# Patient Record
Sex: Male | Born: 1940 | Race: White | Hispanic: No | Marital: Single | Smoking: Never smoker
Health system: Southern US, Community
[De-identification: ages and names within clinical notes are randomized; demographics above are authoritative.]

## PROBLEM LIST (undated history)

## (undated) DIAGNOSIS — C4491 Basal cell carcinoma of skin, unspecified: Secondary | ICD-10-CM

## (undated) DIAGNOSIS — E039 Hypothyroidism, unspecified: Secondary | ICD-10-CM

## (undated) DIAGNOSIS — Z8589 Personal history of malignant neoplasm of other organs and systems: Secondary | ICD-10-CM

## (undated) DIAGNOSIS — E349 Endocrine disorder, unspecified: Secondary | ICD-10-CM

## (undated) DIAGNOSIS — E785 Hyperlipidemia, unspecified: Secondary | ICD-10-CM

## (undated) DIAGNOSIS — M542 Cervicalgia: Secondary | ICD-10-CM

## (undated) DIAGNOSIS — I4891 Unspecified atrial fibrillation: Secondary | ICD-10-CM

## (undated) DIAGNOSIS — I341 Nonrheumatic mitral (valve) prolapse: Secondary | ICD-10-CM

## (undated) DIAGNOSIS — R55 Syncope and collapse: Secondary | ICD-10-CM

## (undated) DIAGNOSIS — Z8679 Personal history of other diseases of the circulatory system: Secondary | ICD-10-CM

## (undated) DIAGNOSIS — I714 Abdominal aortic aneurysm, without rupture: Secondary | ICD-10-CM

## (undated) DIAGNOSIS — K219 Gastro-esophageal reflux disease without esophagitis: Secondary | ICD-10-CM

## (undated) DIAGNOSIS — F411 Generalized anxiety disorder: Secondary | ICD-10-CM

## (undated) DIAGNOSIS — R140 Abdominal distension (gaseous): Secondary | ICD-10-CM

## (undated) DIAGNOSIS — S129XXA Fracture of neck, unspecified, initial encounter: Secondary | ICD-10-CM

## (undated) DIAGNOSIS — H8110 Benign paroxysmal vertigo, unspecified ear: Secondary | ICD-10-CM

## (undated) DIAGNOSIS — C44529 Squamous cell carcinoma of skin of other part of trunk: Secondary | ICD-10-CM

## (undated) DIAGNOSIS — N529 Male erectile dysfunction, unspecified: Secondary | ICD-10-CM

## (undated) DIAGNOSIS — K5790 Diverticulosis of intestine, part unspecified, without perforation or abscess without bleeding: Secondary | ICD-10-CM

## (undated) DIAGNOSIS — Z0000 Encounter for general adult medical examination without abnormal findings: Secondary | ICD-10-CM

## (undated) DIAGNOSIS — I2510 Atherosclerotic heart disease of native coronary artery without angina pectoris: Secondary | ICD-10-CM

## (undated) DIAGNOSIS — K449 Diaphragmatic hernia without obstruction or gangrene: Secondary | ICD-10-CM

## (undated) HISTORY — DX: Personal history of other diseases of the circulatory system: Z86.79

## (undated) HISTORY — DX: Abdominal aortic aneurysm, without rupture: I71.4

## (undated) HISTORY — DX: Endocrine disorder, unspecified: E34.9

## (undated) HISTORY — DX: Abdominal distension (gaseous): R14.0

## (undated) HISTORY — PX: KNEE ARTHROPLASTY: SHX992

## (undated) HISTORY — DX: Generalized anxiety disorder: F41.1

## (undated) HISTORY — DX: Male erectile dysfunction, unspecified: N52.9

## (undated) HISTORY — DX: Encounter for general adult medical examination without abnormal findings: Z00.00

## (undated) HISTORY — DX: Syncope and collapse: R55

## (undated) HISTORY — DX: Benign paroxysmal vertigo, unspecified ear: H81.10

## (undated) HISTORY — DX: Cervicalgia: M54.2

## (undated) HISTORY — DX: Basal cell carcinoma of skin, unspecified: C44.91

## (undated) HISTORY — DX: Personal history of malignant neoplasm of other organs and systems: Z85.89

---
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
## 1978-06-20 DIAGNOSIS — S129XXA Fracture of neck, unspecified, initial encounter: Secondary | ICD-10-CM

## 1978-06-20 HISTORY — DX: Fracture of neck, unspecified, initial encounter: S12.9XXA

## 2005-10-28 ENCOUNTER — Emergency Department (HOSPITAL_COMMUNITY): Admission: EM | Admit: 2005-10-28 | Discharge: 2005-10-28 | Payer: Self-pay | Admitting: Family Medicine

## 2005-11-23 ENCOUNTER — Emergency Department (HOSPITAL_COMMUNITY): Admission: EM | Admit: 2005-11-23 | Discharge: 2005-11-23 | Payer: Self-pay | Admitting: Family Medicine

## 2006-03-27 ENCOUNTER — Emergency Department (HOSPITAL_COMMUNITY): Admission: EM | Admit: 2006-03-27 | Discharge: 2006-03-27 | Payer: Self-pay | Admitting: Emergency Medicine

## 2007-01-03 ENCOUNTER — Encounter: Payer: Self-pay | Admitting: Cardiovascular Disease

## 2007-01-08 ENCOUNTER — Encounter: Payer: Self-pay | Admitting: Cardiovascular Disease

## 2009-08-11 ENCOUNTER — Encounter: Payer: Self-pay | Admitting: Cardiovascular Disease

## 2010-07-21 HISTORY — PX: INGUINAL HERNIA REPAIR: SUR1180

## 2010-09-19 DIAGNOSIS — C44529 Squamous cell carcinoma of skin of other part of trunk: Secondary | ICD-10-CM

## 2010-09-19 HISTORY — DX: Squamous cell carcinoma of skin of other part of trunk: C44.529

## 2010-10-05 DIAGNOSIS — Z8589 Personal history of malignant neoplasm of other organs and systems: Secondary | ICD-10-CM

## 2010-10-05 HISTORY — DX: Personal history of malignant neoplasm of other organs and systems: Z85.89

## 2011-05-21 HISTORY — PX: APPENDECTOMY: SHX54

## 2011-07-12 DIAGNOSIS — M1710 Unilateral primary osteoarthritis, unspecified knee: Secondary | ICD-10-CM | POA: Diagnosis not present

## 2011-07-12 DIAGNOSIS — IMO0002 Reserved for concepts with insufficient information to code with codable children: Secondary | ICD-10-CM | POA: Diagnosis not present

## 2011-07-12 DIAGNOSIS — M9420 Chondromalacia, unspecified site: Secondary | ICD-10-CM | POA: Diagnosis not present

## 2011-07-20 DIAGNOSIS — IMO0002 Reserved for concepts with insufficient information to code with codable children: Secondary | ICD-10-CM | POA: Diagnosis not present

## 2011-07-20 DIAGNOSIS — M25569 Pain in unspecified knee: Secondary | ICD-10-CM | POA: Diagnosis not present

## 2011-07-20 DIAGNOSIS — M1710 Unilateral primary osteoarthritis, unspecified knee: Secondary | ICD-10-CM | POA: Diagnosis not present

## 2011-08-10 DIAGNOSIS — IMO0002 Reserved for concepts with insufficient information to code with codable children: Secondary | ICD-10-CM | POA: Diagnosis not present

## 2011-08-10 DIAGNOSIS — M1710 Unilateral primary osteoarthritis, unspecified knee: Secondary | ICD-10-CM | POA: Diagnosis not present

## 2011-08-17 DIAGNOSIS — IMO0002 Reserved for concepts with insufficient information to code with codable children: Secondary | ICD-10-CM | POA: Diagnosis not present

## 2011-08-17 DIAGNOSIS — M1710 Unilateral primary osteoarthritis, unspecified knee: Secondary | ICD-10-CM | POA: Diagnosis not present

## 2011-10-05 DIAGNOSIS — E7800 Pure hypercholesterolemia, unspecified: Secondary | ICD-10-CM | POA: Diagnosis not present

## 2011-10-05 DIAGNOSIS — R5381 Other malaise: Secondary | ICD-10-CM | POA: Diagnosis not present

## 2011-10-05 DIAGNOSIS — E039 Hypothyroidism, unspecified: Secondary | ICD-10-CM | POA: Diagnosis not present

## 2011-10-05 DIAGNOSIS — Z125 Encounter for screening for malignant neoplasm of prostate: Secondary | ICD-10-CM | POA: Diagnosis not present

## 2011-10-05 DIAGNOSIS — R5383 Other fatigue: Secondary | ICD-10-CM | POA: Diagnosis not present

## 2011-10-05 DIAGNOSIS — Z79899 Other long term (current) drug therapy: Secondary | ICD-10-CM | POA: Diagnosis not present

## 2011-10-05 DIAGNOSIS — Z1211 Encounter for screening for malignant neoplasm of colon: Secondary | ICD-10-CM | POA: Diagnosis not present

## 2011-10-18 DIAGNOSIS — K921 Melena: Secondary | ICD-10-CM | POA: Diagnosis not present

## 2011-10-18 DIAGNOSIS — K219 Gastro-esophageal reflux disease without esophagitis: Secondary | ICD-10-CM | POA: Diagnosis not present

## 2011-11-04 DIAGNOSIS — R51 Headache: Secondary | ICD-10-CM | POA: Diagnosis not present

## 2011-11-04 DIAGNOSIS — H2510 Age-related nuclear cataract, unspecified eye: Secondary | ICD-10-CM | POA: Diagnosis not present

## 2011-11-04 DIAGNOSIS — H43819 Vitreous degeneration, unspecified eye: Secondary | ICD-10-CM | POA: Diagnosis not present

## 2011-11-04 DIAGNOSIS — M316 Other giant cell arteritis: Secondary | ICD-10-CM | POA: Diagnosis not present

## 2011-11-04 DIAGNOSIS — H5710 Ocular pain, unspecified eye: Secondary | ICD-10-CM | POA: Diagnosis not present

## 2011-11-16 DIAGNOSIS — Z800 Family history of malignant neoplasm of digestive organs: Secondary | ICD-10-CM | POA: Diagnosis not present

## 2011-11-16 DIAGNOSIS — K648 Other hemorrhoids: Secondary | ICD-10-CM | POA: Diagnosis not present

## 2011-11-16 DIAGNOSIS — K5730 Diverticulosis of large intestine without perforation or abscess without bleeding: Secondary | ICD-10-CM | POA: Diagnosis not present

## 2011-11-16 DIAGNOSIS — E039 Hypothyroidism, unspecified: Secondary | ICD-10-CM | POA: Diagnosis not present

## 2011-11-16 DIAGNOSIS — K219 Gastro-esophageal reflux disease without esophagitis: Secondary | ICD-10-CM | POA: Diagnosis not present

## 2011-11-16 DIAGNOSIS — Z86010 Personal history of colonic polyps: Secondary | ICD-10-CM | POA: Diagnosis not present

## 2011-11-16 DIAGNOSIS — K921 Melena: Secondary | ICD-10-CM | POA: Diagnosis not present

## 2011-11-16 DIAGNOSIS — R195 Other fecal abnormalities: Secondary | ICD-10-CM | POA: Diagnosis not present

## 2011-11-22 DIAGNOSIS — Z0000 Encounter for general adult medical examination without abnormal findings: Secondary | ICD-10-CM | POA: Diagnosis not present

## 2011-11-22 DIAGNOSIS — R5381 Other malaise: Secondary | ICD-10-CM | POA: Diagnosis not present

## 2011-11-22 DIAGNOSIS — K219 Gastro-esophageal reflux disease without esophagitis: Secondary | ICD-10-CM | POA: Diagnosis not present

## 2011-11-22 DIAGNOSIS — R51 Headache: Secondary | ICD-10-CM | POA: Diagnosis not present

## 2011-11-22 DIAGNOSIS — E7800 Pure hypercholesterolemia, unspecified: Secondary | ICD-10-CM | POA: Diagnosis not present

## 2011-11-29 ENCOUNTER — Encounter: Payer: Self-pay | Admitting: Cardiovascular Disease

## 2011-11-29 DIAGNOSIS — R5383 Other fatigue: Secondary | ICD-10-CM | POA: Diagnosis not present

## 2011-11-29 DIAGNOSIS — R079 Chest pain, unspecified: Secondary | ICD-10-CM | POA: Diagnosis not present

## 2011-11-29 DIAGNOSIS — I4891 Unspecified atrial fibrillation: Secondary | ICD-10-CM | POA: Diagnosis not present

## 2011-11-29 DIAGNOSIS — R9439 Abnormal result of other cardiovascular function study: Secondary | ICD-10-CM | POA: Diagnosis not present

## 2011-11-29 DIAGNOSIS — K5730 Diverticulosis of large intestine without perforation or abscess without bleeding: Secondary | ICD-10-CM | POA: Diagnosis not present

## 2011-11-29 DIAGNOSIS — R5381 Other malaise: Secondary | ICD-10-CM | POA: Diagnosis not present

## 2011-11-29 DIAGNOSIS — K219 Gastro-esophageal reflux disease without esophagitis: Secondary | ICD-10-CM | POA: Diagnosis not present

## 2011-11-29 DIAGNOSIS — I2510 Atherosclerotic heart disease of native coronary artery without angina pectoris: Secondary | ICD-10-CM | POA: Diagnosis not present

## 2011-11-29 DIAGNOSIS — D509 Iron deficiency anemia, unspecified: Secondary | ICD-10-CM | POA: Diagnosis not present

## 2011-11-30 ENCOUNTER — Encounter: Payer: Self-pay | Admitting: Cardiovascular Disease

## 2011-11-30 DIAGNOSIS — R9431 Abnormal electrocardiogram [ECG] [EKG]: Secondary | ICD-10-CM | POA: Diagnosis not present

## 2011-11-30 DIAGNOSIS — I4891 Unspecified atrial fibrillation: Secondary | ICD-10-CM | POA: Diagnosis not present

## 2011-12-02 ENCOUNTER — Encounter: Payer: Self-pay | Admitting: Cardiovascular Disease

## 2011-12-02 DIAGNOSIS — I2510 Atherosclerotic heart disease of native coronary artery without angina pectoris: Secondary | ICD-10-CM | POA: Diagnosis not present

## 2011-12-02 DIAGNOSIS — R9439 Abnormal result of other cardiovascular function study: Secondary | ICD-10-CM | POA: Diagnosis not present

## 2011-12-02 DIAGNOSIS — I4891 Unspecified atrial fibrillation: Secondary | ICD-10-CM | POA: Diagnosis not present

## 2011-12-02 DIAGNOSIS — R079 Chest pain, unspecified: Secondary | ICD-10-CM | POA: Diagnosis not present

## 2011-12-03 DIAGNOSIS — I4891 Unspecified atrial fibrillation: Secondary | ICD-10-CM | POA: Diagnosis not present

## 2011-12-07 ENCOUNTER — Encounter: Payer: Self-pay | Admitting: Cardiovascular Disease

## 2011-12-07 DIAGNOSIS — I4891 Unspecified atrial fibrillation: Secondary | ICD-10-CM | POA: Diagnosis not present

## 2011-12-07 DIAGNOSIS — I2510 Atherosclerotic heart disease of native coronary artery without angina pectoris: Secondary | ICD-10-CM | POA: Diagnosis not present

## 2011-12-09 ENCOUNTER — Encounter: Payer: Self-pay | Admitting: Cardiovascular Disease

## 2011-12-12 DIAGNOSIS — I4891 Unspecified atrial fibrillation: Secondary | ICD-10-CM | POA: Diagnosis not present

## 2011-12-30 DIAGNOSIS — R079 Chest pain, unspecified: Secondary | ICD-10-CM | POA: Diagnosis not present

## 2011-12-30 DIAGNOSIS — R002 Palpitations: Secondary | ICD-10-CM | POA: Diagnosis not present

## 2012-01-02 ENCOUNTER — Encounter (HOSPITAL_COMMUNITY): Payer: Self-pay | Admitting: Emergency Medicine

## 2012-01-02 ENCOUNTER — Observation Stay (HOSPITAL_COMMUNITY)
Admission: EM | Admit: 2012-01-02 | Discharge: 2012-01-03 | Disposition: A | Discharge status: Home / Self Care | Payer: Medicare Other | Attending: Cardiovascular Disease | Admitting: Cardiovascular Disease

## 2012-01-02 ENCOUNTER — Encounter: Payer: Self-pay | Admitting: Cardiovascular Disease

## 2012-01-02 ENCOUNTER — Emergency Department (HOSPITAL_COMMUNITY): Payer: Medicare Other

## 2012-01-02 ENCOUNTER — Other Ambulatory Visit: Payer: Self-pay

## 2012-01-02 DIAGNOSIS — E785 Hyperlipidemia, unspecified: Secondary | ICD-10-CM | POA: Diagnosis not present

## 2012-01-02 DIAGNOSIS — K219 Gastro-esophageal reflux disease without esophagitis: Secondary | ICD-10-CM

## 2012-01-02 DIAGNOSIS — R0602 Shortness of breath: Secondary | ICD-10-CM | POA: Diagnosis not present

## 2012-01-02 DIAGNOSIS — I4891 Unspecified atrial fibrillation: Secondary | ICD-10-CM | POA: Diagnosis not present

## 2012-01-02 DIAGNOSIS — I059 Rheumatic mitral valve disease, unspecified: Secondary | ICD-10-CM | POA: Diagnosis not present

## 2012-01-02 DIAGNOSIS — Z8679 Personal history of other diseases of the circulatory system: Secondary | ICD-10-CM | POA: Insufficient documentation

## 2012-01-02 DIAGNOSIS — E039 Hypothyroidism, unspecified: Secondary | ICD-10-CM | POA: Diagnosis not present

## 2012-01-02 DIAGNOSIS — R079 Chest pain, unspecified: Principal | ICD-10-CM

## 2012-01-02 DIAGNOSIS — K5792 Diverticulitis of intestine, part unspecified, without perforation or abscess without bleeding: Secondary | ICD-10-CM | POA: Insufficient documentation

## 2012-01-02 DIAGNOSIS — I341 Nonrheumatic mitral (valve) prolapse: Secondary | ICD-10-CM | POA: Insufficient documentation

## 2012-01-02 DIAGNOSIS — R0789 Other chest pain: Secondary | ICD-10-CM | POA: Diagnosis not present

## 2012-01-02 DIAGNOSIS — I2510 Atherosclerotic heart disease of native coronary artery without angina pectoris: Secondary | ICD-10-CM

## 2012-01-02 HISTORY — DX: Diaphragmatic hernia without obstruction or gangrene: K44.9

## 2012-01-02 HISTORY — DX: Nonrheumatic mitral (valve) prolapse: I34.1

## 2012-01-02 HISTORY — DX: Hyperlipidemia, unspecified: E78.5

## 2012-01-02 HISTORY — DX: Atherosclerotic heart disease of native coronary artery without angina pectoris: I25.10

## 2012-01-02 HISTORY — DX: Diverticulosis of intestine, part unspecified, without perforation or abscess without bleeding: K57.90

## 2012-01-02 HISTORY — DX: Unspecified atrial fibrillation: I48.91

## 2012-01-02 HISTORY — DX: Fracture of neck, unspecified, initial encounter: S12.9XXA

## 2012-01-02 HISTORY — DX: Squamous cell carcinoma of skin of other part of trunk: C44.529

## 2012-01-02 HISTORY — DX: Gastro-esophageal reflux disease without esophagitis: K21.9

## 2012-01-02 HISTORY — DX: Hypothyroidism, unspecified: E03.9

## 2012-01-02 LAB — CBC
HCT: 38.1 % — ABNORMAL LOW (ref 39.0–52.0)
Hemoglobin: 16.3 g/dL (ref 13.0–17.0)
MCH: 32.4 pg (ref 26.0–34.0)
MCHC: 36.8 g/dL — ABNORMAL HIGH (ref 30.0–36.0)
MCHC: 36.5 g/dL — ABNORMAL HIGH (ref 30.0–36.0)
MCV: 88.8 fL (ref 78.0–100.0)
Platelets: 206 10*3/uL (ref 150–400)
Platelets: 190 10*3/uL (ref 150–400)
RDW: 13.2 % (ref 11.5–15.5)
WBC: 6.5 10*3/uL (ref 4.0–10.5)

## 2012-01-02 LAB — POCT I-STAT TROPONIN I: Troponin i, poc: 0 ng/mL (ref 0.00–0.08)

## 2012-01-02 LAB — PROTIME-INR
INR: 1.07 (ref 0.00–1.49)
Prothrombin Time: 14.1 seconds (ref 11.6–15.2)

## 2012-01-02 LAB — CARDIAC PANEL(CRET KIN+CKTOT+MB+TROPI)
Relative Index: 1.8 (ref 0.0–2.5)
Total CK: 160 U/L (ref 7–232)

## 2012-01-02 LAB — BASIC METABOLIC PANEL
GFR calc Af Amer: >90 mL/min (ref >90)
GFR calc non Af Amer: 84 mL/min — ABNORMAL LOW (ref >90)
Glucose, Bld: 109 mg/dL — ABNORMAL HIGH (ref 70–99)
Potassium: 3.8 mEq/L (ref 3.5–5.1)
Sodium: 139 mEq/L (ref 135–145)

## 2012-01-02 MED ORDER — LEVOTHYROXINE SODIUM 100 MCG PO TABS
100.0000 ug | ORAL_TABLET | Freq: Every day | ORAL | Status: DC
  Administered 2012-01-03: 100 ug via ORAL
  Filled 2012-01-02 (×2): qty 1

## 2012-01-02 MED ORDER — IRON 325 (65 FE) MG PO TABS: 1.0000 | ORAL_TABLET | Freq: Every day | ORAL | Status: DC

## 2012-01-02 MED ORDER — DUTASTERIDE 0.5 MG PO CAPS
0.5000 mg | ORAL_CAPSULE | Freq: Every day | ORAL | Status: DC
  Administered 2012-01-02 – 2012-01-03 (×2): 0.5 mg via ORAL
  Filled 2012-01-02 (×2): qty 1

## 2012-01-02 MED ORDER — VITAMIN B COMPLEX-C PO CAPS
1.0000 | ORAL_CAPSULE | Freq: Every day | ORAL | Status: DC
  Administered 2012-01-02: 1 via ORAL
  Filled 2012-01-02 (×4): qty 1

## 2012-01-02 MED ORDER — THERA M PLUS PO TABS: 1.0000 | ORAL_TABLET | Freq: Every day | ORAL | Status: DC

## 2012-01-02 MED ORDER — ACETAMINOPHEN 325 MG PO TABS: 650.0000 mg | ORAL_TABLET | ORAL | Status: DC | PRN

## 2012-01-02 MED ORDER — SODIUM CHLORIDE 0.9 % IV SOLN: 250.0000 mL | INTRAVENOUS | Status: DC | PRN

## 2012-01-02 MED ORDER — DUTASTERIDE-TAMSULOSIN HCL 0.5-0.4 MG PO CAPS: 1.0000 | ORAL_CAPSULE | Freq: Every day | ORAL | Status: DC

## 2012-01-02 MED ORDER — SODIUM CHLORIDE 0.9 % IV SOLN
INTRAVENOUS | Status: DC
  Administered 2012-01-03: 04:00:00 via INTRAVENOUS

## 2012-01-02 MED ORDER — TAMSULOSIN HCL 0.4 MG PO CAPS
0.4000 mg | ORAL_CAPSULE | Freq: Every day | ORAL | Status: DC
  Administered 2012-01-02 – 2012-01-03 (×2): 0.4 mg via ORAL
  Filled 2012-01-02 (×2): qty 1

## 2012-01-02 MED ORDER — NITROGLYCERIN 0.4 MG SL SUBL
0.4000 mg | SUBLINGUAL_TABLET | SUBLINGUAL | Status: DC | PRN
  Administered 2012-01-02 (×3): 0.4 mg via SUBLINGUAL

## 2012-01-02 MED ORDER — MIRTAZAPINE 15 MG PO TABS
15.0000 mg | ORAL_TABLET | Freq: Every day | ORAL | Status: DC
  Administered 2012-01-02: 15 mg via ORAL
  Filled 2012-01-02 (×2): qty 1

## 2012-01-02 MED ORDER — ZOLPIDEM TARTRATE 5 MG PO TABS
10.0000 mg | ORAL_TABLET | Freq: Every evening | ORAL | Status: DC | PRN
  Administered 2012-01-02: 10 mg via ORAL
  Filled 2012-01-02 (×2): qty 1

## 2012-01-02 MED ORDER — ONDANSETRON HCL 4 MG/2ML IJ SOLN: 4.0000 mg | Freq: Four times a day (QID) | INTRAMUSCULAR | Status: DC | PRN

## 2012-01-02 MED ORDER — ASPIRIN 81 MG PO CHEW: 81.0000 mg | CHEWABLE_TABLET | Freq: Every day | ORAL | Status: DC

## 2012-01-02 MED ORDER — HEPARIN SODIUM (PORCINE) 5000 UNIT/ML IJ SOLN
5000.0000 [IU] | Freq: Three times a day (TID) | INTRAMUSCULAR | Status: DC
  Administered 2012-01-02 – 2012-01-03 (×2): 5000 [IU] via SUBCUTANEOUS
  Filled 2012-01-02 (×5): qty 1

## 2012-01-02 MED ORDER — SODIUM CHLORIDE 0.9 % IJ SOLN: 3.0000 mL | INTRAMUSCULAR | Status: DC | PRN

## 2012-01-02 MED ORDER — ASPIRIN 81 MG PO CHEW
324.0000 mg | CHEWABLE_TABLET | ORAL | Status: AC
  Administered 2012-01-03: 324 mg via ORAL
  Filled 2012-01-02: qty 4

## 2012-01-02 MED ORDER — DIAZEPAM 5 MG PO TABS
5.0000 mg | ORAL_TABLET | ORAL | Status: AC
  Administered 2012-01-03: 5 mg via ORAL
  Filled 2012-01-02: qty 1

## 2012-01-02 MED ORDER — FERROUS SULFATE 325 (65 FE) MG PO TABS
325.0000 mg | ORAL_TABLET | Freq: Every day | ORAL | Status: DC
  Administered 2012-01-03: 325 mg via ORAL
  Filled 2012-01-02: qty 1

## 2012-01-02 MED ORDER — ADULT MULTIVITAMIN W/MINERALS CH
1.0000 | ORAL_TABLET | Freq: Every day | ORAL | Status: DC
  Administered 2012-01-03: 1 via ORAL
  Filled 2012-01-02: qty 1

## 2012-01-02 MED ORDER — SIMVASTATIN 10 MG PO TABS
10.0000 mg | ORAL_TABLET | Freq: Every day | ORAL | Status: DC
  Administered 2012-01-02: 10 mg via ORAL
  Filled 2012-01-02 (×2): qty 1

## 2012-01-02 MED ORDER — SODIUM CHLORIDE 0.9 % IJ SOLN
3.0000 mL | Freq: Two times a day (BID) | INTRAMUSCULAR | Status: DC
  Administered 2012-01-02 – 2012-01-03 (×2): 3 mL via INTRAVENOUS

## 2012-01-02 MED ORDER — PANTOPRAZOLE SODIUM 40 MG PO TBEC
80.0000 mg | DELAYED_RELEASE_TABLET | Freq: Every day | ORAL | Status: DC
  Administered 2012-01-03: 80 mg via ORAL
  Filled 2012-01-02: qty 2

## 2012-01-02 NOTE — ED Notes (Signed)
Pt reports 0/10 CP.

## 2012-01-02 NOTE — H&P (Signed)
History and Physical  Patient ID: James [name] MRN: [id number], SOB: [birth year] 71 y.o. Date of Encounter: 01/02/2012, 3:08 PM  Primary Physician: Dr. Cristopher Estimable in [place], [place] Primary Cardiologist: Dr. Louanne Skye in [place], [place]  Chief Complaint: Chest pain  HPI: 71 y.o. male w/ PMHx significant for Nonobstructive CAD, ?Atrial Fibrillation, HLD, Hypothyroidism, and GERD who presented to [facility] on 01/02/2012 with complaints of chest pain.  Records obtained from patient and provider in [place]:  * Cardiac Cath 2008 revealed 40% prox LAD stenosis, FFR 0.95 suggesting none to minimal obstruction * Stress Myoview 08/05/10 revealed normal perfusion images, no evidence of ischemia or scar, EF 61%.  * Echo 12/02/11 revealed nl LV function, EF >55%, no WMAs.  * Cardiac CTA 12/02/11 revealed LAD dz (ostial narrowing 50-70%, prox LAD 50% narrowing, <25% narrowing at septal perforator, 50-75% stenosis of first diagonal ostially), LM with 10-20% narrowing distally, LCx & RCA free of dz, nl LV fxn EF 60%. * Stress Myoview 11/2011 - ? atrial fibrillation, no ischemia * Event monitor 11/2011 Sinus tach, PVCs  The patient is from [place], but is in the process of moving to [place]. He reports being an avid runner for many years including running long distance races. He has noticed a decline in running performance for several months due to sob and chest pain. Feels as though he is running through water. Over the last month he has had chest pain and sob every time he runs and over the last two weeks he has had chest pain daily with and without exertion. He describes the pain as dull, 1-5/10, nonradiating, intermittent. No associated diaphoresis, nausea, dizziness, palpitations, or syncope. Not pleuritic in nature. No correlation with eating. No orthopnea, PND, edema, abd pain, change in bladder/bowel habits, melena (dark stool, takes Fe, but no change in color), hematochezia, fever, chills,  recent illness. Recently (7/10 thru 7/13) drove 9 hrs to [place] and 9 hrs back to [facility] stopped every couple of hours to walk. No calf pain. He reports having an exercise myoview last month that did not show any ischemia, but someone noted ? Of atrial fibrillation on the rhythm strips. His cardiologist ordered an event monitor that only showed sinus tachycardia and PVCs. This morning his sister called "a friend of a friend" who contacted Dr. Graciela Husbands in order to request an appointment due to increased frequency of chest pain and he was told to come to the hospital for evaluation.   EKG revealed sinus bradycardia with no acute ST/T changes. CXR was without acute cardiopulmonary abnormalities. Labs are significant for normal poc troponin, unremarkable BMET/CBC.   Past Medical History  Diagnosis Date  . CAD (coronary artery disease)     nonobstructive by cath 2008; nonobstructive by Cardiac CT 11/2011  . Hypothyroidism   . Atrial fibrillation   . Diverticulosis   . Mitral valve prolapse   . HLD (hyperlipidemia)   . GERD (gastroesophageal reflux disease)   . Hiatal hernia   . Cervical spine fracture 1980    C6-7  . 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 Appendectomy 05/2011  . Inguinal hernia repair 07/2010    right  . Knee arthroplasty     bilat     Home Meds: Medication Sig  aspirin 81 MG tablet Take 81 mg by mouth daily.  Calcium Carb-Cholecalciferol (CALCIUM 1000 + D PO) Take 1 tablet by mouth  daily.  Dutasteride-Tamsulosin HCl (JALYN) 0.5-0.4 MG CAPS Take 1 capsule by mouth daily.  esomeprazole (NEXIUM) 40 MG capsule Take 40 mg by mouth daily before breakfast.  Ferrous Sulfate (IRON) 325 (65 FE) MG TABS Take 1 tablet by mouth daily.  levothyroxine (SYNTHROID, LEVOTHROID) 100 MCG tablet Take 100 mcg by mouth daily.  mirtazapine (REMERON) 15 MG tablet Take 15 mg by mouth at bedtime.  Multiple Vitamins-Minerals  (MULTIVITAMINS THER. W/MINERALS) TABS Take 1 tablet by mouth daily.  pravastatin (PRAVACHOL) 20 MG tablet Take 20 mg by mouth daily.  PRESCRIPTION MEDICATION Apply 1 application topically every morning. axiron 60mg - deodorant  Vitamin B Complex-C CAPS Take 1 capsule by mouth daily.  zolpidem (AMBIEN) 10 MG tablet Take 10 mg by mouth at bedtime as needed. For sleep    Allergies:  Allergies  Allergen Reactions  . Sulfa Antibiotics Other (See Comments)    Childhood allergy    History   Social History  . Marital Status: Divorced    Spouse Name: N/A    Number of Children: N/A  . Years of Education: N/A   Occupational History  . Art gallery manager     Retired   Social History Main Topics  . Smoking status: Never Smoker   . Smokeless tobacco: Not on file  . Alcohol Use: Yes     2 glasses red wine/day  . Drug Use: No  . Sexually Active: Not on file   Other Topics Concern  . Not on file   Social History Narrative  . No narrative on file    Family History  Problem Relation Age of Onset  . Heart disease Mother     MI 68s  . Heart disease Maternal Grandfather     MI 70-80s  . Heart disease Maternal Grandmother     MI 70-80s    Review of Systems: General: negative for chills, fever, night sweats or weight changes.  Cardiovascular: As per HPI Dermatological: negative for rash Respiratory: negative for cough or wheezing Urologic: negative for hematuria Abdominal: negative for nausea, vomiting, diarrhea, bright red blood per rectum, melena, or hematemesis Neurologic: negative for visual changes, syncope, or dizziness All other systems reviewed and are otherwise negative except as noted above.  Labs:   Lab Results  Component Value Date   WBC 6.5 01/02/2012   HGB 16.3 01/02/2012   HCT 44.3 01/02/2012   MCV 89.1 01/02/2012   PLT 206 01/02/2012     01/02/2012 13:31  Prothrombin Time 14.1  INR 1.07    01/02/2012 13:47  Troponin i, poc 0.00    Lab 01/02/12 1331  NA 139  K 3.8   CL 100  CO2 28  BUN 13  CREATININE 0.87  CALCIUM 9.7  GLUCOSE 109*   Radiology/Studies:   01/02/2012 - Chest 2 View Findings: The lungs are clear.  Mediastinal contours appear normal. The heart is within normal limits in size.  No bony abnormality is seen.  IMPRESSION: No active lung disease.      EKG: 01/02/12 @ 1300 - sinus bradycardia 52bpm, no acute ST/T changes  Physical Exam: Blood pressure 132/83, pulse 53, temperature 97.9 F (36.6 C), temperature source Oral, resp. rate 14, SpO2 99.00%. General: Well developed, well nourished, in no acute distress. Head: Normocephalic, atraumatic, sclera non-icteric, nares are without discharge Neck: Supple. Negative for carotid bruits. JVD not elevated. Lungs: Clear bilaterally to auscultation without wheezes, rales, or rhonchi. Breathing is unlabored. Heart: RRR with S1 S2. No murmurs, rubs, or gallops appreciated. Abdomen:  Soft, non-tender, non-distended with normoactive bowel sounds. No rebound/guarding. No obvious abdominal masses. Msk:  Strength and tone appear normal for age. Extremities: No edema. No clubbing or cyanosis. Distal pedal pulses are 2+ and equal bilaterally. Neuro: Alert and oriented X 3. Moves all extremities spontaneously. Psych:  Responds to questions appropriately with a normal affect.    ASSESSMENT AND PLAN:  71 y.o. male w/ PMHx significant for Nonobstructive CAD, ?Atrial Fibrillation, HLD, Hypothyroidism, and GERD who presented to [facility] on 01/02/2012 with complaints of chest pain.  1. Chest pain: Patient has a history of nonobstructive CAD by cath in 2008 and by Cardiac CT last month. He has had increased frequency of exertional and now nonexertional chest pain and sob over the last 2-4wks that has limited his ability to run. Poc troponin normal, EKG nonacute, CXR unremarkable. Unsure if his chest pain and sob are due to flow limiting lesions. It is possible, but unlikely that he is having paroxysmal  atrial fibrillation. He denies palpitations, but has a sense of fluttering occasionally and had questionable a.fib during stress test, although only sinus tach and PVCs noted on event monitor.  Low suspicion for PE as he is not tachycardia or hypoxic, has no LE edema or calf pain, and chest pain is nonpleuritic. As he is having ongoing chest pain will admit, cycle enzymes, monitor on tele, and plan for cath tomorrow. Cont ASA and statin. No heparin unless he rules in. If cardiac cath unremarkable, would need outpatient pulmonary evaluation.  2. HLD: Check lipid panel and LFTs. Cont statin.  Signed, HOPE, JESSICA PA-C 01/02/2012, 3:08 PM  Attending Note:   The patient was seen and examined.  Agree with assessment and plan as noted above.  Very pleasant gentleman from [place], [place].  Admitted with progressive dyspnea and CP - both with exertion and now at rest.  Pt is an avid runner - 50 miles a week.  Recently has decreased it to 15 miles a week.  He has noticed dyspnea and chest pressure  - initially noticed with while running and now it has progressed such that he has a fullness at rest.    He has had an extensive noninvasive workup - Coronary calcium score 242.   CT angio showed moderate dz of the LM and LAD.  Normal stress myoview ( according to patient)  in June.  Cath in 2008 showed mild - moderate disease.  Exam - bradycardic , otherwise normal.  Imp:  I think he needs a cath .  He is clearly symptomatic and has known moderate disease.  i do not think a myoview will help [place].    If the cath shows no significant problems, he will need a pulmonary consult.   Check TSH. Check lipids - cont . Statin.   Vesta Mixer, Montez Hageman., MD, [facility] 01/02/2012, 4:18 PM

## 2012-01-02 NOTE — ED Notes (Signed)
In to assess pt.  Pt not in room at this time.

## 2012-01-02 NOTE — ED Provider Notes (Signed)
History     CSN: [id number]  Arrival date & time 01/02/12  1307   First MD Initiated Contact with Patient 01/02/12 1459      Chief Complaint  Patient presents with  . Chest Pain  . Shortness of Breath     HPI The patient presents with concerns of ongoing chest pain and dyspnea.  He notes that over the past 2 weeks episodes have become more frequent.  There is some unpredictability to the episodes, running and seems to trigger events.  Symptoms seem to ease spontaneously with time.  The chest pain is anterior, tightness, diffuse. He denies concurrent lightheadedness, nausea, vomiting, distal extremity dysesthesia. Notably, the patient was seen in [place] for his complaints one week ago, he had a cardiac monitor device placed and was instructed that he needs a catheterization.  The most recent verbal report by the cardiologist was in his cardiac monitor demonstrated arrhythmia, though no clear atrial fibrillation. Past Medical History  Diagnosis Date  . CAD (coronary artery disease)     nonobstructive by cath 2008; nonobstructive by Cardiac CT 11/2011  . Hypothyroidism   . Atrial fibrillation   . Diverticulosis   . Mitral valve prolapse   . HLD (hyperlipidemia)   . GERD (gastroesophageal reflux disease)   . Hiatal hernia   . Cervical spine fracture 1980    C6-7  . 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 Appendectomy 05/2011  . Inguinal hernia repair 07/2010    right  . Knee arthroplasty     bilat    Family History  Problem Relation Age of Onset  . Heart disease Mother     MI 79s  . Heart disease Maternal Grandfather     MI 70-80s  . Heart disease Maternal Grandmother     MI 70-80s    History  Substance Use Topics  . Smoking status: Never Smoker   . Smokeless tobacco: Not on file  . Alcohol Use: Yes     2 glasses red wine/day      Review of Systems  Constitutional:       Per HPI, otherwise  negative  HENT:       Per HPI, otherwise negative  Eyes: Negative.   Respiratory:       Per HPI, otherwise negative  Cardiovascular:       Per HPI, otherwise negative  Gastrointestinal: Negative for vomiting.  Genitourinary: Negative.   Musculoskeletal:       Per HPI, otherwise negative  Skin: Negative.   Neurological: Negative for syncope.    Allergies  Sulfa antibiotics  Home Medications   Current Outpatient Rx  Name Route Sig Dispense Refill  . ASPIRIN 81 MG PO TABS Oral Take 81 mg by mouth daily.    Marland Kitchen CALCIUM 1000 + D PO Oral Take 1 tablet by mouth daily.    . DUTASTERIDE-TAMSULOSIN HCL 0.5-0.4 MG PO CAPS Oral Take 1 capsule by mouth daily.    Marland Kitchen ESOMEPRAZOLE MAGNESIUM 40 MG PO CPDR Oral Take 40 mg by mouth daily before breakfast.    . IRON 325 (65 FE) MG PO TABS Oral Take 1 tablet by mouth daily.    Marland Kitchen LEVOTHYROXINE SODIUM 100 MCG PO TABS Oral Take 100 mcg by mouth daily.    Marland Kitchen MIRTAZAPINE 15 MG PO TABS Oral Take 15 mg by mouth at bedtime.    Carma Leaven M PLUS PO TABS Oral  Take 1 tablet by mouth daily.    Marland Kitchen PRAVASTATIN SODIUM 20 MG PO TABS Oral Take 20 mg by mouth daily.    Marland Kitchen PRESCRIPTION MEDICATION Topical Apply 1 application topically every morning. axiron 60mg - deodorant    . VITAMIN B COMPLEX-C PO CAPS Oral Take 1 capsule by mouth daily.    Marland Kitchen ZOLPIDEM TARTRATE 10 MG PO TABS Oral Take 10 mg by mouth at bedtime as needed. For sleep      BP 139/84  Pulse 51  Temp 97.9 F (36.6 C) (Oral)  Resp 13  SpO2 100%  Physical Exam  Nursing note and vitals reviewed. Constitutional: He is oriented to person, place, and time. He appears well-developed. No distress.  HENT:  Head: Normocephalic and atraumatic.  Eyes: Conjunctivae and EOM are normal.  Cardiovascular: Normal rate and regular rhythm.   Pulmonary/Chest: Effort normal. No stridor. No respiratory distress.  Abdominal: He exhibits no distension.  Musculoskeletal: He exhibits no edema.  Neurological: He is alert and  oriented to person, place, and time.  Skin: Skin is warm and dry.  Psychiatric: He has a normal mood and affect.    ED Course  Procedures (including critical care time)  Labs Reviewed  CBC - Abnormal; Notable for the following:    MCHC 36.8 (*)     All other components within normal limits  BASIC METABOLIC PANEL - Abnormal; Notable for the following:    Glucose, Bld 109 (*)     GFR calc non Af Amer 84 (*)     All other components within normal limits  PROTIME-INR  POCT I-STAT TROPONIN I   Dg Chest 2 View  01/02/2012  *RADIOLOGY REPORT*  Clinical Data: Intermittent chest pain for several weeks, history of atrial fibrillation.  CHEST - 2 VIEW  Comparison: None.  Findings: The lungs are clear.  Mediastinal contours appear normal. The heart is within normal limits in size.  No bony abnormality is seen.  IMPRESSION: No active lung disease.  Original Report Authenticated By: Juline Patch, M.D.     No diagnosis found.  Cardiac 50 sinus bradycardia abnormal Pulse ox 100% room air normal   Date: 01/02/2012  Rate: 52  Rhythm: sinus bradycardia  QRS Axis: normal  Intervals: normal  ST/T Wave abnormalities: nonspecific T wave changes  Conduction Disutrbances:none  Narrative Interpretation:   Old EKG Reviewed: none available BORDERLINE   MDM  This 71 year old male who is in generally well now presents with 2 weeks of increasingly frequent chest pain and dyspnea.  Notably, the patient's symptoms seem to occur most frequently of exertion.  On my exam the patient is in no distress.  The patient has no notable dysrhythmia on exam, but he is bradycardic.  Given the patient's history of being an avid runner, this seems appropriate.  Given the new exertional chest pain and dyspnea the patient will be admitted for further evaluation and management.      Gerhard Munch, MD 01/02/12 [phone]

## 2012-01-02 NOTE — ED Notes (Signed)
Pt c/o intermittent midsternal CP and SOB x 2 weeks; pt sts was seen in [place] by cards last week and told he needed a cath

## 2012-01-02 NOTE — ED Notes (Signed)
Patient transported to X-ray 

## 2012-01-03 ENCOUNTER — Encounter (HOSPITAL_COMMUNITY)
Admission: EM | Disposition: A | Discharge status: Home / Self Care | Payer: Self-pay | Source: Home / Self Care | Attending: Emergency Medicine

## 2012-01-03 ENCOUNTER — Ambulatory Visit (HOSPITAL_COMMUNITY): Admit: 2012-01-03 | Payer: Self-pay | Admitting: Cardiovascular Disease

## 2012-01-03 ENCOUNTER — Encounter (HOSPITAL_COMMUNITY): Payer: Self-pay | Admitting: Physician Assistant

## 2012-01-03 DIAGNOSIS — E039 Hypothyroidism, unspecified: Secondary | ICD-10-CM | POA: Diagnosis not present

## 2012-01-03 DIAGNOSIS — I2510 Atherosclerotic heart disease of native coronary artery without angina pectoris: Secondary | ICD-10-CM

## 2012-01-03 DIAGNOSIS — K219 Gastro-esophageal reflux disease without esophagitis: Secondary | ICD-10-CM

## 2012-01-03 DIAGNOSIS — R079 Chest pain, unspecified: Secondary | ICD-10-CM | POA: Diagnosis not present

## 2012-01-03 DIAGNOSIS — E785 Hyperlipidemia, unspecified: Secondary | ICD-10-CM

## 2012-01-03 DIAGNOSIS — R0602 Shortness of breath: Secondary | ICD-10-CM | POA: Diagnosis not present

## 2012-01-03 DIAGNOSIS — I059 Rheumatic mitral valve disease, unspecified: Secondary | ICD-10-CM | POA: Diagnosis not present

## 2012-01-03 HISTORY — PX: CARDIAC CATHETERIZATION: SHX172

## 2012-01-03 HISTORY — PX: LEFT HEART CATHETERIZATION WITH CORONARY ANGIOGRAM: SHX5451

## 2012-01-03 LAB — CBC
Hemoglobin: 13.7 g/dL (ref 13.0–17.0)
MCH: 31.5 pg (ref 26.0–34.0)
MCHC: 35.4 g/dL (ref 30.0–36.0)
MCV: 89 fL (ref 78.0–100.0)
RBC: 4.35 MIL/uL (ref 4.22–5.81)

## 2012-01-03 LAB — CARDIAC PANEL(CRET KIN+CKTOT+MB+TROPI)
Relative Index: 1.7 (ref 0.0–2.5)
Relative Index: 1.8 (ref 0.0–2.5)
Total CK: 127 U/L (ref 7–232)
Total CK: 152 U/L (ref 7–232)
Troponin I: <0.3 ng/mL (ref <0.30)

## 2012-01-03 LAB — COMPREHENSIVE METABOLIC PANEL
Albumin: 3.6 g/dL (ref 3.5–5.2)
BUN: 13 mg/dL (ref 6–23)
Creatinine, Ser: 0.86 mg/dL (ref 0.50–1.35)
Total Bilirubin: 0.5 mg/dL (ref 0.3–1.2)
Total Protein: 6.7 g/dL (ref 6.0–8.3)

## 2012-01-03 LAB — LIPID PANEL
Cholesterol: 148 mg/dL (ref 0–200)
Total CHOL/HDL Ratio: 2.4 RATIO
Triglycerides: 67 mg/dL (ref <150)
VLDL: 13 mg/dL (ref 0–40)

## 2012-01-03 SURGERY — LEFT HEART CATHETERIZATION WITH CORONARY ANGIOGRAM
Anesthesia: LOCAL

## 2012-01-03 MED ORDER — B COMPLEX-C PO TABS
1.0000 | ORAL_TABLET | Freq: Every day | ORAL | Status: DC
  Administered 2012-01-03: 1 via ORAL
  Filled 2012-01-03: qty 1

## 2012-01-03 MED ORDER — FENTANYL CITRATE 0.05 MG/ML IJ SOLN
INTRAMUSCULAR | Status: AC
  Filled 2012-01-03: qty 2

## 2012-01-03 MED ORDER — ONDANSETRON HCL 4 MG/2ML IJ SOLN: 4.0000 mg | Freq: Four times a day (QID) | INTRAMUSCULAR | Status: DC | PRN

## 2012-01-03 MED ORDER — NITROGLYCERIN 0.2 MG/ML ON CALL CATH LAB
INTRAVENOUS | Status: AC
  Filled 2012-01-03: qty 1

## 2012-01-03 MED ORDER — LIDOCAINE HCL (PF) 1 % IJ SOLN
INTRAMUSCULAR | Status: AC
  Filled 2012-01-03: qty 30

## 2012-01-03 MED ORDER — ACETAMINOPHEN 325 MG PO TABS: 650.0000 mg | ORAL_TABLET | ORAL | Status: DC | PRN

## 2012-01-03 MED ORDER — NITROGLYCERIN 0.4 MG SL SUBL: 0.4000 mg | SUBLINGUAL_TABLET | SUBLINGUAL | Status: DC | PRN

## 2012-01-03 MED ORDER — SODIUM CHLORIDE 0.9 % IV SOLN
INTRAVENOUS | Status: DC
  Administered 2012-01-03: 13:00:00 via INTRAVENOUS

## 2012-01-03 MED ORDER — HEPARIN (PORCINE) IN NACL 2-0.9 UNIT/ML-% IJ SOLN
INTRAMUSCULAR | Status: AC
  Filled 2012-01-03: qty 2000

## 2012-01-03 MED ORDER — MIDAZOLAM HCL 2 MG/2ML IJ SOLN
INTRAMUSCULAR | Status: AC
  Filled 2012-01-03: qty 2

## 2012-01-03 NOTE — Interval H&P Note (Signed)
History and Physical Interval Note:  01/03/2012 11:15 AM  James [name]  has presented today for surgery, with the diagnosis of r/o CAD  The various methods of treatment have been discussed with the patient and family. After consideration of risks, benefits and other options for treatment, the patient has consented to  Procedure(s) (LRB): LEFT HEART CATHETERIZATION WITH CORONARY ANGIOGRAM (N/A) as a surgical intervention .  The patient's history has been reviewed, patient examined, no change in status, stable for surgery.  I have reviewed the patients' chart and labs.  Questions were answered to the patient's satisfaction.     Elyn Aquas.

## 2012-01-03 NOTE — Progress Notes (Signed)
Utilization review complete 

## 2012-01-03 NOTE — CV Procedure (Signed)
    Cardiac Cath Note  James [name] [id number] [birth year]  Procedure: Left  Heart Cardiac Catheterization Note Indications:  Chest pain, dyspnea  Procedure Details Consent: Obtained Time Out: Verified patient identification, verified procedure, site/side was marked, verified correct patient position, special equipment/implants available, Radiology Safety Procedures followed,  medications/allergies/relevent history reviewed, required imaging and test results available.  Performed   Medications: Fentanyl: 50 mcg IV Versed: 2 mg IV  The right femoral artery was easily canulated using a modified Seldinger technique.  Hemodynamics:   LV pressure: 112/8 Aortic pressure: 110/55  Angiography   Left Main:  Smooth and normal  Left anterior Descending: mild calcification.  There is a 30% stenosis at the origen of the LAD.  The remaining LAD is smooth and normal.  The 1st diag. Has a 30% - 40% stenosis at the origin.  Left Circumflex: large.  Provides primarily a 1st OM distribution.  Smooth and normal.  Right Coronary Artery: large, dominant, smooth and normal  LV Gram: normal LV function. EF 60%  Complications: No apparent complications Patient did tolerate procedure well.  Conclusions:   1. Minor luminal irregularities.  No flow limiting stenosis. 2. Normal LV function.  He should be able to go home today.  I will see him in the office.   Vesta Mixer, Montez Hageman., MD, [facility] 01/03/2012, 11:48 AM Office - [phone] Pager [phone]

## 2012-01-03 NOTE — Progress Notes (Signed)
Reviewed discharge instructions with patient and sister, they stated their understanding.  Patient ambulated hallways x3 after bedrest completed.  Right groin level 0 after ambulation, dressing removed and band aid applied.  Patient discharged via wheelchair.  Colman Cater

## 2012-01-03 NOTE — Discharge Summary (Signed)
Discharge Summary   Patient ID: James [name],  MRN: [id number], DOB/AGE: May 14, 1941 71 y.o.  Admit date: 01-16-2012 Discharge date: 01/03/2012  Primary Physician: No primary provider on file. Primary Cardiologist: Dr. Louanne Skye in [place], [place]/Dr. Hallee Mckenny  Discharge Diagnoses Principal Problem:  *Chest pain Active Problems:  Hyperlipidemia  CAD (coronary artery disease)  Hypothyroidism  GERD (gastroesophageal reflux disease)   Allergies Allergies  Allergen Reactions  . Sulfa Antibiotics Other (See Comments)    Childhood allergy    Diagnostic Studies/Procedures  CHEST X-RAY- 16-Jan-2012  Comparison: None.  Findings: The lungs are clear. Mediastinal contours appear normal.  The heart is within normal limits in size. No bony abnormality is  seen.  IMPRESSION:  No active lung disease.  CARDIAC CATHETERIZATION- 01/03/12  Hemodynamics:  LV pressure: 112/8  Aortic pressure: 110/55  Angiography  Left Main: Smooth and normal  Left anterior Descending: mild calcification. There is a 30% stenosis at the origen of the LAD. The remaining LAD is smooth and normal. The 1st diag. Has a 30% - 40% stenosis at the origin.  Left Circumflex: large. Provides primarily a 1st OM distribution. Smooth and normal.  Right Coronary Artery: large, dominant, smooth and normal  LV Gram: normal LV function. EF 60%  Complications: No apparent complications  Patient did tolerate procedure well.  Conclusions:  1. Minor luminal irregularities. No flow limiting stenosis.  2. Normal LV function.  History of Present Illness  James [name] is a 71yo male with PMHx significant for the above problem list who was admitted to [facility] on 01/16/2012 with c/o chest pain. Of note, the patient has a question of documented atrial fibrillation during a stress Myoview in 11/2011 in [place].   Records were obtained and summarized from the patient and provider in [place] noted below:  * Cardiac Cath 2008  revealed 40% prox LAD stenosis, FFR 0.95 suggesting none to minimal obstruction  * Stress Myoview 08/05/10 revealed normal perfusion images, no evidence of ischemia or scar, EF 61%.  * Echo 12/02/11 revealed nl LV function, EF >55%, no WMAs.  * Cardiac CTA 12/02/11 revealed LAD dz (ostial narrowing 50-70%, prox LAD 50% narrowing, <25% narrowing at septal perforator, 50-75% stenosis of first diagonal ostially), LM with 10-20% narrowing distally, LCx & RCA free of dz, nl LV fxn EF 60%.  * Stress Myoview 11/2011 - ? atrial fibrillation, no ischemia  * Event monitor 11/2011 Sinus tach, PVCs  He is a long-distance runner, and has been for many years, but notes a decline in running performance for several months due to shortness of breath and chest pain. The frequency and intensity of these episodes increased and began to occur at rest. He had originally called the office (Dr. Graciela Husbands was suggested through a mutual friend) and advised to presentation to [facility] ED. EKG in the ED revealed no ischemia changes, CXR as above revealed no cardiopulmonary abnormalities. POC trop-I WNL. BMET/CBC unremarkable. Given his lack of LE edema, tachycardia (he was in fact bradycardic), hypoxia or tachypnea, there was a low-suspicion for PE. Given his cardiac history and HPI concerning for unstable angina, he was admitted to telemetry and plans were made for diagnostic cardiac catheterization.   Hospital Course   He was continued on outpatient medications. There were no events overnight. TSH returned WNL. Lipid panel revealed good lipid control. Cardiac biomarkers were cycled and found to be negative x 3 (trop-I x 4). The patient was informed, consented and prepped for cardiac catheterization which is detailed in  full above. This was notable for 30% ostial LAD stenosis, ostial 30-40% D1 stenosis, smooth and normal left main and RCA; LVEF 65%. The patient tolerated the procedure well without complications. Dr. Elease Hashimoto felt that he was stable  for discharge after completing four hours of bed rest, and to follow-up with him in the office.   The patient was transferred in good condition back to telemetry. He was noted to be in sinus bradycardia on telemetry (HR 40s). He was asymptomatic with and is likely his normal resting HR given that he is a long-distance runner. There were no acute events and he ambulated well with adequate hemostasis to the R groin site. No evidence of bruit, hematoma, pain or discharge (examined by this author). He felt well and was discharged. He will continue all outpatient medications. NTG SL PRN was added at discharge. He will follow-up with Norma Fredrickson in the office as noted above. Additionally, he will establish with [facility] in [facility].   Discharge Vitals:  Blood pressure 99/56, pulse 42, temperature 97.8 F (36.6 C), temperature source Oral, resp. rate 18, height 5\' 10"  (1.778 m), weight 64.774 kg (142 lb 12.8 oz), SpO2 100.00%.   Labs: Recent Labs  Basename 01/03/12 0506 01/02/12 1802   WBC 6.0 6.5   HGB 13.7 13.9   HCT 38.7* 38.1*   MCV 89.0 88.8   PLT 172 190    Lab 01/03/12 0506 01/02/12 1802 01/02/12 1331  NA 139 -- 139  K 3.6 -- 3.8  CL 102 -- 100  CO2 28 -- 28  BUN 13 -- 13  CREATININE 0.86 0.89 0.87  CALCIUM 9.3 -- 9.7  PROT 6.7 -- --  BILITOT 0.5 -- --  ALKPHOS 51 -- --  ALT 16 -- --  AST 22 -- --  AMYLASE -- -- --  LIPASE -- -- --  GLUCOSE 92 -- 109*   Recent Labs  Basename 01/03/12 0506 01/02/12 2342 01/02/12 1802   CKTOTAL 127 152 160   CKMB 2.3 2.6 2.9   CKMBINDEX -- -- --   TROPONINI <0.30 <0.30 <0.30   Recent Labs  Basename 01/03/12 0506   CHOL 148   HDL 61   LDLCALC 74   TRIG 67   CHOLHDL 2.4   LDLDIRECT --    Basename 01/02/12 1802  TSH 1.141  T4TOTAL --  T3FREE --  THYROIDAB --    Disposition:  Discharge Orders    Future Appointments: Provider: Department: Dept Phone: Center:   01/12/2012 2:00 PM Edwyna Perfect, MD [facility]  [phone] [facility]   01/24/2012 10:30 AM Rosalio Macadamia, NP [facility] [phone] None     Follow-up Information    Follow up with Norma Fredrickson, NP on 01/24/2012. (At 10:30 AM for follow-up with cardiology after discharge. )    Contact information:   [address] [place] [address] [phone]       Follow up with Letitia Libra, Ala Dach, MD on 01/12/2012. (At 2:15 PM to establish with a primary care doctor. )    Contact information:   [address] [place] [address] [phone]          Discharge Medications:  Medication List  As of 01/03/2012  4:00 PM   START taking these medications         nitroGLYCERIN 0.4 MG SL tablet   Commonly known as: NITROSTAT   Place 1 tablet (0.4 mg total) under the  tongue every 5 (five) minutes x 3 doses as needed for chest pain.         CONTINUE taking these medications         aspirin 81 MG tablet      CALCIUM 1000 + D PO      esomeprazole 40 MG capsule   Commonly known as: NEXIUM      Iron 325 (65 FE) MG Tabs      JALYN 0.5-0.4 MG Caps   Generic drug: Dutasteride-Tamsulosin HCl      levothyroxine 100 MCG tablet   Commonly known as: SYNTHROID, LEVOTHROID      mirtazapine 15 MG tablet   Commonly known as: REMERON      multivitamins ther. w/minerals Tabs      pravastatin 20 MG tablet   Commonly known as: PRAVACHOL      PRESCRIPTION MEDICATION      Vitamin B Complex-C Caps      zolpidem 10 MG tablet   Commonly known as: AMBIEN          Where to get your medications    These are the prescriptions that you need to pick up. We sent them to a specific pharmacy, so you will need to go there to get them.   [facility] - [place], [place] - [address]    [address] [place] [address]    Phone: [phone]        nitroGLYCERIN 0.4 MG SL tablet           Outstanding Labs/Studies: None  Duration of Discharge Encounter:  Greater than 30 minutes including physician time.  Signed, R. Hurman Horn, PA-C 01/03/2012, 4:00 PM  Attending Note:   The patient was seen and examined.  Agree with assessment and plan as noted above.  See my note from earlier the day of discharge.  Vesta Mixer, Montez Hageman., MD, [facility] 01/04/2012, 10:26 AM

## 2012-01-06 DIAGNOSIS — I4891 Unspecified atrial fibrillation: Secondary | ICD-10-CM | POA: Diagnosis not present

## 2012-01-11 ENCOUNTER — Ambulatory Visit: Payer: Federal, State, Local not specified - PPO | Admitting: Internal Medicine

## 2012-01-12 ENCOUNTER — Ambulatory Visit (INDEPENDENT_AMBULATORY_CARE_PROVIDER_SITE_OTHER): Payer: Medicare Other | Admitting: Internal Medicine

## 2012-01-12 ENCOUNTER — Encounter: Payer: Self-pay | Admitting: Internal Medicine

## 2012-01-12 VITALS — BP 100/72 | HR 56 | Temp 98.6°F | Resp 16 | Ht 69.5 in | Wt 154.0 lb

## 2012-01-12 DIAGNOSIS — I2510 Atherosclerotic heart disease of native coronary artery without angina pectoris: Secondary | ICD-10-CM

## 2012-01-12 DIAGNOSIS — R079 Chest pain, unspecified: Secondary | ICD-10-CM | POA: Diagnosis not present

## 2012-01-12 MED ORDER — DICLOFENAC SODIUM 75 MG PO TBEC: DELAYED_RELEASE_TABLET | ORAL | Status: DC

## 2012-01-19 NOTE — Assessment & Plan Note (Signed)
S/p cardiac catheterization with mild nonobstructive dz. Sx's partially responsive to nsaids. Stop otc aleve and begin empiric course of voltaren to be taken with food and no other nsaids. Schedule close f/u. If sx's persist consider further evaluation of possible GI versus pulmonary etiology.

## 2012-01-19 NOTE — Assessment & Plan Note (Signed)
Nonobstructive based on recent cath. Pursue risk factor modification.

## 2012-01-19 NOTE — Progress Notes (Signed)
  Subjective:    Patient ID: James [name], male    DOB: [birth year], 71 y.o.   MRN: [id number]  HPI Pt presents to clinic for evaluation of multiple medical problems. Reviewed recent hospitalization for CP with cardiac catheterization demonstrating mild nonobstructive CAD with preserved EF. Notes continued intermittent episodes of chest discomfort and dyspnea. States feels like has to take a deep breath. Has known h/o esophageal ?stricture requiring EGD with dilatation several years ago. Has rare dysphagia but not consistent. Does state taking aleve helps the chest discomfort. No chest wall tenderness or positional triggers. Denies palpitations and event monitor performed 6/13 demonstrated ST and PVC's.  Past Medical History  Diagnosis Date  . CAD (coronary artery disease)     nonobstructive by cath 2008; nonobstructive by Cardiac CT 11/2011  . Hypothyroidism   . Atrial fibrillation   . Diverticulosis   . Mitral valve prolapse   . HLD (hyperlipidemia)   . GERD (gastroesophageal reflux disease)   . Hiatal hernia   . Cervical spine fracture 1980    C6-7  . 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 History of squamous cell carcinoma 10-05-10    Dr Cristopher Estimable, [place]   Past Surgical History  Procedure Date  . Appendectomy 05/2011  . Inguinal hernia repair 07/2010    right  . Knee arthroplasty     bilat  . Cardiac catheterization 01/03/12    30% ostial LAD stenosis, ostial 30-40% D1 stenosis, smooth and normal left main and RCA; LVEF 65%  . Tonsillectomy 1947  . Tumor removal 1958    "fatty tumor"    reports that he has never smoked. He does not have any smokeless tobacco history on file. He reports that he drinks alcohol. He reports that he does not use illicit drugs. family history includes Heart disease in his maternal grandfather, maternal grandmother, and mother. Allergies  Allergen Reactions  . Sulfa Antibiotics Other (See Comments)    Childhood allergy      Review of Systems  Respiratory: Positive for shortness of breath. Negative for cough.   Cardiovascular: Positive for chest pain.  All other systems reviewed and are negative.       Objective:   Physical Exam  Nursing note and vitals reviewed. Constitutional: He appears well-developed and well-nourished. No distress.  HENT:  Head: Normocephalic.  Right Ear: External ear normal.  Left Ear: External ear normal.  Eyes: Conjunctivae are normal. No scleral icterus.  Neck: Neck supple. No JVD present. No thyromegaly present.  Cardiovascular: Normal rate, regular rhythm and normal heart sounds.  Exam reveals no gallop and no friction rub.   Pulmonary/Chest: Effort normal and breath sounds normal. No respiratory distress. He has no wheezes. He has no rales. He exhibits no tenderness.  Lymphadenopathy:    He has no cervical adenopathy.  Neurological: He is alert.  Skin: Skin is warm and dry. He is not diaphoretic.  Psychiatric: He has a normal mood and affect.          Assessment & Plan:

## 2012-01-24 ENCOUNTER — Encounter: Payer: Self-pay | Admitting: Nurse Practitioner

## 2012-01-24 ENCOUNTER — Ambulatory Visit (INDEPENDENT_AMBULATORY_CARE_PROVIDER_SITE_OTHER): Payer: Federal, State, Local not specified - PPO | Admitting: Nurse Practitioner

## 2012-01-24 VITALS — BP 126/80 | HR 46 | Ht 70.0 in | Wt 152.0 lb

## 2012-01-24 DIAGNOSIS — I2510 Atherosclerotic heart disease of native coronary artery without angina pectoris: Secondary | ICD-10-CM

## 2012-01-24 MED ORDER — PANTOPRAZOLE SODIUM 40 MG PO TBEC: 40.0000 mg | DELAYED_RELEASE_TABLET | Freq: Two times a day (BID) | ORAL | Status: DC

## 2012-01-24 NOTE — Patient Instructions (Addendum)
Really try to cut back your caffeine and alcohol  Stop the Nexium  Try Protonix 40 mg two times a day  Stay on your other medicines.  We will see you back in about 4 to 6 weeks.  Call the [facility] office at [phone] if you have any questions, problems or concerns.

## 2012-01-24 NOTE — Progress Notes (Signed)
[name] Date of Birth: [birth year] Medical Record #[id number]  History of Present Illness: Mr. Rundle is seen today for a post hospital visit. He is seen for Dr. Elease Hashimoto. He is a 71 year old male who is in transition with moving here from [place]. He has known CAD, hypothryoidism, HLD and GERD.   He comes in today. He is here with his sister. He presented to the ER after having an stress test in [place]. There was concern for atrial fib but no ischemia or infarct noted with normal EF. An echo showed normal LV function and no wall motion abnormalities. A cardiac CTA showed CAD with normal EF. He had an event monitor which just showed sinus tachycardia and PVC's. He presented with chest pain and underwent cardiac cath with Dr. Elease Hashimoto. He has mild but nonobstructive CAD. EF is normal. He will be managed medically. He has a resting bradycardia. He is a runner.    He reports today that he is feeling much better. He reports being at least 85% improved. He still notes some shortness of breath and chest pain, but it is significantly less. He had no relief with NTG. He does have reflux. He is back running at a moderate level. His symptoms occur with and without exertion. He is using caffeine and has daily alcohol use as well. He says his pulse ox is in the high 90's. He is wondering if this is more GI related. He has been on Nexium for many years. Remote EGD in 2008. Has GERD and hiatal hernia. No reports of asthma.    Current Outpatient Prescriptions on File Prior to Visit  Medication Sig Dispense Refill  . aspirin 81 MG tablet Take 81 mg by mouth daily.      . Calcium Carb-Cholecalciferol (CALCIUM 1000 + D PO) Take 1 tablet by mouth daily.      . Dutasteride-Tamsulosin HCl (JALYN) 0.5-0.4 MG CAPS Take 1 capsule by mouth daily.      . Ferrous Sulfate (IRON) 325 (65 FE) MG TABS Take 1 tablet by mouth daily.      Marland Kitchen levothyroxine (SYNTHROID, LEVOTHROID) 100 MCG tablet Take 100 mcg by mouth daily.      .  mirtazapine (REMERON) 15 MG tablet Take 15 mg by mouth at bedtime.      . Multiple Vitamins-Minerals (MULTIVITAMINS THER. W/MINERALS) TABS Take 1 tablet by mouth daily.      . nitroGLYCERIN (NITROSTAT) 0.4 MG SL tablet Place 1 tablet (0.4 mg total) under the tongue every 5 (five) minutes x 3 doses as needed for chest pain.  25 tablet  3  . pravastatin (PRAVACHOL) 20 MG tablet Take 20 mg by mouth daily.      . Testosterone (AXIRON) 30 MG/ACT SOLN Place 1 application onto the skin daily.      . Vitamin B Complex-C CAPS Take 1 capsule by mouth daily.      Marland Kitchen zolpidem (AMBIEN) 10 MG tablet Take 10 mg by mouth at bedtime as needed. For sleep      . pantoprazole (PROTONIX) 40 MG tablet Take 1 tablet (40 mg total) by mouth 2 (two) times daily.  60 tablet  11    Allergies  Allergen Reactions  . Sulfa Antibiotics Other (See Comments)    Childhood allergy    Past Medical History  Diagnosis Date  . CAD (coronary artery disease)     nonobstructive by cath 2008; nonobstructive by Cardiac CT 11/2011  . Hypothyroidism   . Atrial  fibrillation   . Diverticulosis   . Mitral valve prolapse   . HLD (hyperlipidemia)   . GERD (gastroesophageal reflux disease)   . Hiatal hernia   . Cervical spine fracture 1980    C6-7  . 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 History of squamous cell carcinoma 10-05-10    Dr Cristopher Estimable, [place]    Past Surgical History  Procedure Date  . Appendectomy 05/2011  . Inguinal hernia repair 07/2010    right  . Knee arthroplasty     bilat  . Cardiac catheterization 01/03/12    30% ostial LAD stenosis, ostial 30-40% D1 stenosis, smooth and normal left main and RCA; LVEF 65%  . Tonsillectomy 1947  . Tumor removal 1958    "fatty tumor"    History  Smoking status  . Never Smoker   Smokeless tobacco  . Not on file    History  Alcohol Use  . Yes    2 glasses red wine/day    Family History  Problem Relation Age of Onset  . Heart disease  Mother     MI 48s  . Heart disease Maternal Grandfather     MI 70-80s  . Heart disease Maternal Grandmother     MI 70-80s    Review of Systems: The review of systems is per the HPI.  All other systems were reviewed and are negative.  Physical Exam: BP 126/80  Pulse 46  Ht 5\' 10"  (1.778 m)  Wt 152 lb (68.947 kg)  BMI 21.81 kg/m2 Patient is very pleasant and in no acute distress. Skin is warm and dry. Color is normal.  HEENT is unremarkable. Normocephalic/atraumatic. PERRL. Sclera are nonicteric. Neck is supple. No masses. No JVD. Lungs are clear. Cardiac exam shows a regular rate and rhythm. Abdomen is soft. Extremities are without edema. Gait and ROM are intact. No gross neurologic deficits noted.   LABORATORY DATA: Lab Results  Component Value Date   WBC 6.0 01/03/2012   HGB 13.7 01/03/2012   HCT 38.7* 01/03/2012   PLT 172 01/03/2012   GLUCOSE 92 01/03/2012   CHOL 148 01/03/2012   TRIG 67 01/03/2012   HDL 61 01/03/2012   LDLCALC 74 01/03/2012   ALT 16 01/03/2012   AST 22 01/03/2012   NA 139 01/03/2012   K 3.6 01/03/2012   CL 102 01/03/2012   CREATININE 0.86 01/03/2012   BUN 13 01/03/2012   CO2 28 01/03/2012   TSH 1.141 01/02/2012   INR 1.07 01/02/2012   Coronary Angiography   Left Main: Smooth and normal  Left anterior Descending: mild calcification. There is a 30% stenosis at the origen of the LAD. The remaining LAD is smooth and normal. The 1st diag. Has a 30% - 40% stenosis at the origin.  Left Circumflex: large. Provides primarily a 1st OM distribution. Smooth and normal.  Right Coronary Artery: large, dominant, smooth and normal  LV Gram: normal LV function. EF 60%  Complications: No apparent complications  Patient did tolerate procedure well.   Conclusions:  1. Minor luminal irregularities. No flow limiting stenosis.  2. Normal LV function.  He should be able to go home today.  I will see him in the office.   Alvia Grove., MD, [facility]   Assessment / Plan:

## 2012-01-24 NOTE — Assessment & Plan Note (Addendum)
Patient presents post hospitalization for chest pain. He is improved considerably but not totally asymptomatic. I have changed his Nexium to Protonix. He has seen GI in the remote past and apparently has had prior esophageal dilatation. He is already back running. I have asked him to reduce his caffeine and alcohol intake. This may help with his PVC's. He may need referral to pulmonary if his shortness of breath persists as well. I do not see evidence of atrial fib today on exam. He would not be able to tolerate beta blocker due to resting bradycardia. Dr. Melburn Popper will see him back in about 6 weeks. Patient is agreeable to this plan and will call if any problems develop in the interim.

## 2012-01-25 ENCOUNTER — Ambulatory Visit (INDEPENDENT_AMBULATORY_CARE_PROVIDER_SITE_OTHER): Payer: Medicare Other | Admitting: Internal Medicine

## 2012-01-25 ENCOUNTER — Encounter: Payer: Self-pay | Admitting: Internal Medicine

## 2012-01-25 VITALS — BP 96/62 | HR 48 | Temp 97.6°F | Resp 14 | Ht 70.0 in | Wt 151.2 lb

## 2012-01-25 DIAGNOSIS — R079 Chest pain, unspecified: Secondary | ICD-10-CM | POA: Diagnosis not present

## 2012-01-25 NOTE — Progress Notes (Signed)
  Subjective:    Patient ID: James [name], male    DOB: 02/03/1941, 71 y.o.   MRN: [id number]  HPI Pt presents to clinic for follow up of multiple complaints. Notes that chest discomfort and dyspnea is spontaneously improving. Now ~85% better. S/p voltaren but stopped after one week due to abd distention and constipation. Saw cardiology yesterday who changed his PPI to protonix bid. Total time of visit ~18 minutes of which >50% of time spent on counseling.  Past Medical History  Diagnosis Date  . CAD (coronary artery disease)     nonobstructive by cath 2008; nonobstructive by Cardiac CT 11/2011  . Hypothyroidism   . Atrial fibrillation   . Diverticulosis   . Mitral valve prolapse   . HLD (hyperlipidemia)   . GERD (gastroesophageal reflux disease)   . Hiatal hernia   . Cervical spine fracture 1980    C6-7  . 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 History of squamous cell carcinoma 10-05-10    Dr Cristopher Estimable, [place]   Past Surgical History  Procedure Date  . Appendectomy 05/2011  . Inguinal hernia repair 07/2010    right  . Knee arthroplasty     bilat  . Cardiac catheterization 01/03/12    30% ostial LAD stenosis, ostial 30-40% D1 stenosis, smooth and normal left main and RCA; LVEF 65%  . Tonsillectomy 1947  . Tumor removal 1958    "fatty tumor"    reports that he has never smoked. He does not have any smokeless tobacco history on file. He reports that he drinks alcohol. He reports that he does not use illicit drugs. family history includes Heart disease in his maternal grandfather, maternal grandmother, and mother. Allergies  Allergen Reactions  . Sulfa Antibiotics Other (See Comments)    Childhood allergy     Review of Systems see hpi     Objective:   Physical Exam  Nursing note and vitals reviewed. Constitutional: He appears well-developed and well-nourished. No distress.  HENT:  Head: Normocephalic and atraumatic.  Eyes: Conjunctivae are normal.  No scleral icterus.  Neck: Neck supple.  Cardiovascular: Normal rate, regular rhythm and normal heart sounds.  Exam reveals no gallop and no friction rub.   No murmur heard. Pulmonary/Chest: Effort normal and breath sounds normal. No respiratory distress. He has no wheezes. He has no rales.  Skin: Skin is warm and dry. He is not diaphoretic.  Psychiatric: He has a normal mood and affect.          Assessment & Plan:

## 2012-01-28 NOTE — Assessment & Plan Note (Signed)
Sx's appear to be resolving. Monitor sx's after ppi change. Followup if no improvement or worsening. Discussed GI or pulmonary referral ultimately if sx's do not resolve.

## 2012-02-01 ENCOUNTER — Encounter: Payer: Self-pay | Admitting: Internal Medicine

## 2012-02-01 ENCOUNTER — Ambulatory Visit (INDEPENDENT_AMBULATORY_CARE_PROVIDER_SITE_OTHER): Payer: Medicare Other | Admitting: Internal Medicine

## 2012-02-01 VITALS — BP 106/66 | HR 46 | Temp 97.8°F | Resp 16 | Wt 150.2 lb

## 2012-02-01 DIAGNOSIS — J069 Acute upper respiratory infection, unspecified: Secondary | ICD-10-CM

## 2012-02-01 DIAGNOSIS — K5900 Constipation, unspecified: Secondary | ICD-10-CM

## 2012-02-01 MED ORDER — DOXYCYCLINE HYCLATE 100 MG PO TABS: 100.0000 mg | ORAL_TABLET | Freq: Two times a day (BID) | ORAL | Status: AC

## 2012-02-01 NOTE — Assessment & Plan Note (Signed)
Much improved after BM today. Exam nl. Continue metamucil. Notify clinic if recurs.

## 2012-02-01 NOTE — Progress Notes (Signed)
  Subjective:    Patient ID: James [name], male    DOB: 1941-02-14, 71 y.o.   MRN: [id number]  HPI Pt presents to clinic for evaluation of constipation and abdominal distention. Notes two week h/o abdominal distention and constipation after taking brief course of voltaren now stopped. Denies N/V or blood in stool. Had BM this morning after taking metamucil for the last 3 days. BM was adequate and had resolution of distention. Also notes ~1week h/o cough intermittently productive for green sputum as well as sinus/nasal congestion. Taking otc cold medication prn. No other alleviating or exacerbating factors. May be feeling improved over last 24 hours.  Past Medical History  Diagnosis Date  . CAD (coronary artery disease)     nonobstructive by cath 2008; nonobstructive by Cardiac CT 11/2011  . Hypothyroidism   . Atrial fibrillation   . Diverticulosis   . Mitral valve prolapse   . HLD (hyperlipidemia)   . GERD (gastroesophageal reflux disease)   . Hiatal hernia   . Cervical spine fracture 1980    C6-7  . 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 History of squamous cell carcinoma 10-05-10    Dr Cristopher Estimable, [place]   Past Surgical History  Procedure Date  . Appendectomy 05/2011  . Inguinal hernia repair 07/2010    right  . Knee arthroplasty     bilat  . Cardiac catheterization 01/03/12    30% ostial LAD stenosis, ostial 30-40% D1 stenosis, smooth and normal left main and RCA; LVEF 65%  . Tonsillectomy 1947  . Tumor removal 1958    "fatty tumor"    reports that he has never smoked. He does not have any smokeless tobacco history on file. He reports that he drinks alcohol. He reports that he does not use illicit drugs. family history includes Heart disease in his maternal grandfather, maternal grandmother, and mother. Allergies  Allergen Reactions  . Sulfa Antibiotics Other (See Comments)    Childhood allergy     Review of Systems see hpi     Objective:   Physical Exam  Nursing note and vitals reviewed. Constitutional: He appears well-developed and well-nourished. No distress.  HENT:  Head: Normocephalic and atraumatic.  Eyes: Conjunctivae are normal. No scleral icterus.  Pulmonary/Chest: Effort normal and breath sounds normal. No respiratory distress. He has no wheezes. He has no rales.  Abdominal: Soft. Normal appearance and bowel sounds are normal. He exhibits no distension and no mass. There is no hepatosplenomegaly. There is no tenderness.  Skin: He is not diaphoretic.          Assessment & Plan:

## 2012-02-01 NOTE — Assessment & Plan Note (Signed)
givn abx to hold. Begin if sx's do not improve after total duration of 8-10 days. Followup if no improvement or worsening.

## 2012-02-29 ENCOUNTER — Encounter: Payer: Self-pay | Admitting: Internal Medicine

## 2012-02-29 ENCOUNTER — Ambulatory Visit (INDEPENDENT_AMBULATORY_CARE_PROVIDER_SITE_OTHER): Payer: Medicare Other | Admitting: Internal Medicine

## 2012-02-29 VITALS — BP 110/78 | HR 53 | Temp 97.9°F | Resp 14 | Wt 149.2 lb

## 2012-02-29 DIAGNOSIS — R079 Chest pain, unspecified: Secondary | ICD-10-CM | POA: Diagnosis not present

## 2012-02-29 DIAGNOSIS — J40 Bronchitis, not specified as acute or chronic: Secondary | ICD-10-CM

## 2012-02-29 MED ORDER — DOXYCYCLINE HYCLATE 100 MG PO TABS: 100.0000 mg | ORAL_TABLET | Freq: Two times a day (BID) | ORAL | Status: DC

## 2012-02-29 NOTE — Patient Instructions (Signed)
Please schedule fasting labs prior to next visit Cbc-cad, chem7-v58.69, tsh/free t4-hypothyroidism and lipid/lft-272.4

## 2012-03-05 ENCOUNTER — Telehealth: Payer: Self-pay | Admitting: Internal Medicine

## 2012-03-05 MED ORDER — LEVOTHYROXINE SODIUM 100 MCG PO TABS: 100.0000 ug | ORAL_TABLET | Freq: Every day | ORAL | Status: DC

## 2012-03-05 NOTE — Telephone Encounter (Signed)
Rx done/SLS 

## 2012-03-05 NOTE — Telephone Encounter (Signed)
Refill- levothyroxine sod tab. Take one tablet every morning on an empty stomach. Qty 30 last fill 8.13.13

## 2012-03-08 ENCOUNTER — Ambulatory Visit (INDEPENDENT_AMBULATORY_CARE_PROVIDER_SITE_OTHER): Payer: Medicare Other | Admitting: Cardiovascular Disease

## 2012-03-08 ENCOUNTER — Encounter: Payer: Self-pay | Admitting: Cardiovascular Disease

## 2012-03-08 VITALS — BP 118/80 | HR 47 | Ht 70.0 in | Wt 146.1 lb

## 2012-03-08 DIAGNOSIS — I2510 Atherosclerotic heart disease of native coronary artery without angina pectoris: Secondary | ICD-10-CM

## 2012-03-08 DIAGNOSIS — J40 Bronchitis, not specified as acute or chronic: Secondary | ICD-10-CM | POA: Insufficient documentation

## 2012-03-08 NOTE — Assessment & Plan Note (Addendum)
Resolved

## 2012-03-08 NOTE — Assessment & Plan Note (Signed)
He has minor coronary artery irregularities. He is LDL is 74. The pravastatin seems to be working quite well.  We will see him on an as-needed basis. He'll continue to followup with his general medical Dr.  Peggye Form encouraged him to continue to exercise on a regular basis.

## 2012-03-08 NOTE — Assessment & Plan Note (Signed)
Attempt course of antibiotics. followup if no improvement or worsening.

## 2012-03-08 NOTE — Progress Notes (Signed)
James [name] Date of Birth  07/22/1940       [facility]    [place] [address]  [address] [place], [place]  [address]   [place], [place]  [address] [phone]     [phone]   Fax  [phone]    Fax [phone]  Problem List: 1. Chest pain-normal cardiac catheterization in July, 2013 2. Paroxysmal atrial fibrillation 3. Hyperlipidemia 4. Hypothyroidism  History of Present Illness: James [name] is seen today for a post hospital visit. H He is a 71 year old male who is in transition with moving here from [place]. He has known CAD, hypothryoidism, HLD and GERD.  He comes in today. He is here with his sister. He presented to the ER after having an stress test in [place]. There was concern for atrial fib but no ischemia or infarct noted with normal EF. An echo showed normal LV function and no wall motion abnormalities. A cardiac CTA showed CAD with normal EF. He had an event monitor which just showed sinus tachycardia and PVC's. He presented with chest pain and underwent cardiac cath with Dr. Elease [name]. He has mild but nonobstructive CAD. EF is normal. He will be managed medically. He has a resting bradycardia. He is a runner.  He reports today that he is feeling much better. He reports being at least 85% improved. He still notes some shortness of breath and chest pain, but it is significantly less. He had no relief with NTG. He does have reflux. He is back running at a moderate level. His symptoms occur with and without exertion. He is using caffeine and has daily alcohol use as well. He says his pulse ox is in the high 90's. He is wondering if this is more GI related. He has been on Nexium for many years. Remote EGD in 2008. Has GERD and hiatal hernia. No reports of asthma.   Sept. 19, 2013 He has done well .  He has not had any additional episodes of chest pain.  He was admitted in July, 2013 with chest pains. At cardiac catheterization he  was found to have minor luminal irregularities. Fasting lipids were well controlled. He has remained active. He's been running a regular basis. He's not had any further episodes of chest pain or shortness of breath.  Current Outpatient Prescriptions on File Prior to Visit  Medication Sig Dispense Refill  . aspirin 81 MG tablet Take 81 mg by mouth daily.      . Calcium Carb-Cholecalciferol (CALCIUM 1000 + D PO) Take 1 tablet by mouth daily.      . Dutasteride-Tamsulosin HCl (JALYN) 0.5-0.4 MG CAPS Take 1 capsule by mouth daily.      . Ferrous Sulfate (IRON) 325 (65 FE) MG TABS Take 1 tablet by mouth daily.      Marland Kitchen levothyroxine (SYNTHROID, LEVOTHROID) 100 MCG tablet Take 1 tablet (100 mcg total) by mouth daily.  30 tablet  3  . mirtazapine (REMERON) 15 MG tablet Take 15 mg by mouth at bedtime.      . Multiple Vitamins-Minerals (MULTIVITAMINS THER. W/MINERALS) TABS Take 1 tablet by mouth daily.      . nitroGLYCERIN (NITROSTAT) 0.4 MG SL tablet Place 1 tablet (0.4 mg total) under the tongue every 5 (five) minutes x 3 doses as needed for chest pain.  25 tablet  3  . pantoprazole (PROTONIX) 40 MG tablet Take 1 tablet (40 mg total) by mouth 2 (two)  times daily.  60 tablet  11  . pravastatin (PRAVACHOL) 20 MG tablet Take 20 mg by mouth daily.      . pseudoephedrine (SUDAFED) 30 MG tablet Take 30 mg by mouth every 4 (four) hours as needed.      . psyllium (METAMUCIL) 58.6 % powder Take 1 packet by mouth 3 (three) times daily.      . Testosterone (AXIRON) 30 MG/ACT SOLN Place 2 application onto the skin daily.       . Vitamin B Complex-C CAPS Take 1 capsule by mouth daily.      Marland Kitchen zolpidem (AMBIEN) 10 MG tablet Take 10 mg by mouth at bedtime as needed. For sleep        Allergies  Allergen Reactions  . Sulfa Antibiotics Other (See Comments)    Childhood allergy    Past Medical History  Diagnosis Date  . CAD (coronary artery disease)     nonobstructive by cath 2008; nonobstructive by Cardiac CT  11/2011  . Hypothyroidism   . Atrial fibrillation   . Diverticulosis   . Mitral valve prolapse   . HLD (hyperlipidemia)   . GERD (gastroesophageal reflux disease)   . Hiatal hernia   . Cervical spine fracture 1980    C6-7  . 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 History of squamous cell carcinoma 10-05-10    Dr James [name], [place]    Past Surgical History  Procedure Date  . Appendectomy 05/2011  . Inguinal hernia repair 07/2010    right  . Knee arthroplasty     bilat  . Cardiac catheterization 01/03/12    30% ostial LAD stenosis, ostial 30-40% D1 stenosis, smooth and normal left main and RCA; LVEF 65%  . Tonsillectomy 1947  . Tumor removal 1958    "fatty tumor"    History  Smoking status  . Never Smoker   Smokeless tobacco  . Not on file    History  Alcohol Use  . Yes    2 glasses red wine/day    Family History  Problem Relation Age of Onset  . Heart disease Mother     MI 95s  . Heart disease Maternal Grandfather     MI 70-80s  . Heart disease Maternal Grandmother     MI 21-80s    Reviw of Systems:  Reviewed in the HPI.  All other systems are negative.  Physical Exam: Blood pressure 118/80, pulse 47, height 5\' 10"  (1.778 m), weight 146 lb 1.9 oz (66.28 kg). General: Well developed, well nourished, in no acute distress.  Head: Normocephalic, atraumatic, sclera non-icteric, mucus membranes are moist,   Neck: Supple. Carotids are 2 + without bruits. No JVD  Lungs: Clear bilaterally to auscultation.  Heart: regular rate.  normal  S1 S2. No murmurs, gallops or rubs.  Abdomen: Soft, non-tender, non-distended with normal bowel sounds. No hepatomegaly. No rebound/guarding. No masses.  Msk:  Strength and tone are normal  Extremities: No clubbing or cyanosis. No edema.  Distal pedal pulses are 2+ and equal bilaterally.  Neuro: Alert and oriented X 3. Moves all extremities spontaneously.  Psych:  Responds to questions appropriately  with a normal affect.  ECG:   Assessment / Plan:

## 2012-03-08 NOTE — Progress Notes (Signed)
  Subjective:    Patient ID: James [name], male    DOB: 04-27-1941, 71 y.o.   MRN: [id number]  HPI Pt presents to clinic for followup of multiple medical problems. Chest pain and shortness of breath resolved. Does not one-month history of cough productive for off-white sputum without hemoptysis. No fever chills or sick exposure. No alleviating or exacerbating factors. No other complaints.  Past Medical History  Diagnosis Date  . CAD (coronary artery disease)     nonobstructive by cath 2008; nonobstructive by Cardiac CT 11/2011  . Hypothyroidism   . Atrial fibrillation   . Diverticulosis   . Mitral valve prolapse   . HLD (hyperlipidemia)   . GERD (gastroesophageal reflux disease)   . Hiatal hernia   . Cervical spine fracture 1980    C6-7  . 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 History of squamous cell carcinoma 10-05-10    Dr Cristopher Estimable, [place]   Past Surgical History  Procedure Date  . Appendectomy 05/2011  . Inguinal hernia repair 07/2010    right  . Knee arthroplasty     bilat  . Cardiac catheterization 01/03/12    30% ostial LAD stenosis, ostial 30-40% D1 stenosis, smooth and normal left main and RCA; LVEF 65%  . Tonsillectomy 1947  . Tumor removal 1958    "fatty tumor"    reports that he has never smoked. He does not have any smokeless tobacco history on file. He reports that he drinks alcohol. He reports that he does not use illicit drugs. family history includes Heart disease in his maternal grandfather, maternal grandmother, and mother. Allergies  Allergen Reactions  . Sulfa Antibiotics Other (See Comments)    Childhood allergy      Review of Systems see hpi     Objective:   Physical Exam  Nursing note and vitals reviewed. Constitutional: He appears well-developed and well-nourished. No distress.  HENT:  Head: Normocephalic and atraumatic.  Eyes: Conjunctivae normal are normal. No scleral icterus.  Cardiovascular: Normal rate, regular  rhythm and normal heart sounds.   Pulmonary/Chest: Effort normal and breath sounds normal. No respiratory distress. He has no wheezes. He has no rales.  Neurological: He is alert.  Skin: He is not diaphoretic.  Psychiatric: He has a normal mood and affect.          Assessment & Plan:

## 2012-03-08 NOTE — Patient Instructions (Addendum)
Your physician recommends that you schedule a follow-up appointment in: as needed basis  

## 2012-04-05 ENCOUNTER — Telehealth: Payer: Self-pay | Admitting: *Deleted

## 2012-04-05 NOTE — Telephone Encounter (Signed)
Pt called wanting to know if our flu vaccine covered the 4 strains. Returned pt's call and told him it did not, only 3 strains. Pt wants to know if Efraim Kaufmann thinks he needs it and if so, where could he receive this vaccine? Please advise.

## 2012-04-06 NOTE — Telephone Encounter (Signed)
To my knowledge there is limited vaccine available. It may provide broader flu coverage than the 3 strain vaccine.   He can try calling some of the local pharmacies.  If he is unable to locate, it is fine for him to receive the 3 strain vaccine that we have.

## 2012-04-06 NOTE — Telephone Encounter (Signed)
Notified pt and he voices understanding. 

## 2012-05-29 ENCOUNTER — Other Ambulatory Visit: Payer: Self-pay | Admitting: Internal Medicine

## 2012-05-29 MED ORDER — ZOLPIDEM TARTRATE 10 MG PO TABS: 10.0000 mg | ORAL_TABLET | Freq: Every evening | ORAL | Status: DC | PRN

## 2012-05-29 NOTE — Telephone Encounter (Signed)
Ok #30 [id number]

## 2012-05-29 NOTE — Telephone Encounter (Signed)
Pt left VM that he has recently move back from [place] and his rx for Ambien 10 mg has ran out. Pt is requesting refill on this med. Last OV 02-29-12

## 2012-05-30 ENCOUNTER — Telehealth: Payer: Self-pay | Admitting: Internal Medicine

## 2012-05-30 NOTE — Telephone Encounter (Signed)
Last OV 02-29-12

## 2012-05-30 NOTE — Telephone Encounter (Signed)
Rx sent 

## 2012-05-30 NOTE — Telephone Encounter (Signed)
Refill- axiron 30mg  topical sol ml #90. Use 1 capful/pump under each arm once a day as directed. Last fill 11.7.13

## 2012-05-31 MED ORDER — TESTOSTERONE 30 MG/ACT TD SOLN: 2.0000 "application " | Freq: Every day | TRANSDERMAL | Status: DC

## 2012-05-31 NOTE — Telephone Encounter (Signed)
Rx called to [facility]; 1 pump under each arm once a day #90 mL x 2 refills.

## 2012-05-31 NOTE — Telephone Encounter (Signed)
Ok to fill 

## 2012-06-25 ENCOUNTER — Other Ambulatory Visit: Payer: Self-pay | Admitting: Internal Medicine

## 2012-06-25 NOTE — Telephone Encounter (Signed)
Rx to pharmacy/SLS 

## 2012-07-30 ENCOUNTER — Encounter: Payer: Self-pay | Admitting: Family Medicine

## 2012-07-30 ENCOUNTER — Ambulatory Visit (HOSPITAL_BASED_OUTPATIENT_CLINIC_OR_DEPARTMENT_OTHER)
Admission: RE | Admit: 2012-07-30 | Discharge: 2012-07-30 | Disposition: A | Discharge status: Home / Self Care | Payer: Medicare Other | Source: Ambulatory Visit | Attending: Family Medicine | Admitting: Family Medicine

## 2012-07-30 ENCOUNTER — Ambulatory Visit (INDEPENDENT_AMBULATORY_CARE_PROVIDER_SITE_OTHER): Payer: Medicare Other | Admitting: Family Medicine

## 2012-07-30 VITALS — BP 132/72 | HR 63 | Temp 97.2°F | Ht 70.0 in | Wt 153.0 lb

## 2012-07-30 DIAGNOSIS — I7789 Other specified disorders of arteries and arterioles: Secondary | ICD-10-CM

## 2012-07-30 DIAGNOSIS — E785 Hyperlipidemia, unspecified: Secondary | ICD-10-CM

## 2012-07-30 DIAGNOSIS — R1013 Epigastric pain: Secondary | ICD-10-CM | POA: Diagnosis not present

## 2012-07-30 DIAGNOSIS — K219 Gastro-esophageal reflux disease without esophagitis: Secondary | ICD-10-CM | POA: Diagnosis not present

## 2012-07-30 DIAGNOSIS — R079 Chest pain, unspecified: Secondary | ICD-10-CM

## 2012-07-30 DIAGNOSIS — E291 Testicular hypofunction: Secondary | ICD-10-CM

## 2012-07-30 DIAGNOSIS — I259 Chronic ischemic heart disease, unspecified: Secondary | ICD-10-CM | POA: Diagnosis not present

## 2012-07-30 DIAGNOSIS — I2510 Atherosclerotic heart disease of native coronary artery without angina pectoris: Secondary | ICD-10-CM | POA: Insufficient documentation

## 2012-07-30 DIAGNOSIS — E349 Endocrine disorder, unspecified: Secondary | ICD-10-CM

## 2012-07-30 DIAGNOSIS — E039 Hypothyroidism, unspecified: Secondary | ICD-10-CM

## 2012-07-30 MED ORDER — NITROGLYCERIN 0.4 MG SL SUBL: 0.4000 mg | SUBLINGUAL_TABLET | SUBLINGUAL | Status: DC | PRN

## 2012-07-30 NOTE — Patient Instructions (Addendum)
Labs prior to visit lipid, renal, cbc, tsh, hepatic, psa  Try [employer] [place] Antiperspirant  Mylanta as needed for reflux, if occuring frequently add Zantac 150 mg at bed time.

## 2012-07-30 NOTE — Assessment & Plan Note (Signed)
Generally well controlled on Pantoprazole bid, encouraged to get a bottle of Mylanta to try prn with chest pain

## 2012-08-05 ENCOUNTER — Encounter: Payer: Self-pay | Admitting: Family Medicine

## 2012-08-05 DIAGNOSIS — E349 Endocrine disorder, unspecified: Secondary | ICD-10-CM | POA: Insufficient documentation

## 2012-08-05 HISTORY — DX: Endocrine disorder, unspecified: E34.9

## 2012-08-05 NOTE — Assessment & Plan Note (Signed)
Tolerating Axiron but struggling with some minor skin irritation, encouraged a mild antiperspirant and cleanse with [employer].

## 2012-08-05 NOTE — Assessment & Plan Note (Signed)
Has not needed any ntg but his rx is expired, given refill today

## 2012-08-05 NOTE — Progress Notes (Signed)
Patient ID: James [name], male   DOB: 06-04-1941, 72 y.o.   MRN: [id number] LINDLEY HINEY [id number] [birth year] 08/05/2012      Progress Note-Follow Up  Subjective  Chief Complaint  Chief Complaint  Patient presents with  . Follow-up    5 month    HPI  Patient is a 72 year old male who is in today for followup. He is generally doing well but acknowledges she's been under great stress recently. Has had some intermittent episodes of increased dyspepsia with some burning in his chest and this occurs generally after eating or with stress. No palpitations or shortness of breath. No other changes in bowel habits such as constipation or diarrhea. No fevers or chills or headache. Has not tried any medications for heartburn thus far.  Past Medical History  Diagnosis Date  . CAD (coronary artery disease)     nonobstructive by cath 2008; nonobstructive by Cardiac CT 11/2011  . Hypothyroidism   . Atrial fibrillation   . Diverticulosis   . Mitral valve prolapse   . HLD (hyperlipidemia)   . GERD (gastroesophageal reflux disease)   . Hiatal hernia   . Cervical spine fracture 1980    C6-7  . 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 History of squamous cell carcinoma 10-05-10    Dr Cristopher Estimable, [place]  . 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    Past Surgical History  Procedure Laterality Date  . Appendectomy  05/2011  . Inguinal hernia repair  07/2010    right  . Knee arthroplasty      bilat  . Cardiac catheterization  01/03/12    30% ostial LAD stenosis, ostial 30-40% D1 stenosis, smooth and normal left main and RCA; LVEF 65%  . Tonsillectomy  1947  . Tumor removal  1958    "fatty tumor"    Family History  Problem Relation Age of Onset  . Heart disease Mother     MI 53s  . Heart disease Maternal Grandfather     MI 70-80s  . Heart disease Maternal Grandmother     MI 22-80s    History   Social History  . Marital Status: Single    Spouse Name: N/A    Number of  Children: N/A  . Years of Education: N/A   Occupational History  . Art gallery manager     Retired   Social History Main Topics  . Smoking status: Never Smoker   . Smokeless tobacco: Not on file  . Alcohol Use: Yes     Comment: 2 glasses red wine/day  . Drug Use: No  . Sexually Active: Not on file   Other Topics Concern  . Not on file   Social History Narrative  . No narrative on file    Current Outpatient Prescriptions on File Prior to Visit  Medication Sig Dispense Refill  . aspirin 81 MG tablet Take 81 mg by mouth daily.      . Calcium Carb-Cholecalciferol (CALCIUM 1000 + D PO) Take 1 tablet by mouth daily.      . Dutasteride-Tamsulosin HCl (JALYN) 0.5-0.4 MG CAPS Take 1 capsule by mouth daily.      Marland Kitchen levothyroxine (SYNTHROID, LEVOTHROID) 100 MCG tablet TAKE 1 TABLET EVERY MORNING ON AN EMPTY STOMACH  30 tablet  3  . mirtazapine (REMERON) 15 MG tablet Take 15 mg by mouth at bedtime.      . Multiple Vitamins-Minerals (MULTIVITAMINS THER. W/MINERALS) TABS Take 1 tablet by mouth daily.      Marland Kitchen  pantoprazole (PROTONIX) 40 MG tablet Take 1 tablet (40 mg total) by mouth 2 (two) times daily.  60 tablet  11  . pravastatin (PRAVACHOL) 20 MG tablet Take 20 mg by mouth daily.      . Testosterone (AXIRON) 30 MG/ACT SOLN Place 2 application onto the skin daily.  90 mL  2  . Vitamin B Complex-C CAPS Take 1 capsule by mouth daily.      Marland Kitchen zolpidem (AMBIEN) 10 MG tablet Take 1 tablet (10 mg total) by mouth at bedtime as needed. For sleep  30 tablet  4  . Ferrous Sulfate (IRON) 325 (65 FE) MG TABS Take 1 tablet by mouth daily.       No current facility-administered medications on file prior to visit.    Allergies  Allergen Reactions  . Sulfa Antibiotics Other (See Comments)    Childhood allergy    Review of Systems  Review of Systems  Constitutional: Negative for fever and malaise/fatigue.  HENT: Negative for congestion.   Eyes: Negative for discharge.  Respiratory: Negative for shortness of  breath.   Cardiovascular: Negative for chest pain, palpitations and leg swelling.  Gastrointestinal: Negative for nausea, abdominal pain and diarrhea.  Genitourinary: Negative for dysuria.  Musculoskeletal: Negative for falls.  Skin: Positive for rash.       Under arms recently, clear now  Neurological: Negative for loss of consciousness and headaches.  Endo/Heme/Allergies: Negative for polydipsia.  Psychiatric/Behavioral: Negative for depression and suicidal ideas. The patient is not nervous/anxious and does not have insomnia.     Objective  BP 132/72  Pulse 63  Temp(Src) 97.2 F (36.2 C) (Oral)  Ht 5\' 10"  (1.778 m)  Wt 153 lb (69.4 kg)  BMI 21.95 kg/m2  SpO2 97%  Physical Exam  Physical Exam  Constitutional: He is oriented to person, place, and time and well-developed, well-nourished, and in no distress. No distress.  HENT:  Head: Normocephalic and atraumatic.  Eyes: Conjunctivae are normal.  Neck: Neck supple. No thyromegaly present.  Cardiovascular: Normal rate, regular rhythm and normal heart sounds.   No murmur heard. Pulmonary/Chest: Effort normal and breath sounds normal. No respiratory distress.  Abdominal: He exhibits no distension and no mass. There is no tenderness.  Musculoskeletal: He exhibits no edema.  Neurological: He is alert and oriented to person, place, and time.  Skin: Skin is warm. No rash noted.  Psychiatric: Memory, affect and judgment normal.    Lab Results  Component Value Date   TSH 1.141 01/02/2012   Lab Results  Component Value Date   WBC 6.0 01/03/2012   HGB 13.7 01/03/2012   HCT 38.7* 01/03/2012   MCV 89.0 01/03/2012   PLT 172 01/03/2012   Lab Results  Component Value Date   CREATININE 0.86 01/03/2012   BUN 13 01/03/2012   NA 139 01/03/2012   K 3.6 01/03/2012   CL 102 01/03/2012   CO2 28 01/03/2012   Lab Results  Component Value Date   ALT 16 01/03/2012   AST 22 01/03/2012   ALKPHOS 51 01/03/2012   BILITOT 0.5 01/03/2012   Lab  Results  Component Value Date   CHOL 148 01/03/2012   Lab Results  Component Value Date   HDL 61 01/03/2012   Lab Results  Component Value Date   LDLCALC 74 01/03/2012   Lab Results  Component Value Date   TRIG 67 01/03/2012   Lab Results  Component Value Date   CHOLHDL 2.4 01/03/2012     Assessment &  Plan  GERD (gastroesophageal reflux disease) Generally well controlled on Pantoprazole bid, encouraged to get a bottle of Mylanta to try prn with chest pain  Hypothyroidism Well treated on last check, continue current dose of med and recheck with next visit.  Hyperlipidemia Tolerating pravastatin, encouraged avoid trans fats and increase exercise  CAD (coronary artery disease) Has not needed any ntg but his rx is expired, given refill today  Testosterone deficiency Tolerating Axiron but struggling with some minor skin irritation, encouraged a mild antiperspirant and cleanse with [employer].   Chest pain Atypical but with risk factos is given refill on NtG and referred to cardiology for consideration. Seek immediate care if pain unresolved by ntg. Reflux is treated

## 2012-08-05 NOTE — Assessment & Plan Note (Signed)
Atypical but with risk factos is given refill on NtG and referred to cardiology for consideration. Seek immediate care if pain unresolved by ntg. Reflux is treated

## 2012-08-05 NOTE — Assessment & Plan Note (Signed)
Tolerating pravastatin, encouraged avoid trans fats and increase exercise

## 2012-08-05 NOTE — Assessment & Plan Note (Signed)
Well treated on last check, continue current dose of med and recheck with next visit.

## 2012-08-27 ENCOUNTER — Other Ambulatory Visit: Payer: Self-pay | Admitting: Internal Medicine

## 2012-08-28 NOTE — Telephone Encounter (Signed)
Rx printed and was called to [facility].

## 2012-09-25 DIAGNOSIS — H8309 Labyrinthitis, unspecified ear: Secondary | ICD-10-CM | POA: Diagnosis not present

## 2012-10-08 ENCOUNTER — Other Ambulatory Visit: Payer: Self-pay | Admitting: Internal Medicine

## 2012-10-09 NOTE — Telephone Encounter (Signed)
Please advise Ambien refill? Last RX was done on 05-29-12 quantity 30 with 4 refills.  If ok fax to [phone]

## 2012-11-05 ENCOUNTER — Other Ambulatory Visit: Payer: Self-pay | Admitting: Internal Medicine

## 2012-11-05 NOTE — Telephone Encounter (Signed)
Rx sent in to pharmacy. 

## 2012-11-26 ENCOUNTER — Telehealth: Payer: Self-pay | Admitting: Internal Medicine

## 2012-11-26 ENCOUNTER — Other Ambulatory Visit: Payer: Self-pay | Admitting: Internal Medicine

## 2012-11-26 NOTE — Telephone Encounter (Signed)
Please advise refills? Remeron Patient has appointment 01/30/13

## 2012-11-26 NOTE — Telephone Encounter (Signed)
Refill- zolpidem tartrate. Qty 30

## 2012-11-26 NOTE — Telephone Encounter (Signed)
OK to refill meds to cover til August appt. Please clarify who does the Remeron so 2 people are not doing it it appears to be historical

## 2012-11-26 NOTE — Telephone Encounter (Signed)
I left a message for pt to return my call about the Remeron. I will refill the other two medications

## 2012-11-26 NOTE — Telephone Encounter (Signed)
REFILL request to soon. Last RX was done on 10-08-12 quantity 30 with 1 refill

## 2012-11-27 DIAGNOSIS — R55 Syncope and collapse: Secondary | ICD-10-CM | POA: Diagnosis not present

## 2012-11-28 ENCOUNTER — Other Ambulatory Visit: Payer: Self-pay | Admitting: Internal Medicine

## 2012-12-05 ENCOUNTER — Other Ambulatory Visit: Payer: Self-pay

## 2012-12-05 MED ORDER — ZOLPIDEM TARTRATE 10 MG PO TABS: 10.0000 mg | ORAL_TABLET | Freq: Every evening | ORAL | Status: DC | PRN

## 2012-12-05 NOTE — Telephone Encounter (Signed)
Patient called and left a message stating that he would like his Ambien refilled before going on a trip this weekend?  Please advise refill? Last RX was done on 10-08-12 quantity 30 with 1 refill  If ok fax to [phone]

## 2013-01-01 ENCOUNTER — Telehealth: Payer: Self-pay | Admitting: Internal Medicine

## 2013-01-01 ENCOUNTER — Encounter: Payer: Self-pay | Admitting: Nurse Practitioner

## 2013-01-01 ENCOUNTER — Ambulatory Visit (INDEPENDENT_AMBULATORY_CARE_PROVIDER_SITE_OTHER): Payer: Medicare Other | Admitting: Nurse Practitioner

## 2013-01-01 VITALS — BP 115/72 | HR 53 | Temp 98.2°F | Resp 16 | Ht 70.0 in | Wt 153.0 lb

## 2013-01-01 DIAGNOSIS — B9789 Other viral agents as the cause of diseases classified elsewhere: Secondary | ICD-10-CM

## 2013-01-01 DIAGNOSIS — B349 Viral infection, unspecified: Secondary | ICD-10-CM

## 2013-01-01 NOTE — Telephone Encounter (Signed)
Patient Information:  Caller Name: Kashaun  Phone: [phone]  Patient: Demetrius, Mahler  Gender: Male  DOB: 1940-12-04  Age: 72 Years  PCP: [facility])  Office Follow Up:  Does the office need to follow up with this patient?: Yes  Instructions For The Office: OFFICE, can pt be worked into the schedule.  No appts found on the schedule.  Please follow up with patient  RN Note:  No appts available.  Pt really wants to be seen today  Symptoms  Reason For Call & Symptoms: pt reports low grade fever off and on, flushed cheeks, diminished appetite,  headache.  Generally not feeling well.  Reviewed Health History In EMR: Yes  Reviewed Medications In EMR: Yes  Reviewed Allergies In EMR: Yes  Reviewed Surgeries / Procedures: Yes  Date of Onset of Symptoms: 12/30/2012  Guideline(s) Used:  Headache  Disposition Per Guideline:   See Today in Office  Reason For Disposition Reached:   Patient wants to be seen  Advice Given:  N/A  Patient Will Follow Care Advice:  YES

## 2013-01-01 NOTE — Telephone Encounter (Signed)
Appointment scheduled today at OR with Maximino Sarin, NP

## 2013-01-01 NOTE — Patient Instructions (Signed)
I think you have a viral illness that will improve over the next few days. If you are not feeling better through the weekend, or begin to feel worse, please call for re-evaluation. Stay hydrated-sip on fluids throughout the day, rest for a few days.

## 2013-01-01 NOTE — Progress Notes (Signed)
  Subjective:    Patient ID: James [name], male    DOB: [birth year], 72 y.o.   MRN: [id number]  Headache  This is a new problem. The current episode started in the past 7 days. The problem occurs intermittently. The problem has been unchanged. The pain is located in the frontal region. The pain does not radiate. The pain quality is similar to prior headaches. The quality of the pain is described as aching. The pain is mild. Associated symptoms include anorexia, back pain (has had this pain in past, comes & goes), a fever and nausea. Pertinent negatives include no abdominal pain, coughing, dizziness, eye redness, rhinorrhea, sinus pressure, sore throat or swollen glands. Nothing aggravates the symptoms. He has tried nothing for the symptoms. (Recently stayed with sister who now has URI. Gives recent history of running 5k and got dehydrated and passed out with position change on evening of second day of running. Was evaluated at [facility] and labs normal.)      Review of Systems  Constitutional: Positive for fever and appetite change (anorexia). Negative for chills and fatigue.  HENT: Negative for congestion, sore throat, rhinorrhea, postnasal drip and sinus pressure.   Eyes: Negative for redness.  Respiratory: Negative for cough, chest tightness, shortness of breath and wheezing.   Cardiovascular: Negative for chest pain, palpitations and leg swelling.  Gastrointestinal: Positive for nausea, constipation and anorexia. Negative for abdominal pain and diarrhea.  Genitourinary: Negative for dysuria, frequency and flank pain.  Musculoskeletal: Positive for back pain (has had this pain in past, comes & goes).  Neurological: Positive for headaches. Negative for dizziness.       Objective:   Physical Exam  Vitals reviewed. Constitutional: He is oriented to person, place, and time. He appears well-developed and well-nourished. No distress.  HENT:  Head: Normocephalic and atraumatic.  Right Ear:  External ear normal.  Mouth/Throat: Oropharynx is clear and moist. No oropharyngeal exudate.  Cerumen L ear canal, unable to visualize TM  Eyes: Conjunctivae are normal. Right eye exhibits no discharge. Left eye exhibits no discharge.  Neck: Normal range of motion. No thyromegaly present.  Cardiovascular: Normal rate, regular rhythm and normal heart sounds.   No murmur heard. Pulmonary/Chest: Effort normal and breath sounds normal. No respiratory distress. He has no wheezes.  Abdominal: Soft. Bowel sounds are normal. He exhibits no distension and no mass. There is no tenderness. There is no rebound and no guarding.  Musculoskeletal: Normal range of motion.  Normal gait  Lymphadenopathy:    He has no cervical adenopathy.  Neurological: He is alert and oriented to person, place, and time.  Skin: Skin is warm and dry. Rash (1 cm circular discreet scaly rash with central clearing inner aspect L knee.) noted.  Psychiatric: He has a normal mood and affect. His behavior is normal. Thought content normal.          Assessment & Plan:  1 viral illness See instructions 2 ringworm apply lamisil or lotrimin as directed for at least 4 weeks.

## 2013-01-01 NOTE — Telephone Encounter (Signed)
Can you schedule this at OR please

## 2013-01-04 ENCOUNTER — Other Ambulatory Visit: Payer: Self-pay | Admitting: *Deleted

## 2013-01-04 MED ORDER — PANTOPRAZOLE SODIUM 40 MG PO TBEC: 40.0000 mg | DELAYED_RELEASE_TABLET | Freq: Two times a day (BID) | ORAL | Status: DC

## 2013-01-29 ENCOUNTER — Encounter: Payer: Medicare Other | Admitting: Family Medicine

## 2013-01-30 ENCOUNTER — Encounter: Payer: Self-pay | Admitting: Family Medicine

## 2013-01-30 ENCOUNTER — Ambulatory Visit (INDEPENDENT_AMBULATORY_CARE_PROVIDER_SITE_OTHER): Payer: Medicare Other | Admitting: Family Medicine

## 2013-01-30 VITALS — BP 102/72 | HR 47 | Temp 98.0°F | Ht 70.0 in | Wt 150.0 lb

## 2013-01-30 DIAGNOSIS — R351 Nocturia: Secondary | ICD-10-CM | POA: Diagnosis not present

## 2013-01-30 DIAGNOSIS — R55 Syncope and collapse: Secondary | ICD-10-CM

## 2013-01-30 DIAGNOSIS — I4891 Unspecified atrial fibrillation: Secondary | ICD-10-CM | POA: Diagnosis not present

## 2013-01-30 DIAGNOSIS — H8110 Benign paroxysmal vertigo, unspecified ear: Secondary | ICD-10-CM

## 2013-01-30 DIAGNOSIS — E039 Hypothyroidism, unspecified: Secondary | ICD-10-CM | POA: Diagnosis not present

## 2013-01-30 DIAGNOSIS — E291 Testicular hypofunction: Secondary | ICD-10-CM | POA: Diagnosis not present

## 2013-01-30 DIAGNOSIS — R7989 Other specified abnormal findings of blood chemistry: Secondary | ICD-10-CM

## 2013-01-30 DIAGNOSIS — E785 Hyperlipidemia, unspecified: Secondary | ICD-10-CM | POA: Diagnosis not present

## 2013-01-30 DIAGNOSIS — E349 Endocrine disorder, unspecified: Secondary | ICD-10-CM

## 2013-01-30 DIAGNOSIS — G4700 Insomnia, unspecified: Secondary | ICD-10-CM

## 2013-01-30 LAB — CBC
Hemoglobin: 14.1 g/dL (ref 13.0–17.0)
MCH: 31.3 pg (ref 26.0–34.0)
MCHC: 34.4 g/dL (ref 30.0–36.0)
Platelets: 204 10*3/uL (ref 150–400)

## 2013-01-30 MED ORDER — ZOLPIDEM TARTRATE 10 MG PO TABS: 10.0000 mg | ORAL_TABLET | Freq: Every evening | ORAL | Status: DC | PRN

## 2013-01-30 NOTE — Patient Instructions (Addendum)
Remember gatorade when running on hot days, fluids Protein with each every 4-5 hours and especially at bedtime Melatonin 5-10 mg at bedtime and try and minimize Ambien Call if any further episodes   Syncope Syncope is a fainting spell. This means the person loses consciousness and drops to the ground. The person is generally unconscious for less than 5 minutes. The person may have some muscle twitches for up to 15 seconds before waking up and returning to normal. Syncope occurs more often in elderly people, but it can happen to anyone. While most causes of syncope are not dangerous, syncope can be a sign of a serious medical problem. It is important to seek medical care.  CAUSES  Syncope is caused by a sudden decrease in blood flow to the brain. The specific cause is often not determined. Factors that can trigger syncope include:  Taking medicines that lower blood pressure.  Sudden changes in posture, such as standing up suddenly.  Taking more medicine than prescribed.  Standing in one place for too long.  Seizure disorders.  Dehydration and excessive exposure to heat.  Low blood sugar (hypoglycemia).  Straining to have a bowel movement.  Heart disease, irregular heartbeat, or other circulatory problems.  Fear, emotional distress, seeing blood, or severe pain. SYMPTOMS  Right before fainting, you may:  Feel dizzy or lightheaded.  Feel nauseous.  See all white or all black in your field of vision.  Have cold, clammy skin. DIAGNOSIS  Your caregiver will ask about your symptoms, perform a physical exam, and perform electrocardiography (ECG) to record the electrical activity of your heart. Your caregiver may also perform other heart or blood tests to determine the cause of your syncope. TREATMENT  In most cases, no treatment is needed. Depending on the cause of your syncope, your caregiver may recommend changing or stopping some of your medicines. HOME CARE  INSTRUCTIONS  Have someone stay with you until you feel stable.  Do not drive, operate machinery, or play sports until your caregiver says it is okay.  Keep all follow-up appointments as directed by your caregiver.  Lie down right away if you start feeling like you might faint. Breathe deeply and steadily. Wait until all the symptoms have passed.  Drink enough fluids to keep your urine clear or pale yellow.  If you are taking blood pressure or heart medicine, get up slowly, taking several minutes to sit and then stand. This can reduce dizziness. SEEK IMMEDIATE MEDICAL CARE IF:   You have a severe headache.  You have unusual pain in the chest, abdomen, or back.  You are bleeding from the mouth or rectum, or you have black or tarry stool.  You have an irregular or very fast heartbeat.  You have pain with breathing.  You have repeated fainting or seizure-like jerking during an episode.  You faint when sitting or lying down.  You have confusion.  You have difficulty walking.  You have severe weakness.  You have vision problems. If you fainted, call your local emergency services (911 in [place] Do not drive yourself to the hospital.  MAKE SURE YOU:  Understand these instructions.  Will watch your condition.  Will get help right away if you are not doing well or get worse. Document Released: 06/06/2005 Document Revised: 12/06/2011 Document Reviewed: 08/05/2011 [facility] Patient Information 2014 [place], [place].

## 2013-01-31 LAB — RENAL FUNCTION PANEL
BUN: 15 mg/dL (ref 6–23)
Calcium: 9.7 mg/dL (ref 8.4–10.5)
Chloride: 99 mEq/L (ref 96–112)
Creat: 1.03 mg/dL (ref 0.50–1.35)
Glucose, Bld: 90 mg/dL (ref 70–99)
Phosphorus: 3.4 mg/dL (ref 2.3–4.6)

## 2013-01-31 LAB — HEPATIC FUNCTION PANEL
ALT: 16 U/L (ref 0–53)
AST: 21 U/L (ref 0–37)
Albumin: 4 g/dL (ref 3.5–5.2)
Alkaline Phosphatase: 56 U/L (ref 39–117)
Indirect Bilirubin: 0.5 mg/dL (ref 0.0–0.9)
Total Protein: 6.9 g/dL (ref 6.0–8.3)

## 2013-01-31 LAB — LIPID PANEL
Cholesterol: 152 mg/dL (ref 0–200)
HDL: 59 mg/dL (ref >39)
LDL Cholesterol: 72 mg/dL (ref 0–99)
Triglycerides: 103 mg/dL (ref <150)
VLDL: 21 mg/dL (ref 0–40)

## 2013-01-31 LAB — PSA: PSA: 0.88 ng/mL (ref <=4.00)

## 2013-01-31 LAB — TESTOSTERONE: Testosterone: 461 ng/dL (ref 300–890)

## 2013-02-01 ENCOUNTER — Ambulatory Visit (HOSPITAL_COMMUNITY): Payer: Medicare Other | Attending: Family Medicine | Admitting: Radiology

## 2013-02-01 ENCOUNTER — Other Ambulatory Visit (HOSPITAL_COMMUNITY): Payer: Self-pay | Admitting: Family Medicine

## 2013-02-01 DIAGNOSIS — I2510 Atherosclerotic heart disease of native coronary artery without angina pectoris: Secondary | ICD-10-CM | POA: Insufficient documentation

## 2013-02-01 DIAGNOSIS — R55 Syncope and collapse: Secondary | ICD-10-CM

## 2013-02-01 DIAGNOSIS — I4891 Unspecified atrial fibrillation: Secondary | ICD-10-CM | POA: Diagnosis not present

## 2013-02-01 DIAGNOSIS — E785 Hyperlipidemia, unspecified: Secondary | ICD-10-CM | POA: Diagnosis not present

## 2013-02-01 NOTE — Progress Notes (Signed)
Echocardiogram performed.  

## 2013-02-03 ENCOUNTER — Encounter: Payer: Self-pay | Admitting: Family Medicine

## 2013-02-03 DIAGNOSIS — R55 Syncope and collapse: Secondary | ICD-10-CM | POA: Insufficient documentation

## 2013-02-03 HISTORY — DX: Syncope and collapse: R55

## 2013-02-03 NOTE — Assessment & Plan Note (Signed)
controlled 

## 2013-02-03 NOTE — Assessment & Plan Note (Signed)
Well treated with Pravastatin, no changes

## 2013-02-03 NOTE — Progress Notes (Signed)
Patient ID: James [name], male   DOB: 1940-07-29, 72 y.o.   MRN: [id number] James [name] [id number] Apr 17, 1941 02/03/2013      Progress Note-Follow Up  Subjective  Chief Complaint  Chief Complaint  Patient presents with  . Annual Exam    physical    HPI  Caucasian male who is in today for routine medical care. She is complaining of 2 episodes of syncope in the last 3 months. One sounds as if he fully lost consciousness and fell to the floor. The other ones presyncopal but he was disoriented for a few minutes. He reports that both occurred after extensive exercise when not hydrating and eating well. He is also complaining of some recent increase in feeling flushed. He is complaining of some fatigue and lightheadedness at times as well as poor appetite. Has had intermittent headaches and nausea and no fever or chills. Does continue to exercise 3 times a week and generally tolerates that well. No chest pain or palpitations. No shortness or breath or GU complaints.  Past Medical History  Diagnosis Date  . CAD (coronary artery disease)     nonobstructive by cath 2008; nonobstructive by Cardiac CT 11/2011  . Hypothyroidism   . Atrial fibrillation   . Diverticulosis   . Mitral valve prolapse   . HLD (hyperlipidemia)   . GERD (gastroesophageal reflux disease)   . Hiatal hernia   . Cervical spine fracture 1980    C6-7  . 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 History of squamous cell carcinoma 10-05-10    Dr Cristopher Estimable, [place]  . 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  . Syncope 02/03/2013    Past Surgical History  Procedure Laterality Date  . Appendectomy  05/2011  . Inguinal hernia repair  07/2010    right  . Knee arthroplasty      bilat  . Cardiac catheterization  01/03/12    30% ostial LAD stenosis, ostial 30-40% D1 stenosis, smooth and normal left main and RCA; LVEF 65%  . Tonsillectomy  1947  . Tumor removal  1958    "fatty tumor"    Family History   Problem Relation Age of Onset  . Heart disease Mother     MI 36s  . Stroke Mother   . Hypertension Mother   . Heart disease Maternal Grandfather     MI 70-80s  . Heart disease Maternal Grandmother     MI 70-80s  . Lung disease Father     penumonitis  . Cancer Sister     colon  . Cancer Paternal Uncle     GI: colon he thinks    History   Social History  . Marital Status: Single    Spouse Name: N/A    Number of Children: N/A  . Years of Education: N/A   Occupational History  . Art gallery manager     Retired   Social History Main Topics  . Smoking status: Never Smoker   . Smokeless tobacco: Not on file  . Alcohol Use: Yes     Comment: 2 glasses red wine/day  . Drug Use: No  . Sexual Activity: Yes   Other Topics Concern  . Not on file   Social History Narrative  . No narrative on file    Current Outpatient Prescriptions on File Prior to Visit  Medication Sig Dispense Refill  . aspirin 81 MG tablet Take 81 mg by mouth daily.      Randa Ngo 30  MG/ACT SOLN APPLY TO AFFECTED AREA 1 PUMP UNDER EACHARM EVERY MORNING  90 mL  5  . Calcium Carb-Cholecalciferol (CALCIUM 1000 + D PO) Take 1 tablet by mouth daily.      Marland Kitchen JALYN 0.5-0.4 MG CAPS TAKE 1 CAPSULE BY MOUTH EVERY NIGHT AT BEDTIME  90 capsule  1  . levothyroxine (SYNTHROID, LEVOTHROID) 100 MCG tablet Take 1 tablet (100 mcg total) by mouth daily before breakfast.  30 tablet  2  . mirtazapine (REMERON) 15 MG tablet TAKE 1 TABLET BY MOUTH EVERY NIGHT AT BEDTIME  90 tablet  1  . Multiple Vitamins-Minerals (MULTIVITAMINS THER. W/MINERALS) TABS Take 1 tablet by mouth daily.      . nitroGLYCERIN (NITROSTAT) 0.4 MG SL tablet Place 1 tablet (0.4 mg total) under the tongue every 5 (five) minutes x 3 doses as needed for chest pain.  25 tablet  3  . pantoprazole (PROTONIX) 40 MG tablet Take 1 tablet (40 mg total) by mouth 2 (two) times daily.  60 tablet  4  . pravastatin (PRAVACHOL) 20 MG tablet TAKE 1 TABLET BY MOUTH EVERY NIGHT AT BEDTIME   90 tablet  1  . sildenafil (VIAGRA) 100 MG tablet Take 100 mg by mouth daily as needed for erectile dysfunction.      . Ferrous Sulfate (IRON) 325 (65 FE) MG TABS Take 1 tablet by mouth daily.      . Vitamin B Complex-C CAPS Take 1 capsule by mouth daily.       No current facility-administered medications on file prior to visit.    Allergies  Allergen Reactions  . Sulfa Antibiotics Other (See Comments)    Childhood allergy    Review of Systems  Review of Systems  Constitutional: Positive for malaise/fatigue. Negative for fever.  HENT: Negative for congestion.   Eyes: Negative for discharge.  Respiratory: Negative for shortness of breath.   Cardiovascular: Negative for chest pain, palpitations and leg swelling.  Gastrointestinal: Negative for nausea, abdominal pain and diarrhea.  Genitourinary: Negative for dysuria.  Musculoskeletal: Negative for falls.  Skin: Negative for rash.  Neurological: Positive for dizziness and loss of consciousness. Negative for headaches.  Endo/Heme/Allergies: Negative for polydipsia.  Psychiatric/Behavioral: Negative for depression and suicidal ideas. The patient is not nervous/anxious and does not have insomnia.     Objective  BP 102/72  Pulse 47  Temp(Src) 98 F (36.7 C) (Oral)  Ht 5\' 10"  (1.778 m)  Wt 150 lb (68.04 kg)  BMI 21.52 kg/m2  SpO2 97%  Physical Exam  Physical Exam  Constitutional: He is oriented to person, place, and time and well-developed, well-nourished, and in no distress. No distress.  HENT:  Head: Normocephalic and atraumatic.  Eyes: Conjunctivae are normal.  Neck: Neck supple. No thyromegaly present.  Cardiovascular: Regular rhythm.   Murmur heard. bradycardia  Pulmonary/Chest: Effort normal and breath sounds normal. No respiratory distress.  Abdominal: He exhibits no distension and no mass. There is no tenderness.  Musculoskeletal: He exhibits no edema.  Neurological: He is alert and oriented to person, place,  and time.  Skin: Skin is warm.  Psychiatric: Memory, affect and judgment normal.    Lab Results  Component Value Date   TSH 0.247* 01/30/2013   Lab Results  Component Value Date   WBC 4.7 01/30/2013   HGB 14.1 01/30/2013   HCT 41.0 01/30/2013   MCV 90.9 01/30/2013   PLT 204 01/30/2013   Lab Results  Component Value Date   CREATININE 1.03 01/30/2013  BUN 15 01/30/2013   NA 135 01/30/2013   K 4.5 01/30/2013   CL 99 01/30/2013   CO2 31 01/30/2013   Lab Results  Component Value Date   ALT 16 01/30/2013   AST 21 01/30/2013   ALKPHOS 56 01/30/2013   BILITOT 0.7 01/30/2013   Lab Results  Component Value Date   CHOL 152 01/30/2013   Lab Results  Component Value Date   HDL 59 01/30/2013   Lab Results  Component Value Date   LDLCALC 72 01/30/2013   Lab Results  Component Value Date   TRIG 103 01/30/2013   Lab Results  Component Value Date   CHOLHDL 2.6 01/30/2013     Assessment & Plan  Atrial fibrillation controlled  Testosterone deficiency Chooses not to treat  Hypothyroidism Well treated. TSH mildly suppressed but no symptoms, will recheck in 10 to 12 weeks or sooner if symptoms are concerning  Hyperlipidemia Well treated with Pravastatin, no changes  Syncope 2 episodes both after intense exercise. He attibutes them to dehydration, he also skips meals at times. He is encouraged to hydrate and eat better but he is also referred to cardiology for further consideration due to his echo results which shows some worsening of Aortic disease.

## 2013-02-03 NOTE — Assessment & Plan Note (Signed)
Well treated. TSH mildly suppressed but no symptoms, will recheck in 10 to 12 weeks or sooner if symptoms are concerning

## 2013-02-03 NOTE — Assessment & Plan Note (Signed)
2 episodes both after intense exercise. He attibutes them to dehydration, he also skips meals at times. He is encouraged to hydrate and eat better but he is also referred to cardiology for further consideration due to his echo results which shows some worsening of Aortic disease.

## 2013-02-03 NOTE — Assessment & Plan Note (Signed)
Chooses not to treat.

## 2013-02-04 ENCOUNTER — Other Ambulatory Visit: Payer: Self-pay | Admitting: *Deleted

## 2013-02-04 ENCOUNTER — Telehealth: Payer: Self-pay

## 2013-02-04 ENCOUNTER — Encounter (INDEPENDENT_AMBULATORY_CARE_PROVIDER_SITE_OTHER): Payer: Medicare Other

## 2013-02-04 ENCOUNTER — Telehealth: Payer: Self-pay | Admitting: *Deleted

## 2013-02-04 DIAGNOSIS — I6529 Occlusion and stenosis of unspecified carotid artery: Secondary | ICD-10-CM | POA: Diagnosis not present

## 2013-02-04 DIAGNOSIS — R55 Syncope and collapse: Secondary | ICD-10-CM | POA: Diagnosis not present

## 2013-02-04 MED ORDER — LEVOTHYROXINE SODIUM 100 MCG PO TABS: 100.0000 ug | ORAL_TABLET | Freq: Every day | ORAL | Status: DC

## 2013-02-04 NOTE — Telephone Encounter (Signed)
Rx request to pharmacy per pt request/SLS

## 2013-02-04 NOTE — Telephone Encounter (Signed)
Message copied by Court Joy on Mon Feb 04, 2013 10:30 AM ------      Message from: Danise Edge A      Created: Sun Feb 03, 2013  7:31 PM       I have set up his cardiology referral which he argued against in his visit but his echo shows worsening Aortic disease so he needs referral, please notify ------

## 2013-02-04 NOTE — Telephone Encounter (Signed)
PA request received form pharmacy for Zolpidem via [employer] @[phone]; phone number given & forwarded to [facility], which in turn forward me to [facility] Clinical Call Service @ [phone]. Spoke with Lanora Manis, who processed prior authorization for Rx and gave Approval dated from 07.18.2014 thru 08.18.2015; pharmacy informed, ran Rx successfully [stated 0.47 cent OOP cost]; pt informed/SLS

## 2013-02-04 NOTE — Telephone Encounter (Signed)
Pt states he has already been contacted and has an appt tomorrow

## 2013-02-04 NOTE — Progress Notes (Signed)
Quick Note:  Patient Informed and voiced understanding.  Pt states he has an appt tomorrow ______

## 2013-02-05 ENCOUNTER — Encounter: Payer: Self-pay | Admitting: Nurse Practitioner

## 2013-02-05 ENCOUNTER — Ambulatory Visit (INDEPENDENT_AMBULATORY_CARE_PROVIDER_SITE_OTHER): Payer: Medicare Other | Admitting: Nurse Practitioner

## 2013-02-05 VITALS — BP 120/80 | HR 54 | Ht 70.0 in | Wt 150.8 lb

## 2013-02-05 DIAGNOSIS — R5381 Other malaise: Secondary | ICD-10-CM

## 2013-02-05 DIAGNOSIS — R5383 Other fatigue: Secondary | ICD-10-CM

## 2013-02-05 DIAGNOSIS — R531 Weakness: Secondary | ICD-10-CM

## 2013-02-05 NOTE — Progress Notes (Signed)
James [name] Date of Birth: 10-15-1940 Medical Record #[id number]  History of Present Illness: James [name] is seen back today for a work in visit. Seen for Dr. Elease Hashimoto. He has known CAD, hypothyroidism, HLD and GERD. I have seen him last year after he presented to the ER after having an stress test in [place]. There was concern for atrial fib at the peak of exercise on his stress test that resolved spontaneously but no ischemia or infarct noted with normal EF. An echo showed normal LV function and no wall motion abnormalities. A cardiac CTA showed CAD with normal EF. He had an event monitor which just showed sinus tachycardia and PVC's. He has had NO further documentation of atrial fib. He presented with chest pain and underwent cardiac cath with Dr. Elease Hashimoto. He has mild but nonobstructive CAD. EF is normal. He was to be managed medically. He has a resting bradycardia. He is a runner.   Last seen here back in September of 2013. Felt to be doing well.  Comes back today. Here with his sister. Has a multitude of complaints. Was referred back here by his PCP due to syncope - has occurred after a period of strenuous activities and in the setting of NOT being hydrated and skipping meals. EKG at his PCP's showed sinus bradycardia. He tells me that he has had 3 spells over the course of the past 8 months (he thinks) where after getting up at night to go to the bathroom he got weak and had to lie down on the floor. He had voided after each spell. Felt hot. No N/V. The last one occurred in June and he says he passed out. No chest pain during those times. Was not dizzy. Admits that this was after a period of extreme exercise without hydration or proper nutrition. He has constant chest pain - worse with just a minimal amount of stress - has known esophagitis - on PPI - remote endo over 5 years ago. No chest pain with running/exercise. Notes that he seems to be flushed in the face more. Not sleeping well and seems upset  that he did not have enough ambien on hand.  He continues to consume alcohol on a daily basis - says this makes his chest feel better. Echo results are noted. Mild ascending aorta dilatation at 4.1. Carotids are pending review.   Current Outpatient Prescriptions  Medication Sig Dispense Refill  . aspirin 81 MG tablet Take 81 mg by mouth daily.      James [name] 30 MG/ACT SOLN APPLY TO AFFECTED AREA 1 PUMP UNDER EACHARM EVERY MORNING  90 mL  5  . Calcium Carb-Cholecalciferol (CALCIUM 1000 + D PO) Take 1 tablet by mouth daily.      . Ibuprofen-Diphenhydramine HCl (ADVIL PM) 200-25 MG CAPS Take 25 mg by mouth as needed.      Marland Kitchen JALYN 0.5-0.4 MG CAPS TAKE 1 CAPSULE BY MOUTH EVERY NIGHT AT BEDTIME  90 capsule  1  . levothyroxine (SYNTHROID, LEVOTHROID) 100 MCG tablet Take 100 mcg by mouth daily before breakfast. Takes medication 6 days a week      . mirtazapine (REMERON) 15 MG tablet TAKE 1 TABLET BY MOUTH EVERY NIGHT AT BEDTIME  90 tablet  1  . Multiple Vitamins-Minerals (MULTIVITAMINS THER. W/MINERALS) TABS Take 1 tablet by mouth daily.      . nitroGLYCERIN (NITROSTAT) 0.4 MG SL tablet Place 1 tablet (0.4 mg total) under the tongue every 5 (five) minutes x 3 doses  as needed for chest pain.  25 tablet  3  . pantoprazole (PROTONIX) 40 MG tablet Take 1 tablet (40 mg total) by mouth 2 (two) times daily.  60 tablet  4  . pravastatin (PRAVACHOL) 20 MG tablet TAKE 1 TABLET BY MOUTH EVERY NIGHT AT BEDTIME  90 tablet  1  . sildenafil (VIAGRA) 100 MG tablet Take 100 mg by mouth daily as needed for erectile dysfunction.      Marland Kitchen zolpidem (AMBIEN) 10 MG tablet Take 1 tablet (10 mg total) by mouth at bedtime as needed for sleep.  30 tablet  5   No current facility-administered medications for this visit.    Allergies  Allergen Reactions  . Sulfa Antibiotics Other (See Comments)    Childhood allergy    Past Medical History  Diagnosis Date  . CAD (coronary artery disease)     nonobstructive by cath 2008;  nonobstructive by Cardiac CT 11/2011  . Hypothyroidism   . Atrial fibrillation   . Diverticulosis   . Mitral valve prolapse   . HLD (hyperlipidemia)   . GERD (gastroesophageal reflux disease)   . Hiatal hernia   . Cervical spine fracture 1980    C6-7  . 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 History of squamous cell carcinoma 10-05-10    Dr Cristopher Estimable, [place]  . 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  . Syncope 02/03/2013    Past Surgical History  Procedure Laterality Date  . Appendectomy  05/2011  . Inguinal hernia repair  07/2010    right  . Knee arthroplasty      bilat  . Cardiac catheterization  01/03/12    30% ostial LAD stenosis, ostial 30-40% D1 stenosis, smooth and normal left main and RCA; LVEF 65%  . Tonsillectomy  1947  . Tumor removal  1958    "fatty tumor"    History  Smoking status  . Never Smoker   Smokeless tobacco  . Not on file    History  Alcohol Use  . Yes    Comment: 2 glasses red wine/day    Family History  Problem Relation Age of Onset  . Heart disease Mother     MI 26s  . Stroke Mother   . Hypertension Mother   . Heart disease Maternal Grandfather     MI 70-80s  . Heart disease Maternal Grandmother     MI 70-80s  . Lung disease Father     penumonitis  . Cancer Sister     colon  . Cancer Paternal Uncle     GI: colon he thinks    Review of Systems: The review of systems is per the HPI.  All other systems were reviewed and are negative.  Physical Exam: BP 120/80  Pulse 54  Ht 5\' 10"  (1.778 m)  Wt 150 lb 12.8 oz (68.402 kg)  BMI 21.64 kg/m2 Patient is very pleasant and in no acute distress. He is thin. Skin is warm and dry. Color is normal.  HEENT is unremarkable. Normocephalic/atraumatic. PERRL. Sclera are nonicteric. Neck is supple. No masses. No JVD. Lungs are clear. Cardiac exam shows a regular rate and rhythm. Abdomen is soft. Extremities are without edema. Gait and ROM are intact. No gross neurologic deficits  noted.  LABORATORY DATA: Lab Results  Component Value Date   WBC 4.7 01/30/2013   HGB 14.1 01/30/2013   HCT 41.0 01/30/2013   PLT 204 01/30/2013   GLUCOSE 90 01/30/2013   CHOL 152 01/30/2013  TRIG 103 01/30/2013   HDL 59 01/30/2013   LDLCALC 72 01/30/2013   ALT 16 01/30/2013   AST 21 01/30/2013   NA 135 01/30/2013   K 4.5 01/30/2013   CL 99 01/30/2013   CREATININE 1.03 01/30/2013   BUN 15 01/30/2013   CO2 31 01/30/2013   TSH 0.247* 01/30/2013   PSA 0.88 01/30/2013   INR 1.07 01/02/2012   Echo Study Conclusions from August 2014  - Left ventricle: The cavity size was normal. Wall thickness was normal. Systolic function was normal. The estimated ejection fraction was in the range of 55% to 60%. Doppler parameters are consistent with abnormal left ventricular relaxation (grade 1 diastolic dysfunction). - Aorta: Ascending aorta is mildly dilated at 41 mm. - Mitral valve: Mild regurgitation. - Right ventricle: Systolic function was mildly reduced.  Coronary Angiography from July 2013  Left Main: Smooth and normal  Left anterior Descending: mild calcification. There is a 30% stenosis at the origen of the LAD. The remaining LAD is smooth and normal. The 1st diag. Has a 30% - 40% stenosis at the origin.  Left Circumflex: large. Provides primarily a 1st OM distribution. Smooth and normal.  Right Coronary Artery: large, dominant, smooth and normal  LV Gram: normal LV function. EF 60%  Complications: No apparent complications  Patient did tolerate procedure well.   Conclusions:  1. Minor luminal irregularities. No flow limiting stenosis.  2. Normal LV function.   Vesta Mixer, Montez Hageman., MD, [facility]  01/03/2012, 11:48 AM  Office - [phone]  Pager [phone]   Assessment / Plan: 1. "Weak spells" ? Syncope - he had an extensive work up last year. Has had past negative Event monitor as well. No documented AF since his stress test in [place]. Sounds like this is from his poor hydration and poor  nutrition. He says he will try to "do better" - if spells continue, may need loop recorder. Carotid doppler is pending.   2. Multitude of complaints - I think a lot of this stems from alcohol. I get the feeling from the sister that he may be medicating with alcohol. This can certainly interrupt sleep patterns and aggravate GERD. I do not think his chest pain is cardiac given that it improves with alcohol intake. I have asked him to work on lifestyle changes. May benefit from counseling, going back to GI if he is not able to make changes on his own.   3. 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 Would recommend CTA of the chest in 6 to 12 months to reassess.   I will ask him to see Dr. Elease Hashimoto in 6 months.   Patient is agreeable to this plan and will call if any problems develop in the interim.   Rosalio Macadamia, RN, ANP-C [facility] [address] [place], [place]  [address]

## 2013-02-05 NOTE — Patient Instructions (Signed)
Continue with your current medicines  I would encourage you to think about what we talked about today  If you have more spells, let us know  I would like for you to see Dr. Elease Hashimoto in 6 months - will consider CT scan of your chest at that time  Consider seeing the GI doctor again  Call the [facility] office at [phone] if you have any questions, problems or concerns.

## 2013-02-08 NOTE — Progress Notes (Signed)
Quick Note:  Patient Informed and voiced understanding ______ 

## 2013-02-25 ENCOUNTER — Telehealth: Payer: Self-pay | Admitting: *Deleted

## 2013-02-25 NOTE — Telephone Encounter (Signed)
Pt called stating that his pantoprazole "is not being authorized for BID", reports he is out of medication and would like status of authorization/SLS

## 2013-02-26 NOTE — Telephone Encounter (Signed)
I tried to call patient and left a message for him to return my call.   I looked and Debby Freiberg in Cardiology is who RX'd this. Pt needs to call there office to get PA done.

## 2013-03-01 NOTE — Telephone Encounter (Signed)
Patient left vm returning your call. °

## 2013-03-04 NOTE — Telephone Encounter (Signed)
Patient informed and states he has just been taking 1 a day. Patient stated he was starting to have some discomfort in his chest " like esophageal" issues.   I advised pt to call the cardiology office, that they may have to do the PA to get this medication approved for bid to do what its supposed to .  Pt voiced understanding and stated he would call

## 2013-03-28 ENCOUNTER — Other Ambulatory Visit: Payer: Self-pay | Admitting: Family

## 2013-03-28 NOTE — Telephone Encounter (Signed)
RX faxed to pharmacy.

## 2013-03-28 NOTE — Telephone Encounter (Signed)
He has to be seen for his symptoms it can be an ear infection but it can be other things as well. OK to refill Axiron. I will approve

## 2013-03-28 NOTE — Telephone Encounter (Signed)
Pt states that he would like MD to call in something for his inner ear infection. Pt states he has dizziness, nausea and headaches X 2-3 days.   There is also an RX request for Axiron? Last RX was done on 08-27-12 quantity 90 ml with 5 refills.   Please advise both?   RX can be faxed to [phone]

## 2013-03-28 NOTE — Telephone Encounter (Signed)
Pt informed and states that it feels better when it takes the Antivert. Pt states he has company coming in town so he will call the first of the week if it gets worse

## 2013-04-02 ENCOUNTER — Ambulatory Visit (INDEPENDENT_AMBULATORY_CARE_PROVIDER_SITE_OTHER): Payer: Medicare Other | Admitting: Family Medicine

## 2013-04-02 ENCOUNTER — Encounter: Payer: Self-pay | Admitting: Family Medicine

## 2013-04-02 VITALS — BP 122/88 | HR 48 | Temp 98.0°F | Ht 70.0 in | Wt 154.0 lb

## 2013-04-02 DIAGNOSIS — H6120 Impacted cerumen, unspecified ear: Secondary | ICD-10-CM | POA: Diagnosis not present

## 2013-04-02 DIAGNOSIS — H6122 Impacted cerumen, left ear: Secondary | ICD-10-CM

## 2013-04-02 DIAGNOSIS — Z23 Encounter for immunization: Secondary | ICD-10-CM

## 2013-04-02 DIAGNOSIS — H8110 Benign paroxysmal vertigo, unspecified ear: Secondary | ICD-10-CM

## 2013-04-02 DIAGNOSIS — R42 Dizziness and giddiness: Secondary | ICD-10-CM | POA: Diagnosis not present

## 2013-04-02 DIAGNOSIS — H60399 Other infective otitis externa, unspecified ear: Secondary | ICD-10-CM | POA: Diagnosis not present

## 2013-04-02 DIAGNOSIS — I4891 Unspecified atrial fibrillation: Secondary | ICD-10-CM

## 2013-04-02 DIAGNOSIS — H60392 Other infective otitis externa, left ear: Secondary | ICD-10-CM

## 2013-04-02 MED ORDER — NEOMYCIN-POLYMYXIN-HC 3.5-10000-1 OT SOLN
3.0000 [drp] | Freq: Three times a day (TID) | OTIC | Status: DC
Start: 2013-04-02 — End: 2013-10-15

## 2013-04-02 MED ORDER — MECLIZINE HCL 25 MG PO TABS: 25.0000 mg | ORAL_TABLET | Freq: Two times a day (BID) | ORAL | Status: DC | PRN

## 2013-04-02 NOTE — Patient Instructions (Signed)
Dig[employer]ive [employer]age probiotic daily  Vertigo Vertigo means you feel like you or your surroundings are moving when they are not. Vertigo can be dangerous if it occurs when you are at work, driving, or performing difficult activities.  CAUSES  Vertigo occurs when there is a conflict of signals sent to your brain from the visual and sensory systems in your body. There are many different causes of vertigo, including:  Infections, especially in the inner ear.  A bad reaction to a drug or misuse of alcohol and medicines.  Withdrawal from drugs or alcohol.  Rapidly changing positions, such as lying down or rolling over in bed.  A migraine headache.  Decreased blood flow to the brain.  Increased pressure in the brain from a head injury, infection, tumor, or bleeding. SYMPTOMS  You may feel as though the world is spinning around or you are falling to the ground. Because your balance is upset, vertigo can cause nausea and vomiting. You may have involuntary eye movements (nystagmus). DIAGNOSIS  Vertigo is usually diagnosed by physical exam. If the cause of your vertigo is unknown, your caregiver may perform imaging tests, such as an MRI scan (magnetic resonance imaging). TREATMENT  Most cases of vertigo resolve on their own, without treatment. Depending on the cause, your caregiver may prescribe certain medicines. If your vertigo is related to body position issues, your caregiver may recommend movements or procedures to correct the problem. In rare cases, if your vertigo is caused by certain inner ear problems, you may need surgery. HOME CARE INSTRUCTIONS   Follow your caregiver's instructions.  Avoid driving.  Avoid operating heavy machinery.  Avoid performing any tasks that would be dangerous to you or others during a vertigo episode.  Tell your caregiver if you notice that certain medicines seem to be causing your vertigo. Some of the medicines used to treat vertigo episodes can actually  make them worse in some people. SEEK IMMEDIATE MEDICAL CARE IF:   Your medicines do not relieve your vertigo or are making it worse.  You develop problems with talking, walking, weakness, or using your arms, hands, or legs.  You develop severe headaches.  Your nausea or vomiting continues or gets worse.  You develop visual changes.  A family member notices behavioral changes.  Your condition gets worse. MAKE SURE YOU:  Understand these instructions.  Will watch your condition.  Will get help right away if you are not doing well or get worse. Document Released: 03/16/2005 Document Revised: 08/29/2011 Document Reviewed: 12/23/2010 [facility] Patient Information 2014 [place], [place]. Otitis Externa Otitis externa is a bacterial or fungal infection of the outer ear canal. This is the area from the eardrum to the outside of the ear. Otitis externa is sometimes called "swimmer's ear." CAUSES  Possible causes of infection include:  Swimming in dirty water.  Moisture remaining in the ear after swimming or bathing.  Mild injury (trauma) to the ear.  Objects stuck in the ear (foreign body).  Cuts or scrapes (abrasions) on the outside of the ear. SYMPTOMS  The first symptom of infection is often itching in the ear canal. Later signs and symptoms may include swelling and redness of the ear canal, ear pain, and yellowish-white fluid (pus) coming from the ear. The ear pain may be worse when pulling on the earlobe. DIAGNOSIS  Your caregiver will perform a physical exam. A sample of fluid may be taken from the ear and examined for bacteria or fungi. TREATMENT  Antibiotic ear drops are often  given for 10 to 14 days. Treatment may also include pain medicine or corticosteroids to reduce itching and swelling. PREVENTION   Keep your ear dry. Use the corner of a towel to absorb water out of the ear canal after swimming or bathing.  Avoid scratching or putting objects inside your ear. This  can damage the ear canal or remove the protective wax that lines the canal. This makes it easier for bacteria and fungi to grow.  Avoid swimming in lakes, polluted water, or poorly chlorinated pools.  You may use ear drops made of rubbing alcohol and vinegar after swimming. Combine equal parts of white vinegar and alcohol in a bottle. Put 3 or 4 drops into each ear after swimming. HOME CARE INSTRUCTIONS   Apply antibiotic ear drops to the ear canal as prescribed by your caregiver.  Only take over-the-counter or prescription medicines for pain, discomfort, or fever as directed by your caregiver.  If you have diabetes, follow any additional treatment instructions from your caregiver.  Keep all follow-up appointments as directed by your caregiver. SEEK MEDICAL CARE IF:   You have a fever.  Your ear is still red, swollen, painful, or draining pus after 3 days.  Your redness, swelling, or pain gets worse.  You have a severe headache.  You have redness, swelling, pain, or tenderness in the area behind your ear. MAKE SURE YOU:   Understand these instructions.  Will watch your condition.  Will get help right away if you are not doing well or get worse. Document Released: 06/06/2005 Document Revised: 08/29/2011 Document Reviewed: 06/23/2011 [facility] Patient Information 2014 [place], [place].

## 2013-04-03 ENCOUNTER — Telehealth: Payer: Self-pay

## 2013-04-03 ENCOUNTER — Telehealth: Payer: Self-pay | Admitting: *Deleted

## 2013-04-03 MED ORDER — PANTOPRAZOLE SODIUM 40 MG PO TBEC: 40.0000 mg | DELAYED_RELEASE_TABLET | Freq: Two times a day (BID) | ORAL | Status: DC

## 2013-04-03 NOTE — Telephone Encounter (Signed)
refill 

## 2013-04-03 NOTE — Telephone Encounter (Signed)
Received message from pt requesting refill of Protonix. States he previously received this from his cardiologist and has been unable to reach them re: further refills. Refill sent. Notified pt.

## 2013-04-03 NOTE — Telephone Encounter (Signed)
error 

## 2013-04-06 ENCOUNTER — Encounter: Payer: Self-pay | Admitting: Family Medicine

## 2013-04-06 DIAGNOSIS — H8110 Benign paroxysmal vertigo, unspecified ear: Secondary | ICD-10-CM

## 2013-04-06 HISTORY — DX: Benign paroxysmal vertigo, unspecified ear: H81.10

## 2013-04-06 NOTE — Progress Notes (Signed)
Patient ID: James [name], male   DOB: April 07, 1941, 72 y.o.   MRN: [id number] James [name] [id number] July 13, 1940 04/06/2013      Progress Note-Follow Up  Subjective  Chief Complaint  Chief Complaint  Patient presents with  . Injections    flu- high dose  . Dizziness    primarily X off and on, a little nausea and headache- has been taking antivert    HPI  Patient is a 72 year old male who is in today complaining of recurrence of dizziness. He has had a long history of intermittent episodes of vertigo but it has recently recurred. Denies any recent illness. Denies any headache, congestion, fevers. Denies palpitations, chest pain, shortness of breath. No GU complaints. Does struggle with some mild nausea but no vomiting. No constipation or diarrhea. Taking medications as prescribed  Past Medical History  Diagnosis Date  . CAD (coronary artery disease)     nonobstructive by cath 2008; nonobstructive by Cardiac CT 11/2011  . Hypothyroidism   . Atrial fibrillation   . Diverticulosis   . Mitral valve prolapse   . HLD (hyperlipidemia)   . GERD (gastroesophageal reflux disease)   . Hiatal hernia   . Cervical spine fracture 1980    C6-7  . 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 History of squamous cell carcinoma 10-05-10    Dr Cristopher Estimable, [place]  . 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  . Syncope 02/03/2013  . Benign paroxysmal positional vertigo 04/06/2013    Past Surgical History  Procedure Laterality Date  . Appendectomy  05/2011  . Inguinal hernia repair  07/2010    right  . Knee arthroplasty      bilat  . Cardiac catheterization  01/03/12    30% ostial LAD stenosis, ostial 30-40% D1 stenosis, smooth and normal left main and RCA; LVEF 65%  . Tonsillectomy  1947  . Tumor removal  1958    "fatty tumor"    Family History  Problem Relation Age of Onset  . Heart disease Mother     MI 11s  . Stroke Mother   . Hypertension Mother   . Heart disease Maternal  Grandfather     MI 70-80s  . Heart disease Maternal Grandmother     MI 70-80s  . Lung disease Father     penumonitis  . Cancer Sister     colon  . Cancer Paternal Uncle     GI: colon he thinks    History   Social History  . Marital Status: Single    Spouse Name: N/A    Number of Children: N/A  . Years of Education: N/A   Occupational History  . Art gallery manager     Retired   Social History Main Topics  . Smoking status: Never Smoker   . Smokeless tobacco: Not on file  . Alcohol Use: Yes     Comment: 2 glasses red wine/day  . Drug Use: No  . Sexual Activity: Yes   Other Topics Concern  . Not on file   Social History Narrative  . No narrative on file    Current Outpatient Prescriptions on File Prior to Visit  Medication Sig Dispense Refill  . aspirin 81 MG tablet Take 81 mg by mouth daily.      Randa Ngo 30 MG/ACT SOLN USE 1 PUMP UNDER EACH ARM EACH MORNING  90 mL  3  . Calcium Carb-Cholecalciferol (CALCIUM 1000 + D PO) Take 1 tablet by mouth daily.      Marland Kitchen  Ibuprofen-Diphenhydramine HCl (ADVIL PM) 200-25 MG CAPS Take 25 mg by mouth as needed.      Marland Kitchen JALYN 0.5-0.4 MG CAPS TAKE 1 CAPSULE BY MOUTH EVERY NIGHT AT BEDTIME  90 capsule  1  . levothyroxine (SYNTHROID, LEVOTHROID) 100 MCG tablet Take 100 mcg by mouth daily before breakfast. Takes medication 6 days a week      . mirtazapine (REMERON) 15 MG tablet TAKE 1 TABLET BY MOUTH EVERY NIGHT AT BEDTIME  90 tablet  1  . Multiple Vitamins-Minerals (MULTIVITAMINS THER. W/MINERALS) TABS Take 1 tablet by mouth daily.      . nitroGLYCERIN (NITROSTAT) 0.4 MG SL tablet Place 1 tablet (0.4 mg total) under the tongue every 5 (five) minutes x 3 doses as needed for chest pain.  25 tablet  3  . pravastatin (PRAVACHOL) 20 MG tablet TAKE 1 TABLET BY MOUTH EVERY NIGHT AT BEDTIME  90 tablet  1  . sildenafil (VIAGRA) 100 MG tablet Take 100 mg by mouth daily as needed for erectile dysfunction.      Marland Kitchen zolpidem (AMBIEN) 10 MG tablet Take 1 tablet (10  mg total) by mouth at bedtime as needed for sleep.  30 tablet  5   No current facility-administered medications on file prior to visit.    Allergies  Allergen Reactions  . Sulfa Antibiotics Other (See Comments)    Childhood allergy    Review of Systems  Review of Systems  Constitutional: Negative for fever and malaise/fatigue.  HENT: Negative for congestion.   Eyes: Negative for discharge.  Respiratory: Negative for shortness of breath.   Cardiovascular: Negative for chest pain, palpitations and leg swelling.  Gastrointestinal: Positive for nausea. Negative for vomiting, abdominal pain and diarrhea.  Genitourinary: Negative for dysuria.  Musculoskeletal: Negative for falls.  Skin: Negative for rash.  Neurological: Positive for dizziness. Negative for tingling, tremors, sensory change, speech change, focal weakness, seizures, loss of consciousness and headaches.  Endo/Heme/Allergies: Negative for polydipsia.  Psychiatric/Behavioral: Negative for depression and suicidal ideas. The patient is not nervous/anxious and does not have insomnia.     Objective  BP 122/88  Pulse 48  Temp(Src) 98 F (36.7 C) (Oral)  Ht 5\' 10"  (1.778 m)  Wt 154 lb (69.854 kg)  BMI 22.1 kg/m2  SpO2 97%  Physical Exam  Physical Exam  Constitutional: He is oriented to person, place, and time and well-developed, well-nourished, and in no distress. No distress.  HENT:  Head: Normocephalic and atraumatic.  Eyes: Conjunctivae are normal.  Neck: Neck supple. No thyromegaly present.  Cardiovascular: Normal rate, regular rhythm and normal heart sounds.   Pulmonary/Chest: Effort normal and breath sounds normal. No respiratory distress.  Abdominal: He exhibits no distension and no mass. There is no tenderness.  Musculoskeletal: He exhibits no edema.  Neurological: He is alert and oriented to person, place, and time.  Skin: Skin is warm.  Psychiatric: Memory, affect and judgment normal.    Lab Results   Component Value Date   TSH 0.247* 01/30/2013   Lab Results  Component Value Date   WBC 4.7 01/30/2013   HGB 14.1 01/30/2013   HCT 41.0 01/30/2013   MCV 90.9 01/30/2013   PLT 204 01/30/2013   Lab Results  Component Value Date   CREATININE 1.03 01/30/2013   BUN 15 01/30/2013   NA 135 01/30/2013   K 4.5 01/30/2013   CL 99 01/30/2013   CO2 31 01/30/2013   Lab Results  Component Value Date   ALT 16 01/30/2013  AST 21 01/30/2013   ALKPHOS 56 01/30/2013   BILITOT 0.7 01/30/2013   Lab Results  Component Value Date   CHOL 152 01/30/2013   Lab Results  Component Value Date   HDL 59 01/30/2013   Lab Results  Component Value Date   LDLCALC 72 01/30/2013   Lab Results  Component Value Date   TRIG 103 01/30/2013   Lab Results  Component Value Date   CHOLHDL 2.6 01/30/2013     Assessment & Plan  Benign paroxysmal positional vertigo Long history of intemittent episodes. Recently flared, encouraged adequate hydration and rest given refill on Meclizine to use prn and consider referral for further consideration if symptoms worsen.   Atrial fibrillation No recent palpitations or chest pain. Continue current meds

## 2013-04-06 NOTE — Assessment & Plan Note (Signed)
Long history of intemittent episodes. Recently flared, encouraged adequate hydration and rest given refill on Meclizine to use prn and consider referral for further consideration if symptoms worsen.

## 2013-04-06 NOTE — Assessment & Plan Note (Signed)
No recent palpitations or chest pain. Continue current meds

## 2013-04-08 ENCOUNTER — Telehealth: Payer: Self-pay | Admitting: *Deleted

## 2013-04-08 NOTE — Telephone Encounter (Signed)
[employer] Employee approved for protonix from 8/15-10/15/2015

## 2013-04-23 ENCOUNTER — Other Ambulatory Visit: Payer: Self-pay | Admitting: Family Medicine

## 2013-04-23 NOTE — Telephone Encounter (Signed)
Refill done.  

## 2013-05-27 ENCOUNTER — Telehealth: Payer: Self-pay | Admitting: Family Medicine

## 2013-05-27 MED ORDER — MIRTAZAPINE 15 MG PO TABS: 15.0000 mg | ORAL_TABLET | Freq: Every day | ORAL | Status: DC

## 2013-05-27 NOTE — Telephone Encounter (Signed)
Refill mirtazapine 15mg  tab 90 take 1 tablet by mouth every night at bedtime last fill 03-11-2013

## 2013-05-27 NOTE — Telephone Encounter (Signed)
Please advise refill?  Last RX was done on 11-28-12 quantity 90 with 1 refill

## 2013-06-19 ENCOUNTER — Other Ambulatory Visit: Payer: Self-pay | Admitting: Family Medicine

## 2013-06-28 ENCOUNTER — Telehealth: Payer: Self-pay | Admitting: Family Medicine

## 2013-06-28 NOTE — Telephone Encounter (Signed)
PA form faxed over from [facility] for [place], forward paperwork to nurse

## 2013-07-05 NOTE — Telephone Encounter (Signed)
Have you heard anything back on this PA? 

## 2013-07-05 NOTE — Telephone Encounter (Signed)
No I have not, I have not seen it since I sent it back. °

## 2013-07-08 ENCOUNTER — Ambulatory Visit (INDEPENDENT_AMBULATORY_CARE_PROVIDER_SITE_OTHER): Payer: Medicare Other | Admitting: Family

## 2013-07-08 ENCOUNTER — Encounter: Payer: Self-pay | Admitting: Family

## 2013-07-08 VITALS — BP 110/84 | HR 51 | Temp 97.8°F | Resp 18 | Ht 70.0 in | Wt 154.1 lb

## 2013-07-08 DIAGNOSIS — H6691 Otitis media, unspecified, right ear: Secondary | ICD-10-CM | POA: Insufficient documentation

## 2013-07-08 DIAGNOSIS — H6690 Otitis media, unspecified, unspecified ear: Secondary | ICD-10-CM | POA: Diagnosis not present

## 2013-07-08 MED ORDER — CEFDINIR 300 MG PO CAPS: 300.0000 mg | ORAL_CAPSULE | Freq: Two times a day (BID) | ORAL | Status: DC

## 2013-07-08 MED ORDER — TESTOSTERONE 30 MG/ACT TD SOLN
TRANSDERMAL | Status: DC
Start: 2013-07-08 — End: 2013-10-15

## 2013-07-08 NOTE — Assessment & Plan Note (Signed)
Will rx with cefdinir.  I suspect that this is the cause for his dizziness. I have instructed the pt to call us if symptoms worsen, or if symptoms are not improved in 2-3 days.

## 2013-07-08 NOTE — Progress Notes (Signed)
Pre visit review using our clinic review tool, if applicable. No additional management support is needed unless otherwise documented below in the visit note. 

## 2013-07-08 NOTE — Patient Instructions (Signed)
Star omnicef (cefdinir) for a right ear infection. Follow up if symptoms worsen, or if not resolved in 7 days.

## 2013-07-08 NOTE — Progress Notes (Signed)
Subjective:    Patient ID: James [name], male    DOB: 20-Aug-1940, 73 y.o.   MRN: [id number]  HPI  Mr. Raether is a 73 yr old male who presents today with chief complaint of dizziness. Was seen back in October with same symptoms and given diagnosis of BPV.  He was given refill of meclizine at that time. [facility]  He was treated on 1/14 with augmentin for sinusitis.  He reports that the meclizine helped back in October.  Not helping much now. Took last of 5 day course of abx today.  Reports that no symptoms at night when lying down. Symptoms "come and go during the day."  He denies associated nausea, numbness or unilateral weakness.  He denies blurred vision.    Review of Systems See HPI  Past Medical History  Diagnosis Date  . CAD (coronary artery disease)     nonobstructive by cath 2008; nonobstructive by Cardiac CT 11/2011  . Hypothyroidism   . Atrial fibrillation   . Diverticulosis   . Mitral valve prolapse   . HLD (hyperlipidemia)   . GERD (gastroesophageal reflux disease)   . Hiatal hernia   . Cervical spine fracture 1980    C6-7  . 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 History of squamous cell carcinoma 10-05-10    Dr [name], [place]  . 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  . Syncope 02/03/2013  . Benign paroxysmal positional vertigo 04/06/2013    History   Social History  . Marital Status: Single    Spouse Name: N/A    Number of Children: N/A  . Years of Education: N/A   Occupational History  . Chief Financial Officer     Retired   Social History Main Topics  . Smoking status: Never Smoker   . Smokeless tobacco: Not on file  . Alcohol Use: Yes     Comment: 2 glasses red wine/day  . Drug Use: No  . Sexual Activity: Yes   Other Topics Concern  . Not on file   Social History Narrative  . No narrative on file    Past Surgical History  Procedure Laterality Date  . Appendectomy  05/2011  . Inguinal hernia  repair  07/2010    right  . Knee arthroplasty      bilat  . Cardiac catheterization  01/03/12    30% ostial LAD stenosis, ostial 30-40% D1 stenosis, smooth and normal left main and RCA; LVEF 65%  . Tonsillectomy  1947  . Tumor removal  1958    "fatty tumor"    Family History  Problem Relation Age of Onset  . Heart disease Mother     MI 80s  . Stroke Mother   . Hypertension Mother   . Heart disease Maternal Grandfather     MI 70-80s  . Heart disease Maternal Grandmother     MI 70-80s  . Lung disease Father     penumonitis  . Cancer Sister     colon  . Cancer Paternal Uncle     GI: colon he thinks    Allergies  Allergen Reactions  . Sulfa Antibiotics Other (See Comments)    Childhood allergy    Current Outpatient Prescriptions on File Prior to Visit  Medication Sig Dispense Refill  . aspirin 81 MG tablet Take 81 mg by mouth daily.      Hinda Kehr 30 MG/ACT SOLN USE 1 PUMP UNDER [facility]  ARM EACH MORNING  90 mL  3  . Calcium Carb-Cholecalciferol (CALCIUM 1000 + D PO) Take 1 tablet by mouth daily.      . Ibuprofen-Diphenhydramine HCl (ADVIL PM) 200-25 MG CAPS Take 25 mg by mouth as needed.      Marland Kitchen JALYN 0.5-0.4 MG CAPS TAKE 1 CAPSULE BY MOUTH EVERY NIGHT AT BEDTIME  90 capsule  1  . levothyroxine (SYNTHROID, LEVOTHROID) 100 MCG tablet Take 100 mcg by mouth daily before breakfast. Takes medication 6 days a week      . levothyroxine (SYNTHROID, LEVOTHROID) 100 MCG tablet Take 1 tablet (100 mcg total) by mouth daily before breakfast.  30 tablet  2  . meclizine (ANTIVERT) 25 MG tablet Take 1 tablet (25 mg total) by mouth 2 (two) times daily as needed for dizziness or nausea.  30 tablet  2  . MELATONIN PO Take by mouth at bedtime as needed.      . milk thistle 175 MG tablet Take 175 mg by mouth daily.      . mirtazapine (REMERON) 15 MG tablet Take 1 tablet (15 mg total) by mouth at bedtime.  90 tablet  0  . Multiple Vitamins-Minerals (MULTIVITAMINS THER. W/MINERALS) TABS Take 1 tablet  by mouth daily.      Marland Kitchen neomycin-polymyxin-hydrocortisone (CORTISPORIN) otic solution Place 3 drops into the left ear 3 (three) times daily.  10 mL  1  . nitroGLYCERIN (NITROSTAT) 0.4 MG SL tablet Place 1 tablet (0.4 mg total) under the tongue every 5 (five) minutes x 3 doses as needed for chest pain.  25 tablet  3  . pantoprazole (PROTONIX) 40 MG tablet Take 1 tablet (40 mg total) by mouth 2 (two) times daily.  60 tablet  4  . pravastatin (PRAVACHOL) 20 MG tablet TAKE 1 TABLET BY MOUTH EVERY NIGHT AT BEDTIME  90 tablet  1  . sildenafil (VIAGRA) 100 MG tablet Take 100 mg by mouth daily as needed for erectile dysfunction.      Marland Kitchen zolpidem (AMBIEN) 10 MG tablet Take 1 tablet (10 mg total) by mouth at bedtime as needed for sleep.  30 tablet  5   No current facility-administered medications on file prior to visit.    BP 110/84  Pulse 51  Temp(Src) 97.8 F (36.6 C) (Oral)  Resp 18  Ht 5\' 10"  (1.778 m)  Wt 154 lb 1.3 oz (69.89 kg)  BMI 22.11 kg/m2  SpO2 99%       Objective:   Physical Exam  Constitutional: He is oriented to person, place, and time. He appears well-developed and well-nourished. No distress.  HENT:  Head: Normocephalic and atraumatic.  Left Ear: Tympanic membrane and ear canal normal.  R TM dull, yellow fluid behind the right TM. TM is dull with some streaking noted of the right TM  Eyes: No scleral icterus.  Cardiovascular: Normal rate and regular rhythm.   No murmur heard. Pulmonary/Chest: Effort normal and breath sounds normal. No respiratory distress. He has no wheezes. He has no rales. He exhibits no tenderness.  Musculoskeletal: He exhibits no edema.  Neurological: He is alert and oriented to person, place, and time.  Psychiatric: He has a normal mood and affect. His behavior is normal. Judgment and thought content normal.          Assessment & Plan:

## 2013-07-11 DIAGNOSIS — L259 Unspecified contact dermatitis, unspecified cause: Secondary | ICD-10-CM | POA: Diagnosis not present

## 2013-07-11 DIAGNOSIS — D485 Neoplasm of uncertain behavior of skin: Secondary | ICD-10-CM | POA: Diagnosis not present

## 2013-07-11 DIAGNOSIS — L570 Actinic keratosis: Secondary | ICD-10-CM | POA: Diagnosis not present

## 2013-07-11 DIAGNOSIS — L821 Other seborrheic keratosis: Secondary | ICD-10-CM | POA: Diagnosis not present

## 2013-07-11 NOTE — Telephone Encounter (Signed)
Axiron has been approved from 06-20-13 through 07-08-14

## 2013-07-16 ENCOUNTER — Telehealth: Payer: Self-pay | Admitting: Family Medicine

## 2013-07-16 DIAGNOSIS — G4700 Insomnia, unspecified: Secondary | ICD-10-CM

## 2013-07-16 NOTE — Telephone Encounter (Signed)
Refill-zolpidem last fill 5.19.14

## 2013-07-16 NOTE — Telephone Encounter (Signed)
Refill request for zolpidem  Last filled by MD on - 01/30/13 #30 x5 Last Appt: 07/08/13 Next Appt: 08/06/13 Please advise refill?

## 2013-07-17 NOTE — Telephone Encounter (Signed)
OK to refill Zolpidem same strength, same sig, #30 with 5 rf

## 2013-07-18 MED ORDER — ZOLPIDEM TARTRATE 10 MG PO TABS: 10.0000 mg | ORAL_TABLET | Freq: Every evening | ORAL | Status: DC | PRN

## 2013-07-18 NOTE — Telephone Encounter (Signed)
RX faxed to pharmacy.

## 2013-07-29 ENCOUNTER — Telehealth: Payer: Self-pay | Admitting: Family Medicine

## 2013-07-29 MED ORDER — DUTASTERIDE-TAMSULOSIN HCL 0.5-0.4 MG PO CAPS: 1.0000 | ORAL_CAPSULE | Freq: Every day | ORAL | Status: DC

## 2013-07-29 MED ORDER — MIRTAZAPINE 15 MG PO TABS: 15.0000 mg | ORAL_TABLET | Freq: Every day | ORAL | Status: DC

## 2013-07-29 NOTE — Telephone Encounter (Signed)
Request refill on Jalyn 0.5-0.4mg  cap #90 Take 1 capsule by mouth every night at bedtime And refill on Mirtazapine 15mg  tab #90, take 1 tablet by mouth every night at bedtime

## 2013-07-30 IMAGING — CR DG CHEST 2V
2 series · 2 of 2 positions shown · non-contrast
Comparison: None.

CLINICAL DATA: Intermittent chest pain for several weeks, history
of atrial fibrillation.

CHEST - 2 VIEW

[w chest pa]
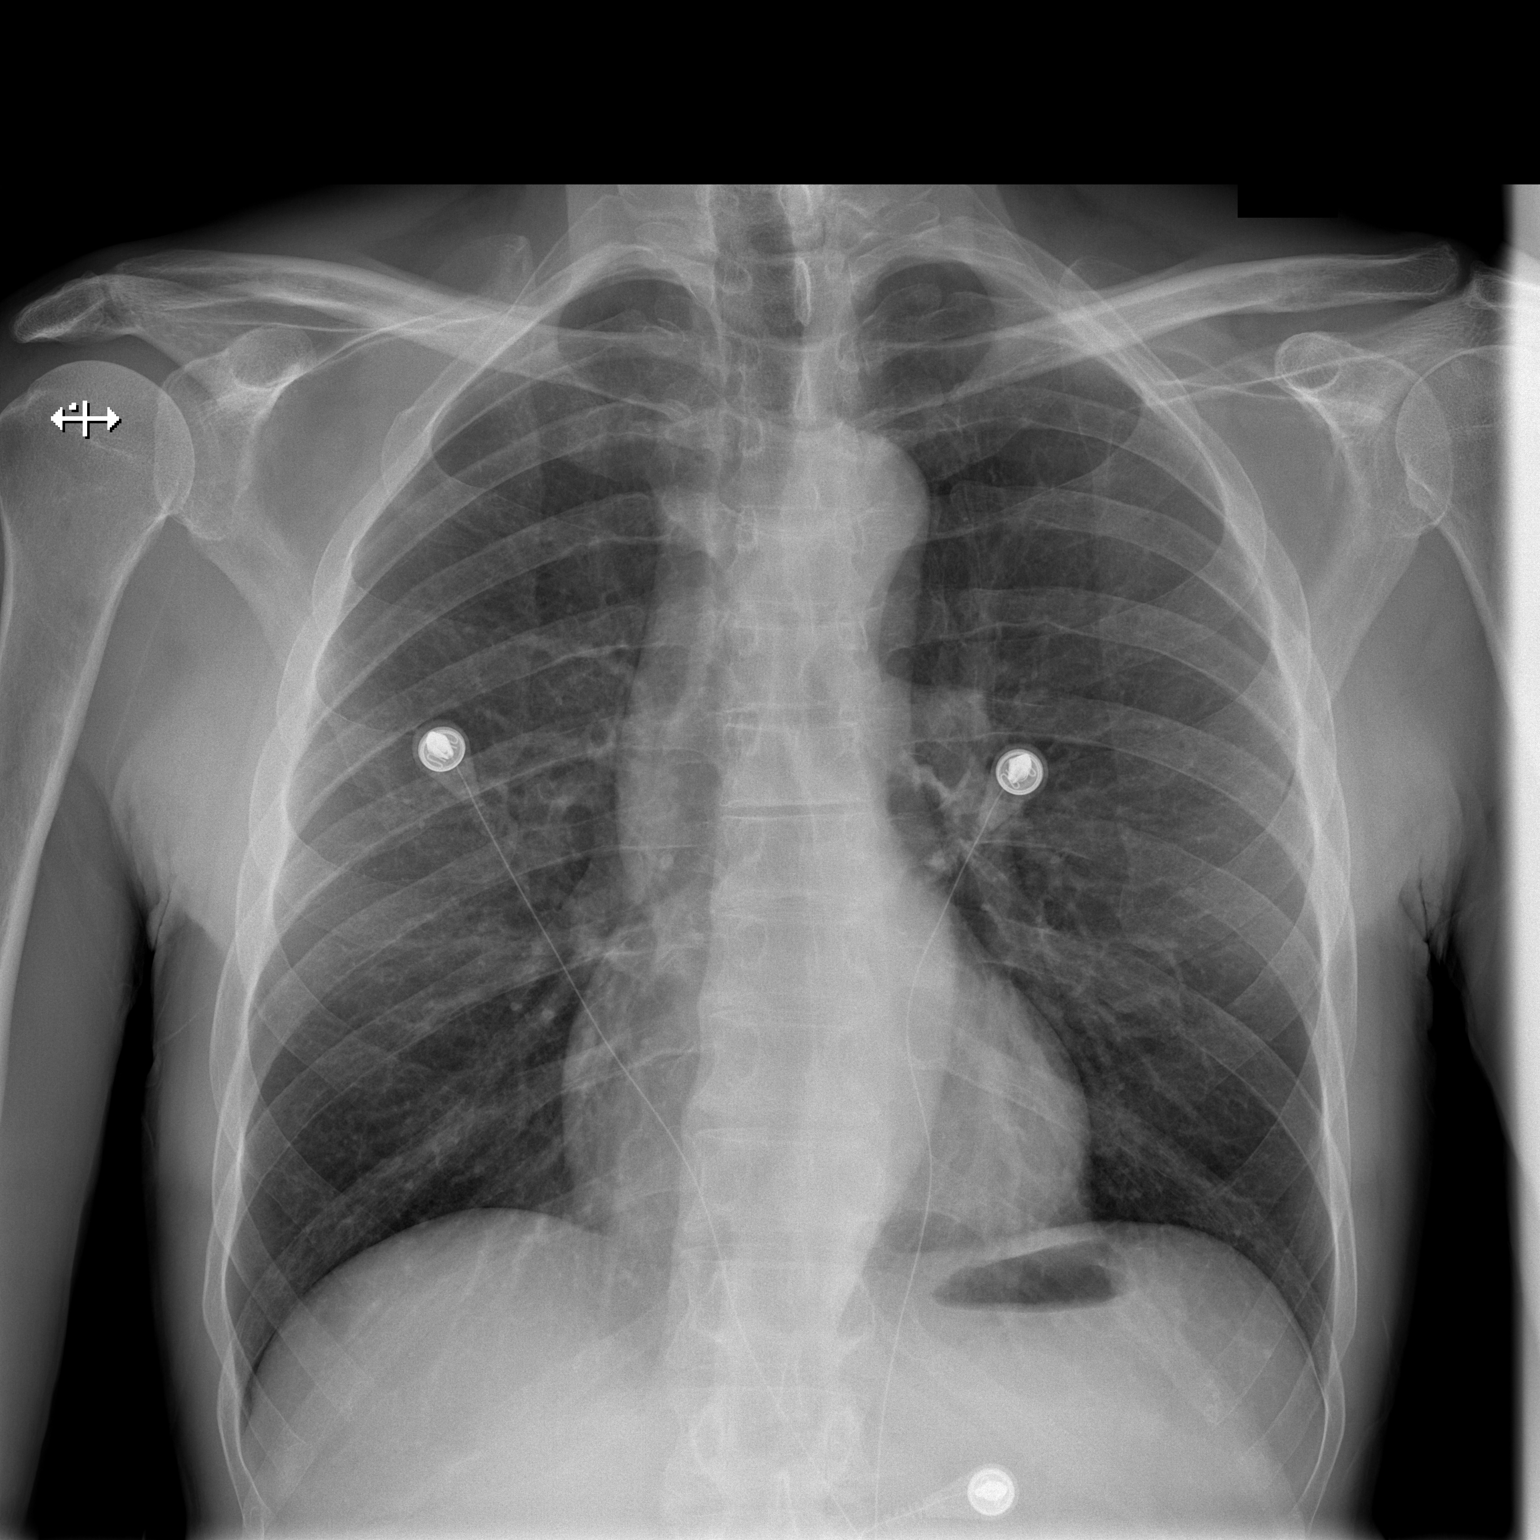

[w chest lat]
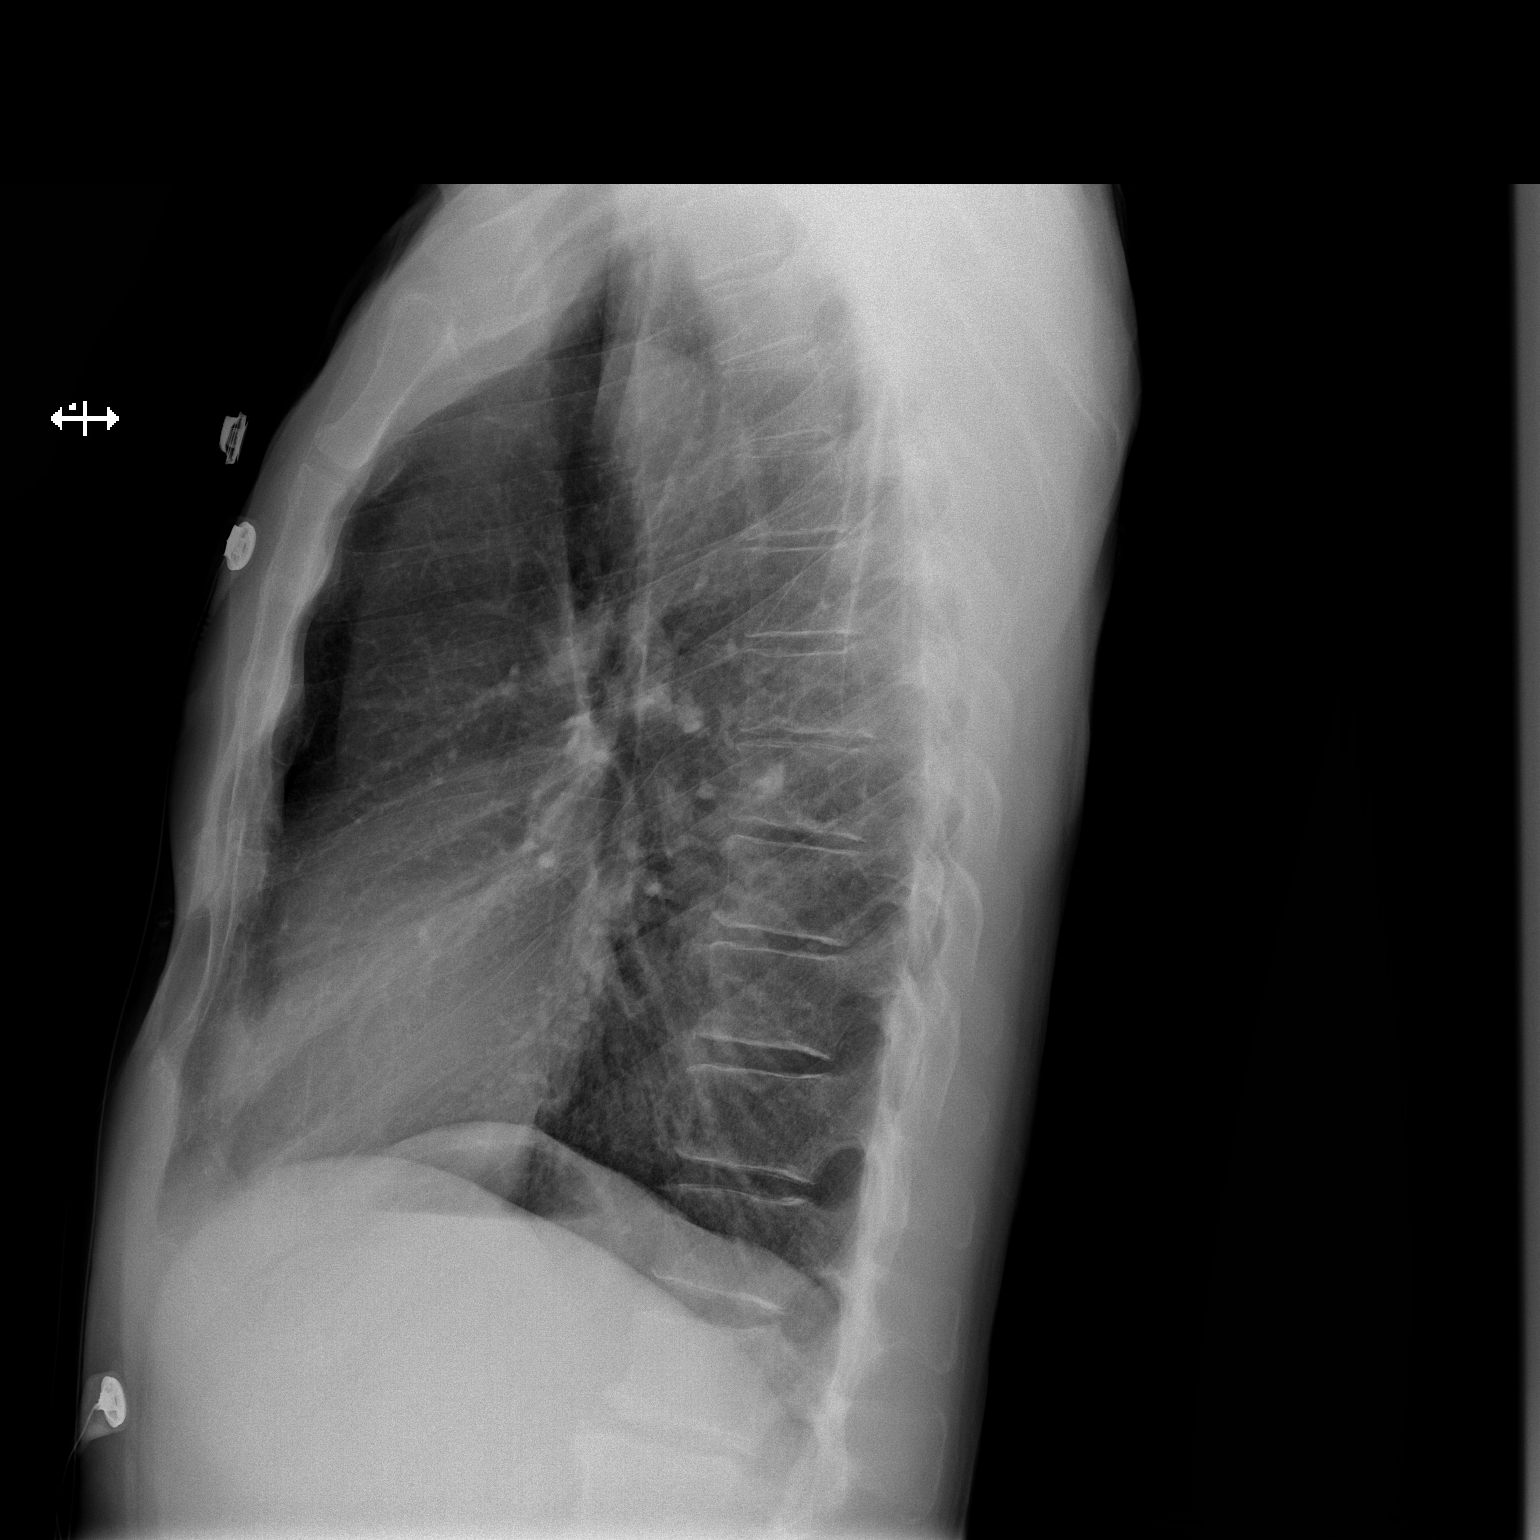

[2 of 2 positions shown; findings below may reference images not displayed]

FINDINGS: The lungs are clear.  Mediastinal contours appear normal.
The heart is within normal limits in size.  No bony abnormality is
seen.
IMPRESSION: No active lung disease.

## 2013-08-05 ENCOUNTER — Telehealth: Payer: Self-pay | Admitting: Family Medicine

## 2013-08-05 MED ORDER — PRAVASTATIN SODIUM 20 MG PO TABS: 20.0000 mg | ORAL_TABLET | Freq: Every day | ORAL | Status: DC

## 2013-08-05 NOTE — Telephone Encounter (Signed)
Refill- pravastatin °

## 2013-08-06 ENCOUNTER — Ambulatory Visit: Payer: Medicare Other | Admitting: Family Medicine

## 2013-09-03 ENCOUNTER — Encounter: Payer: Self-pay | Admitting: Family Medicine

## 2013-09-03 ENCOUNTER — Ambulatory Visit (INDEPENDENT_AMBULATORY_CARE_PROVIDER_SITE_OTHER): Payer: Medicare Other | Admitting: Family Medicine

## 2013-09-03 VITALS — BP 104/82 | HR 49 | Temp 98.2°F | Ht 70.0 in | Wt 153.1 lb

## 2013-09-03 DIAGNOSIS — I2510 Atherosclerotic heart disease of native coronary artery without angina pectoris: Secondary | ICD-10-CM | POA: Diagnosis not present

## 2013-09-03 DIAGNOSIS — E785 Hyperlipidemia, unspecified: Secondary | ICD-10-CM | POA: Diagnosis not present

## 2013-09-03 DIAGNOSIS — E039 Hypothyroidism, unspecified: Secondary | ICD-10-CM | POA: Diagnosis not present

## 2013-09-03 DIAGNOSIS — N529 Male erectile dysfunction, unspecified: Secondary | ICD-10-CM

## 2013-09-03 DIAGNOSIS — I25810 Atherosclerosis of coronary artery bypass graft(s) without angina pectoris: Secondary | ICD-10-CM

## 2013-09-03 DIAGNOSIS — R7989 Other specified abnormal findings of blood chemistry: Secondary | ICD-10-CM

## 2013-09-03 DIAGNOSIS — E349 Endocrine disorder, unspecified: Secondary | ICD-10-CM

## 2013-09-03 DIAGNOSIS — E291 Testicular hypofunction: Secondary | ICD-10-CM | POA: Diagnosis not present

## 2013-09-03 DIAGNOSIS — I714 Abdominal aortic aneurysm, without rupture, unspecified: Secondary | ICD-10-CM

## 2013-09-03 LAB — HEPATIC FUNCTION PANEL
ALT: 16 U/L (ref 0–53)
AST: 24 U/L (ref 0–37)
Albumin: 4.2 g/dL (ref 3.5–5.2)
Alkaline Phosphatase: 55 U/L (ref 39–117)
BILIRUBIN INDIRECT: 0.5 mg/dL (ref 0.2–1.2)
Bilirubin, Direct: 0.1 mg/dL (ref 0.0–0.3)
TOTAL PROTEIN: 7 g/dL (ref 6.0–8.3)
Total Bilirubin: 0.6 mg/dL (ref 0.2–1.2)

## 2013-09-03 LAB — RENAL FUNCTION PANEL
Albumin: 4.2 g/dL (ref 3.5–5.2)
BUN: 16 mg/dL (ref 6–23)
CALCIUM: 9.4 mg/dL (ref 8.4–10.5)
CO2: 29 mEq/L (ref 19–32)
Chloride: 100 mEq/L (ref 96–112)
Creat: 1.02 mg/dL (ref 0.50–1.35)
Glucose, Bld: 97 mg/dL (ref 70–99)
PHOSPHORUS: 3.6 mg/dL (ref 2.3–4.6)
Potassium: 4.3 mEq/L (ref 3.5–5.3)
SODIUM: 139 meq/L (ref 135–145)

## 2013-09-03 LAB — CBC
HEMATOCRIT: 40.6 % (ref 39.0–52.0)
Hemoglobin: 14.3 g/dL (ref 13.0–17.0)
MCH: 31.6 pg (ref 26.0–34.0)
MCHC: 35.2 g/dL (ref 30.0–36.0)
MCV: 89.6 fL (ref 78.0–100.0)
Platelets: 239 10*3/uL (ref 150–400)
RBC: 4.53 MIL/uL (ref 4.22–5.81)
RDW: 13.5 % (ref 11.5–15.5)
WBC: 6.8 10*3/uL (ref 4.0–10.5)

## 2013-09-03 LAB — LIPID PANEL
CHOLESTEROL: 161 mg/dL (ref 0–200)
HDL: 63 mg/dL (ref >39)
LDL Cholesterol: 85 mg/dL (ref 0–99)
Total CHOL/HDL Ratio: 2.6 Ratio
Triglycerides: 65 mg/dL (ref <150)
VLDL: 13 mg/dL (ref 0–40)

## 2013-09-03 MED ORDER — SILDENAFIL CITRATE 100 MG PO TABS: 100.0000 mg | ORAL_TABLET | Freq: Every day | ORAL | Status: DC | PRN

## 2013-09-03 NOTE — Patient Instructions (Signed)
[employer]ive [employer]age probiotics daily  Cholesterol Cholesterol is a white, waxy, fat-like protein needed by your body in small amounts. The liver makes all the cholesterol you need. It is carried from the liver by the blood through the blood vessels. Deposits (plaque) may build up on blood vessel walls. This makes the arteries narrower and stiffer. Plaque increases the risk for heart attack and stroke. You cannot feel your cholesterol level even if it is very high. The only way to know is by a blood test to check your lipid (fats) levels. Once you know your cholesterol levels, you should keep a record of the test results. Work with your caregiver to to keep your levels in the desired range. WHAT THE RESULTS MEAN:  Total cholesterol is a rough measure of all the cholesterol in your blood.  LDL is the so-called bad cholesterol. This is the type that deposits cholesterol in the walls of the arteries. You want this level to be low.  HDL is the good cholesterol because it cleans the arteries and carries the LDL away. You want this level to be high.  Triglycerides are fat that the body can either burn for energy or store. High levels are closely linked to heart disease. DESIRED LEVELS:  Total cholesterol below 200.  LDL below 100 for people at risk, below 70 for very high risk.  HDL above 50 is good, above 60 is best.  Triglycerides below 150. HOW TO LOWER YOUR CHOLESTEROL:  Diet.  Choose fish or white meat chicken and [place], roasted or baked. Limit fatty cuts of red meat, fried foods, and processed meats, such as sausage and lunch meat.  Eat lots of fresh fruits and vegetables. Choose whole grains, beans, pasta, potatoes and cereals.  Use only small amounts of olive, corn or canola oils. Avoid butter, mayonnaise, shortening or palm kernel oils. Avoid foods with trans-fats.  Use skim/nonfat milk and low-fat/nonfat yogurt and cheeses. Avoid whole milk, cream, ice cream, egg yolks and  cheeses. Healthy desserts include angel food cake, ginger snaps, animal crackers, hard candy, popsicles, and low-fat/nonfat frozen yogurt. Avoid pastries, cakes, pies and cookies.  Exercise.  A regular program helps decrease LDL and raises HDL.  Helps with weight control.  Do things that increase your activity level like gardening, walking, or taking the stairs.  Medication.  May be prescribed by your caregiver to help lowering cholesterol and the risk for heart disease.  You may need medicine even if your levels are normal if you have several risk factors. HOME CARE INSTRUCTIONS   Follow your diet and exercise programs as suggested by your caregiver.  Take medications as directed.  Have blood work done when your caregiver feels it is necessary. MAKE SURE YOU:   Understand these instructions.  Will watch your condition.  Will get help right away if you are not doing well or get worse. Document Released: 03/01/2001 Document Revised: 08/29/2011 Document Reviewed: 03/20/2013 [facility] Patient Information 2014 [place], [place].

## 2013-09-03 NOTE — Progress Notes (Signed)
Pre visit review using our clinic review tool, if applicable. No additional management support is needed unless otherwise documented below in the visit note. 

## 2013-09-03 NOTE — Progress Notes (Signed)
Patient ID: James [name], male   DOB: [birth year], 73 y.o.   MRN: [id number] James [name] [id number] [birth year] 09/03/2013      Progress Note-Follow Up  Subjective  Chief Complaint  Chief Complaint  Patient presents with  . Hyperlipidemia    pt is here for f/u. pt stated that he had a lump on his left lower arm for a while without any pain. He wants r/f on Viagra.  . Hypothyroidism    pt is here for f/u    HPI  Patient is a 73 year old male in today for routine medical care. Notes a recent sinus infection in January that required 2 rounds of antibiotics but is doing much better now. Was having ear trboule with this but it has improved. No acute concerns. Denies CP/palp/SOB/HA/congestion/fevers/GI or GU c/o. Taking meds as prescribed  Past Medical History  Diagnosis Date  . CAD (coronary artery disease)     nonobstructive by cath 2008; nonobstructive by Cardiac CT 11/2011  . Hypothyroidism   . Atrial fibrillation   . Diverticulosis   . Mitral valve prolapse   . HLD (hyperlipidemia)   . GERD (gastroesophageal reflux disease)   . Hiatal hernia   . Cervical spine fracture 1980    C6-7  . 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 History of squamous cell carcinoma 10-05-10    Dr [name], [place]  . 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  . Syncope 02/03/2013  . Benign paroxysmal positional vertigo 04/06/2013    Past Surgical History  Procedure Laterality Date  . Appendectomy  05/2011  . Inguinal hernia repair  07/2010    right  . Knee arthroplasty      bilat  . Cardiac catheterization  01/03/12    30% ostial LAD stenosis, ostial 30-40% D1 stenosis, smooth and normal left main and RCA; LVEF 65%  . Tonsillectomy  1947  . Tumor removal  1958    "fatty tumor"    Family History  Problem Relation Age of Onset  . Heart disease Mother     MI 85s  . Stroke Mother   . Hypertension Mother   . Heart disease Maternal Grandfather     MI 70-80s  . Heart disease  Maternal Grandmother     MI 70-80s  . Lung disease Father     penumonitis  . Cancer Sister     colon  . Cancer Paternal Uncle     GI: colon he thinks    History   Social History  . Marital Status: Single    Spouse Name: N/A    Number of Children: N/A  . Years of Education: N/A   Occupational History  . Chief Financial Officer     Retired   Social History Main Topics  . Smoking status: Never Smoker   . Smokeless tobacco: Not on file  . Alcohol Use: Yes     Comment: 2 glasses red wine/day  . Drug Use: No  . Sexual Activity: Yes   Other Topics Concern  . Not on file   Social History Narrative  . No narrative on file    Current Outpatient Prescriptions on File Prior to Visit  Medication Sig Dispense Refill  . aspirin 81 MG tablet Take 81 mg by mouth daily.      . Calcium Carb-Cholecalciferol (CALCIUM 1000 + D PO) Take 1 tablet by mouth daily.      . cefdinir (OMNICEF) 300 MG capsule Take 1 capsule (300  mg total) by mouth 2 (two) times daily.  14 capsule  0  . Dutasteride-Tamsulosin HCl (JALYN) 0.5-0.4 MG CAPS Take 1 capsule by mouth at bedtime.  90 capsule  0  . Ibuprofen-Diphenhydramine HCl (ADVIL PM) 200-25 MG CAPS Take 25 mg by mouth as needed.      Marland Kitchen levothyroxine (SYNTHROID, LEVOTHROID) 100 MCG tablet Take 100 mcg by mouth daily before breakfast. Takes medication 6 days a week      . levothyroxine (SYNTHROID, LEVOTHROID) 100 MCG tablet Take 1 tablet (100 mcg total) by mouth daily before breakfast.  30 tablet  2  . meclizine (ANTIVERT) 25 MG tablet Take 1 tablet (25 mg total) by mouth 2 (two) times daily as needed for dizziness or nausea.  30 tablet  2  . MELATONIN PO Take by mouth at bedtime as needed.      . milk thistle 175 MG tablet Take 175 mg by mouth daily.      . mirtazapine (REMERON) 15 MG tablet Take 1 tablet (15 mg total) by mouth at bedtime.  90 tablet  0  . Multiple Vitamins-Minerals (MULTIVITAMINS THER. W/MINERALS) TABS Take 1 tablet by mouth daily.      Marland Kitchen  neomycin-polymyxin-hydrocortisone (CORTISPORIN) otic solution Place 3 drops into the left ear 3 (three) times daily.  10 mL  1  . pantoprazole (PROTONIX) 40 MG tablet Take 1 tablet (40 mg total) by mouth 2 (two) times daily.  60 tablet  4  . pravastatin (PRAVACHOL) 20 MG tablet Take 1 tablet (20 mg total) by mouth daily.  90 tablet  0  . sildenafil (VIAGRA) 100 MG tablet Take 100 mg by mouth daily as needed for erectile dysfunction.      . Testosterone (AXIRON) 30 MG/ACT SOLN USE 1 PUMP UNDER EACH ARM EACH MORNING  90 mL  0  . zolpidem (AMBIEN) 10 MG tablet Take 1 tablet (10 mg total) by mouth at bedtime as needed for sleep.  30 tablet  5  . nitroGLYCERIN (NITROSTAT) 0.4 MG SL tablet Place 1 tablet (0.4 mg total) under the tongue every 5 (five) minutes x 3 doses as needed for chest pain.  25 tablet  3   No current facility-administered medications on file prior to visit.    Allergies  Allergen Reactions  . Sulfa Antibiotics Other (See Comments)    Childhood allergy    Review of Systems  Review of Systems  Constitutional: Negative for fever and malaise/fatigue.  HENT: Negative for congestion.   Eyes: Negative for discharge.  Respiratory: Negative for shortness of breath.   Cardiovascular: Negative for chest pain, palpitations and leg swelling.  Gastrointestinal: Negative for nausea, abdominal pain and diarrhea.  Genitourinary: Negative for dysuria.  Musculoskeletal: Negative for falls.  Skin: Negative for rash.  Neurological: Negative for loss of consciousness and headaches.  Endo/Heme/Allergies: Negative for polydipsia.  Psychiatric/Behavioral: Negative for depression and suicidal ideas. The patient is not nervous/anxious and does not have insomnia.     Objective  BP 104/82  Pulse 49  Temp(Src) 98.2 F (36.8 C) (Oral)  Ht 5\' 10"  (1.778 m)  Wt 153 lb 1.9 oz (69.455 kg)  BMI 21.97 kg/m2  SpO2 99%  Physical Exam  Physical Exam  Constitutional: He is oriented to person,  place, and time and well-developed, well-nourished, and in no distress. No distress.  HENT:  Head: Normocephalic and atraumatic.  Eyes: Conjunctivae are normal.  Neck: Neck supple. No thyromegaly present.  Cardiovascular: Normal rate, regular rhythm and normal heart  sounds.   No murmur heard. Pulmonary/Chest: Effort normal and breath sounds normal. No respiratory distress.  Abdominal: He exhibits no distension and no mass. There is no tenderness.  Musculoskeletal: He exhibits no edema.  Neurological: He is alert and oriented to person, place, and time.  Skin: Skin is warm.  Psychiatric: Memory, affect and judgment normal.    Lab Results  Component Value Date   TSH 0.247* 01/30/2013   Lab Results  Component Value Date   WBC 4.7 01/30/2013   HGB 14.1 01/30/2013   HCT 41.0 01/30/2013   MCV 90.9 01/30/2013   PLT 204 01/30/2013   Lab Results  Component Value Date   CREATININE 1.03 01/30/2013   BUN 15 01/30/2013   NA 135 01/30/2013   K 4.5 01/30/2013   CL 99 01/30/2013   CO2 31 01/30/2013   Lab Results  Component Value Date   ALT 16 01/30/2013   AST 21 01/30/2013   ALKPHOS 56 01/30/2013   BILITOT 0.7 01/30/2013   Lab Results  Component Value Date   CHOL 152 01/30/2013   Lab Results  Component Value Date   HDL 59 01/30/2013   Lab Results  Component Value Date   LDLCALC 72 01/30/2013   Lab Results  Component Value Date   TRIG 103 01/30/2013   Lab Results  Component Value Date   CHOLHDL 2.6 01/30/2013     Assessment & Plan  Hypothyroidism On Levothyroxine, continue to monitor. TSH wnl limits with labs at visit  Hyperlipidemia Tolerating statin, encouraged heart healthy diet, avoid trans fats, minimize simple carbs and saturated fats. Increase exercise as tolerated  AAA (abdominal aortic aneurysm) without rupture Needs surveillance with cardiology will encourage patient to proceed with CTA as recommended by cardiology and to schedule next appt with cardiology.   CAD  (coronary artery disease) Asymptomatic and doing well at this time.  Testosterone deficiency Testosterone level in mid normal range on check at visit, no changes  Erectile dysfunction Has tolerated Viagra, requests refill, patient aware of risk. Given refill today

## 2013-09-04 LAB — TSH: TSH: 1.231 u[IU]/mL (ref 0.350–4.500)

## 2013-09-04 LAB — TESTOSTERONE: Testosterone: 531 ng/dL (ref 300–890)

## 2013-09-08 ENCOUNTER — Encounter: Payer: Self-pay | Admitting: Family Medicine

## 2013-09-08 DIAGNOSIS — I714 Abdominal aortic aneurysm, without rupture, unspecified: Secondary | ICD-10-CM

## 2013-09-08 DIAGNOSIS — N529 Male erectile dysfunction, unspecified: Secondary | ICD-10-CM

## 2013-09-08 HISTORY — DX: Abdominal aortic aneurysm, without rupture: I71.4

## 2013-09-08 HISTORY — DX: Male erectile dysfunction, unspecified: N52.9

## 2013-09-08 HISTORY — DX: Abdominal aortic aneurysm, without rupture, unspecified: I71.40

## 2013-09-08 NOTE — Assessment & Plan Note (Signed)
Tolerating statin, encouraged heart healthy diet, avoid trans fats, minimize simple carbs and saturated fats. Increase exercise as tolerated 

## 2013-09-08 NOTE — Assessment & Plan Note (Signed)
Testosterone level in mid normal range on check at visit, no changes

## 2013-09-08 NOTE — Assessment & Plan Note (Signed)
Asymptomatic and doing well at this time.

## 2013-09-08 NOTE — Assessment & Plan Note (Signed)
On Levothyroxine, continue to monitor. TSH wnl limits with labs at visit

## 2013-09-08 NOTE — Assessment & Plan Note (Signed)
Needs surveillance with cardiology will encourage patient to proceed with CTA as recommended by cardiology and to schedule next appt with cardiology.

## 2013-09-08 NOTE — Assessment & Plan Note (Signed)
Has tolerated Viagra, requests refill, patient aware of risk. Given refill today

## 2013-09-09 ENCOUNTER — Telehealth: Payer: Self-pay

## 2013-09-09 NOTE — Telephone Encounter (Signed)
Message copied by Varney Daily on Mon Sep 09, 2013  1:14 PM ------      Message from: Penni Homans A      Created: Sun Sep 08, 2013 10:45 AM       Please let him know I reviewed his cardiology note and they wanted to see him back in 6 months when he saw then in 8/14 and they want to do some imaging on his abdominal aortic aneurysm this year so he should call for an appt unless he already has one or I can set one up for him ------

## 2013-09-10 NOTE — Telephone Encounter (Signed)
Left a message on patients vm to return our call.

## 2013-09-11 NOTE — Telephone Encounter (Signed)
Patient left message, returning your call from yesterday

## 2013-09-11 NOTE — Telephone Encounter (Signed)
Notified pt and he states he will contact his cardiologist for appointment.

## 2013-09-25 ENCOUNTER — Telehealth: Payer: Self-pay | Admitting: Family Medicine

## 2013-09-25 ENCOUNTER — Other Ambulatory Visit: Payer: Self-pay | Admitting: Internal Medicine

## 2013-09-25 MED ORDER — LEVOTHYROXINE SODIUM 100 MCG PO TABS: 100.0000 ug | ORAL_TABLET | Freq: Every day | ORAL | Status: DC

## 2013-09-25 NOTE — Telephone Encounter (Signed)
Requesting refill on levothyroxine 143mcg

## 2013-09-25 NOTE — Telephone Encounter (Signed)
Refill sent.

## 2013-09-26 ENCOUNTER — Other Ambulatory Visit: Payer: Self-pay | Admitting: Family Medicine

## 2013-10-15 ENCOUNTER — Encounter: Payer: Self-pay | Admitting: Cardiovascular Disease

## 2013-10-15 ENCOUNTER — Other Ambulatory Visit: Payer: Self-pay | Admitting: Family

## 2013-10-15 ENCOUNTER — Ambulatory Visit (INDEPENDENT_AMBULATORY_CARE_PROVIDER_SITE_OTHER): Payer: Medicare Other | Admitting: Cardiovascular Disease

## 2013-10-15 VITALS — BP 100/50 | HR 50 | Ht 70.0 in | Wt 155.0 lb

## 2013-10-15 DIAGNOSIS — R5381 Other malaise: Secondary | ICD-10-CM

## 2013-10-15 DIAGNOSIS — E785 Hyperlipidemia, unspecified: Secondary | ICD-10-CM | POA: Diagnosis not present

## 2013-10-15 DIAGNOSIS — I714 Abdominal aortic aneurysm, without rupture, unspecified: Secondary | ICD-10-CM

## 2013-10-15 DIAGNOSIS — R5383 Other fatigue: Secondary | ICD-10-CM | POA: Diagnosis not present

## 2013-10-15 DIAGNOSIS — I2510 Atherosclerotic heart disease of native coronary artery without angina pectoris: Secondary | ICD-10-CM

## 2013-10-15 MED ORDER — ATORVASTATIN CALCIUM 20 MG PO TABS: 20.0000 mg | ORAL_TABLET | Freq: Every day | ORAL | Status: DC

## 2013-10-15 NOTE — Assessment & Plan Note (Signed)
His abdominal aorta is at the upper limits of normal 2. 8 cm .  Will need a follow up AAA duplex in 2018.

## 2013-10-15 NOTE — Progress Notes (Signed)
James [name] Date of Birth  1941/06/04       [name]    [employer] [address] [name], [name]  [address]   [place],   [address] [phone]     [phone]   Fax  [phone]    Fax [phone]  Problem List: 1. Chest pain-normal cardiac catheterization in July, 2013 2. Paroxysmal atrial fibrillation 3. Hyperlipidemia 4. Hypothyroidism  History of Present Illness: [name] is seen today for a post hospital visit. H He is a 73 year old male who is in transition with moving here from [place]. He has known CAD, hypothryoidism, HLD and GERD.  He comes in today. He is here with his sister. He presented to the ER after having an stress test in [place]. There was concern for atrial fib but no ischemia or infarct noted with normal EF. An echo showed normal LV function and no wall motion abnormalities. A cardiac CTA showed CAD with normal EF. He had an event monitor which just showed sinus tachycardia and PVC's. He presented with chest pain and underwent cardiac cath with Dr. Acie Fredrickson. He has mild but nonobstructive CAD. EF is normal. He will be managed medically. He has a resting bradycardia. He is a runner.  He reports today that he is feeling much better. He reports being at least 85% improved. He still notes some shortness of breath and chest pain, but it is significantly less. He had no relief with NTG. He does have reflux. He is back running at a moderate level. His symptoms occur with and without exertion. He is using caffeine and has daily alcohol use as well. He says his pulse ox is in the high 90's. He is wondering if this is more GI related. He has been on Nexium for many years. Remote EGD in 2008. Has GERD and hiatal hernia. No reports of asthma.   Sept. 19, 2013 He has done well .  He has not had any additional episodes of chest pain.  He was admitted in July, 2013 with chest pains. At cardiac catheterization he  was found to have minor luminal irregularities. Fasting lipids were well controlled. He has remained active. He's been running a regular basis. He's not had any further episodes of chest pain or shortness of breath.  October 15, 2013:  [name] is doing well.  Still running.   No Cp   He has had his lipids drawn.    Current Outpatient Prescriptions on File Prior to Visit  Medication Sig Dispense Refill  . aspirin 81 MG tablet Take 81 mg by mouth daily.      . Calcium Carb-Cholecalciferol (CALCIUM 1000 + D PO) Take 1 tablet by mouth daily.      . Dutasteride-Tamsulosin HCl (JALYN) 0.5-0.4 MG CAPS Take 1 capsule by mouth at bedtime.  90 capsule  0  . Ibuprofen-Diphenhydramine HCl (ADVIL PM) 200-25 MG CAPS Take 25 mg by mouth as needed.      Marland Kitchen levothyroxine (SYNTHROID, LEVOTHROID) 100 MCG tablet TAKE 1 TABLET (100 MCG TOTAL) BY MOUTH DAILY BEFORE BREAKFAST.  30 tablet  2  . meclizine (ANTIVERT) 25 MG tablet Take 1 tablet (25 mg total) by mouth 2 (two) times daily as needed for dizziness or nausea.  30 tablet  2  . MELATONIN PO Take by mouth at bedtime as needed.      . mirtazapine (REMERON) 15 MG tablet Take 1 tablet (15 mg  total) by mouth at bedtime.  90 tablet  0  . Multiple Vitamins-Minerals (MULTIVITAMINS THER. W/MINERALS) TABS Take 1 tablet by mouth daily.      . pantoprazole (PROTONIX) 40 MG tablet Take 1 tablet (40 mg total) by mouth 2 (two) times daily.  60 tablet  4  . pravastatin (PRAVACHOL) 20 MG tablet Take 1 tablet (20 mg total) by mouth daily.  90 tablet  0  . sildenafil (VIAGRA) 100 MG tablet Take 1 tablet (100 mg total) by mouth daily as needed for erectile dysfunction.  10 tablet  5  . Testosterone (AXIRON) 30 MG/ACT SOLN USE 1 PUMP UNDER EACH ARM EACH MORNING  90 mL  0  . zolpidem (AMBIEN) 10 MG tablet Take 1 tablet (10 mg total) by mouth at bedtime as needed for sleep.  30 tablet  5  . nitroGLYCERIN (NITROSTAT) 0.4 MG SL tablet Place 1 tablet (0.4 mg total) under the tongue every 5  (five) minutes x 3 doses as needed for chest pain.  25 tablet  3   No current facility-administered medications on file prior to visit.    Allergies  Allergen Reactions  . Sulfa Antibiotics Other (See Comments)    Childhood allergy    Past Medical History  Diagnosis Date  . CAD (coronary artery disease)     nonobstructive by cath 2008; nonobstructive by Cardiac CT 11/2011  . Hypothyroidism   . Atrial fibrillation   . Diverticulosis   . Mitral valve prolapse   . HLD (hyperlipidemia)   . GERD (gastroesophageal reflux disease)   . Hiatal hernia   . Cervical spine fracture 1980    C6-7  . 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 History of squamous cell carcinoma 10-05-10    Dr [name], [place]  . 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  . Syncope 02/03/2013  . Benign paroxysmal positional vertigo 04/06/2013  . History of atrial fibrillation   . AAA (abdominal aortic aneurysm) without rupture 09/08/2013  . Erectile dysfunction 09/08/2013    Past Surgical History  Procedure Laterality Date  . Appendectomy  05/2011  . Inguinal hernia repair  07/2010    right  . Knee arthroplasty      bilat  . Cardiac catheterization  01/03/12    30% ostial LAD stenosis, ostial 30-40% D1 stenosis, smooth and normal left main and RCA; LVEF 65%  . Tonsillectomy  1947  . Tumor removal  1958    "fatty tumor"    History  Smoking status  . Never Smoker   Smokeless tobacco  . Not on file    History  Alcohol Use  . Yes    Comment: 2 glasses red wine/day    Family History  Problem Relation Age of Onset  . Heart disease Mother     MI 54s  . Stroke Mother   . Hypertension Mother   . Heart disease Maternal Grandfather     MI 70-80s  . Heart disease Maternal Grandmother     MI 70-80s  . Lung disease Father     penumonitis  . Cancer Sister     colon  . Cancer Paternal Uncle     GI: colon he thinks    Reviw of Systems:  Reviewed in the HPI.  All other systems are  negative.  Physical Exam: Blood pressure 100/50, pulse 50, height 5\' 10"  (1.778 m), weight 155 lb (70.308 kg). General: Well developed, well nourished, in no acute distress.  Head:  Normocephalic, atraumatic, sclera non-icteric, mucus membranes are moist,   Neck: Supple. Carotids are 2 + without bruits. No JVD  Lungs: Clear bilaterally to auscultation.  Heart: regular rate.  normal  S1 S2. No murmurs, gallops or rubs.  Abdomen: Soft, non-tender, non-distended with normal bowel sounds. No hepatomegaly. No rebound/guarding. No masses.  Msk:  Strength and tone are normal  Extremities: No clubbing or cyanosis. No edema.  Distal pedal pulses are 2+ and equal bilaterally.  Neuro: Alert and oriented X 3. Moves all extremities spontaneously.  Psych:  Responds to questions appropriately with a normal affect.  ECG:   Assessment / Plan:

## 2013-10-15 NOTE — Patient Instructions (Addendum)
Your physician has recommended you make the following change in your medication:  STOP Pravachol (Pravastatin) START Atorvastatin 20 mg once daily  Your physician recommends that you return for lab work in: 3 months (July 28) - fasting cholesterol screening - do not eat or drink anything except water after midnight  Your physician wants you to follow-up in: 1 year with Dr. Acie Fredrickson.  You will receive a reminder letter in the mail two months in advance. If you don't receive a letter, please call our office to schedule the follow-up appointment.

## 2013-10-15 NOTE — Assessment & Plan Note (Signed)
He has mild CAD.  Will change his pravachol to atorvastatin 20.  Check lipids, liver, bMp in 3 months   Our goal LDL is 70.

## 2013-10-16 ENCOUNTER — Other Ambulatory Visit: Payer: Self-pay | Admitting: Physician Assistant

## 2013-10-16 ENCOUNTER — Ambulatory Visit (INDEPENDENT_AMBULATORY_CARE_PROVIDER_SITE_OTHER): Payer: Medicare Other | Admitting: Physician Assistant

## 2013-10-16 ENCOUNTER — Encounter: Payer: Self-pay | Admitting: Physician Assistant

## 2013-10-16 ENCOUNTER — Ambulatory Visit (HOSPITAL_BASED_OUTPATIENT_CLINIC_OR_DEPARTMENT_OTHER)
Admission: RE | Admit: 2013-10-16 | Discharge: 2013-10-16 | Disposition: A | Discharge status: Home / Self Care | Payer: Medicare Other | Source: Ambulatory Visit | Attending: Physician Assistant | Admitting: Physician Assistant

## 2013-10-16 VITALS — BP 108/78 | HR 46 | Temp 97.8°F | Resp 16 | Ht 70.0 in | Wt 155.2 lb

## 2013-10-16 DIAGNOSIS — M25569 Pain in unspecified knee: Secondary | ICD-10-CM | POA: Diagnosis not present

## 2013-10-16 DIAGNOSIS — M1710 Unilateral primary osteoarthritis, unspecified knee: Secondary | ICD-10-CM | POA: Diagnosis not present

## 2013-10-16 DIAGNOSIS — IMO0002 Reserved for concepts with insufficient information to code with codable children: Secondary | ICD-10-CM | POA: Diagnosis not present

## 2013-10-16 DIAGNOSIS — I2510 Atherosclerotic heart disease of native coronary artery without angina pectoris: Secondary | ICD-10-CM | POA: Diagnosis not present

## 2013-10-16 MED ORDER — MELOXICAM 7.5 MG PO TABS: 7.5000 mg | ORAL_TABLET | Freq: Every day | ORAL | Status: DC

## 2013-10-16 NOTE — Telephone Encounter (Signed)
Refill request for Axiron Last filled by MD on - 07/08/2013 #90 mL x0 Last Appt: 09/03/2013 Next Appt: 10/16/2013 Please advsie?

## 2013-10-16 NOTE — Patient Instructions (Signed)
Please go downstairs for imaging. Take 1 tablet of the Meloxicam daily over the next few days.  Do not take this and the Aleve.  Apply a topical Aspercreme.  Wear a knee brace.  Avoid running over the next week.  I will call you with your imaging results.  If symptoms persist, we may need to obtain MRI and/or set you up with a specialist.

## 2013-10-19 DIAGNOSIS — M179 Osteoarthritis of knee, unspecified: Secondary | ICD-10-CM | POA: Insufficient documentation

## 2013-10-19 DIAGNOSIS — M25569 Pain in unspecified knee: Secondary | ICD-10-CM | POA: Insufficient documentation

## 2013-10-19 NOTE — Progress Notes (Signed)
Patient presents to clinic today c/o medial and posterior right knee pain first noticed 1 month ago. Patient states symptoms worsened over the weekend after running. Patient denies trauma or twisting injury. Denies fall. Patient has been a runner for 40 years. Has history of knee surgeries and arthritis bilaterally. Patient states he has taken some Aleve with little relief of symptoms. Has not tried anything else for symptoms.  Past Medical History  Diagnosis Date  . CAD (coronary artery disease)     nonobstructive by cath 2008; nonobstructive by Cardiac CT 11/2011  . Hypothyroidism   . Atrial fibrillation   . Diverticulosis   . Mitral valve prolapse   . HLD (hyperlipidemia)   . GERD (gastroesophageal reflux disease)   . Hiatal hernia   . Cervical spine fracture 1980    C6-7  . 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 History of squamous cell carcinoma 10-05-10    Dr [name], [place]  . 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  . Syncope 02/03/2013  . Benign paroxysmal positional vertigo 04/06/2013  . History of atrial fibrillation   . AAA (abdominal aortic aneurysm) without rupture 09/08/2013  . Erectile dysfunction 09/08/2013    Current Outpatient Prescriptions on File Prior to Visit  Medication Sig Dispense Refill  . aspirin 81 MG tablet Take 81 mg by mouth daily.      Marland Kitchen atorvastatin (LIPITOR) 20 MG tablet Take 1 tablet (20 mg total) by mouth daily.  90 tablet  3  . Calcium Carb-Cholecalciferol (CALCIUM 1000 + D PO) Take 1 tablet by mouth daily.      . Dutasteride-Tamsulosin HCl (JALYN) 0.5-0.4 MG CAPS Take 1 capsule by mouth at bedtime.  90 capsule  0  . Ibuprofen-Diphenhydramine HCl (ADVIL PM) 200-25 MG CAPS Take 25 mg by mouth as needed.      Marland Kitchen levothyroxine (SYNTHROID, LEVOTHROID) 100 MCG tablet TAKE 1 TABLET (100 MCG TOTAL) BY MOUTH DAILY BEFORE BREAKFAST.  30 tablet  2  . meclizine (ANTIVERT) 25 MG tablet Take 1 tablet (25 mg total) by mouth 2 (two) times daily as  needed for dizziness or nausea.  30 tablet  2  . MELATONIN PO Take by mouth at bedtime as needed.      . mirtazapine (REMERON) 15 MG tablet Take 1 tablet (15 mg total) by mouth at bedtime.  90 tablet  0  . Multiple Vitamins-Minerals (MULTIVITAMINS THER. W/MINERALS) TABS Take 1 tablet by mouth daily.      . nitroGLYCERIN (NITROSTAT) 0.4 MG SL tablet Place 1 tablet (0.4 mg total) under the tongue every 5 (five) minutes x 3 doses as needed for chest pain.  25 tablet  3  . pantoprazole (PROTONIX) 40 MG tablet Take 1 tablet (40 mg total) by mouth 2 (two) times daily.  60 tablet  4  . sildenafil (VIAGRA) 100 MG tablet Take 1 tablet (100 mg total) by mouth daily as needed for erectile dysfunction.  10 tablet  5  . zolpidem (AMBIEN) 10 MG tablet Take 1 tablet (10 mg total) by mouth at bedtime as needed for sleep.  30 tablet  5  . AXIRON 30 MG/ACT SOLN USE 1 PUMP UNDER EACH ARM EACH MORNING  90 mL  1   No current facility-administered medications on file prior to visit.    Allergies  Allergen Reactions  . Sulfa Antibiotics Other (See Comments)    Childhood allergy    Family History  Problem Relation Age of Onset  .  Heart disease Mother     MI 38s  . Stroke Mother   . Hypertension Mother   . Heart disease Maternal Grandfather     MI 70-80s  . Heart disease Maternal Grandmother     MI 70-80s  . Lung disease Father     penumonitis  . Cancer Sister     colon  . Cancer Paternal Uncle     GI: colon he thinks    History   Social History  . Marital Status: Single    Spouse Name: N/A    Number of Children: N/A  . Years of Education: N/A   Occupational History  . Chief Financial Officer     Retired   Social History Main Topics  . Smoking status: Never Smoker   . Smokeless tobacco: None  . Alcohol Use: Yes     Comment: 2 glasses red wine/day  . Drug Use: No  . Sexual Activity: Yes   Other Topics Concern  . None   Social History Narrative  . None   Review of Systems - See HPI.  All other  ROS are negative.  BP 108/78  Pulse 46  Temp(Src) 97.8 F (36.6 C) (Oral)  Resp 16  Ht 5\' 10"  (1.778 m)  Wt 155 lb 4 oz (70.421 kg)  BMI 22.28 kg/m2  SpO2 98%  Physical Exam  Vitals reviewed. Constitutional: He is oriented to person, place, and time and well-developed, well-nourished, and in no distress.  HENT:  Head: Normocephalic and atraumatic.  Eyes: Conjunctivae are normal. Pupils are equal, round, and reactive to light.  Neck: Neck supple.  Cardiovascular: Normal rate, regular rhythm, normal heart sounds and intact distal pulses.   Pulmonary/Chest: Effort normal and breath sounds normal. No respiratory distress. He has no wheezes. He has no rales. He exhibits no tenderness.  Musculoskeletal:       Right knee: He exhibits normal range of motion, no swelling, no effusion, no deformity, no laceration, normal alignment, no LCL laxity, normal patellar mobility and no MCL laxity. Tenderness found. Medial joint line tenderness noted. No lateral joint line, no MCL, no LCL and no patellar tendon tenderness noted.       Right upper leg: Normal.       Right lower leg: Normal. He exhibits no tenderness.       Right foot: Normal.  Neurological: He is alert and oriented to person, place, and time. He has normal reflexes. No cranial nerve deficit. Gait normal.  Skin: Skin is warm. No rash noted.    Recent Results (from the past 2160 hour(s))  CBC     Status: None   Collection Time    09/03/13  3:18 PM      Result Value Ref Range   WBC 6.8  4.0 - 10.5 K/uL   RBC 4.53  4.22 - 5.81 MIL/uL   Hemoglobin 14.3  13.0 - 17.0 g/dL   HCT 40.6  39.0 - 52.0 %   MCV 89.6  78.0 - 100.0 fL   MCH 31.6  26.0 - 34.0 pg   MCHC 35.2  30.0 - 36.0 g/dL   RDW 13.5  11.5 - 15.5 %   Platelets 239  150 - 400 K/uL  RENAL FUNCTION PANEL     Status: None   Collection Time    09/03/13  3:18 PM      Result Value Ref Range   Sodium 139  135 - 145 mEq/L   Potassium 4.3  3.5 - 5.3 mEq/L  Chloride 100  96 -  112 mEq/L   CO2 29  19 - 32 mEq/L   Glucose, Bld 97  70 - 99 mg/dL   BUN 16  6 - 23 mg/dL   Creat 1.02  0.50 - 1.35 mg/dL   Albumin 4.2  3.5 - 5.2 g/dL   Calcium 9.4  8.4 - 10.5 mg/dL   Phosphorus 3.6  2.3 - 4.6 mg/dL  HEPATIC FUNCTION PANEL     Status: None   Collection Time    09/03/13  3:18 PM      Result Value Ref Range   Total Bilirubin 0.6  0.2 - 1.2 mg/dL   Bilirubin, Direct 0.1  0.0 - 0.3 mg/dL   Indirect Bilirubin 0.5  0.2 - 1.2 mg/dL   Alkaline Phosphatase 55  39 - 117 U/L   AST 24  0 - 37 U/L   ALT 16  0 - 53 U/L   Total Protein 7.0  6.0 - 8.3 g/dL   Albumin 4.2  3.5 - 5.2 g/dL  TSH     Status: None   Collection Time    09/03/13  3:18 PM      Result Value Ref Range   TSH 1.231  0.350 - 4.500 uIU/mL  LIPID PANEL     Status: None   Collection Time    09/03/13  3:18 PM      Result Value Ref Range   Cholesterol 161  0 - 200 mg/dL   Comment: ATP III Classification:           < 200        mg/dL        Desirable          200 - 239     mg/dL        Borderline High          >= 240        mg/dL        High         Triglycerides 65  <150 mg/dL   HDL 63  >39 mg/dL   Total CHOL/HDL Ratio 2.6     VLDL 13  0 - 40 mg/dL   LDL Cholesterol 85  0 - 99 mg/dL   Comment:       Total Cholesterol/HDL Ratio:CHD Risk                            Coronary Heart Disease Risk Table                                            Men       Women              1/2 Average Risk              3.4        3.3                  Average Risk              5.0        4.4               2X Average Risk              9.6  7.1               3X Average Risk             23.4       11.0     Use the calculated Patient Ratio above and the CHD Risk table      to determine the patient's CHD Risk.     ATP III Classification (LDL):           < 100        mg/dL         Optimal          100 - 129     mg/dL         Near or Above Optimal          130 - 159     mg/dL         Borderline High          160 - 189      mg/dL         High           > 190        mg/dL         Very High        TESTOSTERONE     Status: None   Collection Time    09/03/13  3:18 PM      Result Value Ref Range   Testosterone 531  300 - 890 ng/dL   Comment:           Tanner Stage       Male              Male                   I              < 30 ng/dL        < 10 ng/dL                   II             < 150 ng/dL       < 30 ng/dL                   III            100-320 ng/dL     < 35 ng/dL                   IV             200-970 ng/dL     15-40 ng/dL                   V/Adult        300-890 ng/dL     10-70 ng/dL          Assessment/Plan: Knee pain Rx Mobic. Knee brace given. RICE. Topical Aspercreme or Salon Pas. Avoid running over the next week. Heating pad to area. Will obtain x-ray of right knee. If x-ray unremarkable and symptoms not improving, we'll proceed with MRI and referral to specialty.

## 2013-10-19 NOTE — Assessment & Plan Note (Signed)
Rx Mobic. Knee brace given. RICE. Topical Aspercreme or Salon Pas. Avoid running over the next week. Heating pad to area. Will obtain x-ray of right knee. If x-ray unremarkable and symptoms not improving, we'll proceed with MRI and referral to specialty.

## 2013-10-21 ENCOUNTER — Telehealth: Payer: Self-pay | Admitting: *Deleted

## 2013-10-21 DIAGNOSIS — M25569 Pain in unspecified knee: Secondary | ICD-10-CM

## 2013-10-21 NOTE — Telephone Encounter (Signed)
Notified pt and he wants to know how long he should wait for improvement to begin? States he is having difficulty walking and pain is just as bad and may be a slightly worse.  Please advise.

## 2013-10-21 NOTE — Telephone Encounter (Signed)
Message copied by Ronny Flurry on Mon Oct 21, 2013  2:55 PM ------      Message from: Raiford Noble      Created: Wed Oct 16, 2013 10:37 PM       X-ray shows mild osteoarthritic changes but no evidence of major pathology.  Continue care as discussed at today's visit.  IF symptoms not improving, patient to call or RTC.  Can also set him up with a Sports Medicine Physician if he would like. ------

## 2013-10-21 NOTE — Telephone Encounter (Signed)
I have placed the referral to Orthopedics.  He will be contacted by their office for appointment date/time.  If symptoms are worsening and he needs to be seen before his appointment with Ortho, he should return to clinic.

## 2013-10-21 NOTE — Telephone Encounter (Signed)
Notified pt and he voices understanding. 

## 2013-10-24 ENCOUNTER — Telehealth: Payer: Self-pay | Admitting: Family Medicine

## 2013-10-24 MED ORDER — DUTASTERIDE-TAMSULOSIN HCL 0.5-0.4 MG PO CAPS: 1.0000 | ORAL_CAPSULE | Freq: Every day | ORAL | Status: DC

## 2013-10-24 MED ORDER — MIRTAZAPINE 15 MG PO TABS: 15.0000 mg | ORAL_TABLET | Freq: Every day | ORAL | Status: DC

## 2013-10-24 NOTE — Telephone Encounter (Signed)
Refill-jalyn  [facility]

## 2013-10-24 NOTE — Telephone Encounter (Signed)
Refill mirtazapine

## 2013-11-01 ENCOUNTER — Other Ambulatory Visit: Payer: Self-pay | Admitting: Internal Medicine

## 2013-11-06 DIAGNOSIS — IMO0002 Reserved for concepts with insufficient information to code with codable children: Secondary | ICD-10-CM | POA: Diagnosis not present

## 2013-11-26 ENCOUNTER — Other Ambulatory Visit: Payer: Self-pay | Admitting: Family Medicine

## 2013-11-26 NOTE — Telephone Encounter (Signed)
Please advise refill? Last rx was done on 10-16-13 90 ml with 1 refill

## 2013-11-26 NOTE — Telephone Encounter (Signed)
Refill- axiron  [facility]

## 2013-11-27 MED ORDER — TESTOSTERONE 30 MG/ACT TD SOLN: 1.0000 "application " | Freq: Every morning | TRANSDERMAL | Status: DC

## 2013-11-27 NOTE — Telephone Encounter (Signed)
RX faxed

## 2013-12-26 DIAGNOSIS — Z5189 Encounter for other specified aftercare: Secondary | ICD-10-CM | POA: Diagnosis not present

## 2013-12-31 ENCOUNTER — Telehealth: Payer: Self-pay | Admitting: Family Medicine

## 2013-12-31 MED ORDER — MIRTAZAPINE 15 MG PO TABS: 15.0000 mg | ORAL_TABLET | Freq: Every day | ORAL | Status: DC

## 2013-12-31 NOTE — Telephone Encounter (Signed)
Refill- mirtazapine  [facility]

## 2013-12-31 NOTE — Telephone Encounter (Signed)
Per pharmacy, current request is cycle fill request when current refill expires. Refill sent.

## 2014-01-14 ENCOUNTER — Other Ambulatory Visit: Payer: Medicare Other

## 2014-01-17 ENCOUNTER — Other Ambulatory Visit (INDEPENDENT_AMBULATORY_CARE_PROVIDER_SITE_OTHER): Payer: Medicare Other

## 2014-01-17 DIAGNOSIS — R5383 Other fatigue: Secondary | ICD-10-CM | POA: Diagnosis not present

## 2014-01-17 DIAGNOSIS — E785 Hyperlipidemia, unspecified: Secondary | ICD-10-CM | POA: Diagnosis not present

## 2014-01-17 DIAGNOSIS — R5381 Other malaise: Secondary | ICD-10-CM

## 2014-01-17 LAB — LIPID PANEL
Cholesterol: 141 mg/dL (ref 0–200)
HDL: 63.3 mg/dL (ref >39.00)
LDL CALC: 64 mg/dL (ref 0–99)
NONHDL: 77.7
Total CHOL/HDL Ratio: 2
Triglycerides: 68 mg/dL (ref 0.0–149.0)
VLDL: 13.6 mg/dL (ref 0.0–40.0)

## 2014-01-17 LAB — HEPATIC FUNCTION PANEL
ALT: 19 U/L (ref 0–53)
AST: 23 U/L (ref 0–37)
Albumin: 4.1 g/dL (ref 3.5–5.2)
Alkaline Phosphatase: 58 U/L (ref 39–117)
Bilirubin, Direct: 0.2 mg/dL (ref 0.0–0.3)
Total Bilirubin: 1.1 mg/dL (ref 0.2–1.2)
Total Protein: 7.1 g/dL (ref 6.0–8.3)

## 2014-01-17 LAB — BASIC METABOLIC PANEL
BUN: 12 mg/dL (ref 6–23)
CO2: 32 meq/L (ref 19–32)
Calcium: 9.2 mg/dL (ref 8.4–10.5)
Chloride: 101 mEq/L (ref 96–112)
Creatinine, Ser: 1 mg/dL (ref 0.4–1.5)
GFR: 75.01 mL/min (ref >60.00)
GLUCOSE: 87 mg/dL (ref 70–99)
POTASSIUM: 4.3 meq/L (ref 3.5–5.1)
SODIUM: 137 meq/L (ref 135–145)

## 2014-01-20 ENCOUNTER — Other Ambulatory Visit: Payer: Self-pay

## 2014-01-20 DIAGNOSIS — G4700 Insomnia, unspecified: Secondary | ICD-10-CM

## 2014-01-20 MED ORDER — ZOLPIDEM TARTRATE 10 MG PO TABS: 10.0000 mg | ORAL_TABLET | Freq: Every evening | ORAL | Status: DC | PRN

## 2014-01-20 NOTE — Telephone Encounter (Signed)
Per md, ok to refill Zolpidem same strength, same sig w/ 5 refills

## 2014-01-21 ENCOUNTER — Other Ambulatory Visit: Payer: Self-pay | Admitting: Family Medicine

## 2014-01-29 ENCOUNTER — Telehealth: Payer: Self-pay | Admitting: Family Medicine

## 2014-01-29 MED ORDER — PANTOPRAZOLE SODIUM 40 MG PO TBEC: 40.0000 mg | DELAYED_RELEASE_TABLET | Freq: Two times a day (BID) | ORAL | Status: DC

## 2014-01-29 MED ORDER — TESTOSTERONE 30 MG/ACT TD SOLN: 1.0000 "application " | Freq: Every morning | TRANSDERMAL | Status: DC

## 2014-01-29 MED ORDER — DUTASTERIDE-TAMSULOSIN HCL 0.5-0.4 MG PO CAPS: 1.0000 | ORAL_CAPSULE | Freq: Every day | ORAL | Status: DC

## 2014-01-29 NOTE — Telephone Encounter (Signed)
Last axiron Rx printed on 11/27/13 #90 1 refill. Pt has upcoming appt 03/11/14. Rx printed and sent to provider for signature.  [facility]'s [facility] sent to [facility] in error. Called to cancel them. LDM

## 2014-01-29 NOTE — Telephone Encounter (Signed)
Refill- pantoprazole  Refill-axiron  Refill- jalyn  [facility]

## 2014-03-11 ENCOUNTER — Encounter: Payer: Self-pay | Admitting: Family Medicine

## 2014-03-11 ENCOUNTER — Ambulatory Visit (INDEPENDENT_AMBULATORY_CARE_PROVIDER_SITE_OTHER): Payer: Medicare Other | Admitting: Family Medicine

## 2014-03-11 VITALS — BP 138/82 | HR 49 | Temp 98.2°F | Ht 70.0 in | Wt 156.4 lb

## 2014-03-11 DIAGNOSIS — E785 Hyperlipidemia, unspecified: Secondary | ICD-10-CM | POA: Diagnosis not present

## 2014-03-11 DIAGNOSIS — K219 Gastro-esophageal reflux disease without esophagitis: Secondary | ICD-10-CM

## 2014-03-11 DIAGNOSIS — G4700 Insomnia, unspecified: Secondary | ICD-10-CM

## 2014-03-11 DIAGNOSIS — Z0000 Encounter for general adult medical examination without abnormal findings: Secondary | ICD-10-CM | POA: Diagnosis not present

## 2014-03-11 DIAGNOSIS — I4891 Unspecified atrial fibrillation: Secondary | ICD-10-CM

## 2014-03-11 DIAGNOSIS — K5730 Diverticulosis of large intestine without perforation or abscess without bleeding: Secondary | ICD-10-CM

## 2014-03-11 DIAGNOSIS — E291 Testicular hypofunction: Secondary | ICD-10-CM | POA: Diagnosis not present

## 2014-03-11 DIAGNOSIS — E039 Hypothyroidism, unspecified: Secondary | ICD-10-CM

## 2014-03-11 DIAGNOSIS — I2583 Coronary atherosclerosis due to lipid rich plaque: Secondary | ICD-10-CM

## 2014-03-11 DIAGNOSIS — M25561 Pain in right knee: Secondary | ICD-10-CM

## 2014-03-11 DIAGNOSIS — R7989 Other specified abnormal findings of blood chemistry: Secondary | ICD-10-CM

## 2014-03-11 DIAGNOSIS — Z125 Encounter for screening for malignant neoplasm of prostate: Secondary | ICD-10-CM

## 2014-03-11 DIAGNOSIS — Z23 Encounter for immunization: Secondary | ICD-10-CM

## 2014-03-11 DIAGNOSIS — Z8679 Personal history of other diseases of the circulatory system: Secondary | ICD-10-CM

## 2014-03-11 DIAGNOSIS — I2510 Atherosclerotic heart disease of native coronary artery without angina pectoris: Secondary | ICD-10-CM

## 2014-03-11 MED ORDER — ZOLPIDEM TARTRATE 10 MG PO TABS: 10.0000 mg | ORAL_TABLET | Freq: Every evening | ORAL | Status: DC | PRN

## 2014-03-11 NOTE — Progress Notes (Signed)
Patient ID: James [name], male   DOB: 1940-11-09, 73 y.o.   MRN: [id number] James [name] [id number] 1941-06-04 03/11/2014      Progress Note-Follow Up  Subjective  Chief Complaint  Chief Complaint  Patient presents with  . Annual Exam    physical  . Injections    flu and prevnar    HPI  Patient is a 73 year old male in today for routine medical care. Patient is in today for annual exam. Generally doing well. Notes reading glasses only. Since having cataract removed systems mouth. Has a cardiologist but has not needed recently. Sees dermatology at [facility]. Encouraged heart healthy diet, increase exercise, avoid trans fats, consider a krill oil cap daily  Past Medical History  Diagnosis Date  . CAD (coronary artery disease)     nonobstructive by cath 2008; nonobstructive by Cardiac CT 11/2011  . Hypothyroidism   . Atrial fibrillation   . Diverticulosis   . Mitral valve prolapse   . HLD (hyperlipidemia)   . GERD (gastroesophageal reflux disease)   . Hiatal hernia   . Cervical spine fracture 1980    C6-7  . 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 History of squamous cell carcinoma 10-05-10    Dr [name], [place]  . 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  . Syncope 02/03/2013  . Benign paroxysmal positional vertigo 04/06/2013  . History of atrial fibrillation   . AAA (abdominal aortic aneurysm) without rupture 09/08/2013  . Erectile dysfunction 09/08/2013    Past Surgical History  Procedure Laterality Date  . Appendectomy  05/2011  . Inguinal hernia repair  07/2010    right  . Knee arthroplasty      bilat  . Cardiac catheterization  01/03/12    30% ostial LAD stenosis, ostial 30-40% D1 stenosis, smooth and normal left main and RCA; LVEF 65%  . Tonsillectomy  1947  . Tumor removal  1958    "fatty tumor"    Family History  Problem Relation Age of Onset  . Heart disease Mother     MI 16s  . Stroke Mother   . Hypertension Mother   . Heart  disease Maternal Grandfather     MI 70-80s  . Heart disease Maternal Grandmother     MI 70-80s  . Lung disease Father     penumonitis  . Cancer Sister     colon  . Cancer Paternal Uncle     GI: colon he thinks    History   Social History  . Marital Status: Single    Spouse Name: N/A    Number of Children: N/A  . Years of Education: N/A   Occupational History  . Chief Financial Officer     Retired   Social History Main Topics  . Smoking status: Never Smoker   . Smokeless tobacco: Not on file  . Alcohol Use: Yes     Comment: 2 glasses red wine/day  . Drug Use: No  . Sexual Activity: Yes   Other Topics Concern  . Not on file   Social History Narrative  . No narrative on file    Current Outpatient Prescriptions on File Prior to Visit  Medication Sig Dispense Refill  . aspirin 81 MG tablet Take 81 mg by mouth daily.      Marland Kitchen atorvastatin (LIPITOR) 20 MG tablet Take 1 tablet (20 mg total) by mouth daily.  90 tablet  3  . Calcium Carb-Cholecalciferol (CALCIUM 1000 + D PO)  Take 1 tablet by mouth daily.      . Dutasteride-Tamsulosin HCl (JALYN) 0.5-0.4 MG CAPS Take 1 capsule by mouth at bedtime.  90 capsule  0  . Ibuprofen-Diphenhydramine HCl (ADVIL PM) 200-25 MG CAPS Take 25 mg by mouth as needed.      Marland Kitchen levothyroxine (SYNTHROID, LEVOTHROID) 100 MCG tablet TAKE 1 TABLET (100 MCG TOTAL) BY MOUTH DAILY BEFORE BREAKFAST.  30 tablet  2  . meclizine (ANTIVERT) 25 MG tablet Take 1 tablet (25 mg total) by mouth 2 (two) times daily as needed for dizziness or nausea.  30 tablet  2  . MELATONIN PO Take by mouth at bedtime as needed.      . mirtazapine (REMERON) 15 MG tablet Take 1 tablet (15 mg total) by mouth at bedtime.  90 tablet  1  . Multiple Vitamins-Minerals (MULTIVITAMINS THER. W/MINERALS) TABS Take 1 tablet by mouth daily.      . pantoprazole (PROTONIX) 40 MG tablet Take 1 tablet (40 mg total) by mouth 2 (two) times daily.  60 tablet  1  . sildenafil (VIAGRA) 100 MG tablet Take 1 tablet  (100 mg total) by mouth daily as needed for erectile dysfunction.  10 tablet  5  . Testosterone (AXIRON) 30 MG/ACT SOLN Apply 1 application topically every morning. Under each arm  90 mL  1  . nitroGLYCERIN (NITROSTAT) 0.4 MG SL tablet Place 1 tablet (0.4 mg total) under the tongue every 5 (five) minutes x 3 doses as needed for chest pain.  25 tablet  3   No current facility-administered medications on file prior to visit.    Allergies  Allergen Reactions  . Sulfa Antibiotics Other (See Comments)    Childhood allergy    Review of Systems  Review of Systems  Constitutional: Negative for fever and malaise/fatigue.  HENT: Negative for congestion.   Eyes: Negative for discharge.  Respiratory: Negative for shortness of breath.   Cardiovascular: Negative for chest pain, palpitations and leg swelling.  Gastrointestinal: Negative for nausea, abdominal pain and diarrhea.  Genitourinary: Negative for dysuria.  Musculoskeletal: Negative for falls.  Skin: Negative for rash.  Neurological: Negative for loss of consciousness and headaches.  Endo/Heme/Allergies: Negative for polydipsia.  Psychiatric/Behavioral: Negative for depression and suicidal ideas. The patient is not nervous/anxious and does not have insomnia.     Objective  BP 138/82  Pulse 49  Temp(Src) 98.2 F (36.8 C) (Oral)  Ht 5\' 10"  (1.778 m)  Wt 156 lb 6.4 oz (70.943 kg)  BMI 22.44 kg/m2  SpO2 100%  Physical Exam  Physical Exam  Constitutional: He is oriented to person, place, and time and well-developed, well-nourished, and in no distress. No distress.  HENT:  Head: Normocephalic and atraumatic.  Eyes: Conjunctivae are normal.  Neck: Neck supple. No thyromegaly present.  Cardiovascular: Normal rate, regular rhythm and normal heart sounds.   No murmur heard. Pulmonary/Chest: Effort normal and breath sounds normal. No respiratory distress.  Abdominal: He exhibits no distension and no mass. There is no tenderness.   Musculoskeletal: He exhibits no edema.  Neurological: He is alert and oriented to person, place, and time.  Skin: Skin is warm.  Psychiatric: Memory, affect and judgment normal.    Lab Results  Component Value Date   TSH 1.231 09/03/2013   Lab Results  Component Value Date   WBC 6.8 09/03/2013   HGB 14.3 09/03/2013   HCT 40.6 09/03/2013   MCV 89.6 09/03/2013   PLT 239 09/03/2013   Lab Results  Component Value Date   CREATININE 1.0 01/17/2014   BUN 12 01/17/2014   NA 137 01/17/2014   K 4.3 01/17/2014   CL 101 01/17/2014   CO2 32 01/17/2014   Lab Results  Component Value Date   ALT 19 01/17/2014   AST 23 01/17/2014   ALKPHOS 58 01/17/2014   BILITOT 1.1 01/17/2014   Lab Results  Component Value Date   CHOL 141 01/17/2014   Lab Results  Component Value Date   HDL 63.30 01/17/2014   Lab Results  Component Value Date   LDLCALC 64 01/17/2014   Lab Results  Component Value Date   TRIG 68.0 01/17/2014   Lab Results  Component Value Date   CHOLHDL 2 01/17/2014     Assessment & Plan  GERD (gastroesophageal reflux disease) Avoid offending foods, start probiotics. Do not eat large meals in late evening and consider raising head of bed.   Hypothyroidism On Levothyroxine, continue to monitor  Hyperlipidemia Tolerating statin, encouraged heart healthy diet, avoid trans fats, minimize simple carbs and saturated fats. Increase exercise as tolerated  Low testosterone Doing well with treatment will continue the same  Knee pain Consider ice and topical treatments. Consider return to ortho if worsens  Medicare annual wellness visit, subsequent Patient encouraged to maintain heart healthy diet, regular exercise, adequate sleep. Consider daily probiotics. Take medications as prescribed. Reviewed labs. Sees GI for colonoscopy, last colonoscopy in 2013. Sees [facility] and [facility] caardiology Dr Johnsie Cancel

## 2014-03-11 NOTE — Patient Instructions (Signed)
Watch the blue lights  Insomnia Insomnia is frequent trouble falling and/or staying asleep. Insomnia can be a long term problem or a short term problem. Both are common. Insomnia can be a short term problem when the wakefulness is related to a certain stress or worry. Long term insomnia is often related to ongoing stress during waking hours and/or poor sleeping habits. Overtime, sleep deprivation itself can make the problem worse. Every little thing feels more severe because you are overtired and your ability to cope is decreased. CAUSES   Stress, anxiety, and depression.  Poor sleeping habits.  Distractions such as TV in the bedroom.  Naps close to bedtime.  Engaging in emotionally charged conversations before bed.  Technical reading before sleep.  Alcohol and other sedatives. They may make the problem worse. They can hurt normal sleep patterns and normal dream activity.  Stimulants such as caffeine for several hours prior to bedtime.  Pain syndromes and shortness of breath can cause insomnia.  Exercise late at night.  Changing time zones may cause sleeping problems (jet lag). It is sometimes helpful to have someone observe your sleeping patterns. They should look for periods of not breathing during the night (sleep apnea). They should also look to see how long those periods last. If you live alone or observers are uncertain, you can also be observed at a sleep clinic where your sleep patterns will be professionally monitored. Sleep apnea requires a checkup and treatment. Give your caregivers your medical history. Give your caregivers observations your family has made about your sleep.  SYMPTOMS   Not feeling rested in the morning.  Anxiety and restlessness at bedtime.  Difficulty falling and staying asleep. TREATMENT   Your caregiver may prescribe treatment for an underlying medical disorders. Your caregiver can give advice or help if you are using alcohol or other drugs for  self-medication. Treatment of underlying problems will usually eliminate insomnia problems.  Medications can be prescribed for short time use. They are generally not recommended for lengthy use.  Over-the-counter sleep medicines are not recommended for lengthy use. They can be habit forming.  You can promote easier sleeping by making lifestyle changes such as:  Using relaxation techniques that help with breathing and reduce muscle tension.  Exercising earlier in the day.  Changing your diet and the time of your last meal. No night time snacks.  Establish a regular time to go to bed.  Counseling can help with stressful problems and worry.  Soothing music and white noise may be helpful if there are background noises you cannot remove.  Stop tedious detailed work at least one hour before bedtime. HOME CARE INSTRUCTIONS   Keep a diary. Inform your caregiver about your progress. This includes any medication side effects. See your caregiver regularly. Take note of:  Times when you are asleep.  Times when you are awake during the night.  The quality of your sleep.  How you feel the next day. This information will help your caregiver care for you.  Get out of bed if you are still awake after 15 minutes. Read or do some quiet activity. Keep the lights down. Wait until you feel sleepy and go back to bed.  Keep regular sleeping and waking hours. Avoid naps.  Exercise regularly.  Avoid distractions at bedtime. Distractions include watching television or engaging in any intense or detailed activity like attempting to balance the household checkbook.  Develop a bedtime ritual. Keep a familiar routine of bathing, brushing your teeth, climbing  into bed at the same time each night, listening to soothing music. Routines increase the success of falling to sleep faster.  Use relaxation techniques. This can be using breathing and muscle tension release routines. It can also include visualizing  peaceful scenes. You can also help control troubling or intruding thoughts by keeping your mind occupied with boring or repetitive thoughts like the old concept of counting sheep. You can make it more creative like imagining planting one beautiful flower after another in your backyard garden.  During your day, work to eliminate stress. When this is not possible use some of the previous suggestions to help reduce the anxiety that accompanies stressful situations. MAKE SURE YOU:   Understand these instructions.  Will watch your condition.  Will get help right away if you are not doing well or get worse. Document Released: 06/03/2000 Document Revised: 08/29/2011 Document Reviewed: 07/04/2007 [facility] Patient Information 2015 [place], [place]. This information is not intended to replace advice given to you by your health care provider. Make sure you discuss any questions you have with your health care provider.

## 2014-03-11 NOTE — Progress Notes (Signed)
Pre visit review using our clinic review tool, if applicable. No additional management support is needed unless otherwise documented below in the visit note. 

## 2014-03-12 LAB — CBC
HCT: 42.8 % (ref 39.0–52.0)
Hemoglobin: 14.4 g/dL (ref 13.0–17.0)
MCHC: 33.8 g/dL (ref 30.0–36.0)
MCV: 95.6 fl (ref 78.0–100.0)
Platelets: 228 10*3/uL (ref 150.0–400.0)
RBC: 4.47 Mil/uL (ref 4.22–5.81)
RDW: 13.7 % (ref 11.5–15.5)
WBC: 6.9 10*3/uL (ref 4.0–10.5)

## 2014-03-12 LAB — PSA, MEDICARE: PSA: 0.62 ng/ml (ref 0.10–4.00)

## 2014-03-12 LAB — TESTOSTERONE: Testosterone: 390.3 ng/dL (ref 300.00–890.00)

## 2014-03-12 LAB — TSH: TSH: 1.28 u[IU]/mL (ref 0.35–4.50)

## 2014-03-16 ENCOUNTER — Encounter: Payer: Self-pay | Admitting: Family Medicine

## 2014-03-16 DIAGNOSIS — Z0000 Encounter for general adult medical examination without abnormal findings: Secondary | ICD-10-CM | POA: Insufficient documentation

## 2014-03-16 DIAGNOSIS — K5730 Diverticulosis of large intestine without perforation or abscess without bleeding: Secondary | ICD-10-CM | POA: Insufficient documentation

## 2014-03-16 DIAGNOSIS — G4700 Insomnia, unspecified: Secondary | ICD-10-CM | POA: Insufficient documentation

## 2014-03-16 DIAGNOSIS — R7989 Other specified abnormal findings of blood chemistry: Secondary | ICD-10-CM | POA: Insufficient documentation

## 2014-03-16 HISTORY — DX: Encounter for general adult medical examination without abnormal findings: Z00.00

## 2014-03-16 NOTE — Assessment & Plan Note (Signed)
Doing well with treatment will continue the same

## 2014-03-16 NOTE — Assessment & Plan Note (Signed)
Avoid offending foods, start probiotics. Do not eat large meals in late evening and consider raising head of bed.  

## 2014-03-16 NOTE — Assessment & Plan Note (Addendum)
Patient encouraged to maintain heart healthy diet, regular exercise, adequate sleep. Consider daily probiotics. Take medications as prescribed. Reviewed labs. Sees GI for colonoscopy, last colonoscopy in 2013. Sees [facility] and [facility] and caardiology Dr Johnsie Cancel

## 2014-03-16 NOTE — Assessment & Plan Note (Signed)
Tolerating statin, encouraged heart healthy diet, avoid trans fats, minimize simple carbs and saturated fats. Increase exercise as tolerated 

## 2014-03-16 NOTE — Assessment & Plan Note (Signed)
Consider ice and topical treatments. Consider return to ortho if worsens

## 2014-03-16 NOTE — Assessment & Plan Note (Signed)
On Levothyroxine, continue to monitor 

## 2014-03-26 ENCOUNTER — Telehealth: Payer: Self-pay | Admitting: *Deleted

## 2014-03-26 NOTE — Telephone Encounter (Signed)
PA for Ambien 10mg  approved effective 01/25/2014 through 106/11/2014. Approval letter sent for scanning. JG//CMA

## 2014-04-04 ENCOUNTER — Telehealth: Payer: Self-pay

## 2014-04-04 NOTE — Telephone Encounter (Signed)
Pharmacy is requesting Axiron refill?  Last RX was done on 01-29-14 quantity 90 with 1 refill

## 2014-04-04 NOTE — Telephone Encounter (Signed)
OK to refill Axiron with same sig, same amount disp # 1 bottle with 2 rf

## 2014-04-07 MED ORDER — TESTOSTERONE 30 MG/ACT TD SOLN: 1.0000 "application " | Freq: Every morning | TRANSDERMAL | Status: DC

## 2014-04-07 NOTE — Telephone Encounter (Signed)
rx printed for md to sign then fax to pharmacy

## 2014-04-23 DIAGNOSIS — L821 Other seborrheic keratosis: Secondary | ICD-10-CM | POA: Diagnosis not present

## 2014-04-23 DIAGNOSIS — D485 Neoplasm of uncertain behavior of skin: Secondary | ICD-10-CM | POA: Diagnosis not present

## 2014-04-23 DIAGNOSIS — L570 Actinic keratosis: Secondary | ICD-10-CM | POA: Diagnosis not present

## 2014-05-01 ENCOUNTER — Other Ambulatory Visit: Payer: Self-pay

## 2014-05-01 MED ORDER — LEVOTHYROXINE SODIUM 100 MCG PO TABS: 100.0000 ug | ORAL_TABLET | Freq: Every day | ORAL | Status: DC

## 2014-05-13 ENCOUNTER — Ambulatory Visit (INDEPENDENT_AMBULATORY_CARE_PROVIDER_SITE_OTHER): Payer: Medicare Other | Admitting: Family Medicine

## 2014-05-13 ENCOUNTER — Encounter: Payer: Self-pay | Admitting: Family Medicine

## 2014-05-13 VITALS — BP 140/79 | HR 50 | Temp 97.5°F | Ht 70.0 in | Wt 158.2 lb

## 2014-05-13 DIAGNOSIS — I2583 Coronary atherosclerosis due to lipid rich plaque: Secondary | ICD-10-CM

## 2014-05-13 DIAGNOSIS — R0789 Other chest pain: Secondary | ICD-10-CM | POA: Diagnosis not present

## 2014-05-13 DIAGNOSIS — E039 Hypothyroidism, unspecified: Secondary | ICD-10-CM

## 2014-05-13 DIAGNOSIS — C44319 Basal cell carcinoma of skin of other parts of face: Secondary | ICD-10-CM | POA: Diagnosis not present

## 2014-05-13 DIAGNOSIS — R1010 Upper abdominal pain, unspecified: Secondary | ICD-10-CM | POA: Diagnosis not present

## 2014-05-13 DIAGNOSIS — R194 Change in bowel habit: Secondary | ICD-10-CM

## 2014-05-13 DIAGNOSIS — R1013 Epigastric pain: Secondary | ICD-10-CM | POA: Diagnosis not present

## 2014-05-13 DIAGNOSIS — K219 Gastro-esophageal reflux disease without esophagitis: Secondary | ICD-10-CM

## 2014-05-13 DIAGNOSIS — I2510 Atherosclerotic heart disease of native coronary artery without angina pectoris: Secondary | ICD-10-CM | POA: Diagnosis not present

## 2014-05-13 DIAGNOSIS — K5900 Constipation, unspecified: Secondary | ICD-10-CM

## 2014-05-13 DIAGNOSIS — F419 Anxiety disorder, unspecified: Secondary | ICD-10-CM

## 2014-05-13 DIAGNOSIS — F411 Generalized anxiety disorder: Secondary | ICD-10-CM

## 2014-05-13 DIAGNOSIS — F329 Major depressive disorder, single episode, unspecified: Secondary | ICD-10-CM

## 2014-05-13 DIAGNOSIS — F32A Depression, unspecified: Secondary | ICD-10-CM

## 2014-05-13 DIAGNOSIS — F418 Other specified anxiety disorders: Secondary | ICD-10-CM | POA: Diagnosis not present

## 2014-05-13 MED ORDER — SUCRALFATE 1 GM/10ML PO SUSP: 1.0000 g | Freq: Three times a day (TID) | ORAL | Status: DC

## 2014-05-13 MED ORDER — LORAZEPAM 0.5 MG PO TABS: 0.5000 mg | ORAL_TABLET | Freq: Two times a day (BID) | ORAL | Status: DC | PRN

## 2014-05-13 MED ORDER — DEXLANSOPRAZOLE 60 MG PO CPDR: 60.0000 mg | DELAYED_RELEASE_CAPSULE | Freq: Every day | ORAL | Status: DC

## 2014-05-13 NOTE — Progress Notes (Signed)
Pre visit review using our clinic review tool, if applicable. No additional management support is needed unless otherwise documented below in the visit note. 

## 2014-05-13 NOTE — Patient Instructions (Signed)

## 2014-05-14 ENCOUNTER — Other Ambulatory Visit: Payer: Self-pay | Admitting: Family Medicine

## 2014-05-14 DIAGNOSIS — R1013 Epigastric pain: Secondary | ICD-10-CM

## 2014-05-14 DIAGNOSIS — R1010 Upper abdominal pain, unspecified: Secondary | ICD-10-CM

## 2014-05-14 LAB — RENAL FUNCTION PANEL
Albumin: 4.3 g/dL (ref 3.5–5.2)
BUN: 12 mg/dL (ref 6–23)
CO2: 25 meq/L (ref 19–32)
Calcium: 9.3 mg/dL (ref 8.4–10.5)
Chloride: 100 meq/L (ref 96–112)
Creatinine, Ser: 1 mg/dL (ref 0.4–1.5)
GFR: 76.66 mL/min
Glucose, Bld: 80 mg/dL (ref 70–99)
Phosphorus: 3.7 mg/dL (ref 2.3–4.6)
Potassium: 4.4 meq/L (ref 3.5–5.1)
Sodium: 136 meq/L (ref 135–145)

## 2014-05-14 LAB — CBC
HCT: 43.4 % (ref 39.0–52.0)
Hemoglobin: 14.5 g/dL (ref 13.0–17.0)
MCHC: 33.4 g/dL (ref 30.0–36.0)
MCV: 94.9 fl (ref 78.0–100.0)
Platelets: 224 K/uL (ref 150.0–400.0)
RBC: 4.57 Mil/uL (ref 4.22–5.81)
RDW: 12.9 % (ref 11.5–15.5)
WBC: 6.7 K/uL (ref 4.0–10.5)

## 2014-05-14 LAB — TSH: TSH: 2.33 u[IU]/mL (ref 0.35–4.50)

## 2014-05-14 LAB — H. PYLORI ANTIBODY, IGG: H Pylori IgG: NEGATIVE

## 2014-05-18 ENCOUNTER — Encounter: Payer: Self-pay | Admitting: Family Medicine

## 2014-05-18 DIAGNOSIS — F411 Generalized anxiety disorder: Secondary | ICD-10-CM

## 2014-05-18 HISTORY — DX: Generalized anxiety disorder: F41.1

## 2014-05-18 NOTE — Assessment & Plan Note (Signed)
Encouraged increased hydration and fiber in diet. Daily probiotics. If bowels not moving can use MOM 2 tbls po in 4 oz of warm prune juice by mouth every 2-3 days. If no results then repeat in 4 hours with  Dulcolax suppository pr, may repeat again in 4 more hours as needed. Seek care if symptoms worsen. Consider daily Miralax and/or Dulcolax if symptoms persist.  

## 2014-05-18 NOTE — Assessment & Plan Note (Signed)
On Levothyroxine, continue to monitor 

## 2014-05-18 NOTE — Assessment & Plan Note (Addendum)
Avoid offending foods, start probiotics. Do not eat large meals in late evening and consider raising head of bed.  Continue protonix and add carafate check ifob. h pylori negative. If no improvement then will need imaging and referral
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
## 2014-05-19 NOTE — Assessment & Plan Note (Signed)
Continue Remeron and Lorazepam for now but may need to consider an SSRI

## 2014-05-20 ENCOUNTER — Ambulatory Visit (HOSPITAL_BASED_OUTPATIENT_CLINIC_OR_DEPARTMENT_OTHER): Payer: Medicare Other

## 2014-05-20 ENCOUNTER — Other Ambulatory Visit: Payer: Self-pay | Admitting: Family Medicine

## 2014-05-20 ENCOUNTER — Ambulatory Visit (HOSPITAL_BASED_OUTPATIENT_CLINIC_OR_DEPARTMENT_OTHER)
Admission: RE | Admit: 2014-05-20 | Discharge: 2014-05-20 | Disposition: A | Discharge status: Home / Self Care | Payer: Medicare Other | Source: Ambulatory Visit | Attending: Family Medicine | Admitting: Family Medicine

## 2014-05-20 ENCOUNTER — Other Ambulatory Visit (INDEPENDENT_AMBULATORY_CARE_PROVIDER_SITE_OTHER): Payer: Medicare Other

## 2014-05-20 ENCOUNTER — Telehealth: Payer: Self-pay

## 2014-05-20 ENCOUNTER — Other Ambulatory Visit: Payer: Self-pay

## 2014-05-20 DIAGNOSIS — R1013 Epigastric pain: Secondary | ICD-10-CM

## 2014-05-20 DIAGNOSIS — R194 Change in bowel habit: Secondary | ICD-10-CM

## 2014-05-20 DIAGNOSIS — R1084 Generalized abdominal pain: Secondary | ICD-10-CM

## 2014-05-20 DIAGNOSIS — R195 Other fecal abnormalities: Secondary | ICD-10-CM

## 2014-05-20 DIAGNOSIS — Z1211 Encounter for screening for malignant neoplasm of colon: Secondary | ICD-10-CM

## 2014-05-20 LAB — FECAL OCCULT BLOOD, IMMUNOCHEMICAL: FECAL OCCULT BLD: POSITIVE — AB

## 2014-05-20 NOTE — Telephone Encounter (Addendum)
Patient states that you can choose whom ever you recommend for GI. Reviewed [place] results with patient.  Explained directions to get on MyChart.

## 2014-05-20 NOTE — Telephone Encounter (Addendum)
Shanon Becvar [phone]  Lissa Merlin returned your call and stated that he does not have a GI doctor here, he last seen one in [place]. Jazen does not know how to get into his mychart

## 2014-05-21 DIAGNOSIS — R1013 Epigastric pain: Secondary | ICD-10-CM | POA: Diagnosis not present

## 2014-05-21 DIAGNOSIS — Z800 Family history of malignant neoplasm of digestive organs: Secondary | ICD-10-CM | POA: Diagnosis not present

## 2014-05-21 DIAGNOSIS — R195 Other fecal abnormalities: Secondary | ICD-10-CM | POA: Diagnosis not present

## 2014-05-23 ENCOUNTER — Other Ambulatory Visit: Payer: Self-pay

## 2014-05-23 MED ORDER — DUTASTERIDE-TAMSULOSIN HCL 0.5-0.4 MG PO CAPS: 1.0000 | ORAL_CAPSULE | Freq: Every day | ORAL | Status: DC

## 2014-05-29 ENCOUNTER — Encounter (HOSPITAL_COMMUNITY): Payer: Self-pay | Admitting: Cardiovascular Disease

## 2014-05-29 ENCOUNTER — Other Ambulatory Visit: Payer: Self-pay | Admitting: Family Medicine

## 2014-05-29 DIAGNOSIS — R195 Other fecal abnormalities: Secondary | ICD-10-CM | POA: Diagnosis not present

## 2014-05-29 DIAGNOSIS — R1013 Epigastric pain: Secondary | ICD-10-CM | POA: Diagnosis not present

## 2014-06-06 ENCOUNTER — Ambulatory Visit (HOSPITAL_BASED_OUTPATIENT_CLINIC_OR_DEPARTMENT_OTHER)
Admission: RE | Admit: 2014-06-06 | Discharge: 2014-06-06 | Disposition: A | Discharge status: Home / Self Care | Payer: Medicare Other | Source: Ambulatory Visit | Attending: Family Medicine | Admitting: Family Medicine

## 2014-06-06 ENCOUNTER — Ambulatory Visit (INDEPENDENT_AMBULATORY_CARE_PROVIDER_SITE_OTHER): Payer: Medicare Other | Admitting: Family Medicine

## 2014-06-06 ENCOUNTER — Encounter: Payer: Self-pay | Admitting: Family Medicine

## 2014-06-06 VITALS — BP 132/78 | HR 49 | Temp 97.9°F | Wt 158.6 lb

## 2014-06-06 DIAGNOSIS — M47816 Spondylosis without myelopathy or radiculopathy, lumbar region: Secondary | ICD-10-CM | POA: Diagnosis not present

## 2014-06-06 DIAGNOSIS — G4700 Insomnia, unspecified: Secondary | ICD-10-CM

## 2014-06-06 DIAGNOSIS — E039 Hypothyroidism, unspecified: Secondary | ICD-10-CM | POA: Diagnosis not present

## 2014-06-06 DIAGNOSIS — M545 Low back pain: Secondary | ICD-10-CM | POA: Insufficient documentation

## 2014-06-06 DIAGNOSIS — M479 Spondylosis, unspecified: Secondary | ICD-10-CM | POA: Insufficient documentation

## 2014-06-06 DIAGNOSIS — M546 Pain in thoracic spine: Secondary | ICD-10-CM | POA: Diagnosis not present

## 2014-06-06 DIAGNOSIS — I2510 Atherosclerotic heart disease of native coronary artery without angina pectoris: Secondary | ICD-10-CM | POA: Diagnosis not present

## 2014-06-06 DIAGNOSIS — E785 Hyperlipidemia, unspecified: Secondary | ICD-10-CM

## 2014-06-06 DIAGNOSIS — K219 Gastro-esophageal reflux disease without esophagitis: Secondary | ICD-10-CM

## 2014-06-06 DIAGNOSIS — C4491 Basal cell carcinoma of skin, unspecified: Secondary | ICD-10-CM

## 2014-06-06 NOTE — Patient Instructions (Addendum)
Consider a probiotic daily such as Digestive Advantage or Phillip's Colon   Seborrheic Keratosis Seborrheic keratosis is a common, noncancerous (benign) skin growth that can occur anywhere on the skin.It looks like "stuck-on," waxy, rough, tan, brown, or black spots on the skin. These skin growths can be flat or raised.They are often called "barnacles" because of their pasted-on appearance.Usually, these skin growths appear in adulthood, around age 74, and increase in number as you age. They may also develop during pregnancy or following estrogen therapy. Many people may only have one growth appear in their lifetime, while some people may develop many growths. CAUSES It is unknown what causes these skin growths, but they appear to run in families. SYMPTOMS Seborrheic keratosis is often located on the face, chest, shoulders, back, or other areas. These growths are:  Usually painless, but may become irritated and itchy.  Yellow, brown, black, or other colors.  Slightly raised or have a flat surface.  Sometimes rough or wart-like in texture.  Often waxy on the surface.  Round or oval-shaped.  Sometimes "stuck-on" in appearance.  Sometimes single, but there are usually many growths. Any growth that bleeds, itches on a regular basis, becomes inflamed, or becomes irritated needs to be evaluated by a skin specialist (dermatologist). DIAGNOSIS Diagnosis is mainly based on the way the growths appear. In some cases, it can be difficult to tell this type of skin growth from skin cancer. A skin growth tissue sample (biopsy) may be used to confirm the diagnosis. TREATMENT Most often, treatment is not needed because the skin growths are benign.If the skin growth is irritated easily by clothing or jewelry, causing it to scab or bleed, treatment may be recommended. Patients may also choose to have the growths removed because they do not like their appearance. Most commonly, these growths are  treated with cryosurgery. In cryosurgery, liquid nitrogen is applied to "freeze" the growth. The growth usually falls off within a matter of days. A blister may form and dry into a scab that will also fall off. After the growth or scab falls off, it may leave a dark or light spot on the skin. This color may fade over time, or it may remain permanent on the skin. HOME CARE INSTRUCTIONS If the skin growths are treated with cryosurgery, the treated area needs to be kept clean with water and soap. SEEK MEDICAL CARE IF:  You have questions about these growths or other skin problems.  You develop new symptoms, including:  A change in the appearance of the skin growth.  New growths.  Any bleeding, itching, or pain in the growths.  A skin growth that looks similar to seborrheic keratosis. Document Released: 07/09/2010 Document Revised: 08/29/2011 Document Reviewed: 07/09/2010 [facility] Patient Information 2015 [place], [place]. This information is not intended to replace advice given to you by your health care provider. Make sure you discuss any questions you have with your health care provider.

## 2014-06-06 NOTE — Progress Notes (Signed)
Pre visit review using our clinic review tool, if applicable. No additional management support is needed unless otherwise documented below in the visit note. 

## 2014-06-09 ENCOUNTER — Encounter: Payer: Self-pay | Admitting: Family Medicine

## 2014-06-09 DIAGNOSIS — C4491 Basal cell carcinoma of skin, unspecified: Secondary | ICD-10-CM

## 2014-06-09 HISTORY — DX: Basal cell carcinoma of skin, unspecified: C44.91

## 2014-06-09 NOTE — Assessment & Plan Note (Signed)
Tolerating statin, encouraged heart healthy diet, avoid trans fats, minimize simple carbs and saturated fats. Increase exercise as tolerated 

## 2014-06-09 NOTE — Assessment & Plan Note (Signed)
Left temple. Has had it removed by dermatology

## 2014-06-09 NOTE — Assessment & Plan Note (Signed)
Encouraged good sleep hygiene such as dark, quiet room. No blue/green glowing lights such as computer screens in bedroom. No alcohol or stimulants in evening. Cut down on caffeine as able. Regular exercise is helpful but not just prior to bed time.  

## 2014-06-09 NOTE — Assessment & Plan Note (Signed)
Avoid offending foods, start probiotics. Do not eat large meals in late evening and consider raising head of bed.  

## 2014-06-09 NOTE — Progress Notes (Signed)
James [name]  [id number] May 25, 1941 06/09/2014      Progress Note-Follow Up  Subjective  Chief Complaint  Chief Complaint  Patient presents with  . Follow-up    4 weeks    HPI  Patient is a 73 y.o. male in today for routine medical care. Patient is in for follow-up. He notes with increased fluid and fiber bowels are moving comfortably most days without trouble. Continues to struggle with some mid back pain which radiates around to the front at times. Denies dyspepsia. No recent illness. Denies CP/palp/SOB/HA/congestion/fevers/GI or GU c/o. Taking meds as prescribed  Past Medical History  Diagnosis Date  . CAD (coronary artery disease)     nonobstructive by cath 2008; nonobstructive by Cardiac CT 11/2011  . Hypothyroidism   . Atrial fibrillation   . Diverticulosis   . Mitral valve prolapse   . HLD (hyperlipidemia)   . GERD (gastroesophageal reflux disease)   . Hiatal hernia   . Cervical spine fracture 1980    C6-7  . 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 History of squamous cell carcinoma 10-05-10    Dr [name], [place]  . 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  . Syncope 02/03/2013  . Benign paroxysmal positional vertigo 04/06/2013  . History of atrial fibrillation   . AAA (abdominal aortic aneurysm) without rupture 09/08/2013  . Erectile dysfunction 09/08/2013  . Preventative health care 03/16/2014  . Medicare annual wellness visit, subsequent 03/16/2014    Sees [facility] Follows with Dr Johnsie Cancel of cardiology Sees [facility] for colonoscopy, last colonoscopy in 2013   . Anxiety state 05/18/2014  . 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 Appendectomy  05/2011  . Inguinal hernia repair  07/2010    right  . Knee arthroplasty      bilat  . Cardiac catheterization  01/03/12    30% ostial LAD stenosis, ostial 30-40% D1 stenosis, smooth and normal left main and RCA; LVEF 65%    . Tonsillectomy  1947  . Tumor removal  1958    "fatty tumor"  . Left heart catheterization with coronary angiogram N/A 01/03/2012    Procedure: LEFT HEART CATHETERIZATION WITH CORONARY ANGIOGRAM;  Surgeon: [name], MD;  Location: [facility] CATH LAB;  Service: Cardiovascular;  Laterality: N/A;    Family History  Problem Relation Age of Onset  . Heart disease Mother     MI 25s  . Stroke Mother   . Hypertension Mother   . Heart disease Maternal Grandfather     MI 70-80s  . Heart disease Maternal Grandmother     MI 70-80s  . Lung disease Father     penumonitis  . Cancer Sister     colon  . Cancer Paternal Uncle     GI: colon he thinks    History   Social History  . Marital Status: Single    Spouse Name: N/A    Number of Children: N/A  . Years of Education: N/A   Occupational History  . Chief Financial Officer     Retired   Social History Main Topics  . Smoking status: Never Smoker   . Smokeless tobacco: Not on file  . Alcohol Use: Yes     Comment: 2 glasses red wine/day  . Drug Use: No  . Sexual Activity: Yes   Other Topics Concern  . Not on file   Social History Narrative  Current Outpatient Prescriptions on File Prior to Visit  Medication Sig Dispense Refill  . atorvastatin (LIPITOR) 20 MG tablet Take 1 tablet (20 mg total) by mouth daily. 90 tablet 3  . Calcium Carb-Cholecalciferol (CALCIUM 1000 + D PO) Take 1 tablet by mouth daily.    Marland Kitchen dexlansoprazole (DEXILANT) 60 MG capsule Take 1 capsule (60 mg total) by mouth daily. 30 capsule 3  . Dutasteride-Tamsulosin HCl (JALYN) 0.5-0.4 MG CAPS Take 1 capsule by mouth at bedtime. 90 capsule 0  . levothyroxine (SYNTHROID, LEVOTHROID) 100 MCG tablet TAKE 1 TABLET (100 MCG TOTAL) BY MOUTH DAILY BEFORE BREAKFAST. 30 tablet 2  . LORazepam (ATIVAN) 0.5 MG tablet Take 1 tablet (0.5 mg total) by mouth 2 (two) times daily as needed for anxiety. 30 tablet 1  . meclizine (ANTIVERT) 25 MG tablet Take 1 tablet (25 mg total) by mouth 2  (two) times daily as needed for dizziness or nausea. 30 tablet 2  . MELATONIN PO Take by mouth at bedtime as needed.    . mirtazapine (REMERON) 15 MG tablet Take 1 tablet (15 mg total) by mouth at bedtime. 90 tablet 1  . Multiple Vitamins-Minerals (MULTIVITAMINS THER. W/MINERALS) TABS Take 1 tablet by mouth daily.    . sildenafil (VIAGRA) 100 MG tablet Take 1 tablet (100 mg total) by mouth daily as needed for erectile dysfunction. 10 tablet 5  . Testosterone (AXIRON) 30 MG/ACT SOLN Apply 1 application topically every morning. Under each arm 90 mL 1  . zolpidem (AMBIEN) 10 MG tablet Take 1 tablet (10 mg total) by mouth at bedtime as needed for sleep. 30 tablet 5  . aspirin 81 MG tablet Take 81 mg by mouth daily.    . nitroGLYCERIN (NITROSTAT) 0.4 MG SL tablet Place 1 tablet (0.4 mg total) under the tongue every 5 (five) minutes x 3 doses as needed for chest pain. 25 tablet 3   No current facility-administered medications on file prior to visit.    Allergies  Allergen Reactions  . Sulfa Antibiotics Other (See Comments)    Childhood allergy    Review of Systems  Review of Systems  Constitutional: Negative for fever and malaise/fatigue.  HENT: Negative for congestion.   Eyes: Negative for discharge.  Respiratory: Negative for shortness of breath.   Cardiovascular: Negative for chest pain, palpitations and leg swelling.  Gastrointestinal: Negative for nausea, abdominal pain and diarrhea.  Genitourinary: Negative for dysuria.  Musculoskeletal: Negative for falls.  Skin: Negative for rash.  Neurological: Negative for loss of consciousness and headaches.  Endo/Heme/Allergies: Negative for polydipsia.  Psychiatric/Behavioral: Negative for depression and suicidal ideas. The patient is not nervous/anxious and does not have insomnia.     Objective  BP 132/78 mmHg  Pulse 49  Temp(Src) 97.9 F (36.6 C) (Oral)  Wt 158 lb 9.6 oz (71.94 kg)  SpO2 100%  Physical Exam  Physical Exam    Constitutional: He is oriented to person, place, and time and well-developed, well-nourished, and in no distress. No distress.  HENT:  Head: Normocephalic and atraumatic.  Eyes: Conjunctivae are normal.  Neck: Neck supple. No thyromegaly present.  Cardiovascular: Normal rate, regular rhythm and normal heart sounds.   No murmur heard. Pulmonary/Chest: Effort normal and breath sounds normal. No respiratory distress.  Abdominal: He exhibits no distension and no mass. There is no tenderness.  Musculoskeletal: He exhibits no edema.  Neurological: He is alert and oriented to person, place, and time.  Skin: Skin is warm.  Psychiatric: Memory, affect and judgment  normal.    Lab Results  Component Value Date   TSH 2.33 05/13/2014   Lab Results  Component Value Date   WBC 6.7 05/13/2014   HGB 14.5 05/13/2014   HCT 43.4 05/13/2014   MCV 94.9 05/13/2014   PLT 224.0 05/13/2014   Lab Results  Component Value Date   CREATININE 1.0 05/13/2014   BUN 12 05/13/2014   NA 136 05/13/2014   K 4.4 05/13/2014   CL 100 05/13/2014   CO2 25 05/13/2014   Lab Results  Component Value Date   ALT 19 01/17/2014   AST 23 01/17/2014   ALKPHOS 58 01/17/2014   BILITOT 1.1 01/17/2014   Lab Results  Component Value Date   CHOL 141 01/17/2014   Lab Results  Component Value Date   HDL 63.30 01/17/2014   Lab Results  Component Value Date   LDLCALC 64 01/17/2014   Lab Results  Component Value Date   TRIG 68.0 01/17/2014   Lab Results  Component Value Date   CHOLHDL 2 01/17/2014     Assessment & Plan  GERD (gastroesophageal reflux disease) Avoid offending foods, start probiotics. Do not eat large meals in late evening and consider raising head of bed.   Hypothyroidism On Levothyroxine, continue to monitor  Hyperlipidemia Tolerating statin, encouraged heart healthy diet, avoid trans fats, minimize simple carbs and saturated fats. Increase exercise as tolerated  Insomnia Encouraged  good sleep hygiene such as dark, quiet room. No blue/green glowing lights such as computer screens in bedroom. No alcohol or stimulants in evening. Cut down on caffeine as able. Regular exercise is helpful but not just prior to bed time.   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 Has had it removed by dermatology

## 2014-06-09 NOTE — Assessment & Plan Note (Signed)
On Levothyroxine, continue to monitor 

## 2014-06-25 ENCOUNTER — Other Ambulatory Visit: Payer: Self-pay | Admitting: Gastroenterology

## 2014-06-25 DIAGNOSIS — R1013 Epigastric pain: Secondary | ICD-10-CM | POA: Diagnosis not present

## 2014-07-02 ENCOUNTER — Telehealth: Payer: Self-pay | Admitting: Family Medicine

## 2014-07-02 NOTE — Telephone Encounter (Signed)
Caller name:[name], James Lina Relation to pt: self  Call back number: [phone] Pharmacy:  [facility], [place] - [address] [phone] (Phone)     Reason for call:  Pt requesting a refill dexlansoprazole (DEXILANT) 60 MG capsule

## 2014-07-02 NOTE — Telephone Encounter (Signed)
Pt states that he has been off of this medication for "quite some time now."   States he has been to the pharmacy several times requesting a refill, but was told that pharmacy was unable to dispense medication.  Called pharmacy to inquire about medication.  Pharm staff stated that insurance will not pay for medication and that physician needed to call [phone].  Called number.  A prior authorization was required. Completed PA.  Medication was approved from May 03, 2014-July 02, 2015.  Called and notified patient.  No further needs at this time.

## 2014-07-07 ENCOUNTER — Ambulatory Visit (HOSPITAL_COMMUNITY)
Admission: RE | Admit: 2014-07-07 | Discharge: 2014-07-07 | Disposition: A | Discharge status: Home / Self Care | Payer: Medicare Other | Source: Ambulatory Visit | Attending: Gastroenterology | Admitting: Gastroenterology

## 2014-07-07 DIAGNOSIS — K828 Other specified diseases of gallbladder: Secondary | ICD-10-CM | POA: Diagnosis not present

## 2014-07-07 DIAGNOSIS — D71 Functional disorders of polymorphonuclear neutrophils: Secondary | ICD-10-CM | POA: Insufficient documentation

## 2014-07-07 DIAGNOSIS — I77811 Abdominal aortic ectasia: Secondary | ICD-10-CM | POA: Diagnosis not present

## 2014-07-07 DIAGNOSIS — R1013 Epigastric pain: Secondary | ICD-10-CM

## 2014-07-10 ENCOUNTER — Telehealth: Payer: Self-pay

## 2014-07-10 DIAGNOSIS — F419 Anxiety disorder, unspecified: Principal | ICD-10-CM

## 2014-07-10 DIAGNOSIS — F329 Major depressive disorder, single episode, unspecified: Secondary | ICD-10-CM

## 2014-07-10 DIAGNOSIS — F32A Depression, unspecified: Secondary | ICD-10-CM

## 2014-07-10 MED ORDER — LORAZEPAM 0.5 MG PO TABS: 0.5000 mg | ORAL_TABLET | Freq: Two times a day (BID) | ORAL | Status: DC | PRN

## 2014-07-10 NOTE — Telephone Encounter (Signed)
Rx printed awaiting Dr. Etter Sjogren signature in Dr. Rhae Lerner absence.

## 2014-07-10 NOTE — Telephone Encounter (Signed)
Pt is requesting refill on Lorazepam.  Last OV: 06/06/2014 Last Fill: 05/13/2014 # [phone] 1 RF  Please advise.

## 2014-07-10 NOTE — Telephone Encounter (Signed)
Faxed to [facility] pharmacy.

## 2014-07-10 NOTE — Telephone Encounter (Signed)
Refill x1 

## 2014-07-18 ENCOUNTER — Telehealth: Payer: Self-pay | Admitting: *Deleted

## 2014-07-18 ENCOUNTER — Other Ambulatory Visit: Payer: Self-pay | Admitting: Family Medicine

## 2014-07-18 DIAGNOSIS — K828 Other specified diseases of gallbladder: Secondary | ICD-10-CM

## 2014-07-18 NOTE — Telephone Encounter (Signed)
Called and spoke with the pt and informed him of the recent US Abdomen results.  Pt verbalized understanding.  Asked the pt how he is doing and he stated he is feeling a little better,but still feeling a little discomfort and other issues.  Pt stated that he received a called from Dr. Benson Norway and he felt he needed to see a surgeon.  Pt said he has not heard back from Dr. Ulyses Amor office.  He would like it if we could place the referral to the surgeon.//AB/CMA

## 2014-07-22 ENCOUNTER — Ambulatory Visit: Payer: Medicare Other | Admitting: Gastroenterology

## 2014-07-29 ENCOUNTER — Telehealth: Payer: Self-pay | Admitting: *Deleted

## 2014-07-29 ENCOUNTER — Other Ambulatory Visit: Payer: Self-pay | Admitting: Family Medicine

## 2014-07-29 DIAGNOSIS — G4700 Insomnia, unspecified: Secondary | ICD-10-CM

## 2014-07-29 MED ORDER — TESTOSTERONE 30 MG/ACT TD SOLN: 1.0000 "application " | Freq: Every morning | TRANSDERMAL | Status: DC

## 2014-07-29 MED ORDER — ZOLPIDEM TARTRATE 10 MG PO TABS: 10.0000 mg | ORAL_TABLET | Freq: Every evening | ORAL | Status: DC | PRN

## 2014-07-29 NOTE — Telephone Encounter (Signed)
Faxed hardcopy for both Testosterone and zolpidem to [facility].

## 2014-07-29 NOTE — Telephone Encounter (Signed)
Prior authorization for Axiron initiated. Awaiting determination. JG//CMA

## 2014-08-06 ENCOUNTER — Encounter (INDEPENDENT_AMBULATORY_CARE_PROVIDER_SITE_OTHER): Payer: Self-pay | Admitting: General Surgery

## 2014-08-06 DIAGNOSIS — K8020 Calculus of gallbladder without cholecystitis without obstruction: Secondary | ICD-10-CM | POA: Diagnosis not present

## 2014-08-06 NOTE — Progress Notes (Signed)
Patient ID: James GHEEN, male   DOB: 02/16/1941, 74 y.o.   MRN: [id number]  James [name] 08/06/2014 9:57 AM Location: [facility] Patient #: [id number] DOB: [birth year] Divorced / Language: James [name] / Race: White Male History of Present Illness James Hollingshead MD; 08/06/2014 10:32 AM) The patient is a 74 year old male who presents for evaluation of gallbladder disease.  Note:He is referred by Dr. Benson Norway because of post- prandialRUQ abdominal pain, bloating. Recently, he ate some fried fish and he had a classic episode of epigastric right upper quadrant pain rating through to his back associated with nausea. EGD was negative. Ultrasound demonstrated 2 small liver lesions as well as gallbladder sludge. Because of his biliary colic type symptoms and gallbladder sludge he's been sent here to discuss cholecystectomy. He has a family history of gallbladder disease. Liver function tests are normal. Symptoms are not relieved by proton pump inhibitor.  Other Problems Jeralyn Ruths, [place]; 08/06/2014 9:57 AM) Cancer Gastroesophageal Reflux Disease Heart murmur Inguinal Hernia Thyroid Disease  Past Surgical History Jeralyn Ruths, CMA; 08/06/2014 9:57 AM) Appendectomy Colon Polyp Removal - Colonoscopy Knee Surgery Bilateral. Laparoscopic Inguinal Hernia Surgery Right. Oral Surgery Tonsillectomy Vasectomy  Allergies Jeralyn Ruths, CMA; 08/06/2014 10:06 AM) Sulfabenzamide *CHEMICALS*  Medication History Jeralyn Ruths, CMA; 08/06/2014 10:05 AM) Aspirin (81MG  Tablet, 1 Oral daily) Active. Calcium Carbonate Antacid (1000MG  Tablet, Oral) Active. Calcium Carb-Cholecalciferol (1000-800MG -UNIT Tablet, 1 Oral daily) Active. (With Vitamin D) Dexilant (60MG  Capsule DR, 1 Oral daily) Active. Jalyn (0.5-0.4MG  Capsule, 1 Oral qhs) Active. Levothyroxine Sodium (100MCG Tablet, 1 Oral daily) Active. Ativan (0.5MG  Tablet, Oral prn) Active. Remeron (15MG  Tablet, 1 Oral  daily) Active. Multivitamin (1 Oral daily) Active. (With minerals) Nitroglycerin (0.4MG  Tab Sublingual, 1 Sublingual prn) Active. Viagra (100MG  Tablet, Oral prn) Active. Axiron (30MG /ACT Solution, 1 Transdermal daily) Active. Ambien (10MG  Tablet, 1 Oral qhs) Active.  Social History Jeralyn Ruths, [place]; 08/06/2014 9:57 AM) Alcohol use Moderate alcohol use. Caffeine use Coffee. No drug use Tobacco use Never smoker.  Family History Jeralyn Ruths, [place]; 08/06/2014 9:57 AM) Colon Cancer Family Members In General, Sister. Heart Disease Mother. Thyroid problems Daughter, Mother.     Review of Systems Jearld Fenton Morris CMA; 08/06/2014 9:58 AM) General Not Present- Appetite Loss, Chills, Fatigue, Fever, Night Sweats, Weight Gain and Weight Loss. Skin Not Present- Change in Wart/Mole, Dryness, Hives, Jaundice, New Lesions, Non-Healing Wounds, Rash and Ulcer. HEENT Not Present- Earache, Hearing Loss, Hoarseness, Nose Bleed, Oral Ulcers, Ringing in the Ears, Seasonal Allergies, Sinus Pain, Sore Throat, Visual Disturbances, Wears glasses/contact lenses and Yellow Eyes. Respiratory Not Present- Bloody sputum, Chronic Cough, Difficulty Breathing, Snoring and Wheezing. Breast Not Present- Breast Mass, Breast Pain, Nipple Discharge and Skin Changes. Cardiovascular Not Present- Chest Pain, Difficulty Breathing Lying Down, Leg Cramps, Palpitations, Rapid Heart Rate, Shortness of Breath and Swelling of Extremities. Gastrointestinal Present- Bloating and Excessive gas. Not Present- Abdominal Pain, Bloody Stool, Change in Bowel Habits, Chronic diarrhea, Constipation, Difficulty Swallowing, Gets full quickly at meals, Hemorrhoids, Indigestion, Nausea, Rectal Pain and Vomiting. Male Genitourinary Not Present- Blood in Urine, Change in Urinary Stream, Frequency, Impotence, Nocturia, Painful Urination, Urgency and Urine Leakage. Musculoskeletal Not Present- Back Pain, Joint Pain, Joint Stiffness, Muscle  Pain, Muscle Weakness and Swelling of Extremities. Neurological Not Present- Decreased Memory, Fainting, Headaches, Numbness, Seizures, Tingling, Tremor, Trouble walking and Weakness. Psychiatric Not Present- Anxiety, Bipolar, Change in Sleep Pattern, Depression, Fearful and Frequent crying. Endocrine Not Present- Cold Intolerance, Excessive Hunger, Hair Changes, Heat Intolerance, Hot flashes  and New Diabetes. Hematology Not Present- Easy Bruising, Excessive bleeding, Gland problems, HIV and Persistent Infections.  Vitals Jearld Fenton Morris CMA; 08/06/2014 10:08 AM) 08/06/2014 10:07 AM Weight: 156.45 lb Temp.: 97.81F(Oral)  Pulse: 60 (Regular)  Resp.: 18 (Unlabored)  BP: 118/68 (Sitting, Left Arm, Standard)     Physical Exam James Hollingshead MD; 08/06/2014 1:19 PM)  The physical exam findings are as follows: Note:General: WDWN in NAD. Pleasant and cooperative.  HEENT: [place]/AT, no facial masses  EYES: EOMI, no icterus  CV: RRR, no murmur, no JVD.  ABDOMEN: Soft, nontender, nondistended, no masses, no organomegaly, active bowel sounds, no scars, no hernias.  SKIN: No jaundice or suspicious rashes.  NEUROLOGIC: Alert and oriented, answers questions appropriately, normal gait and station.  PSYCHIATRIC: Normal mood, affect , and behavior.    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 He has gallbladder sludge which could be microlithiasis as well as a family history of gallbladder disease.  Plan: We discussed laparoscopic cholecystectomy with cholangiogram versus observation and strict low-fat diet. We went over the procedure, risks, and success rate of the cholecystectomy. I have explained the procedure, risks, and aftercare of cholecystectomy. Risks include but are not limited to bleeding, infection, wound problems,  anesthesia, diarrhea, bile leak, injury to common bile duct/liver/intestine. He seems to understand and would like to proceed. All questions were answered.  Current Plans Free Text Instructions : discussed with patient and provided information. Schedule for Surgery  [name], MD

## 2014-08-08 ENCOUNTER — Other Ambulatory Visit: Payer: Self-pay | Admitting: Family Medicine

## 2014-08-10 MED ORDER — MIRTAZAPINE 15 MG PO TABS: 15.0000 mg | ORAL_TABLET | Freq: Every day | ORAL | Status: DC

## 2014-08-11 ENCOUNTER — Other Ambulatory Visit: Payer: Self-pay | Admitting: *Deleted

## 2014-08-11 DIAGNOSIS — I10 Essential (primary) hypertension: Secondary | ICD-10-CM

## 2014-08-11 DIAGNOSIS — E785 Hyperlipidemia, unspecified: Secondary | ICD-10-CM

## 2014-08-11 MED ORDER — ATORVASTATIN CALCIUM 20 MG PO TABS: 20.0000 mg | ORAL_TABLET | Freq: Every day | ORAL | Status: DC

## 2014-08-19 ENCOUNTER — Other Ambulatory Visit: Payer: Self-pay | Admitting: Family Medicine

## 2014-08-19 DIAGNOSIS — F329 Major depressive disorder, single episode, unspecified: Secondary | ICD-10-CM

## 2014-08-19 DIAGNOSIS — F32A Depression, unspecified: Secondary | ICD-10-CM

## 2014-08-19 DIAGNOSIS — F419 Anxiety disorder, unspecified: Principal | ICD-10-CM

## 2014-08-19 MED ORDER — LORAZEPAM 0.5 MG PO TABS: 0.5000 mg | ORAL_TABLET | Freq: Two times a day (BID) | ORAL | Status: DC | PRN

## 2014-08-19 NOTE — Telephone Encounter (Signed)
Faxed hardcopy for Lorazepam to [facility].

## 2014-08-21 ENCOUNTER — Encounter (HOSPITAL_COMMUNITY): Payer: Self-pay

## 2014-08-21 ENCOUNTER — Encounter (HOSPITAL_COMMUNITY)
Admission: RE | Admit: 2014-08-21 | Discharge: 2014-08-21 | Disposition: A | Discharge status: Home / Self Care | Payer: Medicare Other | Source: Ambulatory Visit | Attending: General Surgery | Admitting: General Surgery

## 2014-08-21 DIAGNOSIS — K8020 Calculus of gallbladder without cholecystitis without obstruction: Secondary | ICD-10-CM | POA: Diagnosis not present

## 2014-08-21 DIAGNOSIS — Z01812 Encounter for preprocedural laboratory examination: Secondary | ICD-10-CM | POA: Insufficient documentation

## 2014-08-21 DIAGNOSIS — Z01818 Encounter for other preprocedural examination: Secondary | ICD-10-CM | POA: Diagnosis not present

## 2014-08-21 LAB — COMPREHENSIVE METABOLIC PANEL
ALT: 32 U/L (ref 0–53)
ANION GAP: 6 (ref 5–15)
AST: 29 U/L (ref 0–37)
Albumin: 4 g/dL (ref 3.5–5.2)
Alkaline Phosphatase: 73 U/L (ref 39–117)
BUN: 12 mg/dL (ref 6–23)
CO2: 32 mmol/L (ref 19–32)
Calcium: 9.3 mg/dL (ref 8.4–10.5)
Chloride: 98 mmol/L (ref 96–112)
Creatinine, Ser: 0.9 mg/dL (ref 0.50–1.35)
GFR calc Af Amer: >90 mL/min (ref >90)
GFR calc non Af Amer: 82 mL/min — ABNORMAL LOW (ref >90)
Glucose, Bld: 95 mg/dL (ref 70–99)
POTASSIUM: 4.3 mmol/L (ref 3.5–5.1)
Sodium: 136 mmol/L (ref 135–145)
TOTAL PROTEIN: 7.2 g/dL (ref 6.0–8.3)
Total Bilirubin: 0.8 mg/dL (ref 0.3–1.2)

## 2014-08-21 LAB — CBC WITH DIFFERENTIAL/PLATELET
BASOS ABS: 0 10*3/uL (ref 0.0–0.1)
BASOS PCT: 0 % (ref 0–1)
Eosinophils Absolute: 0.3 10*3/uL (ref 0.0–0.7)
Eosinophils Relative: 4 % (ref 0–5)
HEMATOCRIT: 40.8 % (ref 39.0–52.0)
Hemoglobin: 14.4 g/dL (ref 13.0–17.0)
Lymphocytes Relative: 31 % (ref 12–46)
Lymphs Abs: 2.1 10*3/uL (ref 0.7–4.0)
MCH: 31.6 pg (ref 26.0–34.0)
MCHC: 35.3 g/dL (ref 30.0–36.0)
MCV: 89.7 fL (ref 78.0–100.0)
MONO ABS: 0.7 10*3/uL (ref 0.1–1.0)
Monocytes Relative: 11 % (ref 3–12)
NEUTROS ABS: 3.7 10*3/uL (ref 1.7–7.7)
Neutrophils Relative %: 54 % (ref 43–77)
PLATELETS: 239 10*3/uL (ref 150–400)
RBC: 4.55 MIL/uL (ref 4.22–5.81)
RDW: 13.1 % (ref 11.5–15.5)
WBC: 6.8 10*3/uL (ref 4.0–10.5)

## 2014-08-21 LAB — PROTIME-INR
INR: 1.09 (ref 0.00–1.49)
PROTHROMBIN TIME: 14.2 s (ref 11.6–15.2)

## 2014-08-21 NOTE — Progress Notes (Signed)
PCP: Dr. Willette Alma Cardiologist: Dr. Acie Fredrickson

## 2014-08-21 NOTE — Pre-Procedure Instructions (Signed)
James [name]  08/21/2014   Your procedure is scheduled on:  Monday, March7  Report to [facility] Main Entrance "A" at 11:00 AM.  Call this number if you have problems the morning of surgery: [phone]              If any questions prior to surgery call [phone] (Monday-Friday 8:00am-4:00pm)   Remember:   Do not eat food or drink liquids after midnight.   Take these medicines the morning of surgery with A SIP OF WATER: dexilant, levothyroxine (synthroid), lorazepan (ativan), meclizine (antivert) if needed, nitroglycerine if needed, pantoprazole (protonix)               Stop aspirin,vitamins, herbal medications, naproxen   Do not wear jewelry, make-up or nail polish.  Do not wear lotions, powders, or perfumes. You may wear deodorant.  Do not shave 48 hours prior to surgery. Men may shave face and neck.  Do not bring valuables to the hospital.  [facility] is not responsible                  for any belongings or valuables.               Contacts, dentures or bridgework may not be worn into surgery.  Leave suitcase in the car. After surgery it may be brought to your room.  For patients admitted to the hospital, discharge time is determined by your                treatment team.               Patients discharged the day of surgery will not be allowed to drive  home.  Name and phone number of your driver:   Special Instructions: review preparing for surgery handout   Please read over the following fact sheets that you were given: Pain Booklet, Coughing and Deep Breathing and Surgical Site Infection Prevention

## 2014-08-24 MED ORDER — CEFAZOLIN SODIUM-DEXTROSE 2-3 GM-% IV SOLR
2.0000 g | INTRAVENOUS | Status: AC
  Administered 2014-08-25: 2 g via INTRAVENOUS
  Filled 2014-08-24: qty 50

## 2014-08-25 ENCOUNTER — Telehealth: Payer: Self-pay | Admitting: Family Medicine

## 2014-08-25 ENCOUNTER — Encounter (HOSPITAL_COMMUNITY)
Admission: RE | Disposition: A | Discharge status: Home / Self Care | Payer: Self-pay | Source: Ambulatory Visit | Attending: General Surgery

## 2014-08-25 ENCOUNTER — Ambulatory Visit (HOSPITAL_COMMUNITY)
Admission: RE | Admit: 2014-08-25 | Discharge: 2014-08-25 | Disposition: A | Discharge status: Home / Self Care | Payer: Medicare Other | Source: Ambulatory Visit | Attending: General Surgery | Admitting: General Surgery

## 2014-08-25 ENCOUNTER — Ambulatory Visit (HOSPITAL_COMMUNITY): Payer: Medicare Other | Admitting: Emergency Medicine

## 2014-08-25 ENCOUNTER — Ambulatory Visit (HOSPITAL_COMMUNITY): Payer: Medicare Other

## 2014-08-25 ENCOUNTER — Ambulatory Visit (HOSPITAL_COMMUNITY): Payer: Medicare Other | Admitting: Anesthesiology

## 2014-08-25 ENCOUNTER — Encounter (HOSPITAL_COMMUNITY): Payer: Self-pay | Admitting: *Deleted

## 2014-08-25 DIAGNOSIS — K811 Chronic cholecystitis: Secondary | ICD-10-CM | POA: Diagnosis not present

## 2014-08-25 DIAGNOSIS — I4891 Unspecified atrial fibrillation: Secondary | ICD-10-CM | POA: Insufficient documentation

## 2014-08-25 DIAGNOSIS — Z859 Personal history of malignant neoplasm, unspecified: Secondary | ICD-10-CM | POA: Diagnosis not present

## 2014-08-25 DIAGNOSIS — Z7982 Long term (current) use of aspirin: Secondary | ICD-10-CM | POA: Diagnosis not present

## 2014-08-25 DIAGNOSIS — E039 Hypothyroidism, unspecified: Secondary | ICD-10-CM | POA: Insufficient documentation

## 2014-08-25 DIAGNOSIS — Z9089 Acquired absence of other organs: Secondary | ICD-10-CM | POA: Diagnosis not present

## 2014-08-25 DIAGNOSIS — I2510 Atherosclerotic heart disease of native coronary artery without angina pectoris: Secondary | ICD-10-CM | POA: Diagnosis not present

## 2014-08-25 DIAGNOSIS — K8020 Calculus of gallbladder without cholecystitis without obstruction: Secondary | ICD-10-CM

## 2014-08-25 DIAGNOSIS — I739 Peripheral vascular disease, unspecified: Secondary | ICD-10-CM | POA: Insufficient documentation

## 2014-08-25 DIAGNOSIS — K219 Gastro-esophageal reflux disease without esophagitis: Secondary | ICD-10-CM | POA: Insufficient documentation

## 2014-08-25 DIAGNOSIS — Z79899 Other long term (current) drug therapy: Secondary | ICD-10-CM | POA: Insufficient documentation

## 2014-08-25 HISTORY — PX: CHOLECYSTECTOMY: SHX55

## 2014-08-25 SURGERY — LAPAROSCOPIC CHOLECYSTECTOMY WITH INTRAOPERATIVE CHOLANGIOGRAM
Anesthesia: General | Site: Abdomen

## 2014-08-25 MED ORDER — PROPOFOL 10 MG/ML IV BOLUS
INTRAVENOUS | Status: DC | PRN
  Administered 2014-08-25: 150 mg via INTRAVENOUS

## 2014-08-25 MED ORDER — LACTATED RINGERS IV SOLN
INTRAVENOUS | Status: DC | PRN
  Administered 2014-08-25 (×2): via INTRAVENOUS

## 2014-08-25 MED ORDER — ONDANSETRON HCL 4 MG/2ML IJ SOLN
INTRAMUSCULAR | Status: DC | PRN
  Administered 2014-08-25: 4 mg via INTRAVENOUS

## 2014-08-25 MED ORDER — ROCURONIUM BROMIDE 50 MG/5ML IV SOLN
INTRAVENOUS | Status: AC
  Filled 2014-08-25: qty 1

## 2014-08-25 MED ORDER — KETOROLAC TROMETHAMINE 30 MG/ML IJ SOLN: 30.0000 mg | Freq: Once | INTRAMUSCULAR | Status: DC | PRN

## 2014-08-25 MED ORDER — MIDAZOLAM HCL 5 MG/5ML IJ SOLN
INTRAMUSCULAR | Status: DC | PRN
  Administered 2014-08-25: 2 mg via INTRAVENOUS

## 2014-08-25 MED ORDER — HYDROMORPHONE HCL 1 MG/ML IJ SOLN
INTRAMUSCULAR | Status: AC
  Filled 2014-08-25: qty 1

## 2014-08-25 MED ORDER — MIDAZOLAM HCL 2 MG/2ML IJ SOLN
INTRAMUSCULAR | Status: AC
  Filled 2014-08-25: qty 2

## 2014-08-25 MED ORDER — LACTATED RINGERS IV SOLN
INTRAVENOUS | Status: DC
  Administered 2014-08-25: 08:00:00 via INTRAVENOUS

## 2014-08-25 MED ORDER — 0.9 % SODIUM CHLORIDE (POUR BTL) OPTIME
TOPICAL | Status: DC | PRN
  Administered 2014-08-25: 1000 mL

## 2014-08-25 MED ORDER — NEOSTIGMINE METHYLSULFATE 10 MG/10ML IV SOLN
INTRAVENOUS | Status: DC | PRN
Start: 2014-08-25 — End: 2014-08-25
  Administered 2014-08-25: 3 mg via INTRAVENOUS

## 2014-08-25 MED ORDER — ONDANSETRON HCL 4 MG/2ML IJ SOLN
INTRAMUSCULAR | Status: AC
  Filled 2014-08-25: qty 2

## 2014-08-25 MED ORDER — PHENYLEPHRINE 40 MCG/ML (10ML) SYRINGE FOR IV PUSH (FOR BLOOD PRESSURE SUPPORT)
PREFILLED_SYRINGE | INTRAVENOUS | Status: AC
  Filled 2014-08-25: qty 10

## 2014-08-25 MED ORDER — OXYCODONE HCL 5 MG PO TABS: 5.0000 mg | ORAL_TABLET | ORAL | Status: DC | PRN

## 2014-08-25 MED ORDER — FENTANYL CITRATE 0.05 MG/ML IJ SOLN
INTRAMUSCULAR | Status: AC
  Filled 2014-08-25: qty 5

## 2014-08-25 MED ORDER — GLYCOPYRROLATE 0.2 MG/ML IJ SOLN
INTRAMUSCULAR | Status: AC
  Filled 2014-08-25: qty 2

## 2014-08-25 MED ORDER — NEOSTIGMINE METHYLSULFATE 10 MG/10ML IV SOLN
INTRAVENOUS | Status: AC
  Filled 2014-08-25: qty 1

## 2014-08-25 MED ORDER — PANTOPRAZOLE SODIUM 40 MG PO TBEC: 40.0000 mg | DELAYED_RELEASE_TABLET | Freq: Two times a day (BID) | ORAL | Status: DC

## 2014-08-25 MED ORDER — SODIUM CHLORIDE 0.9 % IR SOLN
Status: DC | PRN
  Administered 2014-08-25: 1000 mL

## 2014-08-25 MED ORDER — FENTANYL CITRATE 0.05 MG/ML IJ SOLN
INTRAMUSCULAR | Status: DC | PRN
  Administered 2014-08-25: 50 ug via INTRAVENOUS
  Administered 2014-08-25: 100 ug via INTRAVENOUS
  Administered 2014-08-25 (×2): 50 ug via INTRAVENOUS

## 2014-08-25 MED ORDER — BUPIVACAINE-EPINEPHRINE (PF) 0.25% -1:200000 IJ SOLN
INTRAMUSCULAR | Status: AC
  Filled 2014-08-25: qty 30

## 2014-08-25 MED ORDER — DEXTROSE IN LACTATED RINGERS 5 % IV SOLN: INTRAVENOUS | Status: DC

## 2014-08-25 MED ORDER — HYDROMORPHONE HCL 1 MG/ML IJ SOLN
0.2500 mg | INTRAMUSCULAR | Status: DC | PRN
  Administered 2014-08-25: 0.5 mg via INTRAVENOUS

## 2014-08-25 MED ORDER — ROCURONIUM BROMIDE 100 MG/10ML IV SOLN
INTRAVENOUS | Status: DC | PRN
  Administered 2014-08-25: 40 mg via INTRAVENOUS

## 2014-08-25 MED ORDER — PROMETHAZINE HCL 25 MG/ML IJ SOLN: 6.2500 mg | INTRAMUSCULAR | Status: DC | PRN

## 2014-08-25 MED ORDER — LIDOCAINE HCL (CARDIAC) 20 MG/ML IV SOLN
INTRAVENOUS | Status: AC
  Filled 2014-08-25: qty 5

## 2014-08-25 MED ORDER — LIDOCAINE HCL (CARDIAC) 20 MG/ML IV SOLN
INTRAVENOUS | Status: DC | PRN
  Administered 2014-08-25: 80 mg via INTRAVENOUS

## 2014-08-25 MED ORDER — PROPOFOL 10 MG/ML IV BOLUS
INTRAVENOUS | Status: AC
  Filled 2014-08-25: qty 20

## 2014-08-25 MED ORDER — SODIUM CHLORIDE 0.9 % IV SOLN
INTRAVENOUS | Status: DC | PRN
  Administered 2014-08-25: 6 mL

## 2014-08-25 MED ORDER — BUPIVACAINE-EPINEPHRINE 0.25% -1:200000 IJ SOLN
INTRAMUSCULAR | Status: DC | PRN
  Administered 2014-08-25: 12 mL

## 2014-08-25 MED ORDER — GLYCOPYRROLATE 0.2 MG/ML IJ SOLN
INTRAMUSCULAR | Status: DC | PRN
  Administered 2014-08-25: 0.4 mg via INTRAVENOUS

## 2014-08-25 MED ORDER — PHENYLEPHRINE HCL 10 MG/ML IJ SOLN
INTRAMUSCULAR | Status: DC | PRN
  Administered 2014-08-25 (×2): 80 ug via INTRAVENOUS

## 2014-08-25 MED ORDER — EPHEDRINE SULFATE 50 MG/ML IJ SOLN
INTRAMUSCULAR | Status: DC | PRN
Start: 2014-08-25 — End: 2014-08-25
  Administered 2014-08-25: 5 mg via INTRAVENOUS
  Administered 2014-08-25 (×2): 10 mg via INTRAVENOUS

## 2014-08-25 SURGICAL SUPPLY — 50 items
APL SKNCLS STERI-STRIP NONHPOA (GAUZE/BANDAGES/DRESSINGS) ×1
APPLIER CLIP 5 13 M/L LIGAMAX5 (MISCELLANEOUS) ×2
APR CLP MED LRG 5 ANG JAW (MISCELLANEOUS) ×1
BAG SPEC RTRVL LRG 6X4 10 (ENDOMECHANICALS) ×1
BENZOIN TINCTURE PRP APPL 2/3 (GAUZE/BANDAGES/DRESSINGS) ×2 IMPLANT
CANISTER SUCTION 2500CC (MISCELLANEOUS) ×2 IMPLANT
CHLORAPREP W/TINT 26ML (MISCELLANEOUS) ×2 IMPLANT
CLIP APPLIE 5 13 M/L LIGAMAX5 (MISCELLANEOUS) ×1 IMPLANT
COVER MAYO STAND STRL (DRAPES) ×2 IMPLANT
COVER SURGICAL LIGHT HANDLE (MISCELLANEOUS) ×2 IMPLANT
DECANTER SPIKE VIAL GLASS SM (MISCELLANEOUS) ×2 IMPLANT
DRAPE C-ARM 42X72 X-RAY (DRAPES) ×2 IMPLANT
DRAPE LAPAROSCOPIC ABDOMINAL (DRAPES) ×2 IMPLANT
DRSG TEGADERM 2-3/8X2-3/4 SM (GAUZE/BANDAGES/DRESSINGS) ×8 IMPLANT
ELECT REM PT RETURN 9FT ADLT (ELECTROSURGICAL) ×2
ELECTRODE REM PT RTRN 9FT ADLT (ELECTROSURGICAL) ×1 IMPLANT
GAUZE SPONGE 2X2 8PLY STRL LF (GAUZE/BANDAGES/DRESSINGS) ×1 IMPLANT
GLOVE BIO SURGEON STRL SZ 6.5 (GLOVE) ×1 IMPLANT
GLOVE BIO SURGEON STRL SZ8 (GLOVE) ×1 IMPLANT
GLOVE BIOGEL PI IND STRL 7.0 (GLOVE) IMPLANT
GLOVE BIOGEL PI IND STRL 8 (GLOVE) ×1 IMPLANT
GLOVE BIOGEL PI INDICATOR 7.0 (GLOVE) ×1
GLOVE BIOGEL PI INDICATOR 8 (GLOVE) ×3
GLOVE ECLIPSE 8.0 STRL XLNG CF (GLOVE) ×2 IMPLANT
GLOVE ECLIPSE 8.5 STRL (GLOVE) ×1 IMPLANT
GLOVE SURG SS PI 7.0 STRL IVOR (GLOVE) ×1 IMPLANT
GOWN STRL REUS W/ TWL LRG LVL3 (GOWN DISPOSABLE) ×3 IMPLANT
GOWN STRL REUS W/ TWL XL LVL3 (GOWN DISPOSABLE) IMPLANT
GOWN STRL REUS W/TWL LRG LVL3 (GOWN DISPOSABLE) ×4
GOWN STRL REUS W/TWL XL LVL3 (GOWN DISPOSABLE) ×4
KIT BASIN OR (CUSTOM PROCEDURE TRAY) ×2 IMPLANT
KIT ROOM TURNOVER OR (KITS) ×2 IMPLANT
NS IRRIG 1000ML POUR BTL (IV SOLUTION) ×2 IMPLANT
PAD ARMBOARD 7.5X6 YLW CONV (MISCELLANEOUS) ×2 IMPLANT
POUCH SPECIMEN RETRIEVAL 10MM (ENDOMECHANICALS) ×2 IMPLANT
SCISSORS LAP 5X35 DISP (ENDOMECHANICALS) ×2 IMPLANT
SET CHOLANGIOGRAPH 5 50 .035 (SET/KITS/TRAYS/PACK) ×2 IMPLANT
SET IRRIG TUBING LAPAROSCOPIC (IRRIGATION / IRRIGATOR) ×2 IMPLANT
SLEEVE ENDOPATH XCEL 5M (ENDOMECHANICALS) ×4 IMPLANT
SPECIMEN JAR SMALL (MISCELLANEOUS) ×2 IMPLANT
SPONGE GAUZE 2X2 STER 10/PKG (GAUZE/BANDAGES/DRESSINGS) ×1
STRIP CLOSURE SKIN 1/2X4 (GAUZE/BANDAGES/DRESSINGS) ×1 IMPLANT
SUT MON AB 4-0 PC3 18 (SUTURE) ×3 IMPLANT
TOWEL OR 17X24 6PK STRL BLUE (TOWEL DISPOSABLE) ×2 IMPLANT
TOWEL OR 17X26 10 PK STRL BLUE (TOWEL DISPOSABLE) ×1 IMPLANT
TRAY LAPAROSCOPIC (CUSTOM PROCEDURE TRAY) ×2 IMPLANT
TROCAR XCEL BLUNT TIP 100MML (ENDOMECHANICALS) ×2 IMPLANT
TROCAR XCEL NON-BLD 11X100MML (ENDOMECHANICALS) IMPLANT
TROCAR XCEL NON-BLD 5MMX100MML (ENDOMECHANICALS) ×2 IMPLANT
TUBING INSUFFLATION (TUBING) ×2 IMPLANT

## 2014-08-25 NOTE — Anesthesia Postprocedure Evaluation (Signed)
  Anesthesia Post-op Note  Patient: James [name]  Procedure(s) Performed: Procedure(s) (LRB): LAPAROSCOPIC CHOLECYSTECTOMY WITH INTRAOPERATIVE CHOLANGIOGRAM (N/A)  Patient Location: PACU  Anesthesia Type: General  Level of Consciousness: awake and alert   Airway and Oxygen Therapy: Patient Spontanous Breathing  Post-op Pain: mild  Post-op Assessment: Post-op Vital signs reviewed, Patient's Cardiovascular Status Stable, Respiratory Function Stable, Patent Airway and No signs of Nausea or vomiting  Last Vitals:  Filed Vitals:   08/25/14 1145  BP: 106/61  Pulse: 48  Temp:   Resp: 19    Post-op Vital Signs: stable   Complications: No apparent anesthesia complications

## 2014-08-25 NOTE — Telephone Encounter (Signed)
Refill for pantoprazole sent to [facility]

## 2014-08-25 NOTE — Op Note (Signed)
Preoperative diagnosis:  Chronic cholecystitis, microlithiasis  Postoperative diagnosis:  Same   Procedure: Laparoscopic cholecystectomy with cholangiogram.  Surgeon: [name], M.D.  Asst.:  Georganna Skeans, MD  Anesthesia: General  Indication:   This is a 74 year old male with typical biliary colic after fatty meals and GB sludge on [place] who presents for elective cholecystectomy.  Technique: He was brought to the operating room, placed supine on the operating table, and a general anesthetic was administered. The hair on the abdominal wall was clipped as was necessary. The abdominal wall was then sterilely prepped and draped. Local anesthetic (Marcaine) was infiltrated in the subumbilical region. A small subumbilical incision was made through the skin, subcutaneous tissue, fascia, and peritoneum entering the peritoneal cavity under direct vision. A pursestring suture of 0 Vicryl was placed around the edges of the fascia. A Hassan trocar was introduced into the peritoneal cavity and a pneumoperitoneum was created by insufflation of carbon dioxide gas. The laparoscope was introduced into the trocar and no underlying bleeding or organ injury was noted. He was then placed in the reverse Trendelenburg position with the right side tilted slightly up.  Three 5 mm trocars were then placed into the abdominal cavity under laparoscopic vision. One in the epigastric area, and 2 in the right upper quadrant area. The gallbladder was visualized and the fundus was grasped and retracted toward the right shoulder.  The infundibulum was mobilized with dissection close to the gallbladder and retracted laterally. The cystic duct was identified and a window was created around it. The anterior branch of the cystic artery was also identified and a window was created around it. The critical view was achieved. A clip was placed at the neck of the gallbladder. A small incision was made in the cystic duct. A  cholangiocatheter was introduced through the anterior abdominal wall and placed in the cystic duct. A intraoperative cholangiogram was then performed.  Under real-time fluoroscopy, dilute contrast was injected into the cystic duct.  The common hepatic duct, the right and left hepatic ducts, and the common duct were all visualized. Contrast drained into the duodenum without obvious evidence of any obstructing ductal lesion. The final report is pending the Radiologist's interpretation.  The cholangiocatheter was removed, the cystic duct was clipped 3 times on the biliary side, and then the cystic duct was divided sharply. No bile leak was noted from the cystic duct stump.  The anterior branch of the cystic artery was then clipped and divided. The posterior branch of the cystic artery was isolated, clipped and divided. Following this the gallbladder was dissected free from the liver using electrocautery. The gallbladder was then placed in a retrieval bag and removed from the abdominal cavity through the subumbilical incision.  The gallbladder fossa was inspected, irrigated, and bleeding was controlled with electrocautery. Inspection showed that hemostasis was adequate and there was no evidence of bile leak.  The irrigation fluid was evacuated as much as possible.  The subumbilical trocar was removed and the fascial defect was closed by tightening and tying down the pursestring suture under laparoscopic vision.  The remaining trocars were removed and the pneumoperitoneum was released. The skin incisions were closed with 4-0 Monocryl subcuticular stitches. Steri-Strips and sterile dressings were applied.  The procedure was well-tolerated without any apparent complications. He was taken to the recovery room in satisfactory condition.

## 2014-08-25 NOTE — Anesthesia Preprocedure Evaluation (Signed)
Anesthesia Evaluation  Patient identified by MRN, date of birth, ID band Patient awake    Reviewed: Allergy & Precautions, NPO status , Patient's Chart, lab work & pertinent test results  Airway Mallampati: II  TM Distance: >3 FB Neck ROM: Full    Dental no notable dental hx.    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  H/O A fib     Neuro/Psych negative neurological ROS  negative psych ROS   GI/Hepatic Neg liver ROS, GERD-  ,  Endo/Other  negative endocrine ROSHypothyroidism   Renal/GU negative Renal ROS  negative genitourinary   Musculoskeletal negative musculoskeletal ROS (+)   Abdominal   Peds negative pediatric ROS (+)  Hematology negative hematology ROS (+)   Anesthesia Other Findings   Reproductive/Obstetrics negative OB ROS                             Anesthesia Physical Anesthesia Plan  ASA: III  Anesthesia Plan: General   Post-op Pain Management:    Induction: Intravenous  Airway Management Planned: Oral ETT  Additional Equipment:   Intra-op Plan:   Post-operative Plan: Extubation in OR  Informed Consent: I have reviewed the patients History and Physical, chart, labs and discussed the procedure including the risks, benefits and alternatives for the proposed anesthesia with the patient or authorized representative who has indicated his/her understanding and acceptance.   Dental advisory given  Plan Discussed with: CRNA and Surgeon  Anesthesia Plan Comments:         Anesthesia Quick Evaluation

## 2014-08-25 NOTE — H&P (View-Only) (Signed)
Patient ID: James [name], male   DOB: 05/16/1941, 74 y.o.   MRN: [id number]  Lyons Alaimo 08/06/2014 9:57 AM Location: [facility] Patient #: [id number] DOB: 11-Mar-1941 Divorced / Language: Cleophus Molt / Race: White Male History of Present Illness Odis Hollingshead MD; 08/06/2014 10:32 AM) The patient is a 74 year old male who presents for evaluation of gallbladder disease.  Note:He is referred by Dr. Benson Norway because of post- prandialRUQ abdominal pain, bloating. Recently, he ate some fried fish and he had a classic episode of epigastric right upper quadrant pain rating through to his back associated with nausea. EGD was negative. Ultrasound demonstrated 2 small liver lesions as well as gallbladder sludge. Because of his biliary colic type symptoms and gallbladder sludge he's been sent here to discuss cholecystectomy. He has a family history of gallbladder disease. Liver function tests are normal. Symptoms are not relieved by proton pump inhibitor.  Other Problems Jeralyn Ruths, [place]; 08/06/2014 9:57 AM) Cancer Gastroesophageal Reflux Disease Heart murmur Inguinal Hernia Thyroid Disease  Past Surgical History Jeralyn Ruths, CMA; 08/06/2014 9:57 AM) Appendectomy Colon Polyp Removal - Colonoscopy Knee Surgery Bilateral. Laparoscopic Inguinal Hernia Surgery Right. Oral Surgery Tonsillectomy Vasectomy  Allergies Jeralyn Ruths, CMA; 08/06/2014 10:06 AM) Sulfabenzamide *CHEMICALS*  Medication History Jeralyn Ruths, CMA; 08/06/2014 10:05 AM) Aspirin (81MG  Tablet, 1 Oral daily) Active. Calcium Carbonate Antacid (1000MG  Tablet, Oral) Active. Calcium Carb-Cholecalciferol (1000-800MG -UNIT Tablet, 1 Oral daily) Active. (With Vitamin D) Dexilant (60MG  Capsule DR, 1 Oral daily) Active. Jalyn (0.5-0.4MG  Capsule, 1 Oral qhs) Active. Levothyroxine Sodium (100MCG Tablet, 1 Oral daily) Active. Ativan (0.5MG  Tablet, Oral prn) Active. Remeron (15MG  Tablet, 1 Oral  daily) Active. Multivitamin (1 Oral daily) Active. (With minerals) Nitroglycerin (0.4MG  Tab Sublingual, 1 Sublingual prn) Active. Viagra (100MG  Tablet, Oral prn) Active. Axiron (30MG /ACT Solution, 1 Transdermal daily) Active. Ambien (10MG  Tablet, 1 Oral qhs) Active.  Social History Jeralyn Ruths, [place]; 08/06/2014 9:57 AM) Alcohol use Moderate alcohol use. Caffeine use Coffee. No drug use Tobacco use Never smoker.  Family History Jeralyn Ruths, [place]; 08/06/2014 9:57 AM) Colon Cancer Family Members In General, Sister. Heart Disease Mother. Thyroid problems Daughter, Mother.     Review of Systems Jearld Fenton Morris CMA; 08/06/2014 9:58 AM) General Not Present- Appetite Loss, Chills, Fatigue, Fever, Night Sweats, Weight Gain and Weight Loss. Skin Not Present- Change in Wart/Mole, Dryness, Hives, Jaundice, New Lesions, Non-Healing Wounds, Rash and Ulcer. HEENT Not Present- Earache, Hearing Loss, Hoarseness, Nose Bleed, Oral Ulcers, Ringing in the Ears, Seasonal Allergies, Sinus Pain, Sore Throat, Visual Disturbances, Wears glasses/contact lenses and Yellow Eyes. Respiratory Not Present- Bloody sputum, Chronic Cough, Difficulty Breathing, Snoring and Wheezing. Breast Not Present- Breast Mass, Breast Pain, Nipple Discharge and Skin Changes. Cardiovascular Not Present- Chest Pain, Difficulty Breathing Lying Down, Leg Cramps, Palpitations, Rapid Heart Rate, Shortness of Breath and Swelling of Extremities. Gastrointestinal Present- Bloating and Excessive gas. Not Present- Abdominal Pain, Bloody Stool, Change in Bowel Habits, Chronic diarrhea, Constipation, Difficulty Swallowing, Gets full quickly at meals, Hemorrhoids, Indigestion, Nausea, Rectal Pain and Vomiting. Male Genitourinary Not Present- Blood in Urine, Change in Urinary Stream, Frequency, Impotence, Nocturia, Painful Urination, Urgency and Urine Leakage. Musculoskeletal Not Present- Back Pain, Joint Pain, Joint Stiffness, Muscle  Pain, Muscle Weakness and Swelling of Extremities. Neurological Not Present- Decreased Memory, Fainting, Headaches, Numbness, Seizures, Tingling, Tremor, Trouble walking and Weakness. Psychiatric Not Present- Anxiety, Bipolar, Change in Sleep Pattern, Depression, Fearful and Frequent crying. Endocrine Not Present- Cold Intolerance, Excessive Hunger, Hair Changes, Heat Intolerance, Hot flashes  and New Diabetes. Hematology Not Present- Easy Bruising, Excessive bleeding, Gland problems, HIV and Persistent Infections.  Vitals Jearld Fenton Morris CMA; 08/06/2014 10:08 AM) 08/06/2014 10:07 AM Weight: 156.45 lb Temp.: 97.45F(Oral)  Pulse: 60 (Regular)  Resp.: 18 (Unlabored)  BP: 118/68 (Sitting, Left Arm, Standard)     Physical Exam Odis Hollingshead MD; 08/06/2014 1:19 PM)  The physical exam findings are as follows: Note:General: WDWN in NAD. Pleasant and cooperative.  HEENT: Weiser/AT, no facial masses  EYES: EOMI, no icterus  CV: RRR, no murmur, no JVD.  ABDOMEN: Soft, nontender, nondistended, no masses, no organomegaly, active bowel sounds, no scars, no hernias.  SKIN: No jaundice or suspicious rashes.  NEUROLOGIC: Alert and oriented, answers questions appropriately, normal gait and station.  PSYCHIATRIC: Normal mood, affect , and behavior.    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 He has gallbladder sludge which could be microlithiasis as well as a family history of gallbladder disease.  Plan: We discussed laparoscopic cholecystectomy with cholangiogram versus observation and strict low-fat diet. We went over the procedure, risks, and success rate of the cholecystectomy. I have explained the procedure, risks, and aftercare of cholecystectomy. Risks include but are not limited to bleeding, infection, wound problems,  anesthesia, diarrhea, bile leak, injury to common bile duct/liver/intestine. He seems to understand and would like to proceed. All questions were answered.  Current Plans Free Text Instructions : discussed with patient and provided information. Schedule for Surgery  [name], MD

## 2014-08-25 NOTE — Interval H&P Note (Signed)
History and Physical Interval Note:  08/25/2014 9:40 AM  James [name]  has presented today for surgery, with the diagnosis of SYMTOMATIC CHOLELITHIASIS  The various methods of treatment have been discussed with the patient and family. After consideration of risks, benefits and other options for treatment, the patient has consented to  Procedure(s): LAPAROSCOPIC CHOLECYSTECTOMY WITH INTRAOPERATIVE CHOLANGIOGRAM (N/A) as a surgical intervention .  The patient's history has been reviewed, patient examined, no change in status, stable for surgery.  I have reviewed the patient's chart and labs.  Questions were answered to the patient's satisfaction.     [name]

## 2014-08-25 NOTE — Transfer of Care (Signed)
Immediate Anesthesia Transfer of Care Note  Patient: James [name]  Procedure(s) Performed: Procedure(s): LAPAROSCOPIC CHOLECYSTECTOMY WITH INTRAOPERATIVE CHOLANGIOGRAM (N/A)  Patient Location: PACU  Anesthesia Type:General  Level of Consciousness: awake, alert , oriented and patient cooperative  Airway & Oxygen Therapy: Patient Spontanous Breathing  Post-op Assessment: Report given to RN, Post -op Vital signs reviewed and stable and Patient moving all extremities  Post vital signs: Reviewed and stable  Last Vitals:  Filed Vitals:   08/25/14 0812  BP: 123/78  Pulse: 54  Temp: 36.4 C  Resp: 18    Complications: No apparent anesthesia complications

## 2014-08-25 NOTE — Discharge Instructions (Addendum)
[facility] ______[facility] LAPAROSCOPIC SURGERY: POST OP INSTRUCTIONS Always review your discharge instruction sheet given to you by the facility where your surgery was performed. IF YOU HAVE DISABILITY OR FAMILY LEAVE FORMS, YOU MUST BRING THEM TO THE OFFICE FOR PROCESSING.   DO NOT GIVE THEM TO YOUR DOCTOR.  1. A prescription for pain medication may be given to you upon discharge.  Take your pain medication as prescribed, if needed.  If narcotic pain medicine is not needed, then you may take acetaminophen (Tylenol) or ibuprofen (Advil) as needed. 2. Take your usually prescribed medications unless otherwise directed. 3. If you need a refill on your pain medication, please contact your pharmacy.  They will contact our office to request authorization. Prescriptions will not be filled after 5pm or on week-ends. 4. You should follow a liquid diet the day of surgery and the day after, two days after the surgery you may start a solid low fat diet.  Be sure to include lots of fluids daily. 5. Most patients will experience some swelling and bruising in the area of the incisions.  Ice packs will help.  Swelling and bruising can take several days to resolve.  6. It is common to experience some constipation if taking pain medication after surgery.  Increasing fluid intake and taking a stool softener (such as Colace) will usually help or prevent this problem from occurring.  A mild laxative (Milk of Magnesia or Miralax) should be taken according to package instructions if there are no bowel movements after 48 hours. 7. Unless discharge instructions indicate otherwise, you may remove your bandages 72 hours after surgery, and you may shower the day after surgery.  You may have steri-strips (small skin tapes) in place directly over the incision.  These strips should be left on the skin.  If your surgeon used skin glue on the incision, you may shower in 24 hours.  The glue will flake off over the next 2-3  weeks.  Any sutures or staples will be removed at the office during your follow-up visit. 8. ACTIVITIES:  You may resume regular (light) daily activities beginning the next day--such as daily self-care, walking, climbing stairs--gradually increasing activities as tolerated.  You may have sexual intercourse when it is comfortable.  Refrain from any heavy lifting or straining-nothing over 10lbs for 2 weeks. a. You may drive when you are no longer taking prescription pain medication, you can comfortably wear a seatbelt, and you can safely maneuver your car and apply brakes. b. RETURN TO WORK:  __________________________________________________________ 9. You should see your doctor in the office for a follow-up appointment approximately 2-3 weeks after your surgery.  Make sure that you call for this appointment within a day or two after you arrive home to insure a convenient appointment time. 10. OTHER INSTRUCTIONS: Resume taking your Aspirin on August 28, 2014. __________________________________________________________________________________________________________________________ __________________________________________________________________________________________________________________________ WHEN TO CALL YOUR DOCTOR: 1. Fever over 101.0 2. Inability to urinate 3. Continued bleeding from incision. 4. Increased pain, redness, or drainage from the incision. 5. Increasing abdominal pain  The clinic staff is available to answer your questions during regular business hours.  Please dont hesitate to call and ask to speak to one of the nurses for clinical concerns.  If you have a medical emergency, go to the nearest emergency room or call 911.  A surgeon from [facility] is always on call at the hospital. [address] [place], [place], [place]  [address] ? [id number], [place],  [place]   [address] [phone] ? [phone] ? FAX [phone] Web site:  [email or web address]  General Anesthesia, Adult, Care After  Refer to this sheet in the next few weeks. These instructions provide you with information on caring for yourself after your procedure. Your health care provider may also give you more specific instructions. Your treatment has been planned according to current medical practices, but problems sometimes occur. Call your health care provider if you have any problems or questions after your procedure.  WHAT TO EXPECT AFTER THE PROCEDURE  After the procedure, it is typical to experience:  Sleepiness.  Nausea and vomiting. HOME CARE INSTRUCTIONS  For the first 24 hours after general anesthesia:  Have a responsible person with you.  Do not drive a car. If you are alone, do not take public transportation.  Do not drink alcohol.  Do not take medicine that has not been prescribed by your health care provider.  Do not sign important papers or make important decisions.  You may resume a normal diet and activities as directed by your health care provider.  Change bandages (dressings) as directed.  If you have questions or problems that seem related to general anesthesia, call the hospital and ask for the anesthetist or anesthesiologist on call. SEEK MEDICAL CARE IF:  You have nausea and vomiting that continue the day after anesthesia.  You develop a rash. SEEK IMMEDIATE MEDICAL CARE IF:  You have difficulty breathing.  You have chest pain.  You have any allergic problems. Document Released: 09/12/2000 Document Revised: 02/06/2013 Document Reviewed: 12/20/2012  [facility] Patient Information 2014 [place], [place].

## 2014-08-26 ENCOUNTER — Encounter (HOSPITAL_COMMUNITY): Payer: Self-pay | Admitting: General Surgery

## 2014-08-26 NOTE — Telephone Encounter (Signed)
PA approved effective 05/30/2014 through 07/29/2015. Ref#: [id number]

## 2014-08-29 ENCOUNTER — Other Ambulatory Visit: Payer: Self-pay | Admitting: *Deleted

## 2014-08-29 MED ORDER — DUTASTERIDE-TAMSULOSIN HCL 0.5-0.4 MG PO CAPS: 1.0000 | ORAL_CAPSULE | Freq: Every day | ORAL | Status: DC

## 2014-08-29 NOTE — Telephone Encounter (Signed)
Jalyn refilled per protocol. JG//CMA

## 2014-09-08 ENCOUNTER — Ambulatory Visit (INDEPENDENT_AMBULATORY_CARE_PROVIDER_SITE_OTHER): Payer: Medicare Other | Admitting: Family Medicine

## 2014-09-08 ENCOUNTER — Ambulatory Visit (HOSPITAL_BASED_OUTPATIENT_CLINIC_OR_DEPARTMENT_OTHER)
Admission: RE | Admit: 2014-09-08 | Discharge: 2014-09-08 | Disposition: A | Discharge status: Home / Self Care | Payer: Medicare Other | Source: Ambulatory Visit | Attending: Family Medicine | Admitting: Family Medicine

## 2014-09-08 ENCOUNTER — Encounter: Payer: Self-pay | Admitting: Family Medicine

## 2014-09-08 VITALS — BP 110/72 | HR 57 | Temp 97.9°F | Resp 16 | Wt 153.0 lb

## 2014-09-08 DIAGNOSIS — E291 Testicular hypofunction: Secondary | ICD-10-CM

## 2014-09-08 DIAGNOSIS — R079 Chest pain, unspecified: Secondary | ICD-10-CM | POA: Diagnosis not present

## 2014-09-08 DIAGNOSIS — R0789 Other chest pain: Secondary | ICD-10-CM | POA: Diagnosis not present

## 2014-09-08 DIAGNOSIS — R7989 Other specified abnormal findings of blood chemistry: Secondary | ICD-10-CM

## 2014-09-08 DIAGNOSIS — I10 Essential (primary) hypertension: Secondary | ICD-10-CM | POA: Diagnosis not present

## 2014-09-08 DIAGNOSIS — E039 Hypothyroidism, unspecified: Secondary | ICD-10-CM | POA: Diagnosis not present

## 2014-09-08 DIAGNOSIS — K219 Gastro-esophageal reflux disease without esophagitis: Secondary | ICD-10-CM

## 2014-09-08 DIAGNOSIS — M549 Dorsalgia, unspecified: Secondary | ICD-10-CM | POA: Diagnosis not present

## 2014-09-08 DIAGNOSIS — E785 Hyperlipidemia, unspecified: Secondary | ICD-10-CM

## 2014-09-08 MED ORDER — DUTASTERIDE-TAMSULOSIN HCL 0.5-0.4 MG PO CAPS: 1.0000 | ORAL_CAPSULE | Freq: Every day | ORAL | Status: DC

## 2014-09-08 MED ORDER — TESTOSTERONE 30 MG/ACT TD SOLN: 1.0000 "application " | Freq: Every morning | TRANSDERMAL | Status: DC

## 2014-09-08 MED ORDER — NITROGLYCERIN 0.4 MG SL SUBL: 0.4000 mg | SUBLINGUAL_TABLET | SUBLINGUAL | Status: DC | PRN

## 2014-09-08 MED ORDER — ATORVASTATIN CALCIUM 20 MG PO TABS: 20.0000 mg | ORAL_TABLET | Freq: Every day | ORAL | Status: DC

## 2014-09-08 NOTE — Progress Notes (Signed)
Pre visit review using our clinic review tool, if applicable. No additional management support is needed unless otherwise documented below in the visit note. 

## 2014-09-08 NOTE — Patient Instructions (Addendum)
Tylenol/Acetaminophen ES 500 mg tabs 2 tabs twice daily with a max of 1 extra dose in the middle of the day  Ginger caps or tea for nausea  Hold Aleve for now   Cholesterol Cholesterol is a white, waxy, fat-like substance needed by your body in small amounts. The liver makes all the cholesterol you need. Cholesterol is carried from the liver by the blood through the blood vessels. Deposits of cholesterol (plaque) may build up on blood vessel walls. These make the arteries narrower and stiffer. Cholesterol plaques increase the risk for heart attack and stroke.  You cannot feel your cholesterol level even if it is very high. The only way to know it is high is with a blood test. Once you know your cholesterol levels, you should keep a record of the test results. Work with your health care provider to keep your levels in the desired range.  WHAT DO THE RESULTS MEAN?  Total cholesterol is a rough measure of all the cholesterol in your blood.   LDL is the so-called bad cholesterol. This is the type that deposits cholesterol in the walls of the arteries. You want this level to be low.   HDL is the good cholesterol because it cleans the arteries and carries the LDL away. You want this level to be high.  Triglycerides are fat that the body can either burn for energy or store. High levels are closely linked to heart disease.  WHAT ARE THE DESIRED LEVELS OF CHOLESTEROL?  Total cholesterol below 200.   LDL below 100 for people at risk, below 70 for those at very high risk.   HDL above 50 is good, above 60 is best.   Triglycerides below 150.  HOW CAN I LOWER MY CHOLESTEROL?  Diet. Follow your diet programs as directed by your health care provider.   Choose fish or white meat chicken and [place], roasted or baked. Limit fatty cuts of red meat, fried foods, and processed meats, such as sausage and lunch meats.   Eat lots of fresh fruits and vegetables.  Choose whole grains, beans, pasta,  potatoes, and cereals.   Use only small amounts of olive, corn, or canola oils.   Avoid butter, mayonnaise, shortening, or palm kernel oils.  Avoid foods with trans fats.   Drink skim or nonfat milk and eat low-fat or nonfat yogurt and cheeses. Avoid whole milk, cream, ice cream, egg yolks, and full-fat cheeses.   Healthy desserts include angel food cake, ginger snaps, animal crackers, hard candy, popsicles, and low-fat or nonfat frozen yogurt. Avoid pastries, cakes, pies, and cookies.   Exercise. Follow your exercise programs as directed by your health care provider.   A regular program helps decrease LDL and raise HDL.   A regular program helps with weight control.   Do things that increase your activity level like gardening, walking, or taking the stairs. Ask your health care provider about how you can be more active in your daily life.   Medicine. Take medicine only as directed by your health care provider.   Medicine may be prescribed by your health care provider to help lower cholesterol and decrease the risk for heart disease.   If you have several risk factors, you may need medicine even if your levels are normal. Document Released: 03/01/2001 Document Revised: 10/21/2013 Document Reviewed: 03/20/2013 [facility] Patient Information 2015 [place], [place]. This information is not intended to replace advice given to you by your health care provider. Make sure you discuss any  questions you have with your health care provider.  

## 2014-09-08 NOTE — Progress Notes (Signed)
James [name]  [id number] 1940-12-03 09/08/2014      Progress Note-Follow Up  Subjective  Chief Complaint  Chief Complaint  Patient presents with  . Follow-up    6 month  . Chest Pain    sternal at diaphragm wither in front or in the back     HPI  Patient is a 74 y.o. male in today for routine medical care. Patient is in today for follow-up on numerous concerns. Generally feeling well and is healing well from his cholecystectomy earlier this month. No fevers or chills. Improved heartburn and chest pain status post surgery. Denies CP/palp/SOB/HA/congestion/fevers/GI or GU c/o. Taking meds as prescribed. Struggling with some arthralgias no recent worsening or swelling/redness over joints  Past Medical History  Diagnosis Date  . CAD (coronary artery disease)     nonobstructive by cath 2008; nonobstructive by Cardiac CT 11/2011  . Hypothyroidism   . Atrial fibrillation   . Diverticulosis   . Mitral valve prolapse   . HLD (hyperlipidemia)   . GERD (gastroesophageal reflux disease)   . Hiatal hernia   . Cervical spine fracture 1980    C6-7  . 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 History of squamous cell carcinoma 10-05-10    Dr [name], [place]  . 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  . Syncope 02/03/2013  . Benign paroxysmal positional vertigo 04/06/2013  . History of atrial fibrillation   . AAA (abdominal aortic aneurysm) without rupture 09/08/2013  . Erectile dysfunction 09/08/2013  . Preventative health care 03/16/2014  . Medicare annual wellness visit, subsequent 03/16/2014    Sees [facility] Follows with Dr Johnsie Cancel of cardiology Sees [place] for colonoscopy, last colonoscopy in 2013   . Anxiety state 05/18/2014  . 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 Appendectomy  05/2011  . Inguinal hernia repair  07/2010    right  . Knee arthroplasty      bilat  . Cardiac  catheterization  01/03/12    30% ostial LAD stenosis, ostial 30-40% D1 stenosis, smooth and normal left main and RCA; LVEF 65%  . Tonsillectomy  1947  . Tumor removal  1958    "fatty tumor"  . Left heart catheterization with coronary angiogram N/A 01/03/2012    Procedure: LEFT HEART CATHETERIZATION WITH CORONARY ANGIOGRAM;  Surgeon: [name], MD;  Location: [facility] CATH LAB;  Service: Cardiovascular;  Laterality: N/A;  . Cholecystectomy N/A 08/25/2014    Procedure: LAPAROSCOPIC CHOLECYSTECTOMY WITH INTRAOPERATIVE CHOLANGIOGRAM;  Surgeon: [name], MD;  Location: [facility] OR;  Service: General;  Laterality: N/A;    Family History  Problem Relation Age of Onset  . Heart disease Mother     MI 72s  . Stroke Mother   . Hypertension Mother   . Heart disease Maternal Grandfather     MI 70-80s  . Heart disease Maternal Grandmother     MI 70-80s  . Lung disease Father     penumonitis  . Cancer Sister     colon  . Cancer Paternal Uncle     GI: colon he thinks    History   Social History  . Marital Status: Single    Spouse Name: N/A  . Number of Children: N/A  . Years of Education: N/A   Occupational History  . Chief Financial Officer     Retired   Social History Main Topics  . Smoking status:  Never Smoker   . Smokeless tobacco: Not on file  . Alcohol Use: Yes     Comment: 2 glasses red wine/day  . Drug Use: No  . Sexual Activity: Yes   Other Topics Concern  . Not on file   Social History Narrative    Current Outpatient Prescriptions on File Prior to Visit  Medication Sig Dispense Refill  . atorvastatin (LIPITOR) 20 MG tablet Take 1 tablet (20 mg total) by mouth daily. 90 tablet 0  . Calcium Carb-Cholecalciferol (CALCIUM 1000 + D PO) Take 1 tablet by mouth daily.    Marland Kitchen dexlansoprazole (DEXILANT) 60 MG capsule Take 1 capsule (60 mg total) by mouth daily. 30 capsule 3  . Dutasteride-Tamsulosin HCl (JALYN) 0.5-0.4 MG CAPS Take 1 capsule by mouth at bedtime. 90 capsule 0  .  levothyroxine (SYNTHROID, LEVOTHROID) 100 MCG tablet TAKE 1 TABLET (100 MCG TOTAL) BY MOUTH DAILY BEFORE BREAKFAST. 30 tablet 2  . LORazepam (ATIVAN) 0.5 MG tablet Take 1 tablet (0.5 mg total) by mouth 2 (two) times daily as needed for anxiety. 30 tablet 0  . meclizine (ANTIVERT) 25 MG tablet Take 1 tablet (25 mg total) by mouth 2 (two) times daily as needed for dizziness or nausea. 30 tablet 2  . Milk Thistle 1000 MG CAPS Take 1,000 mg by mouth daily.    . mirtazapine (REMERON) 15 MG tablet Take 1 tablet (15 mg total) by mouth at bedtime. 90 tablet 1  . Multiple Vitamins-Minerals (MULTIVITAMINS THER. W/MINERALS) TABS Take 1 tablet by mouth daily.    . naproxen sodium (ANAPROX) 220 MG tablet Take 220 mg by mouth daily as needed (pain, headaches).     . sildenafil (VIAGRA) 100 MG tablet Take 1 tablet (100 mg total) by mouth daily as needed for erectile dysfunction. 10 tablet 5  . Testosterone (AXIRON) 30 MG/ACT SOLN Apply 1 application topically every morning. Under each arm 90 mL 1  . zolpidem (AMBIEN) 10 MG tablet Take 1 tablet (10 mg total) by mouth at bedtime as needed for sleep. 30 tablet 5  . oxyCODONE (OXY IR/ROXICODONE) 5 MG immediate release tablet Take 1-2 tablets (5-10 mg total) by mouth every 4 (four) hours as needed for moderate pain, severe pain or breakthrough pain. (Patient not taking: Reported on 09/08/2014) 40 tablet 0  . pantoprazole (PROTONIX) 40 MG tablet Take 1 tablet (40 mg total) by mouth 2 (two) times daily. (Patient not taking: Reported on 09/08/2014) 60 tablet 6   No current facility-administered medications on file prior to visit.    Allergies  Allergen Reactions  . Sulfa Antibiotics Other (See Comments)    Childhood allergy    Review of Systems  Review of Systems  Constitutional: Negative for fever and malaise/fatigue.  HENT: Negative for congestion.   Eyes: Negative for discharge.  Respiratory: Negative for shortness of breath.   Cardiovascular: Negative for  chest pain, palpitations and leg swelling.  Gastrointestinal: Positive for heartburn. Negative for nausea, abdominal pain and diarrhea.  Genitourinary: Negative for dysuria.  Musculoskeletal: Negative for falls.  Skin: Negative for rash.  Neurological: Negative for loss of consciousness and headaches.  Endo/Heme/Allergies: Negative for polydipsia.  Psychiatric/Behavioral: Negative for depression and suicidal ideas. The patient is not nervous/anxious and does not have insomnia.     Objective  BP 110/72 mmHg  Pulse 57  Temp(Src) 97.9 F (36.6 C) (Oral)  Resp 16  Wt 153 lb (69.4 kg)  SpO2 98%  Physical Exam  Physical Exam  Constitutional: He is  oriented to person, place, and time and well-developed, well-nourished, and in no distress. No distress.  HENT:  Head: Normocephalic and atraumatic.  Eyes: Conjunctivae are normal.  Neck: Neck supple. No thyromegaly present.  Cardiovascular: Normal rate, regular rhythm and normal heart sounds.   No murmur heard. Pulmonary/Chest: Effort normal and breath sounds normal. No respiratory distress.  Abdominal: He exhibits no distension and no mass. There is no tenderness.  Musculoskeletal: He exhibits no edema.  Neurological: He is alert and oriented to person, place, and time.  Skin: Skin is warm.  Psychiatric: Memory, affect and judgment normal.    Lab Results  Component Value Date   TSH 2.33 05/13/2014   Lab Results  Component Value Date   WBC 6.8 08/21/2014   HGB 14.4 08/21/2014   HCT 40.8 08/21/2014   MCV 89.7 08/21/2014   PLT 239 08/21/2014   Lab Results  Component Value Date   CREATININE 0.90 08/21/2014   BUN 12 08/21/2014   NA 136 08/21/2014   K 4.3 08/21/2014   CL 98 08/21/2014   CO2 32 08/21/2014   Lab Results  Component Value Date   ALT 32 08/21/2014   AST 29 08/21/2014   ALKPHOS 73 08/21/2014   BILITOT 0.8 08/21/2014   Lab Results  Component Value Date   CHOL 141 01/17/2014   Lab Results  Component  Value Date   HDL 63.30 01/17/2014   Lab Results  Component Value Date   LDLCALC 64 01/17/2014   Lab Results  Component Value Date   TRIG 68.0 01/17/2014   Lab Results  Component Value Date   CHOLHDL 2 01/17/2014     Assessment & Plan  Hypothyroidism On Levothyroxine, continue to monitor   Hyperlipidemia Tolerating statin, encouraged heart healthy diet, avoid trans fats, minimize simple carbs and saturated fats. Increase exercise as tolerated   Low testosterone Level normal on blood draw   GERD (gastroesophageal reflux disease) Avoid offending foods, start probiotics. Do not eat large meals in late evening and consider raising head of bed.

## 2014-09-09 LAB — LIPID PANEL
Cholesterol: 107 mg/dL (ref 0–200)
HDL: 40.3 mg/dL (ref >39.00)
LDL Cholesterol: 46 mg/dL (ref 0–99)
NonHDL: 66.7
Total CHOL/HDL Ratio: 3
Triglycerides: 106 mg/dL (ref 0.0–149.0)
VLDL: 21.2 mg/dL (ref 0.0–40.0)

## 2014-09-09 LAB — COMPREHENSIVE METABOLIC PANEL
ALBUMIN: 4.2 g/dL (ref 3.5–5.2)
ALT: 30 U/L (ref 0–53)
AST: 20 U/L (ref 0–37)
Alkaline Phosphatase: 88 U/L (ref 39–117)
BUN: 17 mg/dL (ref 6–23)
CALCIUM: 9.3 mg/dL (ref 8.4–10.5)
CHLORIDE: 101 meq/L (ref 96–112)
CO2: 28 meq/L (ref 19–32)
CREATININE: 0.92 mg/dL (ref 0.40–1.50)
GFR: 85.3 mL/min (ref >60.00)
Glucose, Bld: 94 mg/dL (ref 70–99)
POTASSIUM: 4.3 meq/L (ref 3.5–5.1)
Sodium: 135 mEq/L (ref 135–145)
Total Bilirubin: 0.5 mg/dL (ref 0.2–1.2)
Total Protein: 7.3 g/dL (ref 6.0–8.3)

## 2014-09-09 LAB — CBC
HCT: 41.5 % (ref 39.0–52.0)
HEMOGLOBIN: 14.6 g/dL (ref 13.0–17.0)
MCHC: 35.3 g/dL (ref 30.0–36.0)
MCV: 90.1 fl (ref 78.0–100.0)
Platelets: 225 10*3/uL (ref 150.0–400.0)
RBC: 4.61 Mil/uL (ref 4.22–5.81)
RDW: 13.2 % (ref 11.5–15.5)
WBC: 7.4 10*3/uL (ref 4.0–10.5)

## 2014-09-09 LAB — TSH: TSH: 0.48 u[IU]/mL (ref 0.35–4.50)

## 2014-09-09 LAB — TESTOSTERONE: TESTOSTERONE: 424.77 ng/dL (ref 300.00–890.00)

## 2014-09-09 LAB — SEDIMENTATION RATE: SED RATE: 16 mm/h (ref 0–22)

## 2014-09-18 ENCOUNTER — Other Ambulatory Visit: Payer: Self-pay | Admitting: Family Medicine

## 2014-09-18 DIAGNOSIS — K219 Gastro-esophageal reflux disease without esophagitis: Secondary | ICD-10-CM

## 2014-09-18 MED ORDER — DEXLANSOPRAZOLE 60 MG PO CPDR: 60.0000 mg | DELAYED_RELEASE_CAPSULE | Freq: Every day | ORAL | Status: DC

## 2014-09-18 NOTE — Assessment & Plan Note (Signed)
On Levothyroxine, continue to monitor 

## 2014-09-18 NOTE — Assessment & Plan Note (Signed)
Level normal on blood draw

## 2014-09-18 NOTE — Assessment & Plan Note (Signed)
Avoid offending foods, start probiotics. Do not eat large meals in late evening and consider raising head of bed.  

## 2014-09-18 NOTE — Assessment & Plan Note (Signed)
Tolerating statin, encouraged heart healthy diet, avoid trans fats, minimize simple carbs and saturated fats. Increase exercise as tolerated 

## 2014-10-16 ENCOUNTER — Other Ambulatory Visit: Payer: Self-pay | Admitting: Family Medicine

## 2014-10-16 MED ORDER — LEVOTHYROXINE SODIUM 100 MCG PO TABS: ORAL_TABLET | ORAL | Status: DC

## 2014-11-13 ENCOUNTER — Ambulatory Visit (INDEPENDENT_AMBULATORY_CARE_PROVIDER_SITE_OTHER): Payer: Medicare Other | Admitting: Family Medicine

## 2014-11-13 ENCOUNTER — Encounter: Payer: Self-pay | Admitting: Family Medicine

## 2014-11-13 VITALS — BP 120/80 | HR 54 | Temp 97.7°F | Ht 70.0 in | Wt 159.0 lb

## 2014-11-13 DIAGNOSIS — E291 Testicular hypofunction: Secondary | ICD-10-CM | POA: Diagnosis not present

## 2014-11-13 DIAGNOSIS — I10 Essential (primary) hypertension: Secondary | ICD-10-CM

## 2014-11-13 DIAGNOSIS — E785 Hyperlipidemia, unspecified: Secondary | ICD-10-CM

## 2014-11-13 DIAGNOSIS — G4700 Insomnia, unspecified: Secondary | ICD-10-CM

## 2014-11-13 DIAGNOSIS — E039 Hypothyroidism, unspecified: Secondary | ICD-10-CM

## 2014-11-13 DIAGNOSIS — R7989 Other specified abnormal findings of blood chemistry: Secondary | ICD-10-CM

## 2014-11-13 DIAGNOSIS — K5900 Constipation, unspecified: Secondary | ICD-10-CM

## 2014-11-13 DIAGNOSIS — K219 Gastro-esophageal reflux disease without esophagitis: Secondary | ICD-10-CM

## 2014-11-13 LAB — CBC
HCT: 41.9 % (ref 39.0–52.0)
HEMOGLOBIN: 14.1 g/dL (ref 13.0–17.0)
MCHC: 33.5 g/dL (ref 30.0–36.0)
MCV: 93.9 fl (ref 78.0–100.0)
Platelets: 208 10*3/uL (ref 150.0–400.0)
RBC: 4.46 Mil/uL (ref 4.22–5.81)
RDW: 13.7 % (ref 11.5–15.5)
WBC: 6.6 10*3/uL (ref 4.0–10.5)

## 2014-11-13 LAB — COMPREHENSIVE METABOLIC PANEL
ALBUMIN: 4.1 g/dL (ref 3.5–5.2)
ALK PHOS: 68 U/L (ref 39–117)
ALT: 24 U/L (ref 0–53)
AST: 23 U/L (ref 0–37)
BUN: 17 mg/dL (ref 6–23)
CALCIUM: 9.3 mg/dL (ref 8.4–10.5)
CO2: 30 mEq/L (ref 19–32)
Chloride: 101 mEq/L (ref 96–112)
Creatinine, Ser: 0.94 mg/dL (ref 0.40–1.50)
GFR: 83.17 mL/min (ref >60.00)
GLUCOSE: 88 mg/dL (ref 70–99)
Potassium: 4.1 mEq/L (ref 3.5–5.1)
Sodium: 136 mEq/L (ref 135–145)
TOTAL PROTEIN: 7.2 g/dL (ref 6.0–8.3)
Total Bilirubin: 0.7 mg/dL (ref 0.2–1.2)

## 2014-11-13 LAB — LIPID PANEL
Cholesterol: 138 mg/dL (ref 0–200)
HDL: 60.1 mg/dL (ref >39.00)
LDL Cholesterol: 63 mg/dL (ref 0–99)
NonHDL: 77.9
TRIGLYCERIDES: 77 mg/dL (ref 0.0–149.0)
Total CHOL/HDL Ratio: 2
VLDL: 15.4 mg/dL (ref 0.0–40.0)

## 2014-11-13 LAB — TSH: TSH: 1.29 u[IU]/mL (ref 0.35–4.50)

## 2014-11-13 LAB — TESTOSTERONE: Testosterone: 516 ng/dL (ref 300.00–890.00)

## 2014-11-13 MED ORDER — ZOLPIDEM TARTRATE 10 MG PO TABS: 5.0000 mg | ORAL_TABLET | Freq: Every evening | ORAL | Status: DC | PRN

## 2014-11-13 MED ORDER — MIRTAZAPINE 15 MG PO TABS: 15.0000 mg | ORAL_TABLET | Freq: Every day | ORAL | Status: DC

## 2014-11-13 MED ORDER — ATORVASTATIN CALCIUM 20 MG PO TABS: 20.0000 mg | ORAL_TABLET | Freq: Every day | ORAL | Status: DC

## 2014-11-13 MED ORDER — DUTASTERIDE-TAMSULOSIN HCL 0.5-0.4 MG PO CAPS: 1.0000 | ORAL_CAPSULE | Freq: Every day | ORAL | Status: DC

## 2014-11-13 MED ORDER — PANTOPRAZOLE SODIUM 40 MG PO TBEC: 40.0000 mg | DELAYED_RELEASE_TABLET | Freq: Every day | ORAL | Status: DC

## 2014-11-13 MED ORDER — TESTOSTERONE 30 MG/ACT TD SOLN: 1.0000 "application " | Freq: Every morning | TRANSDERMAL | Status: DC

## 2014-11-13 NOTE — Progress Notes (Signed)
Pre visit review using our clinic review tool, if applicable. No additional management support is needed unless otherwise documented below in the visit note. 

## 2014-11-13 NOTE — Patient Instructions (Signed)
Encouraged good sleep hygiene such as dark, quiet room. No blue/green glowing lights such as computer screens in bedroom. No alcohol or stimulants in evening. Cut down on caffeine as able. Regular exercise is helpful but not just prior to bed time.  Insomnia Insomnia is frequent trouble falling and/or staying asleep. Insomnia can be a long term problem or a short term problem. Both are common. Insomnia can be a short term problem when the wakefulness is related to a certain stress or worry. Long term insomnia is often related to ongoing stress during waking hours and/or poor sleeping habits. Overtime, sleep deprivation itself can make the problem worse. Every little thing feels more severe because you are overtired and your ability to cope is decreased. CAUSES   Stress, anxiety, and depression.  Poor sleeping habits.  Distractions such as TV in the bedroom.  Naps close to bedtime.  Engaging in emotionally charged conversations before bed.  Technical reading before sleep.  Alcohol and other sedatives. They may make the problem worse. They can hurt normal sleep patterns and normal dream activity.  Stimulants such as caffeine for several hours prior to bedtime.  Pain syndromes and shortness of breath can cause insomnia.  Exercise late at night.  Changing time zones may cause sleeping problems (jet lag). It is sometimes helpful to have someone observe your sleeping patterns. They should look for periods of not breathing during the night (sleep apnea). They should also look to see how long those periods last. If you live alone or observers are uncertain, you can also be observed at a sleep clinic where your sleep patterns will be professionally monitored. Sleep apnea requires a checkup and treatment. Give your caregivers your medical history. Give your caregivers observations your family has made about your sleep.  SYMPTOMS   Not feeling rested in the morning.  Anxiety and restlessness at  bedtime.  Difficulty falling and staying asleep. TREATMENT   Your caregiver may prescribe treatment for an underlying medical disorders. Your caregiver can give advice or help if you are using alcohol or other drugs for self-medication. Treatment of underlying problems will usually eliminate insomnia problems.  Medications can be prescribed for short time use. They are generally not recommended for lengthy use.  Over-the-counter sleep medicines are not recommended for lengthy use. They can be habit forming.  You can promote easier sleeping by making lifestyle changes such as:  Using relaxation techniques that help with breathing and reduce muscle tension.  Exercising earlier in the day.  Changing your diet and the time of your last meal. No night time snacks.  Establish a regular time to go to bed.  Counseling can help with stressful problems and worry.  Soothing music and white noise may be helpful if there are background noises you cannot remove.  Stop tedious detailed work at least one hour before bedtime. HOME CARE INSTRUCTIONS   Keep a diary. Inform your caregiver about your progress. This includes any medication side effects. See your caregiver regularly. Take note of:  Times when you are asleep.  Times when you are awake during the night.  The quality of your sleep.  How you feel the next day. This information will help your caregiver care for you.  Get out of bed if you are still awake after 15 minutes. Read or do some quiet activity. Keep the lights down. Wait until you feel sleepy and go back to bed.  Keep regular sleeping and waking hours. Avoid naps.  Exercise regularly.  Avoid distractions at bedtime. Distractions include watching television or engaging in any intense or detailed activity like attempting to balance the household checkbook.  Develop a bedtime ritual. Keep a familiar routine of bathing, brushing your teeth, climbing into bed at the same time  each night, listening to soothing music. Routines increase the success of falling to sleep faster.  Use relaxation techniques. This can be using breathing and muscle tension release routines. It can also include visualizing peaceful scenes. You can also help control troubling or intruding thoughts by keeping your mind occupied with boring or repetitive thoughts like the old concept of counting sheep. You can make it more creative like imagining planting one beautiful flower after another in your backyard garden.  During your day, work to eliminate stress. When this is not possible use some of the previous suggestions to help reduce the anxiety that accompanies stressful situations. MAKE SURE YOU:   Understand these instructions.  Will watch your condition.  Will get help right away if you are not doing well or get worse. Document Released: 06/03/2000 Document Revised: 08/29/2011 Document Reviewed: 07/04/2007 [facility] Patient Information 2015 [place], [place]. This information is not intended to replace advice given to you by your health care provider. Make sure you discuss any questions you have with your health care provider.

## 2014-11-23 ENCOUNTER — Encounter: Payer: Self-pay | Admitting: Family Medicine

## 2014-11-23 NOTE — Assessment & Plan Note (Signed)
On Levothyroxine, continue to monitor 

## 2014-11-23 NOTE — Progress Notes (Signed)
James [name]  [id number] 06-30-1940 11/23/2014      Progress Note-Follow Up  Subjective  Chief Complaint  Chief Complaint  Patient presents with  . Follow-up    HPI  Patient is a 74 y.o. male in today for routine medical care.  Denies CP/palp/SOB/HA/congestion/fevers/GI or GU c/o. Taking meds as prescribedHe is generally doing well. No recent illness. No acute concerns. Is doing well at home and trying to maintain a heart healthy diet.  Past Medical History  Diagnosis Date  . CAD (coronary artery disease)     nonobstructive by cath 2008; nonobstructive by Cardiac CT 11/2011  . Hypothyroidism   . Atrial fibrillation   . Diverticulosis   . Mitral valve prolapse   . HLD (hyperlipidemia)   . GERD (gastroesophageal reflux disease)   . Hiatal hernia   . Cervical spine fracture 1980    C6-7  . 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 History of squamous cell carcinoma 10-05-10    Dr [name], [place]  . 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  . Syncope 02/03/2013  . Benign paroxysmal positional vertigo 04/06/2013  . History of atrial fibrillation   . AAA (abdominal aortic aneurysm) without rupture 09/08/2013  . Erectile dysfunction 09/08/2013  . Preventative health care 03/16/2014  . Medicare annual wellness visit, subsequent 03/16/2014    Sees [facility] Follows with Dr Johnsie Cancel of cardiology Sees [facility] for colonoscopy, last colonoscopy in 2013   . Anxiety state 05/18/2014  . 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 Appendectomy  05/2011  . Inguinal hernia repair  07/2010    right  . Knee arthroplasty      bilat  . Cardiac catheterization  01/03/12    30% ostial LAD stenosis, ostial 30-40% D1 stenosis, smooth and normal left main and RCA; LVEF 65%  . Tonsillectomy  1947  . Tumor removal  1958    "fatty tumor"  . Left heart catheterization with coronary angiogram N/A 01/03/2012      Procedure: LEFT HEART CATHETERIZATION WITH CORONARY ANGIOGRAM;  Surgeon: [name], MD;  Location[facility] CATH LAB;  Service: Cardiovascular;  Laterality: N/A;  . Cholecystectomy N/A 08/25/2014    Procedure: LAPAROSCOPIC CHOLECYSTECTOMY WITH INTRAOPERATIVE CHOLANGIOGRAM;  Surgeon: [name], MD;  Location: [facility] OR;  Service: General;  Laterality: N/A;    Family History  Problem Relation Age of Onset  . Heart disease Mother     MI 75s  . Stroke Mother   . Hypertension Mother   . Heart disease Maternal Grandfather     MI 70-80s  . Heart disease Maternal Grandmother     MI 70-80s  . Lung disease Father     penumonitis  . Cancer Sister     colon  . Cancer Paternal Uncle     GI: colon he thinks    History   Social History  . Marital Status: Single    Spouse Name: N/A  . Number of Children: N/A  . Years of Education: N/A   Occupational History  . Chief Financial Officer     Retired   Social History Main Topics  . Smoking status: Never Smoker   . Smokeless tobacco: Not on file  . Alcohol Use: Yes     Comment: 2 glasses red wine/day  . Drug Use: No  . Sexual Activity: Yes   Other Topics Concern  . Not on  file   Social History Narrative    Current Outpatient Prescriptions on File Prior to Visit  Medication Sig Dispense Refill  . aspirin EC 81 MG tablet Take 81 mg by mouth daily.    . Calcium Carb-Cholecalciferol (CALCIUM 1000 + D PO) Take 1 tablet by mouth daily.    Marland Kitchen levothyroxine (SYNTHROID, LEVOTHROID) 100 MCG tablet TAKE 1 TABLET (100 MCG TOTAL) BY MOUTH DAILY BEFORE BREAKFAST. 30 tablet 6  . LORazepam (ATIVAN) 0.5 MG tablet Take 1 tablet (0.5 mg total) by mouth 2 (two) times daily as needed for anxiety. 30 tablet 0  . Milk Thistle 1000 MG CAPS Take 1,000 mg by mouth daily.    . Multiple Vitamins-Minerals (MULTIVITAMINS THER. W/MINERALS) TABS Take 1 tablet by mouth daily.    . Probiotic Product (PROBIOTIC DAILY PO) Take by mouth.    . sildenafil (VIAGRA) 100 MG tablet  Take 1 tablet (100 mg total) by mouth daily as needed for erectile dysfunction. 10 tablet 5  . meclizine (ANTIVERT) 25 MG tablet Take 1 tablet (25 mg total) by mouth 2 (two) times daily as needed for dizziness or nausea. (Patient not taking: Reported on 11/13/2014) 30 tablet 2  . nitroGLYCERIN (NITROSTAT) 0.4 MG SL tablet Place 1 tablet (0.4 mg total) under the tongue every 5 (five) minutes x 3 doses as needed for chest pain. (Patient not taking: Reported on 11/13/2014) 25 tablet 3   No current facility-administered medications on file prior to visit.    Allergies  Allergen Reactions  . Sulfa Antibiotics Other (See Comments)    Childhood allergy    Review of Systems  Review of Systems  Constitutional: Negative for fever and malaise/fatigue.  HENT: Negative for congestion.   Eyes: Negative for discharge.  Respiratory: Negative for shortness of breath.   Cardiovascular: Negative for chest pain, palpitations and leg swelling.  Gastrointestinal: Negative for nausea, abdominal pain and diarrhea.  Genitourinary: Negative for dysuria.  Musculoskeletal: Negative for falls.  Skin: Negative for rash.  Neurological: Negative for loss of consciousness and headaches.  Endo/Heme/Allergies: Negative for polydipsia.  Psychiatric/Behavioral: Negative for depression and suicidal ideas. The patient is not nervous/anxious and does not have insomnia.     Objective  BP 120/80 mmHg  Pulse 54  Temp(Src) 97.7 F (36.5 C) (Oral)  Ht 5\' 10"  (1.778 m)  Wt 159 lb (72.122 kg)  BMI 22.81 kg/m2  SpO2 97%  Physical Exam  Physical Exam  Constitutional: He is oriented to person, place, and time and well-developed, well-nourished, and in no distress. No distress.  HENT:  Head: Normocephalic and atraumatic.  Eyes: Conjunctivae are normal.  Neck: Neck supple. No thyromegaly present.  Cardiovascular: Normal rate, regular rhythm and normal heart sounds.   No murmur heard. Pulmonary/Chest: Effort normal and  breath sounds normal. No respiratory distress.  Abdominal: He exhibits no distension and no mass. There is no tenderness.  Musculoskeletal: He exhibits no edema.  Neurological: He is alert and oriented to person, place, and time.  Skin: Skin is warm.  Psychiatric: Memory, affect and judgment normal.    Lab Results  Component Value Date   TSH 1.29 11/13/2014   Lab Results  Component Value Date   WBC 6.6 11/13/2014   HGB 14.1 11/13/2014   HCT 41.9 11/13/2014   MCV 93.9 11/13/2014   PLT 208.0 11/13/2014   Lab Results  Component Value Date   CREATININE 0.94 11/13/2014   BUN 17 11/13/2014   NA 136 11/13/2014   K 4.1 11/13/2014  CL 101 11/13/2014   CO2 30 11/13/2014   Lab Results  Component Value Date   ALT 24 11/13/2014   AST 23 11/13/2014   ALKPHOS 68 11/13/2014   BILITOT 0.7 11/13/2014   Lab Results  Component Value Date   CHOL 138 11/13/2014   Lab Results  Component Value Date   HDL 60.10 11/13/2014   Lab Results  Component Value Date   LDLCALC 63 11/13/2014   Lab Results  Component Value Date   TRIG 77.0 11/13/2014   Lab Results  Component Value Date   CHOLHDL 2 11/13/2014     Assessment & Plan  Constipation Encouraged increased hydration and fiber in diet. Daily probiotics. If bowels not moving can use MOM 2 tbls po in 4 oz of warm prune juice by mouth every 2-3 days. If no results then repeat in 4 hours with  Dulcolax suppository pr, may repeat again in 4 more hours as needed. Seek care if symptoms worsen. Consider daily Miralax and/or Dulcolax if symptoms persist.    GERD (gastroesophageal reflux disease) Avoid offending foods, start probiotics. Do not eat large meals in late evening and consider raising head of bed.    Hypothyroidism On Levothyroxine, continue to monitor   Hyperlipidemia Encouraged heart healthy diet, increase exercise, avoid trans fats, consider a krill oil cap daily. Continue Atorvastatin   Insomnia Encouraged good  sleep hygiene such as dark, quiet room. No blue/green glowing lights such as computer screens in bedroom. No alcohol or stimulants in evening. Cut down on caffeine as able. Regular exercise is helpful but not just prior to bed time.

## 2014-11-23 NOTE — Assessment & Plan Note (Signed)
Encouraged good sleep hygiene such as dark, quiet room. No blue/green glowing lights such as computer screens in bedroom. No alcohol or stimulants in evening. Cut down on caffeine as able. Regular exercise is helpful but not just prior to bed time.  

## 2014-11-23 NOTE — Assessment & Plan Note (Signed)
Encouraged increased hydration and fiber in diet. Daily probiotics. If bowels not moving can use MOM 2 tbls po in 4 oz of warm prune juice by mouth every 2-3 days. If no results then repeat in 4 hours with  Dulcolax suppository pr, may repeat again in 4 more hours as needed. Seek care if symptoms worsen. Consider daily Miralax and/or Dulcolax if symptoms persist.  

## 2014-11-23 NOTE — Assessment & Plan Note (Signed)
Avoid offending foods, start probiotics. Do not eat large meals in late evening and consider raising head of bed.  

## 2014-11-23 NOTE — Assessment & Plan Note (Signed)
Encouraged heart healthy diet, increase exercise, avoid trans fats, consider a krill oil cap daily. Continue Atorvastatin

## 2014-12-04 DIAGNOSIS — Z85828 Personal history of other malignant neoplasm of skin: Secondary | ICD-10-CM | POA: Diagnosis not present

## 2014-12-04 DIAGNOSIS — Z08 Encounter for follow-up examination after completed treatment for malignant neoplasm: Secondary | ICD-10-CM | POA: Diagnosis not present

## 2014-12-09 ENCOUNTER — Other Ambulatory Visit: Payer: Self-pay | Admitting: Family Medicine

## 2014-12-09 MED ORDER — LEVOTHYROXINE SODIUM 100 MCG PO TABS: ORAL_TABLET | ORAL | Status: DC

## 2015-03-09 ENCOUNTER — Other Ambulatory Visit: Payer: Self-pay | Admitting: Family Medicine

## 2015-03-09 NOTE — Telephone Encounter (Signed)
Advise on refill has not been filled since 09/03/13. Last OFfice visit 11/13/14

## 2015-03-27 ENCOUNTER — Other Ambulatory Visit: Payer: Self-pay | Admitting: Family Medicine

## 2015-03-27 NOTE — Telephone Encounter (Signed)
Faxed hardcopy for Axiron to [facility].

## 2015-04-23 ENCOUNTER — Ambulatory Visit (INDEPENDENT_AMBULATORY_CARE_PROVIDER_SITE_OTHER): Payer: Medicare Other | Admitting: Family Medicine

## 2015-04-23 ENCOUNTER — Ambulatory Visit (HOSPITAL_BASED_OUTPATIENT_CLINIC_OR_DEPARTMENT_OTHER)
Admission: RE | Admit: 2015-04-23 | Discharge: 2015-04-23 | Disposition: A | Discharge status: Home / Self Care | Payer: Medicare Other | Source: Ambulatory Visit | Attending: Family Medicine | Admitting: Family Medicine

## 2015-04-23 ENCOUNTER — Encounter: Payer: Self-pay | Admitting: Family Medicine

## 2015-04-23 VITALS — BP 108/72 | HR 54 | Temp 97.9°F | Ht 70.0 in | Wt 161.4 lb

## 2015-04-23 DIAGNOSIS — I2583 Coronary atherosclerosis due to lipid rich plaque: Secondary | ICD-10-CM

## 2015-04-23 DIAGNOSIS — E785 Hyperlipidemia, unspecified: Secondary | ICD-10-CM

## 2015-04-23 DIAGNOSIS — M50322 Other cervical disc degeneration at C5-C6 level: Secondary | ICD-10-CM | POA: Insufficient documentation

## 2015-04-23 DIAGNOSIS — Z0000 Encounter for general adult medical examination without abnormal findings: Secondary | ICD-10-CM

## 2015-04-23 DIAGNOSIS — H547 Unspecified visual loss: Secondary | ICD-10-CM

## 2015-04-23 DIAGNOSIS — F419 Anxiety disorder, unspecified: Principal | ICD-10-CM

## 2015-04-23 DIAGNOSIS — K5792 Diverticulitis of intestine, part unspecified, without perforation or abscess without bleeding: Secondary | ICD-10-CM | POA: Diagnosis not present

## 2015-04-23 DIAGNOSIS — F329 Major depressive disorder, single episode, unspecified: Secondary | ICD-10-CM

## 2015-04-23 DIAGNOSIS — M50323 Other cervical disc degeneration at C6-C7 level: Secondary | ICD-10-CM | POA: Insufficient documentation

## 2015-04-23 DIAGNOSIS — K5730 Diverticulosis of large intestine without perforation or abscess without bleeding: Secondary | ICD-10-CM

## 2015-04-23 DIAGNOSIS — M5033 Other cervical disc degeneration, cervicothoracic region: Secondary | ICD-10-CM | POA: Diagnosis not present

## 2015-04-23 DIAGNOSIS — M4802 Spinal stenosis, cervical region: Secondary | ICD-10-CM | POA: Insufficient documentation

## 2015-04-23 DIAGNOSIS — K5900 Constipation, unspecified: Secondary | ICD-10-CM

## 2015-04-23 DIAGNOSIS — G4700 Insomnia, unspecified: Secondary | ICD-10-CM

## 2015-04-23 DIAGNOSIS — M542 Cervicalgia: Secondary | ICD-10-CM | POA: Diagnosis present

## 2015-04-23 DIAGNOSIS — Z23 Encounter for immunization: Secondary | ICD-10-CM

## 2015-04-23 DIAGNOSIS — Z125 Encounter for screening for malignant neoplasm of prostate: Secondary | ICD-10-CM | POA: Diagnosis not present

## 2015-04-23 DIAGNOSIS — M5030 Other cervical disc degeneration, unspecified cervical region: Secondary | ICD-10-CM | POA: Diagnosis not present

## 2015-04-23 DIAGNOSIS — F418 Other specified anxiety disorders: Secondary | ICD-10-CM | POA: Diagnosis not present

## 2015-04-23 DIAGNOSIS — K219 Gastro-esophageal reflux disease without esophagitis: Secondary | ICD-10-CM

## 2015-04-23 DIAGNOSIS — I2510 Atherosclerotic heart disease of native coronary artery without angina pectoris: Secondary | ICD-10-CM | POA: Diagnosis not present

## 2015-04-23 DIAGNOSIS — E039 Hypothyroidism, unspecified: Secondary | ICD-10-CM | POA: Diagnosis not present

## 2015-04-23 DIAGNOSIS — F32A Depression, unspecified: Secondary | ICD-10-CM

## 2015-04-23 MED ORDER — MIRTAZAPINE 15 MG PO TABS: 15.0000 mg | ORAL_TABLET | Freq: Every day | ORAL | Status: DC

## 2015-04-23 MED ORDER — ZOLPIDEM TARTRATE 10 MG PO TABS: 5.0000 mg | ORAL_TABLET | Freq: Every evening | ORAL | Status: DC | PRN

## 2015-04-23 MED ORDER — TESTOSTERONE 30 MG/ACT TD SOLN: TRANSDERMAL | Status: DC

## 2015-04-23 MED ORDER — PANTOPRAZOLE SODIUM 40 MG PO TBEC: 40.0000 mg | DELAYED_RELEASE_TABLET | Freq: Every day | ORAL | Status: DC

## 2015-04-23 MED ORDER — LORAZEPAM 0.5 MG PO TABS: 0.5000 mg | ORAL_TABLET | Freq: Two times a day (BID) | ORAL | Status: DC | PRN

## 2015-04-23 MED ORDER — DUTASTERIDE-TAMSULOSIN HCL 0.5-0.4 MG PO CAPS: 1.0000 | ORAL_CAPSULE | Freq: Every day | ORAL | Status: DC

## 2015-04-23 NOTE — Patient Instructions (Addendum)
Recommend incorporating a mixture of light weight lifting at the gym in addition to your cardio exercises during the week Movement is good for the pain you are experiencing in your thumbs. 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 Salonpas topical gel can also help alleviate pain. Mix in Benefiber into your water in the morning. It is odorless and tasteless and will help bulk up your loose stool. You may also want to consider trying a new probiotic with different strains. Two recommendations: Digestive Advangtage or [place]  Osteoarthritis Osteoarthritis is a disease that causes soreness and inflammation of a joint. It occurs when the cartilage at the affected joint wears down. Cartilage acts as a cushion, covering the ends of bones where they meet to form a joint. Osteoarthritis is the most common form of arthritis. It often occurs in older people. The joints affected most often by this condition include those in the:  Ends of the fingers.  Thumbs.  Neck.  Lower back.  Knees.  Hips. CAUSES  Over time, the cartilage that covers the ends of bones begins to wear away. This causes bone to rub on bone, producing pain and stiffness in the affected joints.  RISK FACTORS Certain factors can increase your chances of having osteoarthritis, including:  Older age.  Excessive body weight.  Overuse of joints.  Previous joint injury. SIGNS AND SYMPTOMS   Pain, swelling, and stiffness in the joint.  Over time, the joint may lose its normal shape.  Small deposits of bone (osteophytes) may grow on the edges of the joint.  Bits of bone or cartilage can break off and float inside the joint space. This may cause more pain and damage. DIAGNOSIS  Your health care provider will do a physical exam and ask about your symptoms. Various tests may be ordered, such as:  X-rays of the affected joint.  Blood tests to rule out other types of arthritis. Additional tests may be  used to diagnose your condition. TREATMENT  Goals of treatment are to control pain and improve joint function. Treatment plans may include:  A prescribed exercise program that allows for rest and joint relief.  A weight control plan.  Pain relief techniques, such as:  Properly applied heat and cold.  Electric pulses delivered to nerve endings under the skin (transcutaneous electrical nerve stimulation [TENS]).  Massage.  Certain nutritional supplements.  Medicines to control pain, such as:  Acetaminophen.  Nonsteroidal anti-inflammatory drugs (NSAIDs), such as naproxen.  Narcotic or central-acting agents, such as tramadol.  Corticosteroids. These can be given orally or as an injection.  Surgery to reposition the bones and relieve pain (osteotomy) or to remove loose pieces of bone and cartilage. Joint replacement may be needed in advanced states of osteoarthritis. HOME CARE INSTRUCTIONS   Take medicines only as directed by your health care provider.  Maintain a healthy weight. Follow your health care provider's instructions for weight control. This may include dietary instructions.  Exercise as directed. Your health care provider can recommend specific types of exercise. These may include:  Strengthening exercises. These are done to strengthen the muscles that support joints affected by arthritis. They can be performed with weights or with exercise bands to add resistance.  Aerobic activities. These are exercises, such as brisk walking or low-impact aerobics, that get your heart pumping.  Range-of-motion activities. These keep your joints limber.  Balance and agility exercises. These help you maintain daily living skills.  Rest your affected joints as directed by your  health care provider.  Keep all follow-up visits as directed by your health care provider. SEEK MEDICAL CARE IF:   Your skin turns red.  You develop a rash in addition to your joint pain.  You have  worsening joint pain.  You have a fever along with joint or muscle aches. SEEK IMMEDIATE MEDICAL CARE IF:  You have a significant loss of weight or appetite.  You have night sweats. [place] of Arthritis and Musculoskeletal and Skin Diseases: www.niams.SouthExposed.es  [employer] on Aging: http://kim-miller.com/  American College of Rheumatology: www.rheumatology.org   This information is not intended to replace advice given to you by your health care provider. Make sure you discuss any questions you have with your health care provider.   Document Released: 06/06/2005 Document Revised: 06/27/2014 Document Reviewed: 02/11/2013 Elsevier Interactive Patient Education 2016 [place] for Adults, Male A healthy lifestyle and preventive care can promote health and wellness. Preventive health guidelines for men include the following key practices:  A routine yearly physical is a good way to check with your health care provider about your health and preventative screening. It is a chance to share any concerns and updates on your health and to receive a thorough exam.  Visit your dentist for a routine exam and preventative care every 6 months. Brush your teeth twice a day and floss once a day. Good oral hygiene prevents tooth decay and gum disease.  The frequency of eye exams is based on your age, health, family medical history, use of contact lenses, and other factors. Follow your health care provider's recommendations for frequency of eye exams.  Eat a healthy diet. Foods such as vegetables, fruits, whole grains, low-fat dairy products, and lean protein foods contain the nutrients you need without too many calories. Decrease your intake of foods high in solid fats, added sugars, and salt. Eat the right amount of calories for you.Get information about a proper diet from your health care provider, if necessary.  Regular physical exercise is one  of the most important things you can do for your health. Most adults should get at least 150 minutes of moderate-intensity exercise (any activity that increases your heart rate and causes you to sweat) each week. In addition, most adults need muscle-strengthening exercises on 2 or more days a week.  Maintain a healthy weight. The body mass index (BMI) is a screening tool to identify possible weight problems. It provides an estimate of body fat based on height and weight. Your health care provider can find your BMI and can help you achieve or maintain a healthy weight.For adults 20 years and older:  A BMI below 18.5 is considered underweight.  A BMI of 18.5 to 24.9 is normal.  A BMI of 25 to 29.9 is considered overweight.  A BMI of 30 and above is considered obese.  Maintain normal blood lipids and cholesterol levels by exercising and minimizing your intake of saturated fat. Eat a balanced diet with plenty of fruit and vegetables. Blood tests for lipids and cholesterol should begin at age 70 and be repeated every 5 years. If your lipid or cholesterol levels are high, you are over 50, or you are at high risk for heart disease, you may need your cholesterol levels checked more frequently.Ongoing high lipid and cholesterol levels should be treated with medicines if diet and exercise are not working.  If you smoke, find out from your health care provider how to quit.  If you do not use tobacco, do not start.  Lung cancer screening is recommended for adults aged 75-80 years who are at high risk for developing lung cancer because of a history of smoking. A yearly low-dose CT scan of the lungs is recommended for people who have at least a 30-pack-year history of smoking and are a current smoker or have quit within the past 15 years. A pack year of smoking is smoking an average of 1 pack of cigarettes a day for 1 year (for example: 1 pack a day for 30 years or 2 packs a day for 15 years). Yearly screening  should continue until the smoker has stopped smoking for at least 15 years. Yearly screening should be stopped for people who develop a health problem that would prevent them from having lung cancer treatment.  If you choose to drink alcohol, do not have more than 2 drinks per day. One drink is considered to be 12 ounces (355 mL) of beer, 5 ounces (148 mL) of wine, or 1.5 ounces (44 mL) of liquor.  Avoid use of street drugs. Do not share needles with anyone. Ask for help if you need support or instructions about stopping the use of drugs.  High blood pressure causes heart disease and increases the risk of stroke. Your blood pressure should be checked at least every 1-2 years. Ongoing high blood pressure should be treated with medicines, if weight loss and exercise are not effective.  If you are 44-26 years old, ask your health care provider if you should take aspirin to prevent heart disease.  Diabetes screening is done by taking a blood sample to check your blood glucose level after you have not eaten for a certain period of time (fasting). If you are not overweight and you do not have risk factors for diabetes, you should be screened once every 3 years starting at age 37. If you are overweight or obese and you are 59-42 years of age, you should be screened for diabetes every year as part of your cardiovascular risk assessment.  Colorectal cancer can be detected and often prevented. Most routine colorectal cancer screening begins at the age of 38 and continues through age 36. However, your health care provider may recommend screening at an earlier age if you have risk factors for colon cancer. On a yearly basis, your health care provider may provide home test kits to check for hidden blood in the stool. Use of a small camera at the end of a tube to directly examine the colon (sigmoidoscopy or colonoscopy) can detect the earliest forms of colorectal cancer. Talk to your health care provider about this at  age 24, when routine screening begins. Direct exam of the colon should be repeated every 5-10 years through age 37, unless early forms of precancerous polyps or small growths are found.  People who are at an increased risk for hepatitis B should be screened for this virus. You are considered at high risk for hepatitis B if:  You were born in a country where hepatitis B occurs often. Talk with your health care provider about which countries are considered high risk.  Your parents were born in a high-risk country and you have not received a shot to protect against hepatitis B (hepatitis B vaccine).  You have HIV or AIDS.  You use needles to inject street drugs.  You live with, or have sex with, someone who has hepatitis B.  You are a man who has sex with  other men (MSM).  You get hemodialysis treatment.  You take certain medicines for conditions such as cancer, organ transplantation, and autoimmune conditions.  Hepatitis C blood testing is recommended for all people born from [birth year] through 1965 and any individual with known risks for hepatitis C.  Practice safe sex. Use condoms and avoid high-risk sexual practices to reduce the spread of sexually transmitted infections (STIs). STIs include gonorrhea, chlamydia, syphilis, trichomonas, herpes, HPV, and human immunodeficiency virus (HIV). Herpes, HIV, and HPV are viral illnesses that have no cure. They can result in disability, cancer, and death.  If you are a man who has sex with other men, you should be screened at least once per year for:  HIV.  Urethral, rectal, and pharyngeal infection of gonorrhea, chlamydia, or both.  If you are at risk of being infected with HIV, it is recommended that you take a prescription medicine daily to prevent HIV infection. This is called preexposure prophylaxis (PrEP). You are considered at risk if:  You are a man who has sex with other men (MSM) and have other risk factors.  You are a heterosexual man,  are sexually active, and are at increased risk for HIV infection.  You take drugs by injection.  You are sexually active with a partner who has HIV.  Talk with your health care provider about whether you are at high risk of being infected with HIV. If you choose to begin PrEP, you should first be tested for HIV. You should then be tested every 3 months for as long as you are taking PrEP.  A one-time screening for abdominal aortic aneurysm (AAA) and surgical repair of large AAAs by ultrasound are recommended for men ages 13 to 28 years who are current or former smokers.  Healthy men should no longer receive prostate-specific antigen (PSA) blood tests as part of routine cancer screening. Talk with your health care provider about prostate cancer screening.  Testicular cancer screening is not recommended for adult males who have no symptoms. Screening includes self-exam, a health care provider exam, and other screening tests. Consult with your health care provider about any symptoms you have or any concerns you have about testicular cancer.  Use sunscreen. Apply sunscreen liberally and repeatedly throughout the day. You should seek shade when your shadow is shorter than you. Protect yourself by wearing long sleeves, pants, a wide-brimmed hat, and sunglasses year round, whenever you are outdoors.  Once a month, do a whole-body skin exam, using a mirror to look at the skin on your back. Tell your health care provider about new moles, moles that have irregular borders, moles that are larger than a pencil eraser, or moles that have changed in shape or color.  Stay current with required vaccines (immunizations).  Influenza vaccine. All adults should be immunized every year.  Tetanus, diphtheria, and acellular pertussis (Td, Tdap) vaccine. An adult who has not previously received Tdap or who does not know his vaccine status should receive 1 dose of Tdap. This initial dose should be followed by tetanus  and diphtheria toxoids (Td) booster doses every 10 years. Adults with an unknown or incomplete history of completing a 3-dose immunization series with Td-containing vaccines should begin or complete a primary immunization series including a Tdap dose. Adults should receive a Td booster every 10 years.  Varicella vaccine. An adult without evidence of immunity to varicella should receive 2 doses or a second dose if he has previously received 1 dose.  Human papillomavirus (HPV) vaccine.  Males aged 11-21 years who have not received the vaccine previously should receive the 3-dose series. Males aged 22-26 years may be immunized. Immunization is recommended through the age of 50 years for any male who has sex with males and did not get any or all doses earlier. Immunization is recommended for any person with an immunocompromised condition through the age of 9 years if he did not get any or all doses earlier. During the 3-dose series, the second dose should be obtained 4-8 weeks after the first dose. The third dose should be obtained 24 weeks after the first dose and 16 weeks after the second dose.  Zoster vaccine. One dose is recommended for adults aged 75 years or older unless certain conditions are present.  Measles, mumps, and rubella (MMR) vaccine. Adults born before [birth year] generally are considered immune to measles and mumps. Adults born in [birth year] or later should have 1 or more doses of MMR vaccine unless there is a contraindication to the vaccine or there is laboratory evidence of immunity to each of the three diseases. A routine second dose of MMR vaccine should be obtained at least 28 days after the first dose for students attending postsecondary schools, health care workers, or international travelers. People who received inactivated measles vaccine or an unknown type of measles vaccine during 1963-1967 should receive 2 doses of MMR vaccine. People who received inactivated mumps vaccine or an unknown type of  mumps vaccine before 1979 and are at high risk for mumps infection should consider immunization with 2 doses of MMR vaccine. Unvaccinated health care workers born before [birth year] who lack laboratory evidence of measles, mumps, or rubella immunity or laboratory confirmation of disease should consider measles and mumps immunization with 2 doses of MMR vaccine or rubella immunization with 1 dose of MMR vaccine.  Pneumococcal 13-valent conjugate (PCV13) vaccine. When indicated, a person who is uncertain of his immunization history and has no record of immunization should receive the PCV13 vaccine. All adults 42 years of age and older should receive this vaccine. An adult aged 65 years or older who has certain medical conditions and has not been previously immunized should receive 1 dose of PCV13 vaccine. This PCV13 should be followed with a dose of pneumococcal polysaccharide (PPSV23) vaccine. Adults who are at high risk for pneumococcal disease should obtain the PPSV23 vaccine at least 8 weeks after the dose of PCV13 vaccine. Adults older than 75 years of age who have normal immune system function should obtain the PPSV23 vaccine dose at least 1 year after the dose of PCV13 vaccine.  Pneumococcal polysaccharide (PPSV23) vaccine. When PCV13 is also indicated, PCV13 should be obtained first. All adults aged 83 years and older should be immunized. An adult younger than age 27 years who has certain medical conditions should be immunized. Any person who resides in a nursing home or long-term care facility should be immunized. An adult smoker should be immunized. People with an immunocompromised condition and certain other conditions should receive both PCV13 and PPSV23 vaccines. People with human immunodeficiency virus (HIV) infection should be immunized as soon as possible after diagnosis. Immunization during chemotherapy or radiation therapy should be avoided. Routine use of PPSV23 vaccine is not recommended for [place]  [place]ians, [place] Natives, or people younger than 65 years unless there are medical conditions that require PPSV23 vaccine. When indicated, people who have unknown immunization and have no record of immunization should receive PPSV23 vaccine. One-time revaccination 5 years after the first dose of PPSV23  is recommended for people aged 19-64 years who have chronic kidney failure, nephrotic syndrome, asplenia, or immunocompromised conditions. People who received 1-2 doses of PPSV23 before age 20 years should receive another dose of PPSV23 vaccine at age 63 years or later if at least 5 years have passed since the previous dose. Doses of PPSV23 are not needed for people immunized with PPSV23 at or after age 15 years.  Meningococcal vaccine. Adults with asplenia or persistent complement component deficiencies should receive 2 doses of quadrivalent meningococcal conjugate (MenACWY-D) vaccine. The doses should be obtained at least 2 months apart. Microbiologists working with certain meningococcal bacteria, [place] recruits, people at risk during an outbreak, and people who travel to or live in countries with a high rate of meningitis should be immunized. A first-year college student up through age 74 years who is living in a residence hall should receive a dose if he did not receive a dose on or after his 16th birthday. Adults who have certain high-risk conditions should receive one or more doses of vaccine.  Hepatitis A vaccine. Adults who wish to be protected from this disease, have chronic liver disease, work with hepatitis A-infected animals, work in hepatitis A research labs, or travel to or work in countries with a high rate of hepatitis A should be immunized. Adults who were previously unvaccinated and who anticipate close contact with an international adoptee during the first 60 days after arrival in the [place] from a country with a high rate of hepatitis A should be immunized.  Hepatitis B vaccine.  Adults should be immunized if they wish to be protected from this disease, are under age 73 years and have diabetes, have chronic liver disease, have had more than one sex partner in the past 6 months, may be exposed to blood or other infectious body fluids, are household contacts or sex partners of hepatitis B positive people, are clients or workers in certain care facilities, or travel to or work in countries with a high rate of hepatitis B.  Haemophilus influenzae type b (Hib) vaccine. A previously unvaccinated person with asplenia or sickle cell disease or having a scheduled splenectomy should receive 1 dose of Hib vaccine. Regardless of previous immunization, a recipient of a hematopoietic stem cell transplant should receive a 3-dose series 6-12 months after his successful transplant. Hib vaccine is not recommended for adults with HIV infection. Preventive Service / Frequency Ages 59 and over  Blood pressure check.** / Every year.  Lipid and cholesterol check.**/ Every 5 years beginning at age 45.  Lung cancer screening. / Every year if you are aged 78-80 years and have a 30-pack-year history of smoking and currently smoke or have quit within the past 15 years. Yearly screening is stopped once you have quit smoking for at least 15 years or develop a health problem that would prevent you from having lung cancer treatment.  Fecal occult blood test (FOBT) of stool. / Every year beginning at age 76 and continuing until age 65. You may not have to do this test if you get a colonoscopy every 10 years.  Flexible sigmoidoscopy** or colonoscopy.** / Every 5 years for a flexible sigmoidoscopy or every 10 years for a colonoscopy beginning at age 74 and continuing until age 69.  Hepatitis C blood test.** / For all people born from [birth year] through 1965 and any individual with known risks for hepatitis C.  Abdominal aortic aneurysm (AAA) screening.** / A one-time screening for ages 21 to 43 years  who are  current or former smokers.  Skin self-exam. / Monthly.  Influenza vaccine. / Every year.  Tetanus, diphtheria, and acellular pertussis (Tdap/Td) vaccine.** / 1 dose of Td every 10 years.  Varicella vaccine.** / Consult your health care provider.  Zoster vaccine.** / 1 dose for adults aged 6 years or older.  Pneumococcal 13-valent conjugate (PCV13) vaccine.** / 1 dose for all adults aged 78 years and older.  Pneumococcal polysaccharide (PPSV23) vaccine.** / 1 dose for all adults aged 2 years and older.  Meningococcal vaccine.** / Consult your health care provider.  Hepatitis A vaccine.** / Consult your health care provider.  Hepatitis B vaccine.** / Consult your health care provider.  Haemophilus influenzae type b (Hib) vaccine.** / Consult your health care provider. **Family history and personal history of risk and conditions may change your health care provider's recommendations.   This information is not intended to replace advice given to you by your health care provider. Make sure you discuss any questions you have with your health care provider.   Document Released: 08/02/2001 Document Revised: 06/27/2014 Document Reviewed: 11/01/2010 Elsevier Interactive Patient Education [employer].

## 2015-04-23 NOTE — Progress Notes (Signed)
Pre visit review using our clinic review tool, if applicable. No additional management support is needed unless otherwise documented below in the visit note. 

## 2015-04-24 LAB — CBC
HEMATOCRIT: 43.5 % (ref 39.0–52.0)
HEMOGLOBIN: 14.3 g/dL (ref 13.0–17.0)
MCHC: 33 g/dL (ref 30.0–36.0)
MCV: 95.4 fl (ref 78.0–100.0)
Platelets: 230 10*3/uL (ref 150.0–400.0)
RBC: 4.56 Mil/uL (ref 4.22–5.81)
RDW: 13.7 % (ref 11.5–15.5)
WBC: 7.1 10*3/uL (ref 4.0–10.5)

## 2015-04-24 LAB — COMPREHENSIVE METABOLIC PANEL
ALT: 21 U/L (ref 0–53)
AST: 23 U/L (ref 0–37)
Albumin: 4.1 g/dL (ref 3.5–5.2)
Alkaline Phosphatase: 71 U/L (ref 39–117)
BUN: 19 mg/dL (ref 6–23)
CHLORIDE: 100 meq/L (ref 96–112)
CO2: 32 meq/L (ref 19–32)
Calcium: 9.6 mg/dL (ref 8.4–10.5)
Creatinine, Ser: 0.88 mg/dL (ref 0.40–1.50)
GFR: 89.64 mL/min (ref >60.00)
GLUCOSE: 96 mg/dL (ref 70–99)
POTASSIUM: 4.1 meq/L (ref 3.5–5.1)
SODIUM: 138 meq/L (ref 135–145)
Total Bilirubin: 0.7 mg/dL (ref 0.2–1.2)
Total Protein: 7.1 g/dL (ref 6.0–8.3)

## 2015-04-24 LAB — TSH: TSH: 1.45 u[IU]/mL (ref 0.35–4.50)

## 2015-04-24 LAB — LIPID PANEL
CHOL/HDL RATIO: 2
Cholesterol: 147 mg/dL (ref 0–200)
HDL: 65.3 mg/dL (ref >39.00)
LDL CALC: 58 mg/dL (ref 0–99)
NONHDL: 81.47
Triglycerides: 115 mg/dL (ref 0.0–149.0)
VLDL: 23 mg/dL (ref 0.0–40.0)

## 2015-04-24 LAB — PSA, MEDICARE: PSA: 0.51 ng/ml (ref 0.10–4.00)

## 2015-04-24 LAB — TESTOSTERONE: Testosterone: 541.59 ng/dL (ref 300.00–890.00)

## 2015-05-03 ENCOUNTER — Encounter: Payer: Self-pay | Admitting: Family Medicine

## 2015-05-03 DIAGNOSIS — M542 Cervicalgia: Secondary | ICD-10-CM

## 2015-05-03 HISTORY — DX: Cervicalgia: M54.2

## 2015-05-03 NOTE — Assessment & Plan Note (Signed)
Patient denies any difficulties at home. No trouble with ADLs, depression or falls. See EMR for functional status screen and depression screen. No recent changes to vision or hearing. Is UTD with immunizations. Is UTD with screening. Discussed Advanced Directives. Encouraged heart healthy diet, exercise as tolerated and adequate sleep. See patient's problem list for health risk factors to monitor. See AVS for preventative healthcare recommendation schedule.  Allergies verified: Yes  Immunization Status: Reviewed   Flu vaccine--Patient would like to have flu vaccine given at office visit  Tdap-- Due 10/15/19  PNA-- Completed  Shingles--Completed  CCS: Due 14-Oct-2021  [id number] dies not remember having  Care Teams Updated: [facility], [facility]  ED/Hospital/Urgent Care Visits: None  Updated insurance, contact information, forms: Advised to bring any new information and notify front desk staff upon arrival.  Remind to bring: DPR information, advance directives: Yes

## 2015-05-03 NOTE — Assessment & Plan Note (Signed)
Encouraged moist heat and gentle stretching as tolerated. May try NSAIDs and prescription meds as directed and report if symptoms worsen or seek immediate care. Xray confirms arthritic changes. Try topical changes

## 2015-05-03 NOTE — Assessment & Plan Note (Signed)
Avoid offending foods, start probiotics. Do not eat large meals in late evening and consider raising head of bed.  

## 2015-05-03 NOTE — Assessment & Plan Note (Signed)
On Levothyroxine, continue to monitor 

## 2015-05-03 NOTE — Assessment & Plan Note (Signed)
Tolerating statin, encouraged heart healthy diet, avoid trans fats, minimize simple carbs and saturated fats. Increase exercise as tolerated 

## 2015-05-03 NOTE — Progress Notes (Signed)
Subjective:    Patient ID: James [name], male    DOB: 05/01/1941, 74 y.o.   MRN: [id number]  Chief Complaint  Patient presents with  . Annual Exam    HPI Patient is in today for annual wellness exam and follow-up. Had cholecystectomy earlier this year but has generally improved. Does continue to have a loose stool many mornings but no bloody or tarry stool. No significant abdominal pain. No fevers or chills. Has been noting some left great toe pain but no injury swelling or warmth. Also complains of crepitus and stiffness in his neck. Does note she had a fracture years ago while running a marathon but has managed fairly well since then. No other recent illness or acute concerns. Continues to manage his activities  Denies CP/palp/SOB/HA/congestion/fevers/GI or GU c/o. Taking meds as prescribeddaily living well at home.  Past Medical History  Diagnosis Date  . CAD (coronary artery disease)     nonobstructive by cath 2008; nonobstructive by Cardiac CT 11/2011  . Hypothyroidism   . Atrial fibrillation ([place])   . Diverticulosis   . Mitral valve prolapse   . HLD (hyperlipidemia)   . GERD (gastroesophageal reflux disease)   . Hiatal hernia   . Cervical spine fracture ([facility]) 1980    C6-7  . 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 History of squamous cell carcinoma 10-05-10    Dr [name], [place]  . 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  . Syncope 02/03/2013  . Benign paroxysmal positional vertigo 04/06/2013  . History of atrial fibrillation   . AAA (abdominal aortic aneurysm) without rupture ([place]) 09/08/2013  . Erectile dysfunction 09/08/2013  . Preventative health care 03/16/2014  . Medicare annual wellness visit, subsequent 03/16/2014    Sees [facility] Follows with Dr Johnsie Cancel of cardiology Sees [facility] for colonoscopy, last colonoscopy in 2013   . Anxiety state 05/18/2014  . 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 Neck pain 05/03/2015     Past Surgical History  Procedure Laterality Date  . Appendectomy  05/2011  . Inguinal hernia repair  07/2010    right  . Knee arthroplasty      bilat  . Cardiac catheterization  01/03/12    30% ostial LAD stenosis, ostial 30-40% D1 stenosis, smooth and normal left main and RCA; LVEF 65%  . Tonsillectomy  1947  . Tumor removal  1958    "fatty tumor"  . Left heart catheterization with coronary angiogram N/A 01/03/2012    Procedure: LEFT HEART CATHETERIZATION WITH CORONARY ANGIOGRAM;  Surgeon: [name], MD;  Location: [facility] CATH LAB;  Service: Cardiovascular;  Laterality: N/A;  . Cholecystectomy N/A 08/25/2014    Procedure: LAPAROSCOPIC CHOLECYSTECTOMY WITH INTRAOPERATIVE CHOLANGIOGRAM;  Surgeon: [name], MD;  Location: [facility] OR;  Service: General;  Laterality: N/A;    Family History  Problem Relation Age of Onset  . Heart disease Mother     MI 87s  . Stroke Mother   . Hypertension Mother   . Heart disease Maternal Grandfather     MI 70-80s  . Heart disease Maternal Grandmother     MI 70-80s  . Lung disease Father     penumonitis  . Cancer Sister     colon  . Cancer Paternal Uncle     GI: colon he thinks    Social History   Social History  . Marital Status: Single    Spouse Name: N/A  . Number  of Children: N/A  . Years of Education: N/A   Occupational History  . Chief Financial Officer     Retired   Social History Main Topics  . Smoking status: Never Smoker   . Smokeless tobacco: Not on file  . Alcohol Use: Yes     Comment: 2 glasses red wine/day  . Drug Use: No  . Sexual Activity: Yes   Other Topics Concern  . Not on file   Social History Narrative    Outpatient Prescriptions Prior to Visit  Medication Sig Dispense Refill  . aspirin EC 81 MG tablet Take 81 mg by mouth daily.    Marland Kitchen atorvastatin (LIPITOR) 20 MG tablet Take 1 tablet (20 mg total) by mouth daily at 6 PM. 90 tablet 2  . Calcium Carb-Cholecalciferol (CALCIUM 1000 + D PO) Take 1 tablet by mouth  daily.    Marland Kitchen levothyroxine (SYNTHROID, LEVOTHROID) 100 MCG tablet TAKE 1 TABLET (100 MCG TOTAL) BY MOUTH DAILY BEFORE BREAKFAST. 30 tablet 6  . Milk Thistle 1000 MG CAPS Take 1,000 mg by mouth daily.    . Multiple Vitamins-Minerals (MULTIVITAMINS THER. W/MINERALS) TABS Take 1 tablet by mouth daily.    . Probiotic Product (PROBIOTIC DAILY PO) Take by mouth.    Marland Kitchen VIAGRA 100 MG tablet TAKE 1 TABLET BY MOUTH EVERY DAY AS NEEDED 10 tablet 0  . AXIRON 30 MG/ACT SOLN APPLY 1 PUMP UNDER EACH ARM EVERY MORNING AS DIRECTED 90 mL 1  . Dutasteride-Tamsulosin HCl (JALYN) 0.5-0.4 MG CAPS Take 1 capsule by mouth at bedtime. 90 capsule 2  . mirtazapine (REMERON) 15 MG tablet Take 1 tablet (15 mg total) by mouth at bedtime. 90 tablet 1  . pantoprazole (PROTONIX) 40 MG tablet Take 1 tablet (40 mg total) by mouth daily. 90 tablet 1  . zolpidem (AMBIEN) 10 MG tablet Take 0.5-1 tablets (5-10 mg total) by mouth at bedtime as needed for sleep. 30 tablet 5  . meclizine (ANTIVERT) 25 MG tablet Take 1 tablet (25 mg total) by mouth 2 (two) times daily as needed for dizziness or nausea. (Patient not taking: Reported on 11/13/2014) 30 tablet 2  . nitroGLYCERIN (NITROSTAT) 0.4 MG SL tablet Place 1 tablet (0.4 mg total) under the tongue every 5 (five) minutes x 3 doses as needed for chest pain. (Patient not taking: Reported on 04/23/2015) 25 tablet 3  . LORazepam (ATIVAN) 0.5 MG tablet Take 1 tablet (0.5 mg total) by mouth 2 (two) times daily as needed for anxiety. (Patient not taking: Reported on 04/23/2015) 30 tablet 0   No facility-administered medications prior to visit.    Allergies  Allergen Reactions  . Sulfa Antibiotics Other (See Comments)    Childhood allergy    Review of Systems  Constitutional: Negative for fever and malaise/fatigue.  HENT: Negative for congestion.   Eyes: Negative for discharge.  Respiratory: Negative for shortness of breath.   Cardiovascular: Negative for chest pain, palpitations and leg  swelling.  Gastrointestinal: Negative for nausea and abdominal pain.  Genitourinary: Negative for dysuria.  Musculoskeletal: Positive for back pain, joint pain and neck pain. Negative for falls.  Skin: Negative for rash.  Neurological: Negative for loss of consciousness and headaches.  Endo/Heme/Allergies: Negative for environmental allergies.  Psychiatric/Behavioral: Negative for depression. The patient is not nervous/anxious.        Objective:    Physical Exam  Constitutional: He is oriented to person, place, and time. He appears well-developed and well-nourished. No distress.  HENT:  Head: Normocephalic and atraumatic.  Eyes: Conjunctivae are normal.  Neck: Neck supple. No thyromegaly present.  Cardiovascular: Normal rate, regular rhythm and normal heart sounds.   No murmur heard. Pulmonary/Chest: Effort normal and breath sounds normal. No respiratory distress. He has no wheezes.  Abdominal: Soft. Bowel sounds are normal. He exhibits no mass. There is no tenderness.  Musculoskeletal: He exhibits no edema.  Lymphadenopathy:    He has no cervical adenopathy.  Neurological: He is alert and oriented to person, place, and time.  Skin: Skin is warm and dry.  Psychiatric: He has a normal mood and affect. His behavior is normal.    BP 108/72 mmHg  Pulse 54  Temp(Src) 97.9 F (36.6 C) (Oral)  Ht 5\' 10"  (1.778 m)  Wt 161 lb 6 oz (73.199 kg)  BMI 23.15 kg/m2  SpO2 97% Wt Readings from Last 3 Encounters:  04/23/15 161 lb 6 oz (73.199 kg)  11/13/14 159 lb (72.122 kg)  09/08/14 153 lb (69.4 kg)     Lab Results  Component Value Date   WBC 7.1 04/23/2015   HGB 14.3 04/23/2015   HCT 43.5 04/23/2015   PLT 230.0 04/23/2015   GLUCOSE 96 04/23/2015   CHOL 147 04/23/2015   TRIG 115.0 04/23/2015   HDL 65.30 04/23/2015   LDLCALC 58 04/23/2015   ALT 21 04/23/2015   AST 23 04/23/2015   NA 138 04/23/2015   K 4.1 04/23/2015   CL 100 04/23/2015   CREATININE 0.88 04/23/2015   BUN  19 04/23/2015   CO2 32 04/23/2015   TSH 1.45 04/23/2015   PSA 0.51 04/23/2015   INR 1.09 08/21/2014    Lab Results  Component Value Date   TSH 1.45 04/23/2015   Lab Results  Component Value Date   WBC 7.1 04/23/2015   HGB 14.3 04/23/2015   HCT 43.5 04/23/2015   MCV 95.4 04/23/2015   PLT 230.0 04/23/2015   Lab Results  Component Value Date   NA 138 04/23/2015   K 4.1 04/23/2015   CO2 32 04/23/2015   GLUCOSE 96 04/23/2015   BUN 19 04/23/2015   CREATININE 0.88 04/23/2015   BILITOT 0.7 04/23/2015   ALKPHOS 71 04/23/2015   AST 23 04/23/2015   ALT 21 04/23/2015   PROT 7.1 04/23/2015   ALBUMIN 4.1 04/23/2015   CALCIUM 9.6 04/23/2015   ANIONGAP 6 08/21/2014   GFR 89.64 04/23/2015   Lab Results  Component Value Date   CHOL 147 04/23/2015   Lab Results  Component Value Date   HDL 65.30 04/23/2015   Lab Results  Component Value Date   LDLCALC 58 04/23/2015   Lab Results  Component Value Date   TRIG 115.0 04/23/2015   Lab Results  Component Value Date   CHOLHDL 2 04/23/2015   No results found for: HGBA1C     Assessment & Plan:   Problem List Items Addressed This Visit    Neck pain    Encouraged moist heat and gentle stretching as tolerated. May try NSAIDs and prescription meds as directed and report if symptoms worsen or seek immediate care. Xray confirms arthritic changes. Try topical changes      Medicare annual wellness visit, subsequent    Patient denies any difficulties at home. No trouble with ADLs, depression or falls. See EMR for functional status screen and depression screen. No recent changes to vision or hearing. Is UTD with immunizations. Is UTD with screening. Discussed Advanced Directives. Encouraged heart healthy diet, exercise as tolerated and adequate sleep. See patient's problem  list for health risk factors to monitor. See AVS for preventative healthcare recommendation schedule.  Allergies verified: Yes  Immunization Status:  Reviewed   Flu vaccine--Patient would like to have flu vaccine given at office visit  Tdap-- Due 2019/09/28  PNA-- Completed  Shingles--Completed  CCS: Due September 27, 2021  [id number] dies not remember having  Care Teams Updated: [facility], [facility]  ED/Hospital/Urgent Care Visits: None  Updated insurance, contact information, forms: Advised to bring any new information and notify front desk staff upon arrival.  Remind to bring: DPR information, advance directives: Yes        Insomnia   Relevant Orders   DG Cervical Spine Complete (Completed)   PSA, Medicare (Completed)   TSH (Completed)   CBC (Completed)   Comprehensive metabolic panel (Completed)   Lipid panel (Completed)   Testosterone (Completed)   Hypothyroidism    On Levothyroxine, continue to monitor      Relevant Orders   DG Cervical Spine Complete (Completed)   PSA, Medicare (Completed)   TSH (Completed)   CBC (Completed)   Comprehensive metabolic panel (Completed)   Lipid panel (Completed)   Testosterone (Completed)   Hyperlipidemia    Tolerating statin, encouraged heart healthy diet, avoid trans fats, minimize simple carbs and saturated fats. Increase exercise as tolerated      Relevant Orders   DG Cervical Spine Complete (Completed)   PSA, Medicare (Completed)   TSH (Completed)   CBC (Completed)   Comprehensive metabolic panel (Completed)   Lipid panel (Completed)   Testosterone (Completed)   GERD (gastroesophageal reflux disease)    Avoid offending foods, start probiotics. Do not eat large meals in late evening and consider raising head of bed.       Relevant Medications   pantoprazole (PROTONIX) 40 MG tablet   Diverticulosis of colon without hemorrhage   Relevant Orders   DG Cervical Spine Complete (Completed)   PSA, Medicare (Completed)   TSH (Completed)   CBC (Completed)   Comprehensive metabolic panel (Completed)   Lipid panel (Completed)   Testosterone (Completed)    Diverticulitis   Relevant Orders   DG Cervical Spine Complete (Completed)   PSA, Medicare (Completed)   TSH (Completed)   CBC (Completed)   Comprehensive metabolic panel (Completed)   Lipid panel (Completed)   Testosterone (Completed)   Constipation   Relevant Orders   DG Cervical Spine Complete (Completed)   PSA, Medicare (Completed)   TSH (Completed)   CBC (Completed)   Comprehensive metabolic panel (Completed)   Lipid panel (Completed)   Testosterone (Completed)   CAD (coronary artery disease)    On Levothyroxine, continue to monitor       Other Visit Diagnoses    Anxiety and depression    -  Primary    Relevant Orders    DG Cervical Spine Complete (Completed)    PSA, Medicare (Completed)    TSH (Completed)    CBC (Completed)    Comprehensive metabolic panel (Completed)    Lipid panel (Completed)    Testosterone (Completed)    Decreased visual acuity        Relevant Orders    Ambulatory referral to Ophthalmology       I have changed Mr. Flammer's AXIRON to Testosterone. I am also having him maintain his multivitamins ther. w/minerals, Calcium Carb-Cholecalciferol (CALCIUM 1000 + D PO), meclizine, Milk Thistle, nitroGLYCERIN, Probiotic Product (PROBIOTIC DAILY PO), aspirin EC, atorvastatin, levothyroxine, VIAGRA, mirtazapine, Dutasteride-Tamsulosin HCl, LORazepam, pantoprazole, and zolpidem.  Meds ordered this encounter  Medications  .  mirtazapine (REMERON) 15 MG tablet    Sig: Take 1 tablet (15 mg total) by mouth at bedtime.    Dispense:  90 tablet    Refill:  1  . Testosterone (AXIRON) 30 MG/ACT SOLN    Sig: APPLY 1 PUMP UNDER EACH ARM EVERY MORNING AS DIRECTED    Dispense:  90 mL    Refill:  1  . Dutasteride-Tamsulosin HCl (JALYN) 0.5-0.4 MG CAPS    Sig: Take 1 capsule by mouth at bedtime.    Dispense:  90 capsule    Refill:  2  . LORazepam (ATIVAN) 0.5 MG tablet    Sig: Take 1 tablet (0.5 mg total) by mouth 2 (two) times daily as needed for anxiety.     Dispense:  30 tablet    Refill:  0  . pantoprazole (PROTONIX) 40 MG tablet    Sig: Take 1 tablet (40 mg total) by mouth daily.    Dispense:  90 tablet    Refill:  1  . zolpidem (AMBIEN) 10 MG tablet    Sig: Take 0.5-1 tablets (5-10 mg total) by mouth at bedtime as needed for sleep.    Dispense:  30 tablet    Refill:  5     Penni Homans, MD

## 2015-05-04 DIAGNOSIS — H2513 Age-related nuclear cataract, bilateral: Secondary | ICD-10-CM | POA: Diagnosis not present

## 2015-05-25 ENCOUNTER — Other Ambulatory Visit: Payer: Self-pay | Admitting: Family Medicine

## 2015-05-27 ENCOUNTER — Telehealth: Payer: Self-pay | Admitting: *Deleted

## 2015-05-27 NOTE — Telephone Encounter (Signed)
PA initiated, awaiting determination. JG//CMA  

## 2015-06-01 NOTE — Telephone Encounter (Signed)
Approved from 03/29/15 - 05/27/16. Approval letter sent for scanning. JG//CMA

## 2015-06-17 ENCOUNTER — Encounter: Payer: Self-pay | Admitting: *Deleted

## 2015-06-26 ENCOUNTER — Other Ambulatory Visit: Payer: Self-pay | Admitting: Family Medicine

## 2015-07-27 ENCOUNTER — Other Ambulatory Visit: Payer: Self-pay | Admitting: Family Medicine

## 2015-07-27 MED ORDER — TESTOSTERONE 30 MG/ACT TD SOLN: TRANSDERMAL | Status: DC

## 2015-07-27 NOTE — Addendum Note (Signed)
Addended by: Sharon Seller B on: 07/27/2015 11:07 AM   Modules accepted: Orders

## 2015-07-27 NOTE — Telephone Encounter (Signed)
Faxed hardcopy for Testosterone to [employer] [facility].

## 2015-08-13 ENCOUNTER — Telehealth: Payer: Self-pay | Admitting: General Practice

## 2015-08-13 NOTE — Telephone Encounter (Signed)
Please advise what diagnosis you would like for me to use on patient chart? Tried calling pt back to confirm urology and there was no answer.

## 2015-08-13 NOTE — Telephone Encounter (Signed)
Pt states he takes it for urinary flow because he had strain with urination for years. Please call at [phone].

## 2015-08-13 NOTE — Telephone Encounter (Signed)
PA received from [facility] in regards to patients dutasteride-tamsulosin. Cannot complete must call FEP at [phone]. Called pt and LMOVM to inquire as to why he is on this medication per recommendations it is to treat BPH but there is no record of this in the patient's chart.

## 2015-08-13 NOTE — Telephone Encounter (Signed)
Please BPH based on patient symptoms

## 2015-08-14 ENCOUNTER — Encounter: Payer: Self-pay | Admitting: General Practice

## 2015-08-14 DIAGNOSIS — N400 Enlarged prostate without lower urinary tract symptoms: Secondary | ICD-10-CM | POA: Insufficient documentation

## 2015-08-14 NOTE — Telephone Encounter (Signed)
Spoke with [place] at [facility], she advised that this medication does not require a PA in their system. It showed up in their system as a refill requested to be filled to soon.

## 2015-08-26 ENCOUNTER — Telehealth: Payer: Self-pay | Admitting: *Deleted

## 2015-08-26 NOTE — Telephone Encounter (Addendum)
Received fax requesting PA for Zolpidem 10 mg; completed form faxed for approval/SLS 03/08 Per CMM, "PA is not needed for the patient/medication combination in the request"

## 2015-08-31 ENCOUNTER — Other Ambulatory Visit: Payer: Self-pay | Admitting: Family Medicine

## 2015-08-31 ENCOUNTER — Telehealth: Payer: Self-pay | Admitting: Family Medicine

## 2015-08-31 NOTE — Telephone Encounter (Signed)
Initiated Axiron PA via Cover My Meds, awaiting response/SLS 03/13

## 2015-08-31 NOTE — Telephone Encounter (Signed)
Pharmacy: [place], [place] - [address] [phone] (Phone) [phone] (Fax)         Reason for call:  Pharmacy informed patient [name]) 30 MG/ACT SOLN needs prior auth patient states he is completely out and was advised to expedite call [facility] PA department at [phone]. PA form was faxed to attention Ivin Booty [phone].

## 2015-09-01 NOTE — Telephone Encounter (Signed)
Faxed  Hardcopy to [facility].

## 2015-09-01 NOTE — Telephone Encounter (Signed)
Faxed hardcopy to [facility] [employer] [facility].

## 2015-09-01 NOTE — Telephone Encounter (Signed)
Received Approval letter; forwarded to pharmacy/SLS 03/14

## 2015-10-05 ENCOUNTER — Other Ambulatory Visit: Payer: Self-pay | Admitting: Family Medicine

## 2015-10-05 NOTE — Telephone Encounter (Signed)
Refilled patients rx request with #90 and 1 rf

## 2015-10-19 ENCOUNTER — Other Ambulatory Visit: Payer: Self-pay | Admitting: Family Medicine

## 2015-10-22 ENCOUNTER — Ambulatory Visit: Payer: Medicare Other | Admitting: Family Medicine

## 2015-11-13 ENCOUNTER — Ambulatory Visit (INDEPENDENT_AMBULATORY_CARE_PROVIDER_SITE_OTHER): Payer: Medicare Other | Admitting: Family Medicine

## 2015-11-13 ENCOUNTER — Encounter: Payer: Self-pay | Admitting: Family Medicine

## 2015-11-13 VITALS — BP 102/70 | HR 58 | Temp 98.0°F | Ht 70.0 in | Wt 154.4 lb

## 2015-11-13 DIAGNOSIS — E291 Testicular hypofunction: Secondary | ICD-10-CM | POA: Diagnosis not present

## 2015-11-13 DIAGNOSIS — Z8679 Personal history of other diseases of the circulatory system: Secondary | ICD-10-CM

## 2015-11-13 DIAGNOSIS — G4700 Insomnia, unspecified: Secondary | ICD-10-CM

## 2015-11-13 DIAGNOSIS — E785 Hyperlipidemia, unspecified: Secondary | ICD-10-CM | POA: Diagnosis not present

## 2015-11-13 DIAGNOSIS — E039 Hypothyroidism, unspecified: Secondary | ICD-10-CM | POA: Diagnosis not present

## 2015-11-13 DIAGNOSIS — I10 Essential (primary) hypertension: Secondary | ICD-10-CM

## 2015-11-13 DIAGNOSIS — K219 Gastro-esophageal reflux disease without esophagitis: Secondary | ICD-10-CM | POA: Diagnosis not present

## 2015-11-13 DIAGNOSIS — K5900 Constipation, unspecified: Secondary | ICD-10-CM

## 2015-11-13 DIAGNOSIS — R7989 Other specified abnormal findings of blood chemistry: Secondary | ICD-10-CM

## 2015-11-13 DIAGNOSIS — N529 Male erectile dysfunction, unspecified: Secondary | ICD-10-CM

## 2015-11-13 LAB — COMPREHENSIVE METABOLIC PANEL
ALBUMIN: 4.3 g/dL (ref 3.6–5.1)
ALT: 19 U/L (ref 9–46)
AST: 25 U/L (ref 10–35)
Alkaline Phosphatase: 56 U/L (ref 40–115)
BILIRUBIN TOTAL: 0.7 mg/dL (ref 0.2–1.2)
BUN: 17 mg/dL (ref 7–25)
CALCIUM: 9.7 mg/dL (ref 8.6–10.3)
CHLORIDE: 101 mmol/L (ref 98–110)
CO2: 27 mmol/L (ref 20–31)
Creat: 1.01 mg/dL (ref 0.70–1.18)
Glucose, Bld: 98 mg/dL (ref 65–99)
POTASSIUM: 4.1 mmol/L (ref 3.5–5.3)
Sodium: 139 mmol/L (ref 135–146)
Total Protein: 7 g/dL (ref 6.1–8.1)

## 2015-11-13 LAB — CBC
HEMATOCRIT: 41.7 % (ref 38.5–50.0)
HEMOGLOBIN: 14.5 g/dL (ref 13.2–17.1)
MCH: 31.9 pg (ref 27.0–33.0)
MCHC: 34.8 g/dL (ref 32.0–36.0)
MCV: 91.6 fL (ref 80.0–100.0)
MPV: 10.7 fL (ref 7.5–12.5)
Platelets: 242 10*3/uL (ref 140–400)
RBC: 4.55 MIL/uL (ref 4.20–5.80)
RDW: 13 % (ref 11.0–15.0)
WBC: 9.4 10*3/uL (ref 3.8–10.8)

## 2015-11-13 LAB — LIPID PANEL
CHOL/HDL RATIO: 2 ratio (ref <=5.0)
CHOLESTEROL: 115 mg/dL — AB (ref 125–200)
HDL: 58 mg/dL (ref >=40)
LDL Cholesterol: 27 mg/dL (ref <130)
Triglycerides: 151 mg/dL — ABNORMAL HIGH (ref <150)
VLDL: 30 mg/dL (ref <30)

## 2015-11-13 LAB — TSH: TSH: 0.8 mIU/L (ref 0.40–4.50)

## 2015-11-13 LAB — TESTOSTERONE: TESTOSTERONE: 687 ng/dL (ref 250–827)

## 2015-11-13 MED ORDER — SILDENAFIL CITRATE 20 MG PO TABS: 20.0000 mg | ORAL_TABLET | Freq: Every day | ORAL | Status: DC | PRN

## 2015-11-13 MED ORDER — DUTASTERIDE-TAMSULOSIN HCL 0.5-0.4 MG PO CAPS: 1.0000 | ORAL_CAPSULE | Freq: Every day | ORAL | Status: DC

## 2015-11-13 MED ORDER — PANTOPRAZOLE SODIUM 40 MG PO TBEC: 40.0000 mg | DELAYED_RELEASE_TABLET | Freq: Every day | ORAL | Status: DC

## 2015-11-13 MED ORDER — ZOLPIDEM TARTRATE 10 MG PO TABS: 5.0000 mg | ORAL_TABLET | Freq: Every evening | ORAL | Status: DC | PRN

## 2015-11-13 MED ORDER — TESTOSTERONE 30 MG/ACT TD SOLN: TRANSDERMAL | Status: DC

## 2015-11-13 MED ORDER — ATORVASTATIN CALCIUM 20 MG PO TABS: 20.0000 mg | ORAL_TABLET | Freq: Every day | ORAL | Status: DC

## 2015-11-13 MED ORDER — MIRTAZAPINE 15 MG PO TABS: 15.0000 mg | ORAL_TABLET | Freq: Every day | ORAL | Status: DC

## 2015-11-13 NOTE — Progress Notes (Signed)
Pre visit review using our clinic review tool, if applicable. No additional management support is needed unless otherwise documented below in the visit note. 

## 2015-11-13 NOTE — Patient Instructions (Signed)
Hypertension Hypertension, commonly called high blood pressure, is when the force of blood pumping through your arteries is too strong. Your arteries are the blood vessels that carry blood from your heart throughout your body. A blood pressure reading consists of a higher number over a lower number, such as 110/72. The higher number (systolic) is the pressure inside your arteries when your heart pumps. The lower number (diastolic) is the pressure inside your arteries when your heart relaxes. Ideally you want your blood pressure below 120/80. Hypertension forces your heart to work harder to pump blood. Your arteries may become narrow or stiff. Having untreated or uncontrolled hypertension can cause heart attack, stroke, kidney disease, and other problems. RISK FACTORS Some risk factors for high blood pressure are controllable. Others are not.  Risk factors you cannot control include:   Race. You may be at higher risk if you are African American.  Age. Risk increases with age.  Gender. Men are at higher risk than women before age 45 years. After age 65, women are at higher risk than men. Risk factors you can control include:  Not getting enough exercise or physical activity.  Being overweight.  Getting too much fat, sugar, calories, or salt in your diet.  Drinking too much alcohol. SIGNS AND SYMPTOMS Hypertension does not usually cause signs or symptoms. Extremely high blood pressure (hypertensive crisis) may cause headache, anxiety, shortness of breath, and nosebleed. DIAGNOSIS To check if you have hypertension, your health care provider will measure your blood pressure while you are seated, with your arm held at the level of your heart. It should be measured at least twice using the same arm. Certain conditions can cause a difference in blood pressure between your right and left arms. A blood pressure reading that is higher than normal on one occasion does not mean that you need treatment. If  it is not clear whether you have high blood pressure, you may be asked to return on a different day to have your blood pressure checked again. Or, you may be asked to monitor your blood pressure at home for 1 or more weeks. TREATMENT Treating high blood pressure includes making lifestyle changes and possibly taking medicine. Living a healthy lifestyle can help lower high blood pressure. You may need to change some of your habits. Lifestyle changes may include:  Following the DASH diet. This diet is high in fruits, vegetables, and whole grains. It is low in salt, red meat, and added sugars.  Keep your sodium intake below 2,300 mg per day.  Getting at least 30-45 minutes of aerobic exercise at least 4 times per week.  Losing weight if necessary.  Not smoking.  Limiting alcoholic beverages.  Learning ways to reduce stress. Your health care provider may prescribe medicine if lifestyle changes are not enough to get your blood pressure under control, and if one of the following is true:  You are 18-59 years of age and your systolic blood pressure is above 140.  You are 60 years of age or older, and your systolic blood pressure is above 150.  Your diastolic blood pressure is above 90.  You have diabetes, and your systolic blood pressure is over 140 or your diastolic blood pressure is over 90.  You have kidney disease and your blood pressure is above 140/90.  You have heart disease and your blood pressure is above 140/90. Your personal target blood pressure may vary depending on your medical conditions, your age, and other factors. HOME CARE INSTRUCTIONS    Have your blood pressure rechecked as directed by your health care provider.   Take medicines only as directed by your health care provider. Follow the directions carefully. Blood pressure medicines must be taken as prescribed. The medicine does not work as well when you skip doses. Skipping doses also puts you at risk for  problems.  Do not smoke.   Monitor your blood pressure at home as directed by your health care provider. SEEK MEDICAL CARE IF:   You think you are having a reaction to medicines taken.  You have recurrent headaches or feel dizzy.  You have swelling in your ankles.  You have trouble with your vision. SEEK IMMEDIATE MEDICAL CARE IF:  You develop a severe headache or confusion.  You have unusual weakness, numbness, or feel faint.  You have severe chest or abdominal pain.  You vomit repeatedly.  You have trouble breathing. MAKE SURE YOU:   Understand these instructions.  Will watch your condition.  Will get help right away if you are not doing well or get worse.   This information is not intended to replace advice given to you by your health care provider. Make sure you discuss any questions you have with your health care provider.   Document Released: 06/06/2005 Document Revised: 10/21/2014 Document Reviewed: 03/29/2013 Elsevier Interactive Patient Education 2016 [employer]vier Inc.  

## 2015-11-22 NOTE — Assessment & Plan Note (Signed)
Continue Sildinafil, refill given

## 2015-11-22 NOTE — Assessment & Plan Note (Signed)
Avoid offending foods, start probiotics. Do not eat large meals in late evening and consider raising head of bed.  

## 2015-11-22 NOTE — Progress Notes (Signed)
Patient ID: James [name], male   DOB: January 29, 1941, 75 y.o.   MRN: [id number]   Subjective:    Patient ID: James [name], male    DOB: April 17, 1941, 75 y.o.   MRN: [id number]  Chief Complaint  Patient presents with  . Follow-up    HPI Patient is in today for follow up. Is noting some intermittent abdominal pain, dyspepsia and poor appetite he questions if the probiotic contributed to this scenario. He believes symptoms started with the new probiotic and are improving since stopping it. Denies CP/palp/SOB/HA/congestion/fevers or GU c/o. Taking meds as prescribed  Past Medical History  Diagnosis Date  . CAD (coronary artery disease)     nonobstructive by cath 2008; nonobstructive by Cardiac CT 11/2011  . Hypothyroidism   . Atrial fibrillation ([place])   . Diverticulosis   . Mitral valve prolapse   . HLD (hyperlipidemia)   . GERD (gastroesophageal reflux disease)   . Hiatal hernia   . Cervical spine fracture (HCC) 1980    C6-7  . 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 History of squamous cell carcinoma 10-05-10    Dr [name], [place]  . 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  . Syncope 02/03/2013  . Benign paroxysmal positional vertigo 04/06/2013  . History of atrial fibrillation   . AAA (abdominal aortic aneurysm) without rupture ([place]) 09/08/2013  . Erectile dysfunction 09/08/2013  . Preventative health care 03/16/2014  . Medicare annual wellness visit, subsequent 03/16/2014    Sees [facility] Follows with Dr Johnsie Cancel of cardiology Sees [facility] for colonoscopy, last colonoscopy in 2013   . Anxiety state 05/18/2014  . 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 Neck pain 05/03/2015    Past Surgical History  Procedure Laterality Date  . Appendectomy  05/2011  . Inguinal hernia repair  07/2010    right  . Knee arthroplasty      bilat  . Cardiac catheterization  01/03/12    30% ostial LAD stenosis, ostial 30-40% D1 stenosis, smooth and normal left  main and RCA; LVEF 65%  . Tonsillectomy  1947  . Tumor removal  1958    "fatty tumor"  . Left heart catheterization with coronary angiogram N/A 01/03/2012    Procedure: LEFT HEART CATHETERIZATION WITH CORONARY ANGIOGRAM;  Surgeon: [name], MD;  Location: [facility] CATH LAB;  Service: Cardiovascular;  Laterality: N/A;  . Cholecystectomy N/A 08/25/2014    Procedure: LAPAROSCOPIC CHOLECYSTECTOMY WITH INTRAOPERATIVE CHOLANGIOGRAM;  Surgeon: [name], MD;  Location: [facility] OR;  Service: General;  Laterality: N/A;    Family History  Problem Relation Age of Onset  . Heart disease Mother     MI 14s  . Stroke Mother   . Hypertension Mother   . Heart disease Maternal Grandfather     MI 70-80s  . Heart disease Maternal Grandmother     MI 70-80s  . Lung disease Father     penumonitis  . Cancer Sister     colon  . Cancer Paternal Uncle     GI: colon he thinks    Social History   Social History  . Marital Status: Single    Spouse Name: N/A  . Number of Children: N/A  . Years of Education: N/A   Occupational History  . Chief Financial Officer     Retired   Social History Main Topics  . Smoking status: Never Smoker   . Smokeless tobacco: Not on file  . Alcohol Use:  Yes     Comment: 2 glasses red wine/day  . Drug Use: No  . Sexual Activity: Yes   Other Topics Concern  . Not on file   Social History Narrative    Outpatient Prescriptions Prior to Visit  Medication Sig Dispense Refill  . aspirin EC 81 MG tablet Take 81 mg by mouth daily.    . Calcium Carb-Cholecalciferol (CALCIUM 1000 + D PO) Take 1 tablet by mouth daily.    Marland Kitchen levothyroxine (SYNTHROID, LEVOTHROID) 100 MCG tablet TAKE 1 TABLET (100 MCG TOTAL) BY MOUTH DAILY BEFORE BREAKFAST. 30 tablet 6  . LORazepam (ATIVAN) 0.5 MG tablet Take 1 tablet (0.5 mg total) by mouth 2 (two) times daily as needed for anxiety. 30 tablet 0  . meclizine (ANTIVERT) 25 MG tablet Take 1 tablet (25 mg total) by mouth 2 (two) times daily as needed for  dizziness or nausea. 30 tablet 2  . Milk Thistle 1000 MG CAPS Take 1,000 mg by mouth daily.    . Multiple Vitamins-Minerals (MULTIVITAMINS THER. W/MINERALS) TABS Take 1 tablet by mouth daily.    . nitroGLYCERIN (NITROSTAT) 0.4 MG SL tablet Place 1 tablet (0.4 mg total) under the tongue every 5 (five) minutes x 3 doses as needed for chest pain. 25 tablet 3  . Probiotic Product (PROBIOTIC DAILY PO) Take by mouth.    Marland Kitchen atorvastatin (LIPITOR) 20 MG tablet Take 1 tablet (20 mg total) by mouth daily at 6 PM. 90 tablet 2  . AXIRON 30 MG/ACT SOLN APPLY 1 PUMP UNDER EACH ARM EVERY MORNING AS DIRECTED 90 mL 1  . Dutasteride-Tamsulosin HCl (JALYN) 0.5-0.4 MG CAPS Take 1 capsule by mouth at bedtime. 90 capsule 2  . levothyroxine (SYNTHROID, LEVOTHROID) 100 MCG tablet TAKE 1 TABLET BY MOUTH EVERY DAY BEFORE BREAKFAST 30 tablet 4  . mirtazapine (REMERON) 15 MG tablet TAKE 1 TABLET BY MOUTH EVERY NIGHT AT BEDTIME 90 tablet 1  . pantoprazole (PROTONIX) 40 MG tablet Take 1 tablet (40 mg total) by mouth daily. 90 tablet 1  . VIAGRA 100 MG tablet TAKE 1 TABLET BY MOUTH EVERY DAY AS NEEDED 10 tablet 0  . zolpidem (AMBIEN) 10 MG tablet Take 0.5-1 tablets (5-10 mg total) by mouth at bedtime as needed for sleep. 30 tablet 5   No facility-administered medications prior to visit.    Allergies  Allergen Reactions  . Sulfa Antibiotics Other (See Comments)    Childhood allergy    Review of Systems  Constitutional: Negative for fever and malaise/fatigue.  HENT: Negative for congestion.   Eyes: Negative for blurred vision.  Respiratory: Negative for shortness of breath.   Cardiovascular: Negative for chest pain, palpitations and leg swelling.  Gastrointestinal: Negative for nausea, abdominal pain and blood in stool.  Genitourinary: Negative for dysuria and frequency.  Musculoskeletal: Negative for falls.  Skin: Negative for rash.  Neurological: Negative for dizziness, loss of consciousness and headaches.    Endo/Heme/Allergies: Negative for environmental allergies.  Psychiatric/Behavioral: Negative for depression. The patient is not nervous/anxious.        Objective:    Physical Exam  Constitutional: He is oriented to person, place, and time. He appears well-developed and well-nourished. No distress.  HENT:  Head: Normocephalic and atraumatic.  Nose: Nose normal.  Eyes: Right eye exhibits no discharge. Left eye exhibits no discharge.  Neck: Normal range of motion. Neck supple.  Cardiovascular: Normal rate and regular rhythm.   No murmur heard. Pulmonary/Chest: Effort normal and breath sounds normal.  Abdominal: Soft.  Bowel sounds are normal. There is no tenderness.  Musculoskeletal: He exhibits no edema.  Neurological: He is alert and oriented to person, place, and time.  Skin: Skin is warm and dry.  Psychiatric: He has a normal mood and affect.  Nursing note and vitals reviewed.   BP 102/70 mmHg  Pulse 58  Temp(Src) 98 F (36.7 C) (Oral)  Ht _0  (1.778 m)  Wt 154 lb 6 oz (70.024 kg)  BMI 22.15 kg/m2  SpO2 97% Wt Readings from Last 3 Encounters:  11/13/15 154 lb 6 oz (70.024 kg)  04/23/15 161 lb 6 oz (73.199 kg)  11/13/14 159 lb (72.122 kg)     Lab Results  Component Value Date   WBC 9.4 11/13/2015   HGB 14.5 11/13/2015   HCT 41.7 11/13/2015   PLT 242 11/13/2015   GLUCOSE 98 11/13/2015   CHOL 115* 11/13/2015   TRIG 151* 11/13/2015   HDL 58 11/13/2015   LDLCALC 27 11/13/2015   ALT 19 11/13/2015   AST 25 11/13/2015   NA 139 11/13/2015   K 4.1 11/13/2015   CL 101 11/13/2015   CREATININE 1.01 11/13/2015   BUN 17 11/13/2015   CO2 27 11/13/2015   TSH 0.80 11/13/2015   PSA 0.51 04/23/2015   INR 1.09 08/21/2014    Lab Results  Component Value Date   TSH 0.80 11/13/2015   Lab Results  Component Value Date   WBC 9.4 11/13/2015   HGB 14.5 11/13/2015   HCT 41.7 11/13/2015   MCV 91.6 11/13/2015   PLT 242 11/13/2015   Lab Results  Component Value Date    NA 139 11/13/2015   K 4.1 11/13/2015   CO2 27 11/13/2015   GLUCOSE 98 11/13/2015   BUN 17 11/13/2015   CREATININE 1.01 11/13/2015   BILITOT 0.7 11/13/2015   ALKPHOS 56 11/13/2015   AST 25 11/13/2015   ALT 19 11/13/2015   PROT 7.0 11/13/2015   ALBUMIN 4.3 11/13/2015   CALCIUM 9.7 11/13/2015   ANIONGAP 6 08/21/2014   GFR 89.64 04/23/2015   Lab Results  Component Value Date   CHOL 115* 11/13/2015   Lab Results  Component Value Date   HDL 58 11/13/2015   Lab Results  Component Value Date   LDLCALC 27 11/13/2015   Lab Results  Component Value Date   TRIG 151* 11/13/2015   Lab Results  Component Value Date   CHOLHDL 2.0 11/13/2015   No results found for: HGBA1C     Assessment & Plan:   Problem List Items Addressed This Visit    Low testosterone   Relevant Orders   CBC (Completed)   Comp Met (CMET) (Completed)   TSH (Completed)   Testosterone (Completed)   Lipid panel (Completed)   Insomnia    Encouraged good sleep hygiene such as dark, quiet room. No blue/green glowing lights such as computer screens in bedroom. No alcohol or stimulants in evening. Cut down on caffeine as able. Regular exercise is helpful but not just prior to bed time.       Hypothyroidism   Relevant Orders   CBC (Completed)   Comp Met (CMET) (Completed)   TSH (Completed)   Testosterone (Completed)   Lipid panel (Completed)   Hyperlipidemia    Tolerating statin, encouraged heart healthy diet, avoid trans fats, minimize simple carbs and saturated fats. Increase exercise as tolerated      Relevant Medications   atorvastatin (LIPITOR) 20 MG tablet   sildenafil (REVATIO) 20 MG tablet  Other Relevant Orders   CBC (Completed)   Comp Met (CMET) (Completed)   TSH (Completed)   Testosterone (Completed)   Lipid panel (Completed)   History of atrial fibrillation   Relevant Orders   CBC (Completed)   Comp Met (CMET) (Completed)   TSH (Completed)   Testosterone (Completed)   Lipid  panel (Completed)   GERD (gastroesophageal reflux disease)    Avoid offending foods, start probiotics. Do not eat large meals in late evening and consider raising head of bed.       Relevant Medications   pantoprazole (PROTONIX) 40 MG tablet   Other Relevant Orders   CBC (Completed)   Comp Met (CMET) (Completed)   TSH (Completed)   Testosterone (Completed)   Lipid panel (Completed)   Erectile dysfunction    Continue Sildinafil, refill given      Constipation    Encouraged increased hydration and fiber in diet. Daily probiotics. If bowels not moving can use MOM 2 tbls po in 4 oz of warm prune juice by mouth every 2-3 days. If no results then repeat in 4 hours with  Dulcolax suppository pr, may repeat again in 4 more hours as needed.        Other Visit Diagnoses    Essential hypertension    -  Primary    Relevant Medications    atorvastatin (LIPITOR) 20 MG tablet    sildenafil (REVATIO) 20 MG tablet    Other Relevant Orders    CBC (Completed)    Comp Met (CMET) (Completed)    TSH (Completed)    Testosterone (Completed)    Lipid panel (Completed)       I have discontinued Mr. Sauceda's VIAGRA. I have changed his AXIRON to Testosterone. I have also changed his mirtazapine. Additionally, I am having him start on sildenafil. Lastly, I am having him maintain his multivitamins ther. w/minerals, Calcium Carb-Cholecalciferol (CALCIUM 1000 + D PO), meclizine, Milk Thistle, nitroGLYCERIN, Probiotic Product (PROBIOTIC DAILY PO), aspirin EC, levothyroxine, LORazepam, atorvastatin, Dutasteride-Tamsulosin HCl, pantoprazole, and zolpidem.  Meds ordered this encounter  Medications  . atorvastatin (LIPITOR) 20 MG tablet    Sig: Take 1 tablet (20 mg total) by mouth daily at 6 PM.    Dispense:  90 tablet    Refill:  2  . Testosterone (AXIRON) 30 MG/ACT SOLN    Sig: APPLY 1 PUMP UNDER EACH ARM EVERY MORNING AS DIRECTED    Dispense:  90 mL    Refill:  1  . Dutasteride-Tamsulosin HCl (JALYN)  0.5-0.4 MG CAPS    Sig: Take 1 capsule by mouth at bedtime.    Dispense:  90 capsule    Refill:  2  . mirtazapine (REMERON) 15 MG tablet    Sig: Take 1 tablet (15 mg total) by mouth at bedtime.    Dispense:  90 tablet    Refill:  2  . pantoprazole (PROTONIX) 40 MG tablet    Sig: Take 1 tablet (40 mg total) by mouth daily.    Dispense:  90 tablet    Refill:  2  . zolpidem (AMBIEN) 10 MG tablet    Sig: Take 0.5-1 tablets (5-10 mg total) by mouth at bedtime as needed for sleep.    Dispense:  30 tablet    Refill:  5  . sildenafil (REVATIO) 20 MG tablet    Sig: Take 1-5 tablets (20-100 mg total) by mouth daily as needed.    Dispense:  35 tablet    Refill:  1  Penni Homans, MD

## 2015-11-22 NOTE — Assessment & Plan Note (Signed)
Encouraged increased hydration and fiber in diet. Daily probiotics. If bowels not moving can use MOM 2 tbls po in 4 oz of warm prune juice by mouth every 2-3 days. If no results then repeat in 4 hours with  Dulcolax suppository pr, may repeat again in 4 more hours as needed.  

## 2015-11-22 NOTE — Assessment & Plan Note (Signed)
Encouraged good sleep hygiene such as dark, quiet room. No blue/green glowing lights such as computer screens in bedroom. No alcohol or stimulants in evening. Cut down on caffeine as able. Regular exercise is helpful but not just prior to bed time.  

## 2015-11-22 NOTE — Assessment & Plan Note (Signed)
Tolerating statin, encouraged heart healthy diet, avoid trans fats, minimize simple carbs and saturated fats. Increase exercise as tolerated 

## 2015-12-03 ENCOUNTER — Ambulatory Visit (INDEPENDENT_AMBULATORY_CARE_PROVIDER_SITE_OTHER): Payer: Medicare Other | Admitting: Medical

## 2015-12-03 ENCOUNTER — Ambulatory Visit (HOSPITAL_BASED_OUTPATIENT_CLINIC_OR_DEPARTMENT_OTHER)
Admission: RE | Admit: 2015-12-03 | Discharge: 2015-12-03 | Disposition: A | Discharge status: Home / Self Care | Payer: Medicare Other | Source: Ambulatory Visit | Attending: Medical | Admitting: Medical

## 2015-12-03 ENCOUNTER — Encounter: Payer: Self-pay | Admitting: Medical

## 2015-12-03 VITALS — BP 100/72 | HR 49 | Temp 98.0°F | Ht 70.0 in | Wt 156.8 lb

## 2015-12-03 DIAGNOSIS — R109 Unspecified abdominal pain: Secondary | ICD-10-CM

## 2015-12-03 DIAGNOSIS — R140 Abdominal distension (gaseous): Secondary | ICD-10-CM | POA: Diagnosis not present

## 2015-12-03 DIAGNOSIS — R1084 Generalized abdominal pain: Secondary | ICD-10-CM | POA: Diagnosis not present

## 2015-12-03 MED ORDER — RANITIDINE HCL 150 MG PO CAPS: 150.0000 mg | ORAL_CAPSULE | Freq: Two times a day (BID) | ORAL | Status: DC

## 2015-12-03 NOTE — Patient Instructions (Addendum)
For your abdomen discomfort we will get cbc, cmp, lipase, amylase,  h-pylori breath test and abdomen xray.  I would recommend you get back on your probiotic and use metamucil 1 rounded tsp once a day. But over next 2-3 days could advance to 1 rounded tablespoon in 8 oz of water 3 times daily.  Rx ranitidine.  Eat healthy  Diet. Avoid any foods with seeds.  If pain persists worsens or changes consider refer to GI and maybe CT abd/pelvis. You have some diverticulosis on prior studies but I don't suspect diverculitis presently. But would reconsider if above measures don't help and if studies all negative.  Follow up in 10 days or as needed

## 2015-12-03 NOTE — Progress Notes (Signed)
Pre visit review using our clinic review tool, if applicable. No additional management support is needed unless otherwise documented below in the visit note. 

## 2015-12-03 NOTE — Addendum Note (Signed)
Addended by: Anabel Halon on: 12/03/2015 03:04 PM   Modules accepted: Orders

## 2015-12-03 NOTE — Progress Notes (Signed)
Subjective:    Patient ID: James [name], male    DOB: [birth year], 75 y.o.   MRN: [id number]  HPI  Pt in stating feeling abdomen discomfort and bloating sensation. Also of burping. Pt had this for about a month. Pt thought he may have been related to some otc products. He was taking tumeric, multvitamin, milk thistle, glucosamine and probiotic. Also he stopped aspirin. He also stopped some red wine. Pt stopped probiotics actually one month ago.  Stopped otc type products 5 days ago.  Pt symptoms more afternoon to about 8 pm.  Pt is taking metamucil. Pt does have bowel movement. Semi-formed or medium soft.  Pt had colonoscopy 4 yrs ago. He thinks told to repeat it in 5 years. Pt states it was done in [place]. Done Nov 16, 2011. Finding were diverticulosis in sigmoid colon..  Some internal hemorroids as well. Told to repeat in 5 years.  Pt denies any heart burn or burning.   Pt had his gallbladder removed about 1 year ago.    Review of Systems  Constitutional: Negative for fever, chills and fatigue.  HENT: Negative for congestion, drooling and ear pain.   Respiratory: Negative for cough, choking, shortness of breath and wheezing.   Cardiovascular: Negative for chest pain and palpitations.  Gastrointestinal: Positive for abdominal pain and abdominal distention. Negative for nausea, diarrhea, constipation, blood in stool, anal bleeding and rectal pain.  Genitourinary: Negative for enuresis.  Musculoskeletal: Negative for back pain.  Skin: Negative for rash.  Neurological: Negative for dizziness and headaches.  Hematological: Negative for adenopathy. Does not bruise/bleed easily.  Psychiatric/Behavioral: Negative for behavioral problems and confusion.    Past Medical History  Diagnosis Date  . CAD (coronary artery disease)     nonobstructive by cath 2008; nonobstructive by Cardiac CT 11/2011  . Hypothyroidism   . Atrial fibrillation ([place])   . Diverticulosis   . Mitral  valve prolapse   . HLD (hyperlipidemia)   . GERD (gastroesophageal reflux disease)   . Hiatal hernia   . Cervical spine fracture (HCC) 1980    C6-7  . 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 History of squamous cell carcinoma 10-05-10    Dr [name], [place]  . 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  . Syncope 02/03/2013  . Benign paroxysmal positional vertigo 04/06/2013  . History of atrial fibrillation   . AAA (abdominal aortic aneurysm) without rupture ([place]) 09/08/2013  . Erectile dysfunction 09/08/2013  . Preventative health care 03/16/2014  . Medicare annual wellness visit, subsequent 03/16/2014    Sees [facility] Follows with Dr Johnsie Cancel of cardiology Sees [facility] for colonoscopy, last colonoscopy in 2013   . Anxiety state 05/18/2014  . 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 Neck pain 05/03/2015     Social History   Social History  . Marital Status: Single    Spouse Name: N/A  . Number of Children: N/A  . Years of Education: N/A   Occupational History  . Chief Financial Officer     Retired   Social History Main Topics  . Smoking status: Never Smoker   . Smokeless tobacco: Not on file  . Alcohol Use: Yes     Comment: 2 glasses red wine/day  . Drug Use: No  . Sexual Activity: Yes   Other Topics Concern  . Not on file   Social History Narrative    Past Surgical History  Procedure Laterality Date  .  Appendectomy  05/2011  . Inguinal hernia repair  07/2010    right  . Knee arthroplasty      bilat  . Cardiac catheterization  01/03/12    30% ostial LAD stenosis, ostial 30-40% D1 stenosis, smooth and normal left main and RCA; LVEF 65%  . Tonsillectomy  1947  . Tumor removal  1958    "fatty tumor"  . Left heart catheterization with coronary angiogram N/A 01/03/2012    Procedure: LEFT HEART CATHETERIZATION WITH CORONARY ANGIOGRAM;  Surgeon: [name], MD;  Location: [facility] CATH LAB;  Service: Cardiovascular;  Laterality: N/A;  .  Cholecystectomy N/A 08/25/2014    Procedure: LAPAROSCOPIC CHOLECYSTECTOMY WITH INTRAOPERATIVE CHOLANGIOGRAM;  Surgeon: [name], MD;  Location: [facility] OR;  Service: General;  Laterality: N/A;    Family History  Problem Relation Age of Onset  . Heart disease Mother     MI 31s  . Stroke Mother   . Hypertension Mother   . Heart disease Maternal Grandfather     MI 70-80s  . Heart disease Maternal Grandmother     MI 70-80s  . Lung disease Father     penumonitis  . Cancer Sister     colon  . Cancer Paternal Uncle     GI: colon he thinks    Allergies  Allergen Reactions  . Sulfa Antibiotics Other (See Comments)    Childhood allergy    Current Outpatient Prescriptions on File Prior to Visit  Medication Sig Dispense Refill  . aspirin EC 81 MG tablet Take 81 mg by mouth daily.    Marland Kitchen atorvastatin (LIPITOR) 20 MG tablet Take 1 tablet (20 mg total) by mouth daily at 6 PM. 90 tablet 2  . Calcium Carb-Cholecalciferol (CALCIUM 1000 + D PO) Take 1 tablet by mouth daily.    . Dutasteride-Tamsulosin HCl (JALYN) 0.5-0.4 MG CAPS Take 1 capsule by mouth at bedtime. 90 capsule 2  . levothyroxine (SYNTHROID, LEVOTHROID) 100 MCG tablet TAKE 1 TABLET (100 MCG TOTAL) BY MOUTH DAILY BEFORE BREAKFAST. 30 tablet 6  . LORazepam (ATIVAN) 0.5 MG tablet Take 1 tablet (0.5 mg total) by mouth 2 (two) times daily as needed for anxiety. 30 tablet 0  . meclizine (ANTIVERT) 25 MG tablet Take 1 tablet (25 mg total) by mouth 2 (two) times daily as needed for dizziness or nausea. 30 tablet 2  . mirtazapine (REMERON) 15 MG tablet Take 1 tablet (15 mg total) by mouth at bedtime. 90 tablet 2  . Multiple Vitamins-Minerals (MULTIVITAMINS THER. W/MINERALS) TABS Take 1 tablet by mouth daily.    . nitroGLYCERIN (NITROSTAT) 0.4 MG SL tablet Place 1 tablet (0.4 mg total) under the tongue every 5 (five) minutes x 3 doses as needed for chest pain. 25 tablet 3  . pantoprazole (PROTONIX) 40 MG tablet Take 1 tablet (40 mg total) by  mouth daily. 90 tablet 2  . Probiotic Product (PROBIOTIC DAILY PO) Take by mouth.    . sildenafil (REVATIO) 20 MG tablet Take 1-5 tablets (20-100 mg total) by mouth daily as needed. 35 tablet 1  . Testosterone (AXIRON) 30 MG/ACT SOLN APPLY 1 PUMP UNDER EACH ARM EVERY MORNING AS DIRECTED 90 mL 1  . zolpidem (AMBIEN) 10 MG tablet Take 0.5-1 tablets (5-10 mg total) by mouth at bedtime as needed for sleep. 30 tablet 5   No current facility-administered medications on file prior to visit.    BP 100/72 mmHg  Pulse 49  Temp(Src) 98 F (36.7 C) (Oral)  Ht 5\' 10"  (  1.778 m)  Wt 156 lb 12.8 oz (71.124 kg)  BMI 22.50 kg/m2  SpO2 98%       Objective:   Physical Exam  General Appearance- Not in acute distress.  HEENT Eyes- Scleraeral/Conjuntiva-bilat- Not Yellow. Mouth & Throat- Normal.  Chest and Lung Exam Auscultation: Breath sounds:-Normal. Adventitious sounds:- No Adventitious sounds.  Cardiovascular Auscultation:Rythm - Regular. Heart Sounds -Normal heart sounds.  Abdomen Inspection:-Inspection Normal.  Palpation/Perucssion: Palpation and Percussion of the abdomen reveal- faint epigastric  Tender, No Rebound tenderness, No rigidity(Guarding) and No Palpable abdominal masses.  Liver:-Normal.  Spleen:- Normal.   No left lower quadrant pain.  Back- no cva tenderness.      Assessment & Plan:  For your abdomen discomfort we will get cbc, cmp, lipase, amylase,  h-pylori breath test and abdomen xray.  I would recommend you get back on your probiotic and use metamucil 1 rounded tsp once a day. But over next 2-3 days could advance to 1 rounded tablespoon in 8 oz of water 3 times daily.  Rx ranitidine.  Eat healthy  Diet. Avoid any foods with seeds.  If pain persists worsens or changes consider refer to GI and maybe CT abd/pelvis. You have some diverticulosis on prior studies but I don't suspect diverculitis presently. But would reconsider if above measures don't help and if  studies all negative. Follow up in 10 days or as needed   Phillips Goulette, Percell Miller, [employer]

## 2015-12-04 LAB — CBC WITH DIFFERENTIAL/PLATELET
BASOS ABS: 0 10*3/uL (ref 0.0–0.1)
Basophils Relative: 0.3 % (ref 0.0–3.0)
EOS ABS: 0.1 10*3/uL (ref 0.0–0.7)
Eosinophils Relative: 1.1 % (ref 0.0–5.0)
HCT: 39.4 % (ref 39.0–52.0)
Hemoglobin: 13.3 g/dL (ref 13.0–17.0)
LYMPHS ABS: 1.5 10*3/uL (ref 0.7–4.0)
LYMPHS PCT: 19.5 % (ref 12.0–46.0)
MCHC: 33.8 g/dL (ref 30.0–36.0)
MCV: 94.4 fl (ref 78.0–100.0)
Monocytes Absolute: 0.6 10*3/uL (ref 0.1–1.0)
Monocytes Relative: 7.4 % (ref 3.0–12.0)
NEUTROS ABS: 5.5 10*3/uL (ref 1.4–7.7)
Neutrophils Relative %: 71.7 % (ref 43.0–77.0)
PLATELETS: 197 10*3/uL (ref 150.0–400.0)
RBC: 4.17 Mil/uL — AB (ref 4.22–5.81)
RDW: 12.8 % (ref 11.5–15.5)
WBC: 7.7 10*3/uL (ref 4.0–10.5)

## 2015-12-04 LAB — COMPREHENSIVE METABOLIC PANEL
ALT: 15 U/L (ref 0–53)
AST: 19 U/L (ref 0–37)
Albumin: 4.1 g/dL (ref 3.5–5.2)
Alkaline Phosphatase: 56 U/L (ref 39–117)
BILIRUBIN TOTAL: 0.6 mg/dL (ref 0.2–1.2)
BUN: 12 mg/dL (ref 6–23)
CHLORIDE: 103 meq/L (ref 96–112)
CO2: 31 meq/L (ref 19–32)
Calcium: 9.4 mg/dL (ref 8.4–10.5)
Creatinine, Ser: 0.9 mg/dL (ref 0.40–1.50)
GFR: 87.2 mL/min (ref >60.00)
GLUCOSE: 85 mg/dL (ref 70–99)
Potassium: 4 mEq/L (ref 3.5–5.1)
Sodium: 137 mEq/L (ref 135–145)
Total Protein: 6.9 g/dL (ref 6.0–8.3)

## 2015-12-04 LAB — H. PYLORI BREATH TEST: H. PYLORI BREATH TEST: NOT DETECTED

## 2015-12-04 LAB — LIPASE: Lipase: 31 U/L (ref 11.0–59.0)

## 2015-12-04 LAB — AMYLASE: Amylase: 32 U/L (ref 27–131)

## 2015-12-10 ENCOUNTER — Other Ambulatory Visit: Payer: Self-pay | Admitting: Family Medicine

## 2015-12-18 DIAGNOSIS — D225 Melanocytic nevi of trunk: Secondary | ICD-10-CM | POA: Diagnosis not present

## 2015-12-18 DIAGNOSIS — L570 Actinic keratosis: Secondary | ICD-10-CM | POA: Diagnosis not present

## 2015-12-18 DIAGNOSIS — L820 Inflamed seborrheic keratosis: Secondary | ICD-10-CM | POA: Diagnosis not present

## 2015-12-18 DIAGNOSIS — Z85828 Personal history of other malignant neoplasm of skin: Secondary | ICD-10-CM | POA: Diagnosis not present

## 2015-12-18 DIAGNOSIS — Z08 Encounter for follow-up examination after completed treatment for malignant neoplasm: Secondary | ICD-10-CM | POA: Diagnosis not present

## 2015-12-23 ENCOUNTER — Other Ambulatory Visit: Payer: Self-pay | Admitting: Family Medicine

## 2015-12-23 ENCOUNTER — Telehealth: Payer: Self-pay | Admitting: *Deleted

## 2015-12-23 NOTE — Telephone Encounter (Signed)
PA approved effective 10/24/2015 through 12/22/2016. [id number] Approval letter sent for scanning.

## 2015-12-23 NOTE — Telephone Encounter (Signed)
PA initiated on [email or web address] Key: [id number] Awaiting determination

## 2016-02-19 ENCOUNTER — Other Ambulatory Visit: Payer: Self-pay | Admitting: Family Medicine

## 2016-03-02 ENCOUNTER — Other Ambulatory Visit: Payer: Self-pay | Admitting: Family Medicine

## 2016-03-03 ENCOUNTER — Other Ambulatory Visit: Payer: Self-pay | Admitting: Family Medicine

## 2016-03-03 DIAGNOSIS — Z23 Encounter for immunization: Secondary | ICD-10-CM | POA: Diagnosis not present

## 2016-03-03 MED ORDER — TESTOSTERONE 30 MG/ACT TD SOLN: TRANSDERMAL | 1 refills | Status: DC

## 2016-03-03 NOTE — Telephone Encounter (Signed)
Faxed hardcopy to [facility]

## 2016-05-16 ENCOUNTER — Ambulatory Visit: Payer: Medicare Other | Admitting: Family Medicine

## 2016-05-18 ENCOUNTER — Other Ambulatory Visit: Payer: Self-pay | Admitting: Family Medicine

## 2016-05-18 DIAGNOSIS — I10 Essential (primary) hypertension: Secondary | ICD-10-CM

## 2016-05-18 DIAGNOSIS — E785 Hyperlipidemia, unspecified: Secondary | ICD-10-CM

## 2016-05-18 NOTE — Telephone Encounter (Signed)
Refill sent per [facility] refill protocol/SLS  

## 2016-05-19 ENCOUNTER — Ambulatory Visit (INDEPENDENT_AMBULATORY_CARE_PROVIDER_SITE_OTHER): Payer: Medicare Other | Admitting: Family Medicine

## 2016-05-19 ENCOUNTER — Encounter: Payer: Self-pay | Admitting: Family Medicine

## 2016-05-19 VITALS — BP 116/76 | HR 50 | Temp 97.9°F | Resp 14 | Ht 70.0 in | Wt 158.0 lb

## 2016-05-19 DIAGNOSIS — E039 Hypothyroidism, unspecified: Secondary | ICD-10-CM | POA: Diagnosis not present

## 2016-05-19 DIAGNOSIS — K219 Gastro-esophageal reflux disease without esophagitis: Secondary | ICD-10-CM

## 2016-05-19 DIAGNOSIS — Z0000 Encounter for general adult medical examination without abnormal findings: Secondary | ICD-10-CM | POA: Diagnosis not present

## 2016-05-19 DIAGNOSIS — R140 Abdominal distension (gaseous): Secondary | ICD-10-CM

## 2016-05-19 DIAGNOSIS — E785 Hyperlipidemia, unspecified: Secondary | ICD-10-CM | POA: Diagnosis not present

## 2016-05-19 DIAGNOSIS — I10 Essential (primary) hypertension: Secondary | ICD-10-CM

## 2016-05-19 DIAGNOSIS — K5730 Diverticulosis of large intestine without perforation or abscess without bleeding: Secondary | ICD-10-CM

## 2016-05-19 HISTORY — DX: Abdominal distension (gaseous): R14.0

## 2016-05-19 MED ORDER — LORAZEPAM 0.5 MG PO TABS: 0.5000 mg | ORAL_TABLET | Freq: Two times a day (BID) | ORAL | 0 refills | Status: DC | PRN

## 2016-05-19 MED ORDER — ATORVASTATIN CALCIUM 20 MG PO TABS: 20.0000 mg | ORAL_TABLET | Freq: Every day | ORAL | 1 refills | Status: DC

## 2016-05-19 MED ORDER — SILDENAFIL CITRATE 20 MG PO TABS: ORAL_TABLET | ORAL | 1 refills | Status: DC

## 2016-05-19 MED ORDER — ZOLPIDEM TARTRATE 10 MG PO TABS: 5.0000 mg | ORAL_TABLET | Freq: Every evening | ORAL | 5 refills | Status: DC | PRN

## 2016-05-19 MED ORDER — PANTOPRAZOLE SODIUM 40 MG PO TBEC: 40.0000 mg | DELAYED_RELEASE_TABLET | Freq: Every day | ORAL | 1 refills | Status: DC

## 2016-05-19 MED ORDER — TESTOSTERONE 30 MG/ACT TD SOLN: TRANSDERMAL | 1 refills | Status: DC

## 2016-05-19 NOTE — Progress Notes (Signed)
Subjective:   James [name] is a 75 y.o. male who presents for Medicare Annual/Subsequent preventive examination.  Review of Systems:  No ROS.  Medicare Wellness Visit.  Cardiac Risk Factors include: advanced age (>19men, >48 women);dyslipidemia;male gender  Sleep patterns: No sleep issues. Uses Ambien PRN. Does not get to void.   Home Safety/Smoke Alarms: Lives in house w/ 'lady friend.' Smoke detectors in home. Feels safe in home. Living environment; residence and Firearm Safety: Stored in locked cabinet/closet. Seat Belt Safety/Bike Helmet: Wears seat belt.   Counseling:   Eye Exam- sees eye doctor PRN only Dental- Dr. Wallace Going every 6 months  Male:   CCS- last 11/16/11, diverticulosis    PSA-  Lab Results  Component Value Date   PSA 0.51 04/23/2015   PSA 0.62 03/11/2014   PSA 0.88 01/30/2013       Objective:    Vitals: BP 116/76 (BP Location: Left Arm, Patient Position: Sitting, Cuff Size: Normal)   Pulse (!) 50   Temp 97.9 F (36.6 C) (Oral)   Resp 14   Ht 5\' 10"  (1.778 m)   Wt 158 lb (71.7 kg)   SpO2 97%   BMI 22.67 kg/m   Body mass index is 22.67 kg/m.  Tobacco History  Smoking Status  . Never Smoker  Smokeless Tobacco  . Never Used     Counseling given: Yes   Past Medical History:  Diagnosis Date  . AAA (abdominal aortic aneurysm) without rupture ([place]) 09/08/2013  . Abdominal bloating 05/19/2016  . Anxiety state 05/18/2014  . Atrial fibrillation ([place])   . 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 Benign paroxysmal positional vertigo 04/06/2013  . CAD (coronary artery disease)    nonobstructive by cath 2008; nonobstructive by Cardiac CT 11/2011  . Cervical spine fracture (HCC) 1980   C6-7  . Diverticulosis   . Erectile dysfunction 09/08/2013  . GERD (gastroesophageal reflux disease)   . Hiatal hernia   . History of atrial fibrillation   . History of squamous cell carcinoma 10-05-10   Dr [name], [place]  . HLD  (hyperlipidemia)   . Hypothyroidism   . Medicare annual wellness visit, subsequent 03/16/2014   Sees [facility] ortho Follows with Dr Johnsie Cancel of cardiology Sees [facility] for colonoscopy, last colonoscopy in 2013   . Mitral valve prolapse   . Neck pain 05/03/2015  . Preventative health care 03/16/2014  . 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 Syncope 02/03/2013  . 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   Past Surgical History:  Procedure Laterality Date  . APPENDECTOMY  05/2011  . CARDIAC CATHETERIZATION  01/03/12   30% ostial LAD stenosis, ostial 30-40% D1 stenosis, smooth and normal left main and RCA; LVEF 65%  . CHOLECYSTECTOMY N/A 08/25/2014   Procedure: LAPAROSCOPIC CHOLECYSTECTOMY WITH INTRAOPERATIVE CHOLANGIOGRAM;  Surgeon: [name], MD;  Location: [facility];  Service: General;  Laterality: N/A;  . INGUINAL HERNIA REPAIR  07/2010   right  . KNEE ARTHROPLASTY     bilat  . LEFT HEART CATHETERIZATION WITH CORONARY ANGIOGRAM N/A 01/03/2012   Procedure: LEFT HEART CATHETERIZATION WITH CORONARY ANGIOGRAM;  Surgeon: [name], MD;  Location[facility] LAB;  Service: Cardiovascular;  Laterality: N/A;  . TONSILLECTOMY  1947  . tumor removal  1958   "fatty tumor"   Family History  Problem Relation Age of Onset  . Heart disease Mother     MI 50s  . Stroke Mother   .  Hypertension Mother   . Heart disease Maternal Grandfather     MI 70-80s  . Heart disease Maternal Grandmother     MI 70-80s  . Lung disease Father     penumonitis  . Cancer Sister     colon  . Cancer Paternal Uncle     GI: colon he thinks   History  Sexual Activity  . Sexual activity: Yes    Outpatient Encounter Prescriptions as of 05/19/2016  Medication Sig  . aspirin EC 81 MG tablet Take 81 mg by mouth daily.  Marland Kitchen atorvastatin (LIPITOR) 20 MG tablet Take 1 tablet (20 mg total) by mouth daily at 6 PM.  . Calcium Carb-Cholecalciferol (CALCIUM 1000 + D PO) Take 1 tablet by mouth  daily.  . Dutasteride-Tamsulosin HCl (JALYN) 0.5-0.4 MG CAPS Take 1 capsule by mouth at bedtime.  Marland Kitchen levothyroxine (SYNTHROID, LEVOTHROID) 100 MCG tablet TAKE 1 TABLET (100 MCG TOTAL) BY MOUTH DAILY BEFORE BREAKFAST.  Marland Kitchen levothyroxine (SYNTHROID, LEVOTHROID) 100 MCG tablet TAKE 1 TABLET EVERY MORNING ON AN EMPTY STOMACH  . LORazepam (ATIVAN) 0.5 MG tablet Take 1 tablet (0.5 mg total) by mouth 2 (two) times daily as needed for anxiety.  . meclizine (ANTIVERT) 25 MG tablet Take 1 tablet (25 mg total) by mouth 2 (two) times daily as needed for dizziness or nausea.  . Milk Thistle 1000 MG CAPS Take 1 capsule by mouth daily.  . mirtazapine (REMERON) 15 MG tablet Take 1 tablet (15 mg total) by mouth at bedtime.  . Multiple Vitamins-Minerals (MULTIVITAMINS THER. W/MINERALS) TABS Take 1 tablet by mouth daily.  . nitroGLYCERIN (NITROSTAT) 0.4 MG SL tablet Place 1 tablet (0.4 mg total) under the tongue every 5 (five) minutes x 3 doses as needed for chest pain.  . pantoprazole (PROTONIX) 40 MG tablet Take 1 tablet (40 mg total) by mouth daily.  . Probiotic Product (PROBIOTIC DAILY PO) Take by mouth.  . ranitidine (ZANTAC) 150 MG capsule Take 1 capsule (150 mg total) by mouth 2 (two) times daily.  . sildenafil (REVATIO) 20 MG tablet TAKE 1 TO 5 TABLETS BY MOUTH EVERY DAY AS NEEDED  . Testosterone (AXIRON) 30 MG/ACT SOLN APPLY 1 PUMP UNDER EACH ARM EVERY MORNING AS DIRECTED  . zolpidem (AMBIEN) 10 MG tablet Take 0.5-1 tablets (5-10 mg total) by mouth at bedtime as needed for sleep.  . [DISCONTINUED] atorvastatin (LIPITOR) 20 MG tablet TAKE ONE (1) TABLET EACH DAY AT 6PM FOR CHOLESTEROL  . [DISCONTINUED] LORazepam (ATIVAN) 0.5 MG tablet Take 1 tablet (0.5 mg total) by mouth 2 (two) times daily as needed for anxiety.  . [DISCONTINUED] MILK THISTLE PO Take by mouth.  . [DISCONTINUED] pantoprazole (PROTONIX) 40 MG tablet TAKE 1 TABLET BY MOUTH EVERY DAY FOR ACID REFLUX  . [DISCONTINUED] sildenafil (REVATIO) 20 MG  tablet TAKE 1 TO 5 TABLETS BY MOUTH EVERY DAY AS NEEDED  . [DISCONTINUED] Testosterone (AXIRON) 30 MG/ACT SOLN APPLY 1 PUMP UNDER EACH ARM EVERY MORNING AS DIRECTED  . [DISCONTINUED] zolpidem (AMBIEN) 10 MG tablet Take 0.5-1 tablets (5-10 mg total) by mouth at bedtime as needed for sleep.   No facility-administered encounter medications on file as of 05/19/2016.     Activities of Daily Living In your present state of health, do you have any difficulty performing the following activities: 05/19/2016 11/13/2015  Hearing? N N  Vision? N N  Difficulty concentrating or making decisions? N N  Walking or climbing stairs? N N  Dressing or bathing? N N  Doing errands,  shopping? N N  Preparing Food and eating ? N -  Using the Toilet? N -  In the past six months, have you accidently leaked urine? N -  Do you have problems with loss of bowel control? N -  Managing your Medications? N -  Managing your Finances? N -  Housekeeping or managing your Housekeeping? N -  Some recent data might be hidden    Patient Care Team: [name], MD as PCP - General (Family Medicine) Nani Ravens, MD as Referring Physician (Dermatology) [name], MD as Consulting Physician (Cardiology) Carol Ada, MD as Consulting Physician (Gastroenterology)   Assessment:    Physical assessment deferred to PCP.  Hearing/Vision Hearing Screening Comments: Able to hear conversational tones w/o difficulty. No issues reported. Tinnitus in L ear. Vision Screening Comments: No issues reported. Wears reading glasses. Follows w/ eye doctor PRN only.  Exercise Activities and Dietary recommendations Current Exercise Habits: Home exercise routine, Time (Minutes): > 60, Frequency (Times/Week): 3, Weekly Exercise (Minutes/Week): 0, Intensity: Intense  Diet (meal preparation, eat out, water intake, caffeinated beverages, dairy products, fruits and vegetables): in general, a "healthy" diet  , well balanced, on  average, 3 meals per day. Drinks water throughout the day.    Breakfast: Egg whites, potatoes, coffee Lunch: Bowl of Cheerios w/ soy milk Dinner: [place]uss[place] sprouts, grapes, oranges, cheese, nuts  Goals    . Healthy Lifestyle          Continue to eat heart healthy diet (full of fruits, vegetables, whole grains, lean protein, water--limit salt, fat, and sugar intake) and increase physical activity as tolerated. Continue doing brain stimulating activities (puzzles, reading, adult coloring books, staying active) to keep memory sharp.      . Increase physical activity          Continue running 5ks.  Participate in [employer].      Fall Risk Fall Risk  05/19/2016 04/23/2015 03/11/2014  Falls in the past year? No No No   Depression Screen PHQ 2/9 Scores 05/19/2016 04/23/2015 03/11/2014  PHQ - 2 Score 0 0 0    Cognitive Function MMSE - Mini Mental State Exam 05/19/2016  Orientation to time 5  Orientation to Place 5  Registration 3  Attention/ Calculation 5  Recall 3  Language- name 2 objects 2  Language- repeat 1  Language- follow 3 step command 3  Language- read & follow direction 1  Write a sentence 1  Copy design 1  Total score 30        Immunization History  Administered Date(s) Administered  . Influenza Whole 03/20/2012  . Influenza, High Dose Seasonal PF 04/02/2013, 04/23/2015  . Influenza,inj,Quad PF,36+ Mos 03/11/2014  . Influenza-Unspecified 03/03/2016  . Pneumococcal Conjugate-13 03/11/2014  . Pneumococcal Polysaccharide-23 04/18/2009  . Tdap 08/05/2009  . Zoster 09/10/2008   Screening Tests Health Maintenance  Topic Date Due  . TETANUS/TDAP  08/06/2019  . INFLUENZA VACCINE  Completed  . ZOSTAVAX  Completed  . PNA vac Low Risk Adult  Completed      Plan:   Follow-up w/ Dr. Charlett Blake as directed. Records release signed and faxed for Dr. Jonne Ply for previous colonoscopy records.  Bring a copy of your advance directives to your next office  visit. Schedule follow-up w/ Dr. Aleen Campi for AAA duplex in 2018 per last note.  During the course of the visit the patient was educated and counseled about the following appropriate screening and preventive services:   Vaccines to include  Pneumoccal, Influenza, Hepatitis B, Td, Zostavax, HCV  Cardiovascular Disease  Colorectal cancer screening  Diabetes screening  Prostate Cancer Screening  Glaucoma screening  Nutrition counseling   Patient Instructions (the written plan) was given to the patient.    Dorrene German, RN  05/19/2016   RN AWV note reviewed. Agree with documention and plan.  Penni Homans, MD

## 2016-05-19 NOTE — Progress Notes (Signed)
Pre visit review using our clinic review tool, if applicable. No additional management support is needed unless otherwise documented below in the visit note. 

## 2016-05-19 NOTE — Assessment & Plan Note (Signed)
Occurs some days and usually starts mid morning and subsides by early evening. Tried altering his probiotics cannot associate it with eating certain foods.

## 2016-05-19 NOTE — Patient Instructions (Addendum)
Call to make a follow-up appt w/ Dr. Acie Fredrickson.  Bring a copy of your advance directives to your next office visit. NOW probiotics, 1 cap daily Salon pas or aspercreme makes a lidocaine topical treatments   Try Zantac/Ranitidine 150 mg at bedtime for heartburn Try Zyrtec 10 mg for allergies at bedtime Hypertension Hypertension is another name for high blood pressure. High blood pressure forces your heart to work harder to pump blood. A blood pressure reading has two numbers, which includes a higher number over a lower number (example: 110/72). Follow these instructions at home:  Have your blood pressure rechecked by your doctor.  Only take medicine as told by your doctor. Follow the directions carefully. The medicine does not work as well if you skip doses. Skipping doses also puts you at risk for problems.  Do not smoke.  Monitor your blood pressure at home as told by your doctor. Contact a doctor if:  You think you are having a reaction to the medicine you are taking.  You have repeat headaches or feel dizzy.  You have puffiness (swelling) in your ankles.  You have trouble with your vision. Get help right away if:  You get a very bad headache and are confused.  You feel weak, numb, or faint.  You get chest or belly (abdominal) pain.  You throw up (vomit).  You cannot breathe very well. This information is not intended to replace advice given to you by your health care provider. Make sure you discuss any questions you have with your health care provider. Document Released: 11/23/2007 Document Revised: 11/12/2015 Document Reviewed: 03/29/2013 Elsevier Interactive Patient Education  2017 [place]. atments you can use on thumbs

## 2016-05-26 ENCOUNTER — Encounter: Payer: Self-pay | Admitting: Family Medicine

## 2016-05-26 ENCOUNTER — Other Ambulatory Visit: Payer: Self-pay

## 2016-05-29 NOTE — Assessment & Plan Note (Signed)
Tolerating statin, encouraged heart healthy diet, avoid trans fats, minimize simple carbs and saturated fats. Increase exercise as tolerated 

## 2016-05-29 NOTE — Progress Notes (Signed)
Patient ID: James [name], male   DOB: 1941/03/15, 75 y.o.   MRN: [id number]   Subjective:    Patient ID: James [name], male    DOB: 08-Jun-1941, 75 y.o.   MRN: [id number]  Chief Complaint  Patient presents with  . Follow-up    HPI Patient is in today for follow up on numerous medical concerns. No recent illness or hospitalizations. He notes some occasional abdominal discomfort and sinuse drainage with mild cough but no fevers or other signs of acute illness. He also struggles with arthritic pain in thumbs. Stiffness and pain noted. Denies CP/palp/SOB/HA/congestion/fevers/GI or GU c/o. Taking meds as prescribed  Past Medical History:  Diagnosis Date  . AAA (abdominal aortic aneurysm) without rupture ([place]) 09/08/2013  . Abdominal bloating 05/19/2016  . Anxiety state 05/18/2014  . Atrial fibrillation ([place])   . 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 Benign paroxysmal positional vertigo 04/06/2013  . CAD (coronary artery disease)    nonobstructive by cath 2008; nonobstructive by Cardiac CT 11/2011  . Cervical spine fracture ([facility]CC) 1980   C6-7  . Diverticulosis   . Erectile dysfunction 09/08/2013  . GERD (gastroesophageal reflux disease)   . Hiatal hernia   . History of atrial fibrillation   . History of squamous cell carcinoma 10-05-10   Dr [name], [place]  . HLD (hyperlipidemia)   . Hypothyroidism   . Medicare annual wellness visit, subsequent 03/16/2014   Sees [facility] Follows with Dr Johnsie Cancel of cardiology Sees [facility] for colonoscopy, last colonoscopy in 2013   . Mitral valve prolapse   . Neck pain 05/03/2015  . Preventative health care 03/16/2014  . 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 Syncope 02/03/2013  . 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    Past Surgical History:  Procedure Laterality Date  . APPENDECTOMY  05/2011  . CARDIAC CATHETERIZATION  01/03/12   30% ostial LAD stenosis, ostial 30-40% D1 stenosis, smooth and normal  left main and RCA; LVEF 65%  . CHOLECYSTECTOMY N/A 08/25/2014   Procedure: LAPAROSCOPIC CHOLECYSTECTOMY WITH INTRAOPERATIVE CHOLANGIOGRAM;  Surgeon: [name], MD;  Location: [facility];  Service: General;  Laterality: N/A;  . INGUINAL HERNIA REPAIR  07/2010   right  . KNEE ARTHROPLASTY     bilat  . LEFT HEART CATHETERIZATION WITH CORONARY ANGIOGRAM N/A 01/03/2012   Procedure: LEFT HEART CATHETERIZATION WITH CORONARY ANGIOGRAM;  Surgeon: [name], MD;  Location: [facility] CATH LAB;  Service: Cardiovascular;  Laterality: N/A;  . TONSILLECTOMY  1947  . tumor removal  1958   "fatty tumor"    Family History  Problem Relation Age of Onset  . Heart disease Mother     MI 70s  . Stroke Mother   . Hypertension Mother   . Heart disease Maternal Grandfather     MI 70-80s  . Heart disease Maternal Grandmother     MI 70-80s  . Lung disease Father     penumonitis  . Cancer Sister     colon  . Cancer Paternal Uncle     GI: colon he thinks    Social History   Social History  . Marital status: Single    Spouse name: N/A  . Number of children: N/A  . Years of education: N/A   Occupational History  . Chief Financial Officer     Retired   Social History Main Topics  . Smoking status: Never Smoker  . Smokeless tobacco: Never Used  . Alcohol  use Yes     Comment: 2 glasses red wine/day  . Drug use: No  . Sexual activity: Yes   Other Topics Concern  . Not on file   Social History Narrative  . No narrative on file    Outpatient Medications Prior to Visit  Medication Sig Dispense Refill  . aspirin EC 81 MG tablet Take 81 mg by mouth daily.    . Calcium Carb-Cholecalciferol (CALCIUM 1000 + D PO) Take 1 tablet by mouth daily.    . Dutasteride-Tamsulosin HCl (JALYN) 0.5-0.4 MG CAPS Take 1 capsule by mouth at bedtime. 90 capsule 2  . levothyroxine (SYNTHROID, LEVOTHROID) 100 MCG tablet TAKE 1 TABLET (100 MCG TOTAL) BY MOUTH DAILY BEFORE BREAKFAST. 30 tablet 6  . levothyroxine (SYNTHROID, LEVOTHROID)  100 MCG tablet TAKE 1 TABLET EVERY MORNING ON AN EMPTY STOMACH 30 tablet 3  . meclizine (ANTIVERT) 25 MG tablet Take 1 tablet (25 mg total) by mouth 2 (two) times daily as needed for dizziness or nausea. 30 tablet 2  . mirtazapine (REMERON) 15 MG tablet Take 1 tablet (15 mg total) by mouth at bedtime. 90 tablet 2  . Multiple Vitamins-Minerals (MULTIVITAMINS THER. W/MINERALS) TABS Take 1 tablet by mouth daily.    . nitroGLYCERIN (NITROSTAT) 0.4 MG SL tablet Place 1 tablet (0.4 mg total) under the tongue every 5 (five) minutes x 3 doses as needed for chest pain. 25 tablet 3  . Probiotic Product (PROBIOTIC DAILY PO) Take by mouth.    . ranitidine (ZANTAC) 150 MG capsule Take 1 capsule (150 mg total) by mouth 2 (two) times daily. 60 capsule 0  . atorvastatin (LIPITOR) 20 MG tablet TAKE ONE (1) TABLET EACH DAY AT 6PM FOR CHOLESTEROL 90 tablet 0  . LORazepam (ATIVAN) 0.5 MG tablet Take 1 tablet (0.5 mg total) by mouth 2 (two) times daily as needed for anxiety. 30 tablet 0  . pantoprazole (PROTONIX) 40 MG tablet TAKE 1 TABLET BY MOUTH EVERY DAY FOR ACID REFLUX 90 tablet 0  . sildenafil (REVATIO) 20 MG tablet TAKE 1 TO 5 TABLETS BY MOUTH EVERY DAY AS NEEDED 35 tablet 1  . Testosterone (AXIRON) 30 MG/ACT SOLN APPLY 1 PUMP UNDER EACH ARM EVERY MORNING AS DIRECTED 90 mL 1  . zolpidem (AMBIEN) 10 MG tablet Take 0.5-1 tablets (5-10 mg total) by mouth at bedtime as needed for sleep. 30 tablet 5   No facility-administered medications prior to visit.     Allergies  Allergen Reactions  . Sulfa Antibiotics Other (See Comments)    Childhood allergy    Review of Systems  Constitutional: Negative for fever and malaise/fatigue.  HENT: Negative for congestion.   Eyes: Negative for blurred vision.  Respiratory: Negative for shortness of breath.   Cardiovascular: Negative for chest pain, palpitations and leg swelling.  Gastrointestinal: Positive for abdominal pain. Negative for blood in stool and nausea.    Genitourinary: Negative for dysuria and frequency.  Musculoskeletal: Positive for joint pain. Negative for falls.  Skin: Negative for rash.  Neurological: Negative for dizziness, loss of consciousness and headaches.  Endo/Heme/Allergies: Negative for environmental allergies.  Psychiatric/Behavioral: Negative for depression. The patient is not nervous/anxious.        Objective:    Physical Exam  Constitutional: He is oriented to person, place, and time. He appears well-developed and well-nourished. No distress.  HENT:  Head: Normocephalic and atraumatic.  Eyes: Conjunctivae are normal.  Neck: Neck supple. No thyromegaly present.  Cardiovascular: Normal rate, regular rhythm and normal heart  sounds.   No murmur heard. Pulmonary/Chest: Effort normal and breath sounds normal. No respiratory distress. He has no wheezes.  Abdominal: Soft. Bowel sounds are normal. He exhibits no mass. There is no tenderness.  Musculoskeletal: He exhibits no edema.  Lymphadenopathy:    He has no cervical adenopathy.  Neurological: He is alert and oriented to person, place, and time.  Skin: Skin is warm and dry.  Psychiatric: He has a normal mood and affect. His behavior is normal.    BP 116/76 (BP Location: Left Arm, Patient Position: Sitting, Cuff Size: Normal)   Pulse (!) 50   Temp 97.9 F (36.6 C) (Oral)   Resp 14   Ht 5\' 10"  (1.778 m)   Wt 158 lb (71.7 kg)   SpO2 97%   BMI 22.67 kg/m  Wt Readings from Last 3 Encounters:  05/19/16 158 lb (71.7 kg)  12/03/15 156 lb 12.8 oz (71.1 kg)  11/13/15 154 lb 6 oz (70 kg)     Lab Results  Component Value Date   WBC 7.7 12/03/2015   HGB 13.3 12/03/2015   HCT 39.4 12/03/2015   PLT 197.0 12/03/2015   GLUCOSE 85 12/03/2015   CHOL 115 (L) 11/13/2015   TRIG 151 (H) 11/13/2015   HDL 58 11/13/2015   LDLCALC 27 11/13/2015   ALT 15 12/03/2015   AST 19 12/03/2015   NA 137 12/03/2015   K 4.0 12/03/2015   CL 103 12/03/2015   CREATININE 0.90  12/03/2015   BUN 12 12/03/2015   CO2 31 12/03/2015   TSH 0.80 11/13/2015   PSA 0.51 04/23/2015   INR 1.09 08/21/2014    Lab Results  Component Value Date   TSH 0.80 11/13/2015   Lab Results  Component Value Date   WBC 7.7 12/03/2015   HGB 13.3 12/03/2015   HCT 39.4 12/03/2015   MCV 94.4 12/03/2015   PLT 197.0 12/03/2015   Lab Results  Component Value Date   NA 137 12/03/2015   K 4.0 12/03/2015   CO2 31 12/03/2015   GLUCOSE 85 12/03/2015   BUN 12 12/03/2015   CREATININE 0.90 12/03/2015   BILITOT 0.6 12/03/2015   ALKPHOS 56 12/03/2015   AST 19 12/03/2015   ALT 15 12/03/2015   PROT 6.9 12/03/2015   ALBUMIN 4.1 12/03/2015   CALCIUM 9.4 12/03/2015   ANIONGAP 6 08/21/2014   GFR 87.20 12/03/2015   Lab Results  Component Value Date   CHOL 115 (L) 11/13/2015   Lab Results  Component Value Date   HDL 58 11/13/2015   Lab Results  Component Value Date   LDLCALC 27 11/13/2015   Lab Results  Component Value Date   TRIG 151 (H) 11/13/2015   Lab Results  Component Value Date   CHOLHDL 2.0 11/13/2015   No results found for: HGBA1C     Assessment & Plan:   Problem List Items Addressed This Visit    Hyperlipidemia    Tolerating statin, encouraged heart healthy diet, avoid trans fats, minimize simple carbs and saturated fats. Increase exercise as tolerated      Relevant Medications   sildenafil (REVATIO) 20 MG tablet   atorvastatin (LIPITOR) 20 MG tablet   Hypothyroidism    On Levothyroxine, continue to monitor      GERD (gastroesophageal reflux disease)    Avoid offending foods, start probiotics. Do not eat large meals in late evening and consider raising head of bed.       Relevant Medications   pantoprazole (PROTONIX)  40 MG tablet   Diverticulosis of colon without hemorrhage    Asymptomatic, maintain adequate hydration, fiber and exercise      Abdominal bloating    Occurs some days and usually starts mid morning and subsides by early evening. Tried  altering his probiotics cannot associate it with eating certain foods.        Other Visit Diagnoses    Encounter for Medicare annual wellness exam    -  Primary   Essential hypertension       Relevant Medications   sildenafil (REVATIO) 20 MG tablet   atorvastatin (LIPITOR) 20 MG tablet      I have discontinued Mr. Waites's MILK THISTLE PO. I have also changed his pantoprazole and atorvastatin. Additionally, I am having him maintain his multivitamins ther. w/minerals, Calcium Carb-Cholecalciferol (CALCIUM 1000 + D PO), meclizine, nitroGLYCERIN, Probiotic Product (PROBIOTIC DAILY PO), aspirin EC, levothyroxine, Dutasteride-Tamsulosin HCl, mirtazapine, ranitidine, levothyroxine, zolpidem, Testosterone, sildenafil, LORazepam, and Milk Thistle.  Meds ordered this encounter  Medications  . DISCONTD: MILK THISTLE PO    Sig: Take by mouth.  . zolpidem (AMBIEN) 10 MG tablet    Sig: Take 0.5-1 tablets (5-10 mg total) by mouth at bedtime as needed for sleep.    Dispense:  30 tablet    Refill:  5  . Testosterone (AXIRON) 30 MG/ACT SOLN    Sig: APPLY 1 PUMP UNDER EACH ARM EVERY MORNING AS DIRECTED    Dispense:  90 mL    Refill:  1  . sildenafil (REVATIO) 20 MG tablet    Sig: TAKE 1 TO 5 TABLETS BY MOUTH EVERY DAY AS NEEDED    Dispense:  35 tablet    Refill:  1  . pantoprazole (PROTONIX) 40 MG tablet    Sig: Take 1 tablet (40 mg total) by mouth daily.    Dispense:  90 tablet    Refill:  1  . LORazepam (ATIVAN) 0.5 MG tablet    Sig: Take 1 tablet (0.5 mg total) by mouth 2 (two) times daily as needed for anxiety.    Dispense:  30 tablet    Refill:  0  . atorvastatin (LIPITOR) 20 MG tablet    Sig: Take 1 tablet (20 mg total) by mouth daily at 6 PM.    Dispense:  90 tablet    Refill:  1  . Milk Thistle 1000 MG CAPS    Sig: Take 1 capsule by mouth daily.     Penni Homans, MD

## 2016-05-29 NOTE — Assessment & Plan Note (Signed)
On Levothyroxine, continue to monitor 

## 2016-05-29 NOTE — Assessment & Plan Note (Signed)
Asymptomatic, maintain adequate hydration, fiber and exercise

## 2016-05-29 NOTE — Assessment & Plan Note (Signed)
Avoid offending foods, start probiotics. Do not eat large meals in late evening and consider raising head of bed.  

## 2016-07-19 ENCOUNTER — Other Ambulatory Visit: Payer: Self-pay | Admitting: Family Medicine

## 2016-07-19 NOTE — Telephone Encounter (Signed)
He needs a testosterone check in lab then we can refill, this should be checked every 6 months on this med

## 2016-08-01 ENCOUNTER — Other Ambulatory Visit: Payer: Self-pay | Admitting: Family Medicine

## 2016-08-01 NOTE — Telephone Encounter (Signed)
Last office visit 05/19/2016

## 2016-08-01 NOTE — Telephone Encounter (Signed)
He can have a refill on the testosterone but he should also have a testosterone level drawn when he is able.

## 2016-08-02 NOTE — Telephone Encounter (Signed)
Faxed hardcopy to [facility]. Patient aware to schedule appt/labs. For further refills

## 2016-08-24 ENCOUNTER — Ambulatory Visit (INDEPENDENT_AMBULATORY_CARE_PROVIDER_SITE_OTHER): Payer: Medicare Other | Admitting: Internal Medicine

## 2016-08-24 ENCOUNTER — Telehealth: Payer: Self-pay | Admitting: Family Medicine

## 2016-08-24 ENCOUNTER — Ambulatory Visit (HOSPITAL_BASED_OUTPATIENT_CLINIC_OR_DEPARTMENT_OTHER)
Admission: RE | Admit: 2016-08-24 | Discharge: 2016-08-24 | Disposition: A | Discharge status: Home / Self Care | Payer: Medicare Other | Source: Ambulatory Visit | Attending: Internal Medicine | Admitting: Internal Medicine

## 2016-08-24 ENCOUNTER — Encounter: Payer: Self-pay | Admitting: Internal Medicine

## 2016-08-24 VITALS — BP 124/78 | HR 48 | Temp 97.6°F | Resp 14 | Ht 70.0 in | Wt 156.0 lb

## 2016-08-24 DIAGNOSIS — R079 Chest pain, unspecified: Secondary | ICD-10-CM

## 2016-08-24 DIAGNOSIS — I2583 Coronary atherosclerosis due to lipid rich plaque: Secondary | ICD-10-CM | POA: Diagnosis not present

## 2016-08-24 DIAGNOSIS — I2510 Atherosclerotic heart disease of native coronary artery without angina pectoris: Secondary | ICD-10-CM | POA: Diagnosis not present

## 2016-08-24 DIAGNOSIS — R918 Other nonspecific abnormal finding of lung field: Secondary | ICD-10-CM | POA: Insufficient documentation

## 2016-08-24 DIAGNOSIS — R0789 Other chest pain: Secondary | ICD-10-CM | POA: Diagnosis not present

## 2016-08-24 MED ORDER — PANTOPRAZOLE SODIUM 40 MG PO TBEC: 40.0000 mg | DELAYED_RELEASE_TABLET | Freq: Two times a day (BID) | ORAL | 3 refills | Status: DC

## 2016-08-24 NOTE — Telephone Encounter (Signed)
Patient Name: RHYTHM WIGFALL  DOB: 26-Jan-1941    Initial Comment Caller has been having chest discomfort, seems like it has gotten worse.    Nurse Assessment  Nurse: Julien Girt, RN, Almyra Free Date/Time Eilene Ghazi Time): 08/24/2016 8:55:06 AM  Confirm and document reason for call. If symptomatic, describe symptoms. ---Caller states he has been having chest pain for a few weeks and it has gotten worse. He is taking Gaviscon and it does help. States he has a hx of an inflamed esophagus, his stomach feels raw and this pain gets worse in the afternoon and evening. He is having discomfort at this time. Caller was driving, I ask him to pull over for triage.  Does the patient have any new or worsening symptoms? ---Yes  Will a triage be completed? ---Yes  Related visit to physician within the last 2 weeks? ---No  Does the PT have any chronic conditions? (i.e. diabetes, asthma, etc.) ---Yes  List chronic conditions. ---GERD, High cholesterol, Hypothyroid, Hx chest pain  Is this a behavioral health or substance abuse call? ---No     Guidelines    Guideline Title Affirmed Question Affirmed Notes  Chest Pain [1] Chest pain lasts > 5 minutes AND [2] age > 70    Final Disposition User   Call EMS 911 Now Julien Girt, RN, Almyra Free    Disagree/Comply: Disagree  Disagree/Comply Reason: Disagree with instructions   Reason: Mr, Swire refuses to call 911 or to go to ED at this time. Advised that I will notify his PCP and he can anticipate a cb. Verbalized understanding.

## 2016-08-24 NOTE — Telephone Encounter (Signed)
Noted, thx.

## 2016-08-24 NOTE — Progress Notes (Signed)
Pre visit review using our clinic review tool, if applicable. No additional management support is needed unless otherwise documented below in the visit note. 

## 2016-08-24 NOTE — Telephone Encounter (Signed)
Pt called in to schedule an appt with the complaint of chest pain. Forwarded pt to [facility] for triaging.

## 2016-08-24 NOTE — Telephone Encounter (Signed)
Patient reported that 3 weeks ago when traveling in a stressful situation, he noticed the onset of a pinched esophagus. He voiced that he's experienced this in the past and typically have some relief if he takes Gaviscon or burps a lot. Also, patient mentioned that he does have a past history of acid reflux. Currently, the patient denies chest pain, difficulty breathing, rapid/irregular heart beat, dizziness/lightheadedness, weakness and numbness/tingling in limbs. However, he voiced that his throat feels raw & have some chest discomfort; it mainly occurs in the afternoon and has seem a lot worse within the last few days. Appointment scheduled for 08/24/16 at 2:15 PM with Dr. Larose Kells. Advised patient that if symptoms worsen or become more severe (e.g. chest pain), please seek the emergency department. He verbalized understanding and did not have any further concerns prior to call ending. Message routed to Dr. Larose Kells for review.

## 2016-08-24 NOTE — Progress Notes (Signed)
Subjective:    Patient ID: James [name], male    DOB: 1940/08/29, 76 y.o.   MRN: [id number]  DOS:  08/24/2016 Type of visit - description : Acute visit, "pinched  esophagus". Interval history: Symptoms started 3 weeks ago: Has anterior mid chest pain, described as dull or burning, has pain  daily, usually starts mid morning and lasts till he goes to bed. Decrease when he burps, he try Gaviscon and also make him feel better. He is on PPIs daily, increased to 2 times a day in the last few days he feels that it made a difference. Denies any exertional symptoms. Pain does not radiate per se but has noted mild thoracic spine discomfort when he leans against a chair.   Review of Systems No fever chills No cough He has a history of heartburn, "in a way feels like is heartburn"  no actual dysphagia or odynophagia No nausea, vomiting, diarrhea or change in the color of stools. No rash.  Past Medical History:  Diagnosis Date  . AAA (abdominal aortic aneurysm) without rupture ([place]) 09/08/2013  . Abdominal bloating 05/19/2016  . Anxiety state 05/18/2014  . Atrial fibrillation ([place])   . 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 Benign paroxysmal positional vertigo 04/06/2013  . CAD (coronary artery disease)    nonobstructive by cath 2008; nonobstructive by Cardiac CT 11/2011  . Cervical spine fracture ([facility]) 1980   C6-7  . Diverticulosis   . Erectile dysfunction 09/08/2013  . GERD (gastroesophageal reflux disease)   . Hiatal hernia   . History of atrial fibrillation   . History of squamous cell carcinoma 10-05-10   Dr [name], [place]  . HLD (hyperlipidemia)   . Hypothyroidism   . Medicare annual wellness visit, subsequent 03/16/2014   Sees [facility] Follows with Dr Johnsie Cancel of cardiology Sees [facility] for colonoscopy, last colonoscopy in 2013   . Mitral valve prolapse   . Neck pain 05/03/2015  . Preventative health care 03/16/2014  . 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 Syncope 02/03/2013  . 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    Past Surgical History:  Procedure Laterality Date  . APPENDECTOMY  05/2011  . CARDIAC CATHETERIZATION  01/03/12   30% ostial LAD stenosis, ostial 30-40% D1 stenosis, smooth and normal left main and RCA; LVEF 65%  . CHOLECYSTECTOMY N/A 08/25/2014   Procedure: LAPAROSCOPIC CHOLECYSTECTOMY WITH INTRAOPERATIVE CHOLANGIOGRAM;  Surgeon: [name], MD;  Location: [facility];  Service: General;  Laterality: N/A;  . INGUINAL HERNIA REPAIR  07/2010   right  . KNEE ARTHROPLASTY     bilat  . LEFT HEART CATHETERIZATION WITH CORONARY ANGIOGRAM N/A 01/03/2012   Procedure: LEFT HEART CATHETERIZATION WITH CORONARY ANGIOGRAM;  Surgeon: [name], MD;  Location: [facility] CATH LAB;  Service: Cardiovascular;  Laterality: N/A;  . TONSILLECTOMY  1947  . tumor removal  1958   "fatty tumor"    Social History   Social History  . Marital status: Single    Spouse name: N/A  . Number of children: N/A  . Years of education: N/A   Occupational History  . Chief Financial Officer     Retired   Social History Main Topics  . Smoking status: Never Smoker  . Smokeless tobacco: Never Used  . Alcohol use Yes     Comment: 2 glasses red wine/day  . Drug use: No  . Sexual activity: Yes   Other Topics Concern  . Not  on file   Social History Narrative  . No narrative on file      Allergies as of 08/24/2016      Reactions   Sulfa Antibiotics Other (See Comments)   Childhood allergy      Medication List       Accurate as of 08/24/16 11:59 PM. Always use your most recent med list.          aspirin EC 81 MG tablet Take 81 mg by mouth daily.   atorvastatin 20 MG tablet Commonly known as:  LIPITOR Take 1 tablet (20 mg total) by mouth daily at 6 PM.   CALCIUM 1000 + D PO Take 1 tablet by mouth daily.   Dutasteride-Tamsulosin HCl 0.5-0.4 MG Caps Commonly known as:  JALYN Take 1 capsule by mouth at bedtime.   levothyroxine  100 MCG tablet Commonly known as:  SYNTHROID, LEVOTHROID TAKE 1 TABLET (100 MCG TOTAL) BY MOUTH DAILY BEFORE BREAKFAST.   LORazepam 0.5 MG tablet Commonly known as:  ATIVAN Take 1 tablet (0.5 mg total) by mouth 2 (two) times daily as needed for anxiety.   meclizine 25 MG tablet Commonly known as:  ANTIVERT Take 1 tablet (25 mg total) by mouth 2 (two) times daily as needed for dizziness or nausea.   Milk Thistle 1000 MG Caps Take 1 capsule by mouth daily.   mirtazapine 15 MG tablet Commonly known as:  REMERON Take 1 tablet (15 mg total) by mouth at bedtime.   multivitamins ther. w/minerals Tabs tablet Take 1 tablet by mouth daily.   nitroGLYCERIN 0.4 MG SL tablet Commonly known as:  NITROSTAT Place 1 tablet (0.4 mg total) under the tongue every 5 (five) minutes x 3 doses as needed for chest pain.   pantoprazole 40 MG tablet Commonly known as:  PROTONIX Take 1 tablet (40 mg total) by mouth 2 (two) times daily before a meal.   PROBIOTIC DAILY PO Take by mouth.   sildenafil 20 MG tablet Commonly known as:  REVATIO TAKE 1 TO 5 TABLETS BY MOUTH EVERY DAY AS NEEDED   Testosterone 30 MG/ACT Soln 1 PUMP UNDER EACH ARM EVERY MORNING AS DIRECTED   zolpidem 10 MG tablet Commonly known as:  AMBIEN Take 0.5-1 tablets (5-10 mg total) by mouth at bedtime as needed for sleep.          Objective:   Physical Exam BP 124/78 (BP Location: Right Arm, Patient Position: Sitting, Cuff Size: Small)   Pulse (!) 48   Temp 97.6 F (36.4 C) (Oral)   Resp 14   Ht 5\' 10"  (1.778 m)   Wt 156 lb (70.8 kg)   SpO2 98%   BMI 22.38 kg/m  General:   Well developed, well nourished . NAD.  HEENT:  Normocephalic . Face symmetric, atraumatic. Neck: No LAD or mass Lungs:  CTA B Normal respiratory effort, no intercostal retractions, no accessory muscle use. Heart: RRR,  no murmur.  no pretibial edema bilaterally  Abdomen:  Not distended, soft, non-tender. No rebound or rigidity.  Skin: Not  pale. Not jaundice MSK: No TTP of the thoracic spine. Neurologic:  alert & oriented X3.  Speech normal, gait appropriate for age and unassisted Psych--  Cognition and judgment appear intact.  Cooperative with normal attention span and concentration.  Behavior appropriate. No anxious or depressed appearing.    Assessment & Plan:   76 year old male with h/o  atrial fibrillation (last ekg on sinus rhythm) , CAD, hypothyroidism, cervical fracture, GERD, on multiple  medications including testosterone presents with:  Chest pain: Chest pain is somewhat atypical for CAD, likely GI in origin. EKG today sinus bradycardia, no acute changes. He has a history of GERD, on daily PPIs, reports a remote EGD but no reports are found. Plan: Continue with PPIs twice a day (as he is doing for the last few days) x 6 weeks , GERD precautions, get a chest x-ray to check the mediastinum as he also has thoracic pain ; if not better in 2 weeks call for a GI referral particularly if he has dysphagia, odynophagia. If symptoms severe or atypical: go to the ER and let us known

## 2016-08-24 NOTE — Patient Instructions (Signed)
Get a  chest x-ray at the first floor  For the next 6 weeks: increase pantoprazole to twice a day, take it  before breakfast and before dinner on a empty stomach     Call or go to the ER if the pain is intense, different or you have other symptoms such as stomach pain, blood in the stools, difficulty breathing.    Food Choices for Gastroesophageal Reflux Disease, Adult When you have gastroesophageal reflux disease (GERD), the foods you eat and your eating habits are very important. Choosing the right foods can help ease the discomfort of GERD. Consider working with a diet and nutrition specialist (dietitian) to help you make healthy food choices. What general guidelines should I follow? Eating plan   Choose healthy foods low in fat, such as fruits, vegetables, whole grains, low-fat dairy products, and lean meat, fish, and poultry.  Eat frequent, small meals instead of three large meals each day. Eat your meals slowly, in a relaxed setting. Avoid bending over or lying down until 2-3 hours after eating.  Limit high-fat foods such as fatty meats or fried foods.  Limit your intake of oils, butter, and shortening to less than 8 teaspoons each day.  Avoid the following:  Foods that cause symptoms. These may be different for different people. Keep a food diary to keep track of foods that cause symptoms.  Alcohol.  Drinking large amounts of liquid with meals.  Eating meals during the 2-3 hours before bed.  Cook foods using methods other than frying. This may include baking, grilling, or broiling. Lifestyle    Maintain a healthy weight. Ask your health care provider what weight is healthy for you. If you need to lose weight, work with your health care provider to do so safely.  Exercise for at least 30 minutes on 5 or more days each week, or as told by your health care provider.  Avoid wearing clothes that fit tightly around your waist and chest.  Do not use any products that contain  nicotine or tobacco, such as cigarettes and e-cigarettes. If you need help quitting, ask your health care provider.  Sleep with the head of your bed raised. Use a wedge under the mattress or blocks under the bed frame to raise the head of the bed. What foods are not recommended? The items listed may not be a complete list. Talk with your dietitian about what dietary choices are best for you. Grains  Pastries or quick breads with added fat. [place] toast. Vegetables  Deep fried vegetables. [place] fries. Any vegetables prepared with added fat. Any vegetables that cause symptoms. For some people this may include tomatoes and tomato products, chili peppers, onions and garlic, and horseradish. Fruits  Any fruits prepared with added fat. Any fruits that cause symptoms. For some people this may include citrus fruits, such as oranges, grapefruit, pineapple, and lemons. Meats and other protein foods  High-fat meats, such as fatty beef or pork, hot dogs, ribs, ham, sausage, salami and bacon. Fried meat or protein, including fried fish and fried chicken. Nuts and nut butters. Dairy  Whole milk and chocolate milk. Sour cream. Cream. Ice cream. Cream cheese. Milk shakes. Beverages  Coffee and tea, with or without caffeine. Carbonated beverages. Sodas. Energy drinks. Fruit juice made with acidic fruits (such as orange or grapefruit). Tomato juice. Alcoholic drinks. Fats and oils  Butter. Margarine. Shortening. Ghee. Sweets and desserts  Chocolate and cocoa. Donuts. Seasoning and other foods  Pepper. Peppermint and spearmint.  Any condiments, herbs, or seasonings that cause symptoms. For some people, this may include curry, hot sauce, or vinegar-based salad dressings. Summary  When you have gastroesophageal reflux disease (GERD), food and lifestyle choices are very important to help ease the discomfort of GERD.  Eat frequent, small meals instead of three large meals each day. Eat your meals slowly, in a  relaxed setting. Avoid bending over or lying down until 2-3 hours after eating.  Limit high-fat foods such as fatty meat or fried foods. This information is not intended to replace advice given to you by your health care provider. Make sure you discuss any questions you have with your health care provider. Document Released: 06/06/2005 Document Revised: 06/07/2016 Document Reviewed: 06/07/2016 Elsevier Interactive Patient Education  2017 [employer].

## 2016-09-07 ENCOUNTER — Other Ambulatory Visit: Payer: Self-pay | Admitting: Family Medicine

## 2016-09-07 MED ORDER — DUTASTERIDE-TAMSULOSIN HCL 0.5-0.4 MG PO CAPS: 1.0000 | ORAL_CAPSULE | Freq: Every day | ORAL | 1 refills | Status: DC

## 2016-09-07 NOTE — Telephone Encounter (Signed)
Refill sent. Notified pt. 

## 2016-09-07 NOTE — Telephone Encounter (Signed)
Caller name: Relationship to patient: Self Can be reached: [phone]  Pharmacy:  [facility], [place] - [address] [phone] ([phone] (Fax)    Reason for call: Request refill on Dutasteride-Tamsulosin HCl (JALYN) 0.5-0.4 MG CAPS [[id number]]

## 2016-09-13 ENCOUNTER — Telehealth: Payer: Self-pay | Admitting: Family Medicine

## 2016-09-13 MED ORDER — TESTOSTERONE 30 MG/ACT TD SOLN: TRANSDERMAL | 0 refills | Status: DC

## 2016-09-13 NOTE — Telephone Encounter (Signed)
Last office visit 05/19/2016 Last lab on 08/02/2016 Last lab on 11/13/2015

## 2016-09-13 NOTE — Telephone Encounter (Signed)
Faxed to [facility] and informed to schedule appointment

## 2016-09-13 NOTE — Telephone Encounter (Signed)
Caller name: Relationship to patient: Self Can be reached: [phone]  Pharmacy:  [facility], [place] - [address] [phone] ([phone] (Fax)     Reason for call: Request refill on Testosterone 30 MG/ACT SOLN [[id number]

## 2016-09-13 NOTE — Telephone Encounter (Signed)
He can have a refill on the testosterone but he needs an appt for any further prescriptions

## 2016-09-14 ENCOUNTER — Telehealth: Payer: Self-pay

## 2016-09-14 NOTE — Telephone Encounter (Signed)
PA initiated via Covermymeds; KEY: [id number]. Received real-time response. PA approved through 09/14/2017. PA approval notification sent for scanning.

## 2016-10-10 DIAGNOSIS — R079 Chest pain, unspecified: Secondary | ICD-10-CM | POA: Diagnosis not present

## 2016-10-10 DIAGNOSIS — R1013 Epigastric pain: Secondary | ICD-10-CM | POA: Diagnosis not present

## 2016-10-10 DIAGNOSIS — K219 Gastro-esophageal reflux disease without esophagitis: Secondary | ICD-10-CM | POA: Diagnosis not present

## 2016-10-13 ENCOUNTER — Telehealth: Payer: Self-pay | Admitting: Family Medicine

## 2016-10-13 ENCOUNTER — Other Ambulatory Visit: Payer: Self-pay | Admitting: Family Medicine

## 2016-10-13 MED ORDER — LEVOTHYROXINE SODIUM 100 MCG PO TABS: ORAL_TABLET | ORAL | 0 refills | Status: DC

## 2016-10-13 NOTE — Telephone Encounter (Signed)
Last refill  09/13/16  #43ml with 0 refills----Testosterone topical  Last office visit 05/19/2016 03/11/14 contract

## 2016-10-13 NOTE — Telephone Encounter (Signed)
OK to refill Testosterone with same sig. Same quantity

## 2016-10-17 MED ORDER — TESTOSTERONE 30 MG/ACT TD SOLN: TRANSDERMAL | 0 refills | Status: DC

## 2016-10-17 NOTE — Telephone Encounter (Signed)
Faxed hardcopy for testosterone to [facility]

## 2016-10-24 DIAGNOSIS — M4722 Other spondylosis with radiculopathy, cervical region: Secondary | ICD-10-CM | POA: Diagnosis not present

## 2016-10-24 DIAGNOSIS — M5412 Radiculopathy, cervical region: Secondary | ICD-10-CM | POA: Diagnosis not present

## 2016-10-24 DIAGNOSIS — M47892 Other spondylosis, cervical region: Secondary | ICD-10-CM | POA: Diagnosis not present

## 2016-10-31 HISTORY — PX: MRI: SHX5353

## 2016-11-01 DIAGNOSIS — M542 Cervicalgia: Secondary | ICD-10-CM | POA: Diagnosis not present

## 2016-11-01 DIAGNOSIS — M4722 Other spondylosis with radiculopathy, cervical region: Secondary | ICD-10-CM | POA: Diagnosis not present

## 2016-11-07 DIAGNOSIS — R1013 Epigastric pain: Secondary | ICD-10-CM | POA: Diagnosis not present

## 2016-11-08 DIAGNOSIS — M47892 Other spondylosis, cervical region: Secondary | ICD-10-CM | POA: Diagnosis not present

## 2016-11-16 ENCOUNTER — Other Ambulatory Visit: Payer: Self-pay | Admitting: Family Medicine

## 2016-11-16 NOTE — Telephone Encounter (Signed)
Requesting:   zolpidem Contract    03/11/14 UDS    none Last OV   05/19/2016 Last Refill    #30 with 5 refills on 05/19/2016  Please Advise

## 2016-11-17 NOTE — Telephone Encounter (Signed)
Faxed hardcopy for Testosterone and zolpidem to [facility]

## 2016-11-18 IMAGING — DX DG CERVICAL SPINE COMPLETE 4+V
6 series · 6 of 6 positions shown · non-contrast
Comparison: None.

CLINICAL DATA: Neck pain and crepitus

EXAM:
CERVICAL SPINE - COMPLETE 4+ VIEW

[c-spine lat]
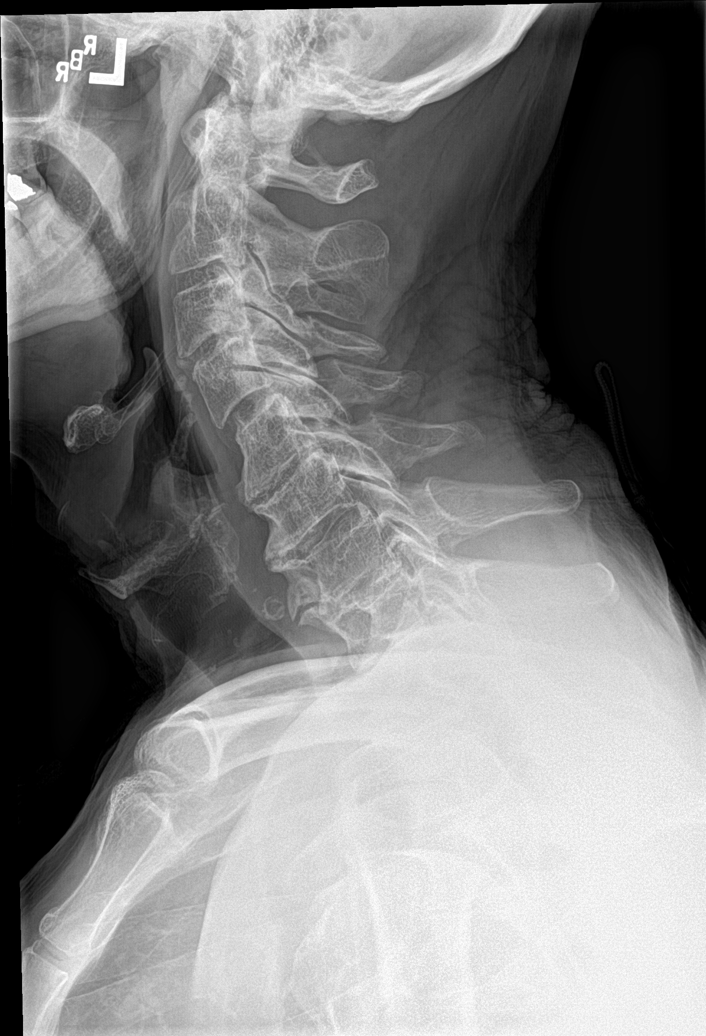

[c-spine obl (1 of 2)]
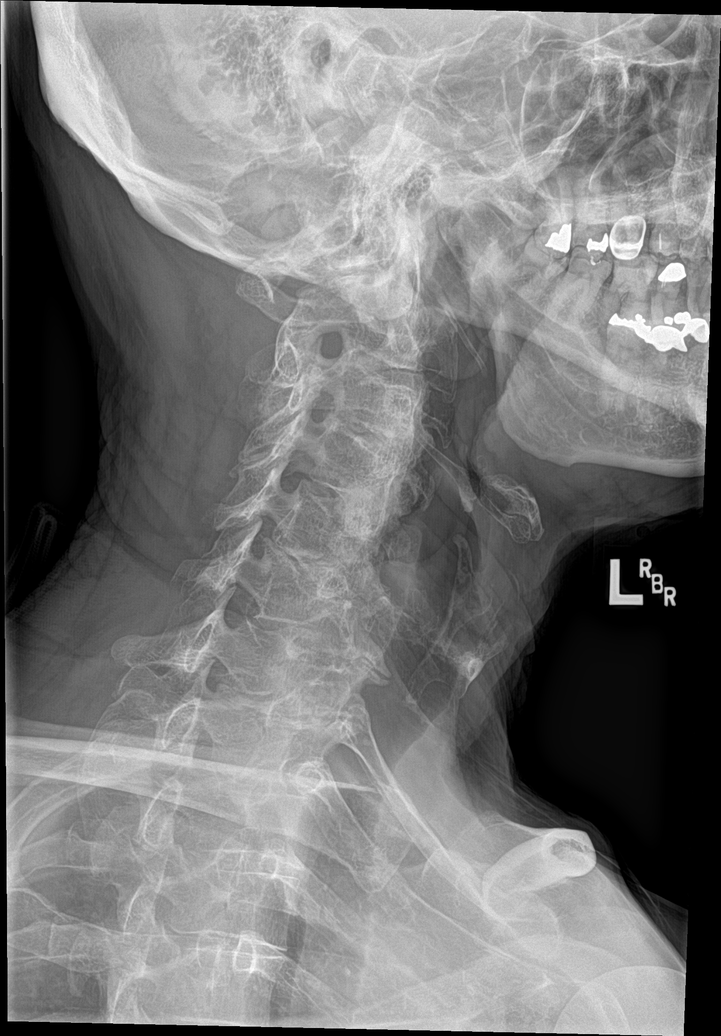

[c-spine obl (2 of 2)]
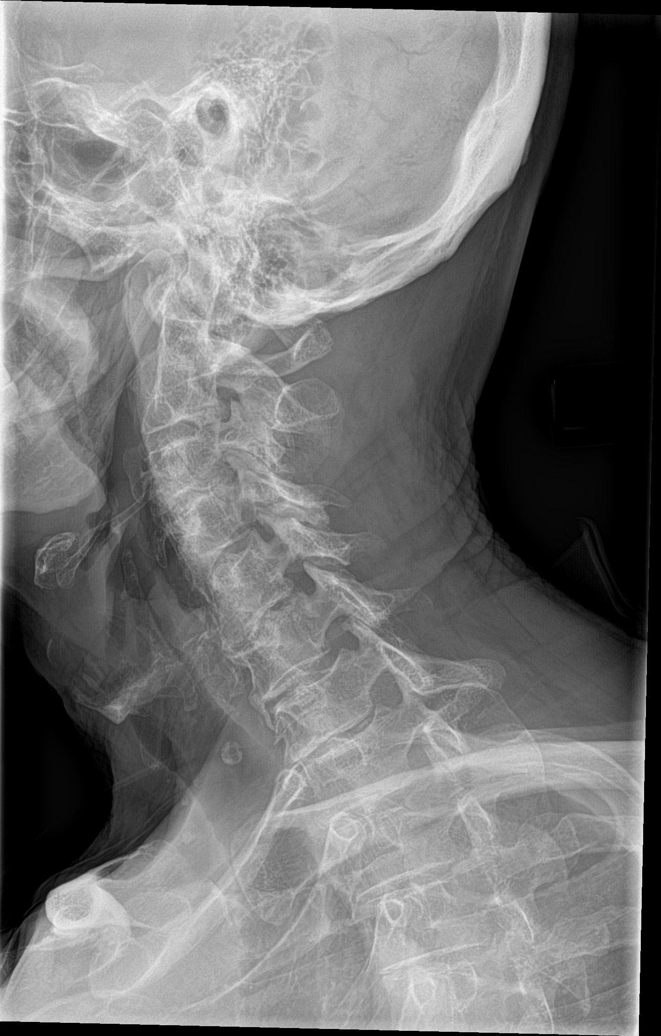

[c-spine ap]
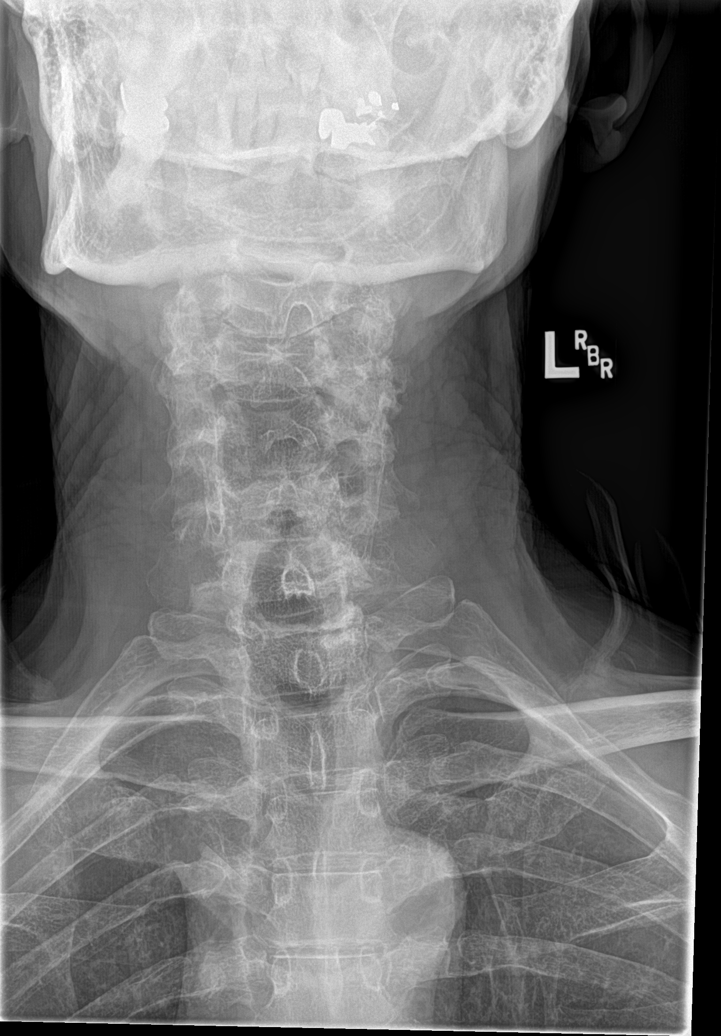

[[person_name]]
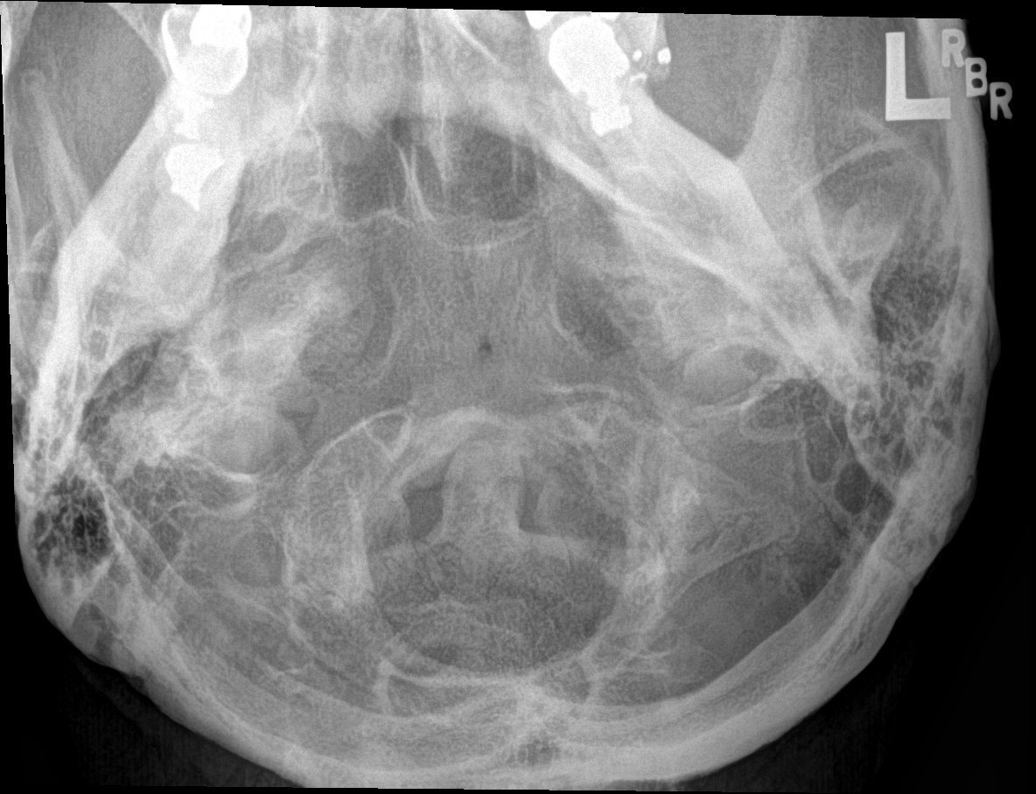

[c-spine open mouth]
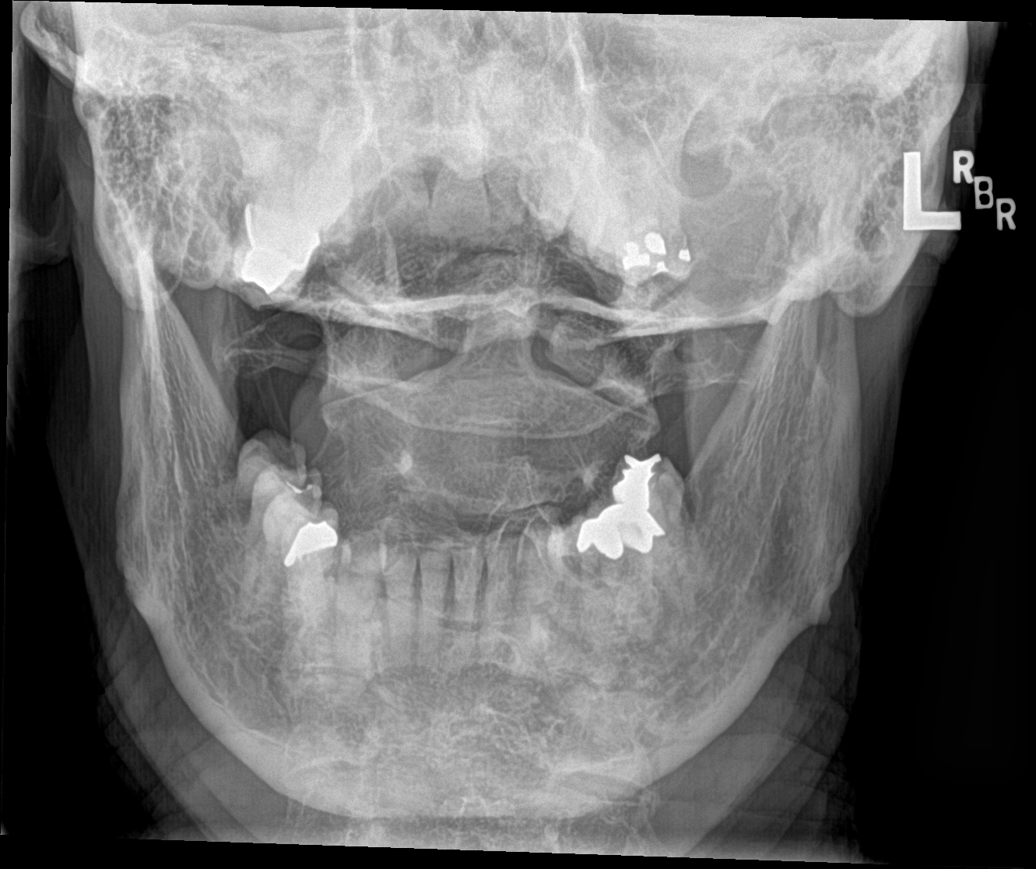

[6 of 6 positions shown; findings below may reference images not displayed]

FINDINGS: The cervical vertebrae are in normal alignment. There is
degenerative disc disease at C5-6, C6-7, and C7-T1 levels where
there is loss of disc space, sclerosis, and spurring. No
prevertebral soft tissue swelling is seen. On oblique views, there
is septae foraminal narrowing particularly C5-6 bilaterally. The
odontoid process is intact. The lung apices are not well seen.
IMPRESSION: Degenerative disc disease at C5-6, C6-7, and C7-T1 with some
foraminal narrowing particularly at C5-6

## 2016-11-24 ENCOUNTER — Encounter: Payer: Self-pay | Admitting: Family Medicine

## 2016-11-24 ENCOUNTER — Ambulatory Visit (INDEPENDENT_AMBULATORY_CARE_PROVIDER_SITE_OTHER): Payer: Medicare Other | Admitting: Family Medicine

## 2016-11-24 ENCOUNTER — Ambulatory Visit (HOSPITAL_BASED_OUTPATIENT_CLINIC_OR_DEPARTMENT_OTHER)
Admission: RE | Admit: 2016-11-24 | Discharge: 2016-11-24 | Disposition: A | Discharge status: Home / Self Care | Payer: Medicare Other | Source: Ambulatory Visit | Attending: Family Medicine | Admitting: Family Medicine

## 2016-11-24 VITALS — BP 117/67 | Temp 98.1°F | Wt 153.4 lb

## 2016-11-24 DIAGNOSIS — I2510 Atherosclerotic heart disease of native coronary artery without angina pectoris: Secondary | ICD-10-CM | POA: Diagnosis not present

## 2016-11-24 DIAGNOSIS — R140 Abdominal distension (gaseous): Secondary | ICD-10-CM | POA: Diagnosis not present

## 2016-11-24 DIAGNOSIS — M546 Pain in thoracic spine: Secondary | ICD-10-CM

## 2016-11-24 DIAGNOSIS — M549 Dorsalgia, unspecified: Secondary | ICD-10-CM

## 2016-11-24 DIAGNOSIS — I10 Essential (primary) hypertension: Secondary | ICD-10-CM

## 2016-11-24 DIAGNOSIS — E785 Hyperlipidemia, unspecified: Secondary | ICD-10-CM | POA: Diagnosis not present

## 2016-11-24 DIAGNOSIS — E039 Hypothyroidism, unspecified: Secondary | ICD-10-CM | POA: Diagnosis not present

## 2016-11-24 DIAGNOSIS — I2583 Coronary atherosclerosis due to lipid rich plaque: Secondary | ICD-10-CM

## 2016-11-24 MED ORDER — PANTOPRAZOLE SODIUM 40 MG PO TBEC: 40.0000 mg | DELAYED_RELEASE_TABLET | Freq: Two times a day (BID) | ORAL | 3 refills | Status: DC

## 2016-11-24 MED ORDER — LEVOTHYROXINE SODIUM 100 MCG PO TABS: ORAL_TABLET | ORAL | 0 refills | Status: DC

## 2016-11-24 MED ORDER — DUTASTERIDE-TAMSULOSIN HCL 0.5-0.4 MG PO CAPS: 1.0000 | ORAL_CAPSULE | Freq: Every day | ORAL | 1 refills | Status: DC

## 2016-11-24 MED ORDER — ZOLPIDEM TARTRATE 10 MG PO TABS: ORAL_TABLET | ORAL | 0 refills | Status: DC

## 2016-11-24 MED ORDER — ATORVASTATIN CALCIUM 20 MG PO TABS: 20.0000 mg | ORAL_TABLET | Freq: Every day | ORAL | 1 refills | Status: DC

## 2016-11-24 MED ORDER — TESTOSTERONE 30 MG/ACT TD SOLN: TRANSDERMAL | 0 refills | Status: DC

## 2016-11-24 MED ORDER — MIRTAZAPINE 15 MG PO TABS: 15.0000 mg | ORAL_TABLET | Freq: Every day | ORAL | 2 refills | Status: DC

## 2016-11-24 NOTE — Patient Instructions (Addendum)
benefiber powder twice daily with 64 oz of clear fluids  Lidocaine patches and gel by aspercreme, icy hot or salon pas Back Pain, Adult Back pain is very common in adults.The cause of back pain is rarely dangerous and the pain often gets better over time.The cause of your back pain may not be known. Some common causes of back pain include:  Strain of the muscles or ligaments supporting the spine.  Wear and tear (degeneration) of the spinal disks.  Arthritis.  Direct injury to the back.  For many people, back pain may return. Since back pain is rarely dangerous, most people can learn to manage this condition on their own. Follow these instructions at home: Watch your back pain for any changes. The following actions may help to lessen any discomfort you are feeling:  Remain active. It is stressful on your back to sit or stand in one place for long periods of time. Do not sit, drive, or stand in one place for more than 30 minutes at a time. Take short walks on even surfaces as soon as you are able.Try to increase the length of time you walk each day.  Exercise regularly as directed by your health care provider. Exercise helps your back heal faster. It also helps avoid future injury by keeping your muscles strong and flexible.  Do not stay in bed.Resting more than 1-2 days can delay your recovery.  Pay attention to your body when you bend and lift. The most comfortable positions are those that put less stress on your recovering back. Always use proper lifting techniques, including: ? Bending your knees. ? Keeping the load close to your body. ? Avoiding twisting.  Find a comfortable position to sleep. Use a firm mattress and lie on your side with your knees slightly bent. If you lie on your back, put a pillow under your knees.  Avoid feeling anxious or stressed.Stress increases muscle tension and can worsen back pain.It is important to recognize when you are anxious or stressed and  learn ways to manage it, such as with exercise.  Take medicines only as directed by your health care provider. Over-the-counter medicines to reduce pain and inflammation are often the most helpful.Your health care provider may prescribe muscle relaxant drugs.These medicines help dull your pain so you can more quickly return to your normal activities and healthy exercise.  Apply ice to the injured area: ? Put ice in a plastic bag. ? Place a towel between your skin and the bag. ? Leave the ice on for 20 minutes, 2-3 times a day for the first 2-3 days. After that, ice and heat may be alternated to reduce pain and spasms.  Maintain a healthy weight. Excess weight puts extra stress on your back and makes it difficult to maintain good posture.  Contact a health care provider if:  You have pain that is not relieved with rest or medicine.  You have increasing pain going down into the legs or buttocks.  You have pain that does not improve in one week.  You have night pain.  You lose weight.  You have a fever or chills. Get help right away if:  You develop new bowel or bladder control problems.  You have unusual weakness or numbness in your arms or legs.  You develop nausea or vomiting.  You develop abdominal pain.  You feel faint. This information is not intended to replace advice given to you by your health care provider. Make sure you discuss any  questions you have with your health care provider. Document Released: 06/06/2005 Document Revised: 10/15/2015 Document Reviewed: 10/08/2013 Elsevier Interactive Patient Education  2017 [employer].

## 2016-11-24 NOTE — Progress Notes (Signed)
Pre visit review using our clinic review tool, if applicable. No additional management support is needed unless otherwise documented below in the visit note. 

## 2016-11-24 NOTE — Progress Notes (Signed)
Patient ID: James [name], male   DOB: 1941-04-14, 76 y.o.   MRN: [id number]   Subjective:  I acted as a Education administrator for James [name], [place], [place]   Patient ID: James [name], male    DOB: 1940-11-22, 76 y.o.   MRN: [id number]  Chief Complaint  Patient presents with  . Follow-up    F/U from [facility].   Marland Kitchen Bloated    Accompanied by periods of burping; which temporarily relieves the bloating and gas.   . Back Pain  . Pain in the esophaus    Sometimes at the center of his chest.   . Gastroesophageal Reflux    Unsetling feeling; and sometimes with a noisy feeling.    Back Pain  This is a chronic problem. The problem occurs daily. The problem is unchanged. The pain is present in the lumbar spine. The quality of the pain is described as aching. The pain is at a severity of 3/10. The pain is mild. Stiffness is present all day. Pertinent negatives include no chest pain, fever or headaches.  Gastroesophageal Reflux  He complains of heartburn. He reports no chest pain or no coughing. This is a chronic problem. The problem occurs frequently. The problem has been waxing and waning. The heartburn is of moderate intensity. The heartburn limits (Sometimes.) his activity. The heartburn changes with position. The symptoms are aggravated by certain foods and stress. The treatment provided mild relief.    Patient is in today for follow up from his Annual Wellness Visit. Patient has had the following concerns: pain in the middle of back, into shoulder blades. Soreness in the upper spine/ lower cervical area. Pain/raw feeling in the esophagus area (sometimes center of chest). Unsettling, noisy feeling sometimes in his stomach. And also, feelings of having gas and being bloated, followed by periods of burping which temporarily relieves the burping. Patient also had an MRI done on 10/31/2016 (Dr. Ashok Pall of [facility] in [place], [place]). Patient has a Hx of CAD, mitral valve prolapse,  GERD, hypothyroidism, BCC, ED, BPH, hyperlipidemia, insomnia, anxiety state, neck pain, back pain. Patient has no additional acute concerns noted at this time. No recent febrile illness or hospitalizations. He is doing well at home with ADLs. Denies CP/palp/SOB/HA/congestion/fevers/GI or GU c/o. Taking meds as prescribed  Patient Care Team: [name], MD as PCP - General (Family Medicine) Hollar, Katharine Look, MD as Referring Physician (Dermatology) Nahser, Wonda Cheng, MD as Consulting Physician (Cardiology) Carol Ada, MD as Consulting Physician (Gastroenterology)   Past Medical History:  Diagnosis Date  . AAA (abdominal aortic aneurysm) without rupture ([place]) 09/08/2013  . Abdominal bloating 05/19/2016  . Anxiety state 05/18/2014  . Atrial fibrillation ([place])   . 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 Benign paroxysmal positional vertigo 04/06/2013  . CAD (coronary artery disease)    nonobstructive by cath 2008; nonobstructive by Cardiac CT 11/2011  . Cervical spine fracture ([facility]) 1980   C6-7  . Diverticulosis   . Erectile dysfunction 09/08/2013  . GERD (gastroesophageal reflux disease)   . Hiatal hernia   . History of atrial fibrillation   . History of squamous cell carcinoma 10-05-10   Dr [name], [place]  . HLD (hyperlipidemia)   . Hypothyroidism   . Medicare annual wellness visit, subsequent 03/16/2014   Sees [facility] [facility] ortho Follows with Dr Johnsie Cancel of cardiology Sees [facility] for colonoscopy, last colonoscopy in 2013   . Mitral valve prolapse   .  Neck pain 05/03/2015  . Preventative health care 03/16/2014  . 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 Syncope 02/03/2013  . 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    Past Surgical History:  Procedure Laterality Date  . APPENDECTOMY  05/2011  . CARDIAC CATHETERIZATION  01/03/12   30% ostial LAD stenosis, ostial 30-40% D1 stenosis, smooth and normal left main and RCA; LVEF 65%  .  CHOLECYSTECTOMY N/A 08/25/2014   Procedure: LAPAROSCOPIC CHOLECYSTECTOMY WITH INTRAOPERATIVE CHOLANGIOGRAM;  Surgeon: [name], MD;  Location: [facility];  Service: General;  Laterality: N/A;  . INGUINAL HERNIA REPAIR  07/2010   right  . KNEE ARTHROPLASTY     bilat  . LEFT HEART CATHETERIZATION WITH CORONARY ANGIOGRAM N/A 01/03/2012   Procedure: LEFT HEART CATHETERIZATION WITH CORONARY ANGIOGRAM;  Surgeon: [name], MD;  Location: [facility] CATH LAB;  Service: Cardiovascular;  Laterality: N/A;  . MRI  10/31/2016  . TONSILLECTOMY  1947  . tumor removal  1958   "fatty tumor"    Family History  Problem Relation Age of Onset  . Heart disease Mother        MI 28s  . Stroke Mother   . Hypertension Mother   . Heart disease Maternal Grandfather        MI 70-80s  . Heart disease Maternal Grandmother        MI 70-80s  . Lung disease Father        penumonitis  . Cancer Sister        colon  . Cancer Paternal Uncle        GI: colon he thinks    Social History   Social History  . Marital status: Single    Spouse name: N/A  . Number of children: N/A  . Years of education: N/A   Occupational History  . Chief Financial Officer     Retired   Social History Main Topics  . Smoking status: Never Smoker  . Smokeless tobacco: Never Used  . Alcohol use Yes     Comment: 2 glasses red wine/day  . Drug use: No  . Sexual activity: Yes   Other Topics Concern  . Not on file   Social History Narrative  . No narrative on file    Outpatient Medications Prior to Visit  Medication Sig Dispense Refill  . aspirin EC 81 MG tablet Take 81 mg by mouth daily.    . Calcium Carb-Cholecalciferol (CALCIUM 1000 + D PO) Take 1 tablet by mouth daily.    Marland Kitchen LORazepam (ATIVAN) 0.5 MG tablet Take 1 tablet (0.5 mg total) by mouth 2 (two) times daily as needed for anxiety. 30 tablet 0  . meclizine (ANTIVERT) 25 MG tablet Take 1 tablet (25 mg total) by mouth 2 (two) times daily as needed for dizziness or nausea. 30 tablet 2   . Milk Thistle 1000 MG CAPS Take 1 capsule by mouth daily.    . Multiple Vitamins-Minerals (MULTIVITAMINS THER. W/MINERALS) TABS Take 1 tablet by mouth daily.    . Probiotic Product (PROBIOTIC DAILY PO) Take by mouth.    . sildenafil (REVATIO) 20 MG tablet TAKE 1 TO 5 TABLETS BY MOUTH DAILY AS NEEDED 35 tablet 0  . atorvastatin (LIPITOR) 20 MG tablet Take 1 tablet (20 mg total) by mouth daily at 6 PM. 90 tablet 1  . Dutasteride-Tamsulosin HCl (JALYN) 0.5-0.4 MG CAPS Take 1 capsule by mouth at bedtime. 90 capsule 1  . levothyroxine (SYNTHROID, LEVOTHROID) 100 MCG tablet TAKE 1  TABLET (100 MCG TOTAL) BY MOUTH DAILY BEFORE BREAKFAST. 30 tablet 0  . mirtazapine (REMERON) 15 MG tablet Take 1 tablet (15 mg total) by mouth at bedtime. 90 tablet 2  . pantoprazole (PROTONIX) 40 MG tablet Take 1 tablet (40 mg total) by mouth 2 (two) times daily before a meal. 60 tablet 3  . Testosterone 30 MG/ACT SOLN APPLY 1 PUMP UNDER EACH ARM EVERY MORNING AS DIRECTED 90 mL 0  . zolpidem (AMBIEN) 10 MG tablet TAKE 1/2 TO 1 TABLET BY MOUTH EVERY NIGHT AT BEDTIME AS NEEDED FOR SLEEP 30 tablet 0  . nitroGLYCERIN (NITROSTAT) 0.4 MG SL tablet Place 1 tablet (0.4 mg total) under the tongue every 5 (five) minutes x 3 doses as needed for chest pain. (Patient not taking: Reported on 08/24/2016) 25 tablet 3   No facility-administered medications prior to visit.     Allergies  Allergen Reactions  . Sulfa Antibiotics Other (See Comments)    Childhood allergy    Review of Systems  Constitutional: Negative for fever and malaise/fatigue.  HENT: Negative for congestion.   Eyes: Negative for blurred vision.  Respiratory: Negative for cough and shortness of breath.   Cardiovascular: Negative for chest pain, palpitations and leg swelling.  Gastrointestinal: Positive for heartburn. Negative for vomiting.       Unsettling, noisy feeling in the stomach. Bloating ang gas. Burping.  Musculoskeletal: Positive for back pain and joint  pain.       Middle of back, into shoulder blades. Soreness in the upper spine/ lower cervical area. Pain/raw feeling in the esophagus area (sometimes center of chest). Unsettling, noisy feeling sometimes in his stomach.  Skin: Negative for rash.  Neurological: Negative for loss of consciousness and headaches.       Objective:    Physical Exam  Constitutional: He is oriented to person, place, and time. He appears well-developed and well-nourished. No distress.  HENT:  Head: Normocephalic and atraumatic.  Eyes: Conjunctivae are normal.  Neck: Normal range of motion. No thyromegaly present.  Cardiovascular: Normal rate and regular rhythm.   Pulmonary/Chest: Effort normal and breath sounds normal. He has no wheezes.  Abdominal: Soft. Bowel sounds are normal. There is no tenderness.  Musculoskeletal: He exhibits no edema or deformity.  Neurological: He is alert and oriented to person, place, and time.  Skin: Skin is warm and dry. He is not diaphoretic.  Psychiatric: He has a normal mood and affect.    BP 117/67 (BP Location: Left Arm, Patient Position: Sitting, Cuff Size: Normal)   Temp 98.1 F (36.7 C) (Oral)   Wt 153 lb 6.4 oz (69.6 kg)   SpO2 99% Comment: RA  BMI 22.01 kg/m  Wt Readings from Last 3 Encounters:  11/24/16 153 lb 6.4 oz (69.6 kg)  08/24/16 156 lb (70.8 kg)  05/19/16 158 lb (71.7 kg)   BP Readings from Last 3 Encounters:  11/24/16 117/67  08/24/16 124/78  05/19/16 116/76     Immunization History  Administered Date(s) Administered  . Influenza Whole 03/20/2012  . Influenza, High Dose Seasonal PF 04/02/2013, 04/23/2015  . Influenza,inj,Quad PF,36+ Mos 03/11/2014  . Influenza-Unspecified 03/03/2016  . Pneumococcal Conjugate-13 03/11/2014  . Pneumococcal Polysaccharide-23 04/18/2009  . Tdap 08/05/2009  . Zoster 09/10/2008    Health Maintenance  Topic Date Due  . INFLUENZA VACCINE  01/18/2017  . TETANUS/TDAP  08/06/2019  . PNA vac Low Risk Adult   Completed    Lab Results  Component Value Date   WBC 8.2 11/24/2016  HGB 13.9 11/24/2016   HCT 40.4 11/24/2016   PLT 207.0 11/24/2016   GLUCOSE 75 11/24/2016   CHOL 140 11/24/2016   TRIG 84.0 11/24/2016   HDL 67.60 11/24/2016   LDLCALC 56 11/24/2016   ALT 20 11/24/2016   AST 20 11/24/2016   NA 137 11/24/2016   K 4.1 11/24/2016   CL 102 11/24/2016   CREATININE 0.82 11/24/2016   BUN 13 11/24/2016   CO2 29 11/24/2016   TSH 0.87 11/24/2016   PSA 0.51 04/23/2015   INR 1.09 08/21/2014    Lab Results  Component Value Date   TSH 0.87 11/24/2016   Lab Results  Component Value Date   WBC 8.2 11/24/2016   HGB 13.9 11/24/2016   HCT 40.4 11/24/2016   MCV 94.4 11/24/2016   PLT 207.0 11/24/2016   Lab Results  Component Value Date   NA 137 11/24/2016   K 4.1 11/24/2016   CO2 29 11/24/2016   GLUCOSE 75 11/24/2016   BUN 13 11/24/2016   CREATININE 0.82 11/24/2016   BILITOT 0.7 11/24/2016   ALKPHOS 56 11/24/2016   AST 20 11/24/2016   ALT 20 11/24/2016   PROT 6.9 11/24/2016   ALBUMIN 3.9 11/24/2016   CALCIUM 9.3 11/24/2016   ANIONGAP 6 08/21/2014   GFR 96.84 11/24/2016   Lab Results  Component Value Date   CHOL 140 11/24/2016   Lab Results  Component Value Date   HDL 67.60 11/24/2016   Lab Results  Component Value Date   LDLCALC 56 11/24/2016   Lab Results  Component Value Date   TRIG 84.0 11/24/2016   Lab Results  Component Value Date   CHOLHDL 2 11/24/2016   No results found for: HGBA1C       Assessment & Plan:   Problem List Items Addressed This Visit    Hyperlipidemia    Encouraged heart healthy diet, increase exercise, avoid trans fats, consider a krill oil cap daily      Relevant Medications   atorvastatin (LIPITOR) 20 MG tablet   Other Relevant Orders   Lipid panel (Completed)   Hypothyroidism    On Levothyroxine, continue to monitor      Relevant Medications   levothyroxine (SYNTHROID, LEVOTHROID) 100 MCG tablet   Abdominal  bloating    Avoid offending foods, start probiotics. Do not eat large meals in late evening and consider raising head of bed. Add a fiber supplement bid      Bilateral thoracic back pain - Primary    Encouraged moist heat and gentle stretching as tolerated. May try NSAIDs and prescription meds as directed and report if symptoms worsen or seek immediate care. Try topical treatments.       Essential hypertension    Well controlled, no changes to meds. Encouraged heart healthy diet such as the DASH diet and exercise as tolerated.       Relevant Medications   atorvastatin (LIPITOR) 20 MG tablet   Other Relevant Orders   CBC (Completed)   Comprehensive metabolic panel (Completed)   TSH (Completed)    Other Visit Diagnoses    Back pain, unspecified back location, unspecified back pain laterality, unspecified chronicity       Relevant Orders   DG Thoracic Spine 2 View (Completed)      I am having Mr. Totherow maintain his multivitamins ther. w/minerals, Calcium Carb-Cholecalciferol (CALCIUM 1000 + D PO), meclizine, nitroGLYCERIN, Probiotic Product (PROBIOTIC DAILY PO), aspirin EC, LORazepam, Milk Thistle, sildenafil, atorvastatin, levothyroxine, zolpidem, pantoprazole, mirtazapine, Dutasteride-Tamsulosin HCl,  and Testosterone.  Meds ordered this encounter  Medications  . atorvastatin (LIPITOR) 20 MG tablet    Sig: Take 1 tablet (20 mg total) by mouth daily at 6 PM.    Dispense:  90 tablet    Refill:  1  . levothyroxine (SYNTHROID, LEVOTHROID) 100 MCG tablet    Sig: TAKE 1 TABLET (100 MCG TOTAL) BY MOUTH DAILY BEFORE BREAKFAST.    Dispense:  30 tablet    Refill:  0  . zolpidem (AMBIEN) 10 MG tablet    Sig: TAKE 1/2 TO 1 TABLET BY MOUTH EVERY NIGHT AT BEDTIME AS NEEDED FOR SLEEP    Dispense:  30 tablet    Refill:  0  . pantoprazole (PROTONIX) 40 MG tablet    Sig: Take 1 tablet (40 mg total) by mouth 2 (two) times daily before a meal.    Dispense:  60 tablet    Refill:  3  .  mirtazapine (REMERON) 15 MG tablet    Sig: Take 1 tablet (15 mg total) by mouth at bedtime.    Dispense:  90 tablet    Refill:  2  . Dutasteride-Tamsulosin HCl (JALYN) 0.5-0.4 MG CAPS    Sig: Take 1 capsule by mouth at bedtime.    Dispense:  90 capsule    Refill:  1  . Testosterone 30 MG/ACT SOLN    Sig: APPLY 1 PUMP UNDER EACH ARM EVERY MORNING AS DIRECTED    Dispense:  90 mL    Refill:  0    CMA served as scribe during this visit. History, Physical and Plan performed by medical provider. Documentation and orders reviewed and attested to.  James Homans, MD

## 2016-11-25 LAB — COMPREHENSIVE METABOLIC PANEL
ALT: 20 U/L (ref 0–53)
AST: 20 U/L (ref 0–37)
Albumin: 3.9 g/dL (ref 3.5–5.2)
Alkaline Phosphatase: 56 U/L (ref 39–117)
BUN: 13 mg/dL (ref 6–23)
CALCIUM: 9.3 mg/dL (ref 8.4–10.5)
CO2: 29 meq/L (ref 19–32)
CREATININE: 0.82 mg/dL (ref 0.40–1.50)
Chloride: 102 mEq/L (ref 96–112)
GFR: 96.84 mL/min (ref >60.00)
Glucose, Bld: 75 mg/dL (ref 70–99)
Potassium: 4.1 mEq/L (ref 3.5–5.1)
Sodium: 137 mEq/L (ref 135–145)
Total Bilirubin: 0.7 mg/dL (ref 0.2–1.2)
Total Protein: 6.9 g/dL (ref 6.0–8.3)

## 2016-11-25 LAB — CBC
HCT: 40.4 % (ref 39.0–52.0)
Hemoglobin: 13.9 g/dL (ref 13.0–17.0)
MCHC: 34.4 g/dL (ref 30.0–36.0)
MCV: 94.4 fl (ref 78.0–100.0)
PLATELETS: 207 10*3/uL (ref 150.0–400.0)
RBC: 4.28 Mil/uL (ref 4.22–5.81)
RDW: 13.4 % (ref 11.5–15.5)
WBC: 8.2 10*3/uL (ref 4.0–10.5)

## 2016-11-25 LAB — LIPID PANEL
CHOL/HDL RATIO: 2
Cholesterol: 140 mg/dL (ref 0–200)
HDL: 67.6 mg/dL (ref >39.00)
LDL Cholesterol: 56 mg/dL (ref 0–99)
NONHDL: 72.83
TRIGLYCERIDES: 84 mg/dL (ref 0.0–149.0)
VLDL: 16.8 mg/dL (ref 0.0–40.0)

## 2016-11-25 LAB — TSH: TSH: 0.87 u[IU]/mL (ref 0.35–4.50)

## 2016-11-27 DIAGNOSIS — I10 Essential (primary) hypertension: Secondary | ICD-10-CM | POA: Insufficient documentation

## 2016-11-27 DIAGNOSIS — M546 Pain in thoracic spine: Secondary | ICD-10-CM | POA: Insufficient documentation

## 2016-11-27 NOTE — Assessment & Plan Note (Signed)
Encouraged moist heat and gentle stretching as tolerated. May try NSAIDs and prescription meds as directed and report if symptoms worsen or seek immediate care. Try topical treatments 

## 2016-11-27 NOTE — Assessment & Plan Note (Signed)
Avoid offending foods, start probiotics. Do not eat large meals in late evening and consider raising head of bed. Add a fiber supplement bid

## 2016-11-27 NOTE — Assessment & Plan Note (Signed)
Encouraged heart healthy diet, increase exercise, avoid trans fats, consider a krill oil cap daily 

## 2016-11-27 NOTE — Assessment & Plan Note (Signed)
Well controlled, no changes to meds. Encouraged heart healthy diet such as the DASH diet and exercise as tolerated.  °

## 2016-11-27 NOTE — Assessment & Plan Note (Signed)
On Levothyroxine, continue to monitor 

## 2016-12-15 ENCOUNTER — Other Ambulatory Visit: Payer: Self-pay | Admitting: Family Medicine

## 2017-01-03 DIAGNOSIS — S139XXA Sprain of joints and ligaments of unspecified parts of neck, initial encounter: Secondary | ICD-10-CM | POA: Diagnosis not present

## 2017-01-14 ENCOUNTER — Telehealth: Payer: Self-pay | Admitting: Family Medicine

## 2017-01-14 ENCOUNTER — Encounter: Payer: Self-pay | Admitting: Family Medicine

## 2017-01-14 DIAGNOSIS — Z79899 Other long term (current) drug therapy: Secondary | ICD-10-CM | POA: Diagnosis not present

## 2017-01-16 NOTE — Telephone Encounter (Signed)
He needs a UDS and then he can have a 30 day supply with 5 rf

## 2017-01-16 NOTE — Telephone Encounter (Signed)
Requesting:   zolpidem Contract   11/24/2016 UDS     none Last OV    11/24/2016 Last Refill   #30 on 11/24/2016  Please Advise

## 2017-01-17 NOTE — Telephone Encounter (Signed)
Patient returning your call, call back [phone]

## 2017-01-17 NOTE — Telephone Encounter (Signed)
Called left message to call back. Printed prescription/PCP signed/attached note to do UDS Called the patient left message to call back in regards to this refill/instructions for UDS before getting meds.

## 2017-01-17 NOTE — Telephone Encounter (Signed)
Patient has now been informed hardcopy needs to be picked up at the front desk and needs to do UDS The patient did verbalize understanding and agreement to all instructions. Hardcopy put at the front

## 2017-01-20 ENCOUNTER — Other Ambulatory Visit: Payer: Self-pay | Admitting: Family Medicine

## 2017-01-20 NOTE — Telephone Encounter (Signed)
Faxed testosterone to [facility].

## 2017-01-20 NOTE — Telephone Encounter (Signed)
Faxed testosterone to [facility]

## 2017-01-20 NOTE — Telephone Encounter (Signed)
Requesting:   testosterone Contract   11/24/2016 UDS      Given--no results Last OV     11/24/2016 Last Refill   32ml  On 11/24/2016  Please Advise

## 2017-02-08 DIAGNOSIS — Z85828 Personal history of other malignant neoplasm of skin: Secondary | ICD-10-CM | POA: Diagnosis not present

## 2017-02-08 DIAGNOSIS — Z08 Encounter for follow-up examination after completed treatment for malignant neoplasm: Secondary | ICD-10-CM | POA: Diagnosis not present

## 2017-02-20 ENCOUNTER — Other Ambulatory Visit: Payer: Self-pay | Admitting: Family Medicine

## 2017-03-07 ENCOUNTER — Other Ambulatory Visit: Payer: Self-pay | Admitting: Family Medicine

## 2017-03-07 NOTE — Telephone Encounter (Signed)
Sent req to PCP/thx dmf 

## 2017-03-07 NOTE — Telephone Encounter (Signed)
Requesting: Testosterone Contract: Yes UDS: 7.28.18 low-risk next screen 1.28.19 Last OV: 6.7.18 Next OV: 12.10.18 Last Refill: 8.03.18   Please advise

## 2017-03-15 ENCOUNTER — Telehealth: Payer: Self-pay | Admitting: Family Medicine

## 2017-03-15 DIAGNOSIS — R42 Dizziness and giddiness: Secondary | ICD-10-CM

## 2017-03-15 NOTE — Telephone Encounter (Signed)
Pt inner ear problems dizziness nausea feeling yucky no fever no chills no other symptoms. Pt states last time this happened [place] prescribed Anivert and it took care of the problem. Pt prefers not to come for appt if possible. Pt req Anivert called in to [facility]. Pt ph [phone]. Pt states will come in today if we need to see him. He wants to feel better today with script or appt whichever is necessary.

## 2017-03-16 NOTE — Telephone Encounter (Signed)
Am willing to send in some Meclizine/Antivert as long he does not have any new neurologic complaints. He should rest and hdyrate and if worsens be seen. Meclizine 25 mg tabs, 1 tab po tid prn vertigo. Disp #40 with 1 rf

## 2017-03-16 NOTE — Telephone Encounter (Signed)
Please advise 

## 2017-03-17 MED ORDER — MECLIZINE HCL 25 MG PO TABS: 25.0000 mg | ORAL_TABLET | Freq: Two times a day (BID) | ORAL | 2 refills | Status: DC | PRN

## 2017-03-17 NOTE — Telephone Encounter (Signed)
meds sent to pharmacy.  Patient not c/o neurological sx  PC

## 2017-03-20 ENCOUNTER — Ambulatory Visit (INDEPENDENT_AMBULATORY_CARE_PROVIDER_SITE_OTHER): Payer: Medicare Other | Admitting: Medical

## 2017-03-20 ENCOUNTER — Other Ambulatory Visit: Payer: Self-pay | Admitting: Family Medicine

## 2017-03-20 ENCOUNTER — Encounter: Payer: Self-pay | Admitting: Medical

## 2017-03-20 ENCOUNTER — Telehealth: Payer: Self-pay | Admitting: Family Medicine

## 2017-03-20 VITALS — BP 100/68 | HR 60 | Temp 98.0°F | Ht 70.0 in | Wt 154.0 lb

## 2017-03-20 DIAGNOSIS — I2583 Coronary atherosclerosis due to lipid rich plaque: Secondary | ICD-10-CM

## 2017-03-20 DIAGNOSIS — R42 Dizziness and giddiness: Secondary | ICD-10-CM

## 2017-03-20 DIAGNOSIS — R109 Unspecified abdominal pain: Secondary | ICD-10-CM | POA: Diagnosis not present

## 2017-03-20 DIAGNOSIS — F419 Anxiety disorder, unspecified: Secondary | ICD-10-CM

## 2017-03-20 DIAGNOSIS — K219 Gastro-esophageal reflux disease without esophagitis: Secondary | ICD-10-CM

## 2017-03-20 DIAGNOSIS — I2510 Atherosclerotic heart disease of native coronary artery without angina pectoris: Secondary | ICD-10-CM

## 2017-03-20 DIAGNOSIS — R110 Nausea: Secondary | ICD-10-CM

## 2017-03-20 MED ORDER — LORAZEPAM 0.5 MG PO TABS: 0.5000 mg | ORAL_TABLET | Freq: Two times a day (BID) | ORAL | 0 refills | Status: DC | PRN

## 2017-03-20 MED ORDER — MECLIZINE HCL 12.5 MG PO TABS: 12.5000 mg | ORAL_TABLET | Freq: Three times a day (TID) | ORAL | 0 refills | Status: DC | PRN

## 2017-03-20 NOTE — Telephone Encounter (Signed)
[facility] - Client [place] [facility] Patient Name: James [name] DOB: [birth year] Initial Comment Caller states wants to be seen today; having malaise, dizziness, nausea; general weakness; thinks related to inner ear issues from few years ago; Nurse Assessment Nurse: Markus Daft, RN, Sherre Poot Date/Time (Eastern Time): 03/20/2017 10:29:03 AM Confirm and document reason for call. If symptomatic, describe symptoms. ---Caller states c/o having malaise, dizziness - like he is "in a fog" with a mild headache, nausea, and general weakness. Leaning to one side the first day, and not as much since then. Ears popped once. S/S started 4-5 days. Denies ear pain. - He thinks related to inner ear issues as he did previously from few years ago. Does the patient have any new or worsening symptoms? ---Yes Will a triage be completed? ---Yes Related visit to physician within the last 2 weeks? ---No Does the PT have any chronic conditions? (i.e. diabetes, asthma, etc.) ---Yes List chronic conditions. ---Hypothyroid Is this a behavioral health or substance abuse call? ---No Guidelines Guideline Title Affirmed Question Affirmed Notes Dizziness - Lightheadedness [1] MODERATE dizziness (e.g., interferes with normal activities) AND [2] has NOT been evaluated by physician for this (Exception: dizziness caused by heat exposure, sudden standing, or poor fluid intake) Final Disposition User See Physician within [place], [place], [place] Comments No appt with Dr. Charlett Blake available for today or tomorrow. Appt made for today with Mackie Pai PA at 1:15 pm Referrals REFERRED TO PCP OFFICE Caller Disagree/Comply Comply Caller Understands Yes PreDisposition Go to Urgent Care/Walk-In Clinic

## 2017-03-20 NOTE — Telephone Encounter (Signed)
Pt called in to schedule an apt. He said that he is experiencing some dizziness, nausea and weakness. Transferred pt's call to [facility] for triaging.

## 2017-03-20 NOTE — Patient Instructions (Signed)
For your dizziness, I am prescribing meclizine. You do have some associated symptoms with the dizziness that are very mild in intensity. So if your dizziness does not improve significantly by Friday I plan to order CT of head without contrast. If your dizziness and associated symptoms worsen before then please let me know and I would go ahead and place order before Friday. If any severe dizziness with neurologic signs and symptoms as discussed then ED evaluation strongly recommended.  For abdomen/GERD type symptoms continue your protonix and advised that she can get ranitidine over-the-counter. I will go ahead and place referral to GI M.D. Dr. Benson Norway. You will probably need EGD. Note also that I do think her cough is reflux related. But if it becomes more infectious type related then please let me know.  On discussion regarding your described raw sensation in lower chest which you thought was esophagus related. This is likely the case but if you have any chest pain advise to be seen in the emergency department.   For intermittent anxiety will refill your Ativan. You do take it very sparingly and advise continue to take it that way.  Follow-up this Friday or as needed.

## 2017-03-20 NOTE — Progress Notes (Signed)
Subjective:    Patient ID: James [name], male    DOB: 01-20-1941, 76 y.o.   MRN: [id number]  HPI For one week pt has felt  faint fatigue, mild dizzy, nausea but no vomiting.  Pt states dizziness. It is low level dizziness. No spinning. He states he had low level constant dizziness in past years ago in past. In past meclizine. I don't see scan of head in epic. He has low level faint ha. He thinks some faint minimal slight change possible. But he really can't say blurred. Last saw optometrist just year or two ago. Only uses reading glasses.  No ear pressure or pain. No obvious nasal congestion. Night before dizziness started his  balance was off. He remembers walking down hall and hitting hall wall.  Since this has come on still jogging 2-4 miles 3 times a week  Pt also states some upset stomach and gasy sensation when he eats. He states will burp. Off and on for at least a year. Pt has seen Dr. Benson Norway in the past. Pt states Dr. Benson Norway gave him steroids for his stomach. He is not aware if he was told has esophalic esophagatis. He reports raw feeling deep in his distal sternum area. Pt indicates he thinks sensation in chest is esophagitis. He declines ekg today.  Raw sensation in esphogus or chest comes and goes   Recently at night some faint dry cough when lies flat on his back. But really denies any obvoius sinus pressure. No chest congestion, no fever, non chills or sweats.  Pt has some rare severe anxiety related to life. He uses ativan very rarely.     Review of Systems  Constitutional: Positive for fatigue. Negative for chills and fever.       Pt still jogging 2-4 miles 3-4 time this past week.  HENT: Negative for congestion, facial swelling, nosebleeds, sinus pressure, sneezing and sore throat.   Eyes: Positive for visual disturbance. Negative for photophobia, pain, discharge, redness and itching.       A visual disturbance which he describes as not seeing as. crisp is usually does.    Respiratory: Negative for cough, chest tightness, shortness of breath and wheezing.   Cardiovascular: Negative for chest pain and palpitations.  Gastrointestinal: Positive for abdominal pain. Negative for abdominal distention, blood in stool, constipation, diarrhea, nausea and rectal pain.  Genitourinary: Negative for difficulty urinating, dysuria, flank pain, scrotal swelling, testicular pain and urgency.  Musculoskeletal: Negative for back pain and neck pain.  Skin: Negative for rash.  Neurological: Positive for dizziness and headaches. Negative for syncope, speech difficulty, weakness and numbness.       Very faint low level ha per pt report  Hematological: Negative for adenopathy. Does not bruise/bleed easily.  Psychiatric/Behavioral: Negative for behavioral problems, confusion, dysphoric mood and suicidal ideas. The patient is not nervous/anxious.     Past Medical History:  Diagnosis Date  . AAA (abdominal aortic aneurysm) without rupture ([place]) 09/08/2013  . Abdominal bloating 05/19/2016  . Anxiety state 05/18/2014  . Atrial fibrillation ([place])   . 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 Benign paroxysmal positional vertigo 04/06/2013  . CAD (coronary artery disease)    nonobstructive by cath 2008; nonobstructive by Cardiac CT 11/2011  . Cervical spine fracture (HCC) 1980   C6-7  . Diverticulosis   . Erectile dysfunction 09/08/2013  . GERD (gastroesophageal reflux disease)   . Hiatal hernia   . History of atrial fibrillation   .  History of squamous cell carcinoma 10-05-10   Dr [name], [place]  . HLD (hyperlipidemia)   . Hypothyroidism   . Medicare annual wellness visit, subsequent 03/16/2014   Sees [facility] orth[facility] Follows with Dr Johnsie Cancel of cardiology Sees [facility] for colonoscopy, last colonoscopy in 2013   . Mitral valve prolapse   . Neck pain 05/03/2015  . Preventative health care 03/16/2014  . 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 Syncope 02/03/2013  . 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     Social History   Social History  . Marital status: Single    Spouse name: N/A  . Number of children: N/A  . Years of education: N/A   Occupational History  . Chief Financial Officer     Retired   Social History Main Topics  . Smoking status: Never Smoker  . Smokeless tobacco: Never Used  . Alcohol use Yes     Comment: 2 glasses red wine/day  . Drug use: No  . Sexual activity: Yes   Other Topics Concern  . Not on file   Social History Narrative  . No narrative on file    Past Surgical History:  Procedure Laterality Date  . APPENDECTOMY  05/2011  . CARDIAC CATHETERIZATION  01/03/12   30% ostial LAD stenosis, ostial 30-40% D1 stenosis, smooth and normal left main and RCA; LVEF 65%  . CHOLECYSTECTOMY N/A 08/25/2014   Procedure: LAPAROSCOPIC CHOLECYSTECTOMY WITH INTRAOPERATIVE CHOLANGIOGRAM;  Surgeon: [name], MD;  Location: [facility];  Service: General;  Laterality: N/A;  . INGUINAL HERNIA REPAIR  07/2010   right  . KNEE ARTHROPLASTY     bilat  . LEFT HEART CATHETERIZATION WITH CORONARY ANGIOGRAM N/A 01/03/2012   Procedure: LEFT HEART CATHETERIZATION WITH CORONARY ANGIOGRAM;  Surgeon: [name], MD;  Location: [facility] CATH LAB;  Service: Cardiovascular;  Laterality: N/A;  . MRI  10/31/2016  . TONSILLECTOMY  1947  . tumor removal  1958   "fatty tumor"    Family History  Problem Relation Age of Onset  . Heart disease Mother        MI 67s  . Stroke Mother   . Hypertension Mother   . Heart disease Maternal Grandfather        MI 70-80s  . Heart disease Maternal Grandmother        MI 70-80s  . Lung disease Father        penumonitis  . Cancer Sister        colon  . Cancer Paternal Uncle        GI: colon he thinks    Allergies  Allergen Reactions  . Sulfa Antibiotics Other (See Comments)    Childhood allergy    Current Outpatient Prescriptions on File Prior to Visit  Medication Sig Dispense Refill  .  aspirin EC 81 MG tablet Take 81 mg by mouth daily.    Marland Kitchen atorvastatin (LIPITOR) 20 MG tablet Take 1 tablet (20 mg total) by mouth daily at 6 PM. 90 tablet 1  . Calcium Carb-Cholecalciferol (CALCIUM 1000 + D PO) Take 1 tablet by mouth daily.    . Dutasteride-Tamsulosin HCl (JALYN) 0.5-0.4 MG CAPS Take 1 capsule by mouth at bedtime. 90 capsule 1  . Dutasteride-Tamsulosin HCl 0.5-0.4 MG CAPS TAKE 1 CAPSULE BY MOUTH EVERY NIGHT AT BEDTIME 90 capsule 0  . levothyroxine (SYNTHROID, LEVOTHROID) 100 MCG tablet Take 1 tablet (100 mcg total) by mouth daily before breakfast. 30 tablet 5  . LORazepam (  ATIVAN) 0.5 MG tablet Take 1 tablet (0.5 mg total) by mouth 2 (two) times daily as needed for anxiety. 30 tablet 0  . meclizine (ANTIVERT) 25 MG tablet Take 1 tablet (25 mg total) by mouth 2 (two) times daily as needed for dizziness or nausea. 30 tablet 2  . Milk Thistle 1000 MG CAPS Take 1 capsule by mouth daily.    . mirtazapine (REMERON) 15 MG tablet Take 1 tablet (15 mg total) by mouth at bedtime. 90 tablet 2  . Multiple Vitamins-Minerals (MULTIVITAMINS THER. W/MINERALS) TABS Take 1 tablet by mouth daily.    . pantoprazole (PROTONIX) 40 MG tablet Take 1 tablet (40 mg total) by mouth 2 (two) times daily before a meal. 60 tablet 3  . Probiotic Product (PROBIOTIC DAILY PO) Take by mouth.    . sildenafil (REVATIO) 20 MG tablet TAKE 1 TO 5 TABLETS BY MOUTH DAILY AS NEEDED 35 tablet 0  . sildenafil (REVATIO) 20 MG tablet TAKE 1 TO 5 TABLETS BY MOUTH DAILY AS NEEDED 35 tablet 0  . Testosterone 30 MG/ACT SOLN APPLY 1 PUMP UNDER EACH ARM EVERY MORNING AS DIRECTED 90 mL 1  . zolpidem (AMBIEN) 10 MG tablet TAKE 1/2 TO 1 TABLET BY MOUTH AT BEDTIMEAS NEEDED FOR SLEEP 30 tablet 5  . nitroGLYCERIN (NITROSTAT) 0.4 MG SL tablet Place 1 tablet (0.4 mg total) under the tongue every 5 (five) minutes x 3 doses as needed for chest pain. (Patient not taking: Reported on 08/24/2016) 25 tablet 3   No current facility-administered  medications on file prior to visit.     BP 100/68   Pulse 60   Temp 98 F (36.7 C) (Oral)   Ht 5\' 10"  (1.778 m)   Wt 154 lb (69.9 kg)   SpO2 98%   BMI 22.10 kg/m       Objective:   Physical Exam  General  Mental Status - Alert. General Appearance - Well groomed. Not in acute distress.  Skin Rashes- No Rashes.  HEENT Head- Normal. Ear Auditory Canal - Left- Normal. Right - Normal.Tympanic Membrane- Left- Normal. Right- Normal. Eye Sclera/Conjunctiva- Left- Normal. Right- Normal. Nose & Sinuses Nasal Mucosa- Left-  Not Boggy and Congested. Right- not   Boggy and  Congested.Bilateral no  maxillary and no  frontal sinus pressure. Mouth & Throat Lips: Upper Lip- Normal: no dryness, cracking, pallor, cyanosis, or vesicular eruption. Lower Lip-Normal: no dryness, cracking, pallor, cyanosis or vesicular eruption. Buccal Mucosa- Bilateral- No Aphthous ulcers. Oropharynx- No Discharge or Erythema. Tonsils: Characteristics- Bilateral- No Erythema or Congestion. Size/Enlargement- Bilateral- No enlargement. Discharge- bilateral-None.  Neck Neck- Supple. No Masses.   Chest and Lung Exam Auscultation: Breath Sounds:-Clear even and unlabored.  Cardiovascular Auscultation:Rythm- Regular, rate and rhythm. Murmurs & Other Heart Sounds:Ausculatation of the heart reveal- No Murmurs.  Lymphatic Head & Neck General Head & Neck Lymphatics: Bilateral: Description- No Localized lymphadenopathy.       Assessment & Plan:  For your dizziness, I am prescribing meclizine. You do have some associated symptoms with the dizziness that are very mild in intensity. So if your dizziness does not improve significantly by Friday I plan to order CT of head without contrast. If your dizziness and associated symptoms worsen before then please let me know and I would go ahead and place order before Friday. If any severe dizziness with neurologic signs and symptoms as discussed then ED evaluation  strongly recommended.  For abdomen/GERD type symptoms continue your protonix and advised that she can get  ranitidine over-the-counter. I will go ahead and place referral to GI M.D. Dr. Benson Norway. You will probably need EGD. Note also that I do think her cough is reflux related. But if it becomes more infectious type related then please let me know.  On discussion regarding your described raw sensation in lower chest which you thought was esophagus related. This is likely the case but if you have any chest pain advise to be seen in the emergency department.   For intermittent anxiety will refill your Ativan. You do take it very sparingly and advise continue to take it that way.  Follow-up this Friday or as needed.  Hideko Esselman, Percell Miller, PA-C

## 2017-03-20 NOTE — Telephone Encounter (Addendum)
Patient being seem today @ 1:15 by E. Saguier, PA-C. Advised patient to go to ED if his symptoms worsened. Patient agreed.

## 2017-03-20 NOTE — Progress Notes (Signed)
Pt comes in today stating that he's been feeling dazed and dizzy for about 5 days. Slight nausea  sinus Complains of "rawness in chest and burping" that he states is worse in the afternoon  Drainage from sinuses at night and tightness in chest when laying down

## 2017-03-21 LAB — COMPREHENSIVE METABOLIC PANEL
ALBUMIN: 4.2 g/dL (ref 3.5–5.2)
ALT: 15 U/L (ref 0–53)
AST: 19 U/L (ref 0–37)
Alkaline Phosphatase: 57 U/L (ref 39–117)
BUN: 13 mg/dL (ref 6–23)
CHLORIDE: 100 meq/L (ref 96–112)
CO2: 30 meq/L (ref 19–32)
Calcium: 9.2 mg/dL (ref 8.4–10.5)
Creatinine, Ser: 0.79 mg/dL (ref 0.40–1.50)
GFR: 101.01 mL/min (ref >60.00)
Glucose, Bld: 84 mg/dL (ref 70–99)
POTASSIUM: 4.5 meq/L (ref 3.5–5.1)
SODIUM: 135 meq/L (ref 135–145)
TOTAL PROTEIN: 6.8 g/dL (ref 6.0–8.3)
Total Bilirubin: 0.7 mg/dL (ref 0.2–1.2)

## 2017-03-21 LAB — CBC WITH DIFFERENTIAL/PLATELET
BASOS PCT: 0.5 % (ref 0.0–3.0)
Basophils Absolute: 0 10*3/uL (ref 0.0–0.1)
EOS PCT: 2.6 % (ref 0.0–5.0)
Eosinophils Absolute: 0.2 10*3/uL (ref 0.0–0.7)
HEMATOCRIT: 44.1 % (ref 39.0–52.0)
HEMOGLOBIN: 14.9 g/dL (ref 13.0–17.0)
LYMPHS PCT: 25.2 % (ref 12.0–46.0)
Lymphs Abs: 1.8 10*3/uL (ref 0.7–4.0)
MCHC: 33.8 g/dL (ref 30.0–36.0)
MCV: 95 fl (ref 78.0–100.0)
Monocytes Absolute: 0.7 10*3/uL (ref 0.1–1.0)
Monocytes Relative: 9.2 % (ref 3.0–12.0)
Neutro Abs: 4.5 10*3/uL (ref 1.4–7.7)
Neutrophils Relative %: 62.5 % (ref 43.0–77.0)
Platelets: 219 10*3/uL (ref 150.0–400.0)
RBC: 4.64 Mil/uL (ref 4.22–5.81)
RDW: 13.2 % (ref 11.5–15.5)
WBC: 7.2 10*3/uL (ref 4.0–10.5)

## 2017-03-21 LAB — LIPASE: Lipase: 36 U/L (ref 11.0–59.0)

## 2017-03-23 ENCOUNTER — Telehealth: Payer: Self-pay | Admitting: Family Medicine

## 2017-03-23 NOTE — Telephone Encounter (Signed)
He should probably be seen again so we can consider imaging I could see him at the end of the morning at 11:30 on Friday

## 2017-03-23 NOTE — Telephone Encounter (Signed)
Pt's sister called in to update provider and to be advised. She said that pt was seen and prescribed medication. She said that pt is not any better. Pt is still experiencing dizziness and nausea    Please advise    CB: [phone] - [place]

## 2017-03-24 NOTE — Telephone Encounter (Signed)
Called patient this am no answer left message for patient to call back

## 2017-03-27 DIAGNOSIS — F419 Anxiety disorder, unspecified: Secondary | ICD-10-CM | POA: Diagnosis not present

## 2017-03-27 DIAGNOSIS — R1013 Epigastric pain: Secondary | ICD-10-CM | POA: Diagnosis not present

## 2017-03-27 DIAGNOSIS — M940 Chondrocostal junction syndrome [Tietze]: Secondary | ICD-10-CM | POA: Diagnosis not present

## 2017-03-28 DIAGNOSIS — H52203 Unspecified astigmatism, bilateral: Secondary | ICD-10-CM | POA: Diagnosis not present

## 2017-03-28 DIAGNOSIS — H43393 Other vitreous opacities, bilateral: Secondary | ICD-10-CM | POA: Diagnosis not present

## 2017-03-28 DIAGNOSIS — H5203 Hypermetropia, bilateral: Secondary | ICD-10-CM | POA: Diagnosis not present

## 2017-03-28 DIAGNOSIS — R51 Headache: Secondary | ICD-10-CM | POA: Diagnosis not present

## 2017-03-28 DIAGNOSIS — H524 Presbyopia: Secondary | ICD-10-CM | POA: Diagnosis not present

## 2017-04-09 DIAGNOSIS — Z23 Encounter for immunization: Secondary | ICD-10-CM | POA: Diagnosis not present

## 2017-04-18 ENCOUNTER — Other Ambulatory Visit: Payer: Self-pay | Admitting: Family Medicine

## 2017-04-18 DIAGNOSIS — E785 Hyperlipidemia, unspecified: Secondary | ICD-10-CM

## 2017-04-18 DIAGNOSIS — I10 Essential (primary) hypertension: Secondary | ICD-10-CM

## 2017-04-19 ENCOUNTER — Ambulatory Visit (HOSPITAL_BASED_OUTPATIENT_CLINIC_OR_DEPARTMENT_OTHER)
Admission: RE | Admit: 2017-04-19 | Discharge: 2017-04-19 | Disposition: A | Discharge status: Home / Self Care | Payer: Medicare Other | Source: Ambulatory Visit | Attending: Medical | Admitting: Medical

## 2017-04-19 ENCOUNTER — Ambulatory Visit (INDEPENDENT_AMBULATORY_CARE_PROVIDER_SITE_OTHER): Payer: Medicare Other | Admitting: Medical

## 2017-04-19 ENCOUNTER — Encounter: Payer: Self-pay | Admitting: Medical

## 2017-04-19 VITALS — BP 112/70 | HR 51 | Temp 97.7°F | Resp 16 | Ht 70.0 in | Wt 152.2 lb

## 2017-04-19 DIAGNOSIS — I2510 Atherosclerotic heart disease of native coronary artery without angina pectoris: Secondary | ICD-10-CM | POA: Diagnosis not present

## 2017-04-19 DIAGNOSIS — I2583 Coronary atherosclerosis due to lipid rich plaque: Secondary | ICD-10-CM

## 2017-04-19 DIAGNOSIS — I10 Essential (primary) hypertension: Secondary | ICD-10-CM

## 2017-04-19 DIAGNOSIS — G319 Degenerative disease of nervous system, unspecified: Secondary | ICD-10-CM | POA: Insufficient documentation

## 2017-04-19 DIAGNOSIS — R51 Headache: Secondary | ICD-10-CM

## 2017-04-19 DIAGNOSIS — H6122 Impacted cerumen, left ear: Secondary | ICD-10-CM | POA: Diagnosis not present

## 2017-04-19 DIAGNOSIS — J309 Allergic rhinitis, unspecified: Secondary | ICD-10-CM | POA: Diagnosis not present

## 2017-04-19 DIAGNOSIS — R42 Dizziness and giddiness: Secondary | ICD-10-CM | POA: Insufficient documentation

## 2017-04-19 DIAGNOSIS — E785 Hyperlipidemia, unspecified: Secondary | ICD-10-CM | POA: Diagnosis not present

## 2017-04-19 DIAGNOSIS — R404 Transient alteration of awareness: Secondary | ICD-10-CM | POA: Diagnosis not present

## 2017-04-19 DIAGNOSIS — R2689 Other abnormalities of gait and mobility: Secondary | ICD-10-CM | POA: Diagnosis not present

## 2017-04-19 DIAGNOSIS — R519 Headache, unspecified: Secondary | ICD-10-CM

## 2017-04-19 MED ORDER — PANTOPRAZOLE SODIUM 40 MG PO TBEC: 40.0000 mg | DELAYED_RELEASE_TABLET | Freq: Two times a day (BID) | ORAL | 3 refills | Status: DC

## 2017-04-19 MED ORDER — MECLIZINE HCL 12.5 MG PO TABS: ORAL_TABLET | ORAL | 1 refills | Status: DC

## 2017-04-19 MED ORDER — DUTASTERIDE-TAMSULOSIN HCL 0.5-0.4 MG PO CAPS: 1.0000 | ORAL_CAPSULE | Freq: Every day | ORAL | 1 refills | Status: DC

## 2017-04-19 MED ORDER — ATORVASTATIN CALCIUM 20 MG PO TABS: 20.0000 mg | ORAL_TABLET | Freq: Every day | ORAL | 1 refills | Status: DC

## 2017-04-19 MED ORDER — TESTOSTERONE 30 MG/ACT TD SOLN: TRANSDERMAL | 1 refills | Status: DC

## 2017-04-19 MED ORDER — FLUTICASONE PROPIONATE 50 MCG/ACT NA SUSP: 2.0000 | Freq: Every day | NASAL | 1 refills | Status: DC

## 2017-04-19 NOTE — Patient Instructions (Addendum)
For your recent constant dizziness and low level headache, I do think it would be beneficial to do a CT of the head without contrast.  This could also be helpful in light of your loss of awareness episode and your slight imbalance on heel to toe gait testing.  CT of head is ordered today and you can go down now to get that done.  I will call you later with the results.  I will write a prescription for meclizine 12.5 mg and you can see if this helps with the dizziness.  You can take 2 tab if needed but may sedate you some.  For recent nasal congestion and sneezing I prescribed Flonase nasal spray.  We attempted lavage of your left ear canal today.  You will need to get Debrox over-the-counter and use it daily to soften the wax.  Then come in next Tuesday for ear lavage.   Follow-up next Tuesday or as needed.  Note if the CT of the head is negative and you have persistent dizziness or you have recurrent loss of awareness episodes then would recommend referral to neurologist.  If you get any acute severe neurologic signs or symptoms at any point then be seen at ED.

## 2017-04-19 NOTE — Progress Notes (Signed)
Subjective:    Patient ID: James [name], male    DOB: 1941-06-09, 76 y.o.   MRN: [id number]  HPI  Pt in states dizziness is faint and still about exact same. Mild irritating. Faint light ha associated with this dizziness. He used meclizine but about the same but some improvement.  Also states one event in church states brief episodes of lost awareness but no loc. She had profuse sweating and some trembling upper and lower ext. This happened for about 30 seconds. Dizziness that he experiences was not worse. HA did not increase.(night before event drank some alcohol, did not sleep much and earlier in day jogged 4 miles)  No incontinence. No hx of seizures.   Pt in past if he does not eat in past will get light headed or dizziness.   Pt had eaten 2 hours before episode in church.   Pt eye exam since I saw him was good. Only recommended reading glasses at optometrist.  Pt has history of some resting bradycardia but he also jogs and runs very often.  Some recent pnd and nasal congestion. Occasional  sneezing.   No hx of any head injury or any seizure.     Review of Systems  Constitutional: Negative for chills, fatigue and fever.  HENT: Positive for congestion and sneezing. Negative for drooling, ear pain, facial swelling, mouth sores, nosebleeds, postnasal drip, sinus pain, sinus pressure and tinnitus.        Left tear wax found on exam.  Respiratory: Negative for chest tightness, shortness of breath and wheezing.   Cardiovascular: Negative for chest pain and palpitations.  Gastrointestinal: Negative for abdominal pain, constipation, nausea and vomiting.  Musculoskeletal: Negative for back pain, myalgias, neck pain and neck stiffness.  Skin: Negative for rash.  Neurological: Positive for dizziness and headaches. Negative for speech difficulty, weakness, light-headedness and numbness.  Hematological: Negative for adenopathy. Does not bruise/bleed easily.  Psychiatric/Behavioral:  Negative for behavioral problems, confusion, decreased concentration, sleep disturbance and suicidal ideas. The patient is not nervous/anxious.    Past Medical History:  Diagnosis Date  . AAA (abdominal aortic aneurysm) without rupture ([place]) 09/08/2013  . Abdominal bloating 05/19/2016  . Anxiety state 05/18/2014  . Atrial fibrillation ([place])   . 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 Benign paroxysmal positional vertigo 04/06/2013  . CAD (coronary artery disease)    nonobstructive by cath 2008; nonobstructive by Cardiac CT 11/2011  . Cervical spine fracture (HCC) 1980   C6-7  . Diverticulosis   . Erectile dysfunction 09/08/2013  . GERD (gastroesophageal reflux disease)   . Hiatal hernia   . History of atrial fibrillation   . History of squamous cell carcinoma 10-05-10   Dr [name], [place]  . HLD (hyperlipidemia)   . Hypothyroidism   . Medicare annual wellness visit, subsequent 03/16/2014   Sees [facility] Follows with Dr Johnsie Cancel of cardiology Sees [facility] for colonoscopy, last colonoscopy in 2013   . Mitral valve prolapse   . Neck pain 05/03/2015  . Preventative health care 03/16/2014  . 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 Syncope 02/03/2013  . 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     Social History   Social History  . Marital status: Single    Spouse name: N/A  . Number of children: N/A  . Years of education: N/A   Occupational History  . Chief Financial Officer     Retired   Social History Main Topics  .  Smoking status: Never Smoker  . Smokeless tobacco: Never Used  . Alcohol use Yes     Comment: 2 glasses red wine/day  . Drug use: No  . Sexual activity: Yes   Other Topics Concern  . Not on file   Social History Narrative  . No narrative on file    Past Surgical History:  Procedure Laterality Date  . APPENDECTOMY  05/2011  . CARDIAC CATHETERIZATION  01/03/12   30% ostial LAD stenosis, ostial 30-40% D1 stenosis, smooth and  normal left main and RCA; LVEF 65%  . CHOLECYSTECTOMY N/A 08/25/2014   Procedure: LAPAROSCOPIC CHOLECYSTECTOMY WITH INTRAOPERATIVE CHOLANGIOGRAM;  Surgeon: [name], MD;  Location: [facility];  Service: General;  Laterality: N/A;  . INGUINAL HERNIA REPAIR  07/2010   right  . KNEE ARTHROPLASTY     bilat  . LEFT HEART CATHETERIZATION WITH CORONARY ANGIOGRAM N/A 01/03/2012   Procedure: LEFT HEART CATHETERIZATION WITH CORONARY ANGIOGRAM;  Surgeon: [name], MD;  Location: [facility] CATH LAB;  Service: Cardiovascular;  Laterality: N/A;  . MRI  10/31/2016  . TONSILLECTOMY  1947  . tumor removal  1958   "fatty tumor"    Family History  Problem Relation Age of Onset  . Heart disease Mother        MI 59s  . Stroke Mother   . Hypertension Mother   . Heart disease Maternal Grandfather        MI 70-80s  . Heart disease Maternal Grandmother        MI 70-80s  . Lung disease Father        penumonitis  . Cancer Sister        colon  . Cancer Paternal Uncle        GI: colon he thinks    Allergies  Allergen Reactions  . Sulfa Antibiotics Other (See Comments)    Childhood allergy    Current Outpatient Prescriptions on File Prior to Visit  Medication Sig Dispense Refill  . aspirin EC 81 MG tablet Take 81 mg by mouth daily.    . Calcium Carb-Cholecalciferol (CALCIUM 1000 + D PO) Take 1 tablet by mouth daily.    . Dutasteride-Tamsulosin HCl 0.5-0.4 MG CAPS TAKE 1 CAPSULE BY MOUTH EVERY NIGHT AT BEDTIME 90 capsule 0  . levothyroxine (SYNTHROID, LEVOTHROID) 100 MCG tablet Take 1 tablet (100 mcg total) by mouth daily before breakfast. 30 tablet 5  . LORazepam (ATIVAN) 0.5 MG tablet Take 1 tablet (0.5 mg total) by mouth 2 (two) times daily as needed for anxiety. 30 tablet 0  . meclizine (ANTIVERT) 12.5 MG tablet Take 1 tablet (12.5 mg total) by mouth 3 (three) times daily as needed for dizziness. 30 tablet 0  . Milk Thistle 1000 MG CAPS Take 1 capsule by mouth daily.    . mirtazapine (REMERON) 15  MG tablet Take 1 tablet (15 mg total) by mouth at bedtime. 90 tablet 2  . Multiple Vitamins-Minerals (MULTIVITAMINS THER. W/MINERALS) TABS Take 1 tablet by mouth daily.    . Probiotic Product (PROBIOTIC DAILY PO) Take by mouth.    . sildenafil (REVATIO) 20 MG tablet TAKE 1 TO 5 TABLETS BY MOUTH DAILY AS NEEDED 35 tablet 0  . sildenafil (REVATIO) 20 MG tablet TAKE 1 TO 5 TABLETS BY MOUTH DAILY AS NEEDED 35 tablet 0  . zolpidem (AMBIEN) 10 MG tablet TAKE 1/2 TO 1 TABLET BY MOUTH AT BEDTIMEAS NEEDED FOR SLEEP 30 tablet 5  . nitroGLYCERIN (NITROSTAT) 0.4 MG SL tablet Place  1 tablet (0.4 mg total) under the tongue every 5 (five) minutes x 3 doses as needed for chest pain. (Patient not taking: Reported on 08/24/2016) 25 tablet 3   No current facility-administered medications on file prior to visit.     BP 112/70   Pulse (!) 51   Temp 97.7 F (36.5 C) (Oral)   Resp 16   Ht 5\' 10"  (1.778 m)   Wt 152 lb 3.2 oz (69 kg)   SpO2 100%   BMI 21.84 kg/m       Objective:   Physical Exam  General Mental Status- Alert. General Appearance- Not in acute distress.   Skin General: Color- Normal Color. Moisture- Normal Moisture.  Neck Carotid Arteries- Normal color. Moisture- Normal Moisture. No carotid bruits. No JVD.  Chest and Lung Exam Auscultation: Breath Sounds:-Normal.  Cardiovascular Auscultation:Rythm- Regular. Murmurs & Other Heart Sounds:Auscultation of the heart reveals- No Murmurs.  Abdomen Inspection:-Inspeection Normal. Palpation/Percussion:Note:No mass. Palpation and Percussion of the abdomen reveal- Non Tender, Non Distended + BS, no rebound or guarding.    Neurologic Cranial Nerve exam:- CN III-XII intact(No nystagmus), symmetric smile. Drift Test:- No drift. Romberg Exam:- Negative.  Heal to Toe Gait exam:- mild off initial steps but also jogs daily. Finger to Nose:- Normal/Intact Strength:- 5/5 equal and symmetric strength both upper and lower  extremities.    HEENT Head- Normal. Ear Auditory Canal - Left-moderate to severe cerumen impaction before lavage. Right - Normal.Tympanic Membrane- Left- Normal. Right- Normal. Eye Sclera/Conjunctiva- Left- Normal. Right- Normal. Nose & Sinuses Nasal Mucosa- Left-  Boggy and Congested. Right-  Boggy and  Congested.Bilateral no  maxillary and no  frontal sinus pressure. Mouth & Throat Lips: Upper Lip- Normal: no dryness, cracking, pallor, cyanosis, or vesicular eruption. Lower Lip-Normal: no dryness, cracking, pallor, cyanosis or vesicular eruption. Buccal Mucosa- Bilateral- No Aphthous ulcers. Oropharynx- No Discharge or Erythema. Tonsils: Characteristics- Bilateral- No Erythema or Congestion. Size/Enlargement- Bilateral- No enlargement. Discharge- bilateral-None.  Neck Neck- Supple. No Masses.          Assessment & Plan:  For your recent constant dizziness and low level headache, I do think it would be beneficial to do a CT of the head without contrast.  This could also be helpful in light of your loss of awareness episode and your slight imbalance on heel to toe gait testing.  CT of head is ordered today and you can go down now to get that done.  I will call you later with the results.  .  I will write a prescription for meclizine 12.5 mg and you can see if this helps with the dizziness.  You can take 2 tab if needed but may sedate you some. For recent nasal congestion and sneezing I prescribed Flonase nasal spray.  We attempted lavage of your left ear canal today.  You will need to get Debrox over-the-counter and use it daily to soften the wax.  Then come in next Tuesday for ear lavage.   Follow-up next Tuesday or as needed.  Note if the CT of the head is negative and you have persistent dizziness or you have recurrent loss of awareness episodes then would recommend referral to neurologist.  If you get any acute severe neurologic signs or symptoms at any point then be seen at  ED.  Arionna Hoggard, Percell Miller, PA-C

## 2017-04-21 ENCOUNTER — Encounter: Payer: Self-pay | Admitting: Medical

## 2017-04-28 ENCOUNTER — Ambulatory Visit (INDEPENDENT_AMBULATORY_CARE_PROVIDER_SITE_OTHER): Payer: Medicare Other

## 2017-04-28 DIAGNOSIS — H6122 Impacted cerumen, left ear: Secondary | ICD-10-CM | POA: Diagnosis not present

## 2017-04-28 DIAGNOSIS — R110 Nausea: Secondary | ICD-10-CM

## 2017-04-28 NOTE — Progress Notes (Signed)
Pre visit review using our clinic tool,if applicable. No additional management support is needed unless otherwise documented below in the visit note.   Patient in for ear irrigation per order from Dr. Charlett Blake.  Left ear irrigated with success.

## 2017-05-17 ENCOUNTER — Other Ambulatory Visit: Payer: Self-pay | Admitting: Family Medicine

## 2017-05-29 ENCOUNTER — Ambulatory Visit: Payer: Medicare Other | Admitting: Family Medicine

## 2017-05-29 ENCOUNTER — Ambulatory Visit: Payer: Medicare Other | Admitting: *Deleted

## 2017-06-02 NOTE — Progress Notes (Signed)
Subjective:   James [name] is a 76 y.o. male who presents for Medicare Annual/Subsequent preventive examination.  Review of Systems:  No ROS.  Medicare Wellness Visit. Additional risk factors are reflected in the social history. Cardiac Risk Factors include: advanced age (>35men, >101 women);dyslipidemia;hypertension;male gender Sleep patterns: Takes Ambien. Sleeps 7-8 hrs Feels great when he wakes. Home Safety/Smoke Alarms: Feels safe in home. Smoke alarms in place. Lives at home with girlfriend.   Male:   CCS- last 2013 per abstract entry. Recall 5 yrs per pt. Pt will schedule appt.     PSA-  Lab Results  Component Value Date   PSA 0.51 04/23/2015   PSA 0.62 03/11/2014   PSA 0.88 01/30/2013       Objective:    Vitals: BP 108/60 (BP Location: Left Arm, Patient Position: Sitting, Cuff Size: Normal)   Pulse (!) 55   Ht 5\' 10"  (1.778 m)   Wt 157 lb 6.4 oz (71.4 kg)   SpO2 98%   BMI 22.58 kg/m   Body mass index is 22.58 kg/m.  Advanced Directives 06/05/2017 05/19/2016 05/19/2016 04/23/2015 08/21/2014 01/03/2012  Does Patient Have a Medical Advance Directive? No No No No No Patient does not have advance directive;Patient would not like information  Would patient like information on creating a medical advance directive? Yes (MAU/Ambulatory/Procedural Areas - Information given) Yes (MAU/Ambulatory/Procedural Areas - Information given) - Yes - Educational materials given - -  Pre-existing out of facility DNR order (yellow form or pink MOST form) - - - - - No    Tobacco Social History   Tobacco Use  Smoking Status Never Smoker  Smokeless Tobacco Never Used     Counseling given: Not Answered   Clinical Intake:   Pain : No/denies pain   Past Medical History:  Diagnosis Date  . AAA (abdominal aortic aneurysm) without rupture ([place]) 09/08/2013  . Abdominal bloating 05/19/2016  . Anxiety state 05/18/2014  . Atrial fibrillation ([place])   . BCC (basal cell carcinoma of skin)  06/09/2014  . Benign paroxysmal positional vertigo 04/06/2013  . CAD (coronary artery disease)    nonobstructive by cath 2008; nonobstructive by Cardiac CT 11/2011  . Cervical spine fracture ([facility]) 1980   C6-7  . Diverticulosis   . Erectile dysfunction 09/08/2013  . GERD (gastroesophageal reflux disease)   . Hiatal hernia   . History of atrial fibrillation   . History of squamous cell carcinoma 10-05-10   Dr [name], [place]  . HLD (hyperlipidemia)   . Hypothyroidism   . Medicare annual wellness visit, subsequent 03/16/2014   Sees [facility] Follows with Dr Johnsie Cancel of cardiology Sees [facility] for colonoscopy, last colonoscopy in 2013   . Mitral valve prolapse   . Neck pain 05/03/2015  . Preventative health care 03/16/2014  . 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 Syncope 02/03/2013  . 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   Past Surgical History:  Procedure Laterality Date  . APPENDECTOMY  05/2011  . CARDIAC CATHETERIZATION  01/03/12   30% ostial LAD stenosis, ostial 30-40% D1 stenosis, smooth and normal left main and RCA; LVEF 65%  . CHOLECYSTECTOMY N/A 08/25/2014   Procedure: LAPAROSCOPIC CHOLECYSTECTOMY WITH INTRAOPERATIVE CHOLANGIOGRAM;  Surgeon: [name], MD;  Location: [facility];  Service: General;  Laterality: N/A;  . INGUINAL HERNIA REPAIR  07/2010   right  . KNEE ARTHROPLASTY     bilat  . LEFT HEART CATHETERIZATION WITH CORONARY ANGIOGRAM N/A 01/03/2012  Procedure: LEFT HEART CATHETERIZATION WITH CORONARY ANGIOGRAM;  Surgeon: [name], MD;  Location: [facility] CATH LAB;  Service: Cardiovascular;  Laterality: N/A;  . MRI  10/31/2016  . TONSILLECTOMY  1947  . tumor removal  1958   "fatty tumor"   Family History  Problem Relation Age of Onset  . Heart disease Mother        MI 35s  . Stroke Mother   . Hypertension Mother   . Heart disease Maternal Grandfather        MI 70-80s  . Heart disease Maternal Grandmother        MI 70-80s    . Lung disease Father        penumonitis  . Cancer Sister        colon  . Cancer Paternal Uncle        GI: colon he thinks   Social History   Socioeconomic History  . Marital status: Single    Spouse name: None  . Number of children: None  . Years of education: None  . Highest education level: None  Social Needs  . Financial resource strain: None  . Food insecurity - worry: None  . Food insecurity - inability: None  . Transportation needs - medical: None  . Transportation needs - non-medical: None  Occupational History  . Occupation: Chief Financial Officer    Comment: Retired  Tobacco Use  . Smoking status: Never Smoker  . Smokeless tobacco: Never Used  Substance and Sexual Activity  . Alcohol use: Yes    Comment: 2 glasses red wine/day  . Drug use: No  . Sexual activity: Yes  Other Topics Concern  . None  Social History Narrative  . None    Outpatient Encounter Medications as of 06/05/2017  Medication Sig  . aspirin EC 81 MG tablet Take 81 mg by mouth daily.  Marland Kitchen atorvastatin (LIPITOR) 20 MG tablet Take 1 tablet (20 mg total) by mouth daily at 6 PM.  . Calcium Carb-Cholecalciferol (CALCIUM 1000 + D PO) Take 1 tablet by mouth daily.  . Dutasteride-Tamsulosin HCl (JALYN) 0.5-0.4 MG CAPS Take 1 capsule by mouth at bedtime.  . fluticasone (FLONASE) 50 MCG/ACT nasal spray Place 2 sprays into both nostrils daily.  Marland Kitchen levothyroxine (SYNTHROID, LEVOTHROID) 100 MCG tablet Take 1 tablet (100 mcg total) by mouth daily before breakfast.  . LORazepam (ATIVAN) 0.5 MG tablet Take 1 tablet (0.5 mg total) by mouth 2 (two) times daily as needed for anxiety.  . meclizine (ANTIVERT) 12.5 MG tablet 1-2 tab po tid prn dizziness  . Milk Thistle 1000 MG CAPS Take 1 capsule by mouth daily.  . mirtazapine (REMERON) 15 MG tablet TAKE 1 TABLET BY MOUTH EVERY DAY  . Multiple Vitamins-Minerals (MULTIVITAMINS THER. W/MINERALS) TABS Take 1 tablet by mouth daily.  . pantoprazole (PROTONIX) 40 MG tablet Take 1  tablet (40 mg total) by mouth 2 (two) times daily before a meal.  . Probiotic Product (PROBIOTIC DAILY PO) Take by mouth.  . sildenafil (REVATIO) 20 MG tablet TAKE 1 TO 5 TABLETS BY MOUTH DAILY AS NEEDED  . Testosterone 30 MG/ACT SOLN APPLY 1 PUMP UNDER EACH ARM EVERY MORNING AS DIRECTED  . zolpidem (AMBIEN) 10 MG tablet TAKE 1/2 TO 1 TABLET BY MOUTH AT BEDTIMEAS NEEDED FOR SLEEP  . nitroGLYCERIN (NITROSTAT) 0.4 MG SL tablet Place 1 tablet (0.4 mg total) under the tongue every 5 (five) minutes x 3 doses as needed for chest pain. (Patient not taking: Reported on 08/24/2016)  . [  DISCONTINUED] Dutasteride-Tamsulosin HCl 0.5-0.4 MG CAPS TAKE 1 CAPSULE BY MOUTH EVERY NIGHT AT BEDTIME  . [DISCONTINUED] meclizine (ANTIVERT) 12.5 MG tablet Take 1 tablet (12.5 mg total) by mouth 3 (three) times daily as needed for dizziness.  . [DISCONTINUED] sildenafil (REVATIO) 20 MG tablet TAKE 1 TO 5 TABLETS BY MOUTH DAILY AS NEEDED   No facility-administered encounter medications on file as of 06/05/2017.     Activities of Daily Living In your present state of health, do you have any difficulty performing the following activities: 06/05/2017  Hearing? N  Vision? N  Difficulty concentrating or making decisions? N  Walking or climbing stairs? N  Dressing or bathing? N  Doing errands, shopping? N  Preparing Food and eating ? N  Using the Toilet? N  In the past six months, have you accidently leaked urine? N  Do you have problems with loss of bowel control? N  Managing your Medications? N  Managing your Finances? N  Housekeeping or managing your Housekeeping? N  Some recent data might be hidden    Patient Care Team: [name], MD as PCP - General (Family Medicine) Hollar, Katharine Look, MD as Referring Physician (Dermatology) Nahser, Wonda Cheng, MD as Consulting Physician (Cardiology) Carol Ada, MD as Consulting Physician (Gastroenterology)   Assessment:   This is a routine wellness examination  for [employer]. Physical assessment deferred to PCP.  Exercise Activities and Dietary recommendations Current Exercise Habits: Home exercise routine, Time (Minutes): 50, Frequency (Times/Week): 3, Weekly Exercise (Minutes/Week): 150, Intensity: Intense   Diet (meal preparation, eat out, water intake, caffeinated beverages, dairy products, fruits and vegetables): in general, a "healthy" diet     Goals    . improve muscle strength and endurance.       Fall Risk Fall Risk  06/05/2017 05/26/2016 05/19/2016 04/23/2015 03/11/2014  Falls in the past year? No No No No No  Comment - Emmi Telephone Survey: data to providers prior to load - - -    Depression Screen PHQ 2/9 Scores 06/05/2017 05/19/2016 04/23/2015 03/11/2014  PHQ - 2 Score 0 0 0 0    Cognitive Function MMSE - Mini Mental State Exam 06/05/2017 05/19/2016  Orientation to time 5 5  Orientation to Place 5 5  Registration 3 3  Attention/ Calculation 5 5  Recall 3 3  Language- name 2 objects 2 2  Language- repeat 1 1  Language- follow 3 step command 3 3  Language- read & follow direction 1 1  Write a sentence 1 1  Copy design 1 1  Total score 30 30        Immunization History  Administered Date(s) Administered  . Influenza Whole 03/20/2012  . Influenza, High Dose Seasonal PF 04/02/2013, 04/23/2015  . Influenza,inj,Quad PF,6+ Mos 03/11/2014  . Influenza-Unspecified 03/03/2016  . Pneumococcal Conjugate-13 03/11/2014  . Pneumococcal Polysaccharide-23 04/18/2009  . Tdap 08/05/2009  . Zoster 09/10/2008    Screening Tests Health Maintenance  Topic Date Due  . TETANUS/TDAP  08/06/2019  . INFLUENZA VACCINE  Completed  . PNA vac Low Risk Adult  Completed    Plan:   Follow up with PCP today as scheduled  Continue to eat heart healthy diet (full of fruits, vegetables, whole grains, lean protein, water--limit salt, fat, and sugar intake) and increase physical activity as tolerated.  Continue doing brain stimulating  activities (puzzles, reading, adult coloring books, staying active) to keep memory sharp.    Please call Dr.Hung to schedule your colonoscopy. [phone].  I have  personally reviewed and noted the following in the patient's chart:   . Medical and social history . Use of alcohol, tobacco or illicit drugs  . Current medications and supplements . Functional ability and status . Nutritional status . Physical activity . Advanced directives . List of other physicians . Hospitalizations, surgeries, and ER visits in previous 12 months . Vitals . Screenings to include cognitive, depression, and falls . Referrals and appointments  In addition, I have reviewed and discussed with patient certain preventive protocols, quality metrics, and best practice recommendations. A written personalized care plan for preventive services as well as general preventive health recommendations were provided to patient.     Shela Nevin, [place]  06/05/2017

## 2017-06-05 ENCOUNTER — Ambulatory Visit: Payer: Medicare Other | Admitting: *Deleted

## 2017-06-05 ENCOUNTER — Telehealth: Payer: Self-pay

## 2017-06-05 ENCOUNTER — Encounter: Payer: Self-pay | Admitting: Family Medicine

## 2017-06-05 ENCOUNTER — Ambulatory Visit (INDEPENDENT_AMBULATORY_CARE_PROVIDER_SITE_OTHER): Payer: Medicare Other | Admitting: Family Medicine

## 2017-06-05 VITALS — BP 108/60 | HR 55 | Ht 70.0 in | Wt 157.4 lb

## 2017-06-05 DIAGNOSIS — I2583 Coronary atherosclerosis due to lipid rich plaque: Secondary | ICD-10-CM | POA: Diagnosis not present

## 2017-06-05 DIAGNOSIS — G4700 Insomnia, unspecified: Secondary | ICD-10-CM

## 2017-06-05 DIAGNOSIS — I10 Essential (primary) hypertension: Secondary | ICD-10-CM | POA: Diagnosis not present

## 2017-06-05 DIAGNOSIS — E039 Hypothyroidism, unspecified: Secondary | ICD-10-CM | POA: Diagnosis not present

## 2017-06-05 DIAGNOSIS — F411 Generalized anxiety disorder: Secondary | ICD-10-CM | POA: Diagnosis not present

## 2017-06-05 DIAGNOSIS — R7989 Other specified abnormal findings of blood chemistry: Secondary | ICD-10-CM | POA: Diagnosis not present

## 2017-06-05 DIAGNOSIS — I2510 Atherosclerotic heart disease of native coronary artery without angina pectoris: Secondary | ICD-10-CM

## 2017-06-05 DIAGNOSIS — E785 Hyperlipidemia, unspecified: Secondary | ICD-10-CM

## 2017-06-05 DIAGNOSIS — Z0000 Encounter for general adult medical examination without abnormal findings: Secondary | ICD-10-CM

## 2017-06-05 MED ORDER — PANTOPRAZOLE SODIUM 40 MG PO TBEC: 40.0000 mg | DELAYED_RELEASE_TABLET | Freq: Two times a day (BID) | ORAL | 1 refills | Status: DC

## 2017-06-05 MED ORDER — LEVOTHYROXINE SODIUM 100 MCG PO TABS: 100.0000 ug | ORAL_TABLET | Freq: Every day | ORAL | 1 refills | Status: DC

## 2017-06-05 MED ORDER — ATORVASTATIN CALCIUM 20 MG PO TABS: 20.0000 mg | ORAL_TABLET | Freq: Every day | ORAL | 1 refills | Status: DC

## 2017-06-05 MED ORDER — TESTOSTERONE 30 MG/ACT TD SOLN: TRANSDERMAL | 1 refills | Status: DC

## 2017-06-05 MED ORDER — DUTASTERIDE-TAMSULOSIN HCL 0.5-0.4 MG PO CAPS: 1.0000 | ORAL_CAPSULE | Freq: Every day | ORAL | 1 refills | Status: DC

## 2017-06-05 MED ORDER — LORAZEPAM 0.5 MG PO TABS: 0.5000 mg | ORAL_TABLET | Freq: Two times a day (BID) | ORAL | 5 refills | Status: DC | PRN

## 2017-06-05 MED ORDER — ZOLPIDEM TARTRATE 10 MG PO TABS: ORAL_TABLET | ORAL | 5 refills | Status: DC

## 2017-06-05 NOTE — Assessment & Plan Note (Signed)
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  Has a couple of presentations coming up soon so was allowed a refill.  He is aware of the possible dangers of the medication and his UDS and contract are up-to-date.

## 2017-06-05 NOTE — Telephone Encounter (Signed)
PA initiated via Covermymeds; KEY: [id number]. Received real time PA approval. Effective 05/06/2017 through 06/05/2018.

## 2017-06-05 NOTE — Assessment & Plan Note (Signed)
Encouraged good sleep hygiene such as dark, quiet room. No blue/green glowing lights such as computer screens in bedroom. No alcohol or stimulants in evening. Cut down on caffeine as able. Regular exercise is helpful but not just prior to bed time. Refill given on A,bien

## 2017-06-05 NOTE — Assessment & Plan Note (Signed)
On Levothyroxine, continue to monitor 

## 2017-06-05 NOTE — Assessment & Plan Note (Signed)
Well controlled, no changes to meds. Encouraged heart healthy diet such as the DASH diet and exercise as tolerated.  °

## 2017-06-05 NOTE — Assessment & Plan Note (Signed)
Check level continue meds

## 2017-06-05 NOTE — Patient Instructions (Addendum)
Continue to eat heart healthy diet (full of fruits, vegetables, whole grains, lean protein, water--limit salt, fat, and sugar intake) and increase physical activity as tolerated.  Continue doing brain stimulating activities (puzzles, reading, adult coloring books, staying active) to keep memory sharp.    Please call Dr.Hung to schedule your colonoscopy. [phone].  Shingrix is the new shingles shot, 2 shots over 2-6 months at pharmacy   James [name] , Thank you for taking time to come for your Medicare Wellness Visit. I appreciate your ongoing commitment to your health goals. Please review the following plan we discussed and let me know if I can assist you in the future.   These are the goals we discussed: Goals    . improve muscle strength and endurance.       This is a list of the screening recommended for you and due dates:  Health Maintenance  Topic Date Due  . Tetanus Vaccine  08/06/2019  . Flu Shot  Completed  . Pneumonia vaccines  Completed   Zyrtec/Cetirizine 10 mg tabs 1 daily and Flonase/Fluticasone nose spray daily    Health Maintenance, Male A healthy lifestyle and preventive care is important for your health and wellness. Ask your health care provider about what schedule of regular examinations is right for you. What should I know about weight and diet? Eat a Healthy Diet  Eat plenty of vegetables, fruits, whole grains, low-fat dairy products, and lean protein.  Do not eat a lot of foods high in solid fats, added sugars, or salt.  Maintain a Healthy Weight Regular exercise can help you achieve or maintain a healthy weight. You should:  Do at least 150 minutes of exercise each week. The exercise should increase your heart rate and make you sweat (moderate-intensity exercise).  Do strength-training exercises at least twice a week.  Watch Your Levels of Cholesterol and Blood Lipids  Have your blood tested for lipids and cholesterol every 5 years starting at 77  years of age. If you are at high risk for heart disease, you should start having your blood tested when you are 77 years old. You may need to have your cholesterol levels checked more often if: ? Your lipid or cholesterol levels are high. ? You are older than 77 years of age. ? You are at high risk for heart disease.  What should I know about cancer screening? Many types of cancers can be detected early and may often be prevented. Lung Cancer  You should be screened every year for lung cancer if: ? You are a current smoker who has smoked for at least 30 years. ? You are a former smoker who has quit within the past 15 years.  Talk to your health care provider about your screening options, when you should start screening, and how often you should be screened.  Colorectal Cancer  Routine colorectal cancer screening usually begins at 77 years of age and should be repeated every 5-10 years until you are 77 years old. You may need to be screened more often if early forms of precancerous polyps or small growths are found. Your health care provider may recommend screening at an earlier age if you have risk factors for colon cancer.  Your health care provider may recommend using home test kits to check for hidden blood in the stool.  A small camera at the end of a tube can be used to examine your colon (sigmoidoscopy or colonoscopy). This checks for the earliest forms of colorectal  cancer.  Prostate and Testicular Cancer  Depending on your age and overall health, your health care provider may do certain tests to screen for prostate and testicular cancer.  Talk to your health care provider about any symptoms or concerns you have about testicular or prostate cancer.  Skin Cancer  Check your skin from head to toe regularly.  Tell your health care provider about any new moles or changes in moles, especially if: ? There is a change in a mole's size, shape, or color. ? You have a mole that is  larger than a pencil eraser.  Always use sunscreen. Apply sunscreen liberally and repeat throughout the day.  Protect yourself by wearing long sleeves, pants, a wide-brimmed hat, and sunglasses when outside.  What should I know about heart disease, diabetes, and high blood pressure?  If you are 68-33 years of age, have your blood pressure checked every 3-5 years. If you are 14 years of age or older, have your blood pressure checked every year. You should have your blood pressure measured twice-once when you are at a hospital or clinic, and once when you are not at a hospital or clinic. Record the average of the two measurements. To check your blood pressure when you are not at a hospital or clinic, you can use: ? An automated blood pressure machine at a pharmacy. ? A home blood pressure monitor.  Talk to your health care provider about your target blood pressure.  If you are between 7-30 years old, ask your health care provider if you should take aspirin to prevent heart disease.  Have regular diabetes screenings by checking your fasting blood sugar level. ? If you are at a normal weight and have a low risk for diabetes, have this test once every three years after the age of 67. ? If you are overweight and have a high risk for diabetes, consider being tested at a younger age or more often.  A one-time screening for abdominal aortic aneurysm (AAA) by ultrasound is recommended for men aged 68-75 years who are current or former smokers. What should I know about preventing infection? Hepatitis B If you have a higher risk for hepatitis B, you should be screened for this virus. Talk with your health care provider to find out if you are at risk for hepatitis B infection. Hepatitis C Blood testing is recommended for:  Everyone born from [birth year] through 1966.  Anyone with known risk factors for hepatitis C.  Sexually Transmitted Diseases (STDs)  You should be screened each year for STDs  including gonorrhea and chlamydia if: ? You are sexually active and are younger than 77 years of age. ? You are older than 77 years of age and your health care provider tells you that you are at risk for this type of infection. ? Your sexual activity has changed since you were last screened and you are at an increased risk for chlamydia or gonorrhea. Ask your health care provider if you are at risk.  Talk with your health care provider about whether you are at high risk of being infected with HIV. Your health care provider may recommend a prescription medicine to help prevent HIV infection.  What else can I do?  Schedule regular health, dental, and eye exams.  Stay current with your vaccines (immunizations).  Do not use any tobacco products, such as cigarettes, chewing tobacco, and e-cigarettes. If you need help quitting, ask your health care provider.  Limit alcohol intake to no more  than 2 drinks per day. One drink equals 12 ounces of beer, 5 ounces of wine, or 1 ounces of hard liquor.  Do not use street drugs.  Do not share needles.  Ask your health care provider for help if you need support or information about quitting drugs.  Tell your health care provider if you often feel depressed.  Tell your health care provider if you have ever been abused or do not feel safe at home. This information is not intended to replace advice given to you by your health care provider. Make sure you discuss any questions you have with your health care provider. Document Released: 12/03/2007 Document Revised: 02/03/2016 Document Reviewed: 03/10/2015 Elsevier Interactive Patient Education  2018 [place] increased hydration and fiber in diet. Daily probiotics. If bowels not moving can use MOM 2 tbls po in 4 oz of warm prune juice by mouth every 2-3 days. If no results then repeat in 4 hours with  Dulcolax suppository pr, may repeat again in 4 more hours as needed. Seek care if  symptoms worsen. Consider daily Miralax and/or Dulcolax if symptoms persist.   miralax and benefiber together once or twice daily as needed

## 2017-06-05 NOTE — Assessment & Plan Note (Signed)
Tolerating statin, encouraged heart healthy diet, avoid trans fats, minimize simple carbs and saturated fats. Increase exercise as tolerated 

## 2017-06-05 NOTE — Progress Notes (Signed)
Patient ID: James [name], male   DOB: 02-22-1941, 76 y.o.   MRN: [id number]   Subjective:    Patient ID: James [name], male    DOB: [birth year], 76 y.o.   MRN: [id number]  Chief Complaint  Patient presents with  . Medicare Wellness    HPI Patient is in today for follow-up.  Overall he is doing well.  No recent febrile illness or acute hospitalizations.  He does note some postnasal drip with irritation in his throat most notably in the evenings.  He has no production of any sputum.  No fevers or chills.  He also notes that his bowels have slowed slightly no bloody or tarry stool.  He does still move his bowels most days but they are smaller and more firm.  No pain associated.  Appetite is good. Denies CP/palp/SOB/HA/fevers or GU c/o. Taking meds as prescribed  Past Medical History:  Diagnosis Date  . AAA (abdominal aortic aneurysm) without rupture ([place]) 09/08/2013  . Abdominal bloating 05/19/2016  . Anxiety state 05/18/2014  . Atrial fibrillation ([place])   . 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 Benign paroxysmal positional vertigo 04/06/2013  . CAD (coronary artery disease)    nonobstructive by cath 2008; nonobstructive by Cardiac CT 11/2011  . Cervical spine fracture ([facility]) 1980   C6-7  . Diverticulosis   . Erectile dysfunction 09/08/2013  . GERD (gastroesophageal reflux disease)   . Hiatal hernia   . History of atrial fibrillation   . History of squamous cell carcinoma 10-05-10   Dr [name], [place]  . HLD (hyperlipidemia)   . Hypothyroidism   . Medicare annual wellness visit, subsequent 03/16/2014   Sees [place] ortho Follows with Dr Johnsie Cancel of cardiology Sees [facility] for colonoscopy, last colonoscopy in 2013   . Mitral valve prolapse   . Neck pain 05/03/2015  . Preventative health care 03/16/2014  . 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 Syncope 02/03/2013  . 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    Past Surgical History:  Procedure  Laterality Date  . APPENDECTOMY  05/2011  . CARDIAC CATHETERIZATION  01/03/12   30% ostial LAD stenosis, ostial 30-40% D1 stenosis, smooth and normal left main and RCA; LVEF 65%  . CHOLECYSTECTOMY N/A 08/25/2014   Procedure: LAPAROSCOPIC CHOLECYSTECTOMY WITH INTRAOPERATIVE CHOLANGIOGRAM;  Surgeon: [name], MD;  Location: [place];  Service: General;  Laterality: N/A;  . INGUINAL HERNIA REPAIR  07/2010   right  . KNEE ARTHROPLASTY     bilat  . LEFT HEART CATHETERIZATION WITH CORONARY ANGIOGRAM N/A 01/03/2012   Procedure: LEFT HEART CATHETERIZATION WITH CORONARY ANGIOGRAM;  Surgeon: [name], MD;  Location: [facility] CATH LAB;  Service: Cardiovascular;  Laterality: N/A;  . MRI  10/31/2016  . TONSILLECTOMY  1947  . tumor removal  1958   "fatty tumor"    Family History  Problem Relation Age of Onset  . Heart disease Mother        MI 34s  . Stroke Mother   . Hypertension Mother   . Heart disease Maternal Grandfather        MI 70-80s  . Heart disease Maternal Grandmother        MI 70-80s  . Lung disease Father        penumonitis  . Cancer Sister        colon  . Cancer Paternal Uncle        GI: colon he thinks  Social History   Socioeconomic History  . Marital status: Single    Spouse name: Not on file  . Number of children: Not on file  . Years of education: Not on file  . Highest education level: Not on file  Social Needs  . Financial resource strain: Not on file  . Food insecurity - worry: Not on file  . Food insecurity - inability: Not on file  . Transportation needs - medical: Not on file  . Transportation needs - non-medical: Not on file  Occupational History  . Occupation: Chief Financial Officer    Comment: Retired  Tobacco Use  . Smoking status: Never Smoker  . Smokeless tobacco: Never Used  Substance and Sexual Activity  . Alcohol use: Yes    Comment: 2 glasses red wine/day  . Drug use: No  . Sexual activity: Yes  Other Topics Concern  . Not on file  Social  History Narrative  . Not on file    Outpatient Medications Prior to Visit  Medication Sig Dispense Refill  . aspirin EC 81 MG tablet Take 81 mg by mouth daily.    . Calcium Carb-Cholecalciferol (CALCIUM 1000 + D PO) Take 1 tablet by mouth daily.    . fluticasone (FLONASE) 50 MCG/ACT nasal spray Place 2 sprays into both nostrils daily. 16 g 1  . meclizine (ANTIVERT) 12.5 MG tablet 1-2 tab po tid prn dizziness 30 tablet 1  . Milk Thistle 1000 MG CAPS Take 1 capsule by mouth daily.    . mirtazapine (REMERON) 15 MG tablet TAKE 1 TABLET BY MOUTH EVERY DAY 90 tablet 2  . Multiple Vitamins-Minerals (MULTIVITAMINS THER. W/MINERALS) TABS Take 1 tablet by mouth daily.    . Probiotic Product (PROBIOTIC DAILY PO) Take by mouth.    . sildenafil (REVATIO) 20 MG tablet TAKE 1 TO 5 TABLETS BY MOUTH DAILY AS NEEDED 35 tablet 0  . atorvastatin (LIPITOR) 20 MG tablet Take 1 tablet (20 mg total) by mouth daily at 6 PM. 90 tablet 1  . Dutasteride-Tamsulosin HCl (JALYN) 0.5-0.4 MG CAPS Take 1 capsule by mouth at bedtime. 90 capsule 1  . levothyroxine (SYNTHROID, LEVOTHROID) 100 MCG tablet Take 1 tablet (100 mcg total) by mouth daily before breakfast. 30 tablet 5  . LORazepam (ATIVAN) 0.5 MG tablet Take 1 tablet (0.5 mg total) by mouth 2 (two) times daily as needed for anxiety. 30 tablet 0  . pantoprazole (PROTONIX) 40 MG tablet Take 1 tablet (40 mg total) by mouth 2 (two) times daily before a meal. 60 tablet 3  . Testosterone 30 MG/ACT SOLN APPLY 1 PUMP UNDER EACH ARM EVERY MORNING AS DIRECTED 90 mL 1  . zolpidem (AMBIEN) 10 MG tablet TAKE 1/2 TO 1 TABLET BY MOUTH AT BEDTIMEAS NEEDED FOR SLEEP 30 tablet 5  . nitroGLYCERIN (NITROSTAT) 0.4 MG SL tablet Place 1 tablet (0.4 mg total) under the tongue every 5 (five) minutes x 3 doses as needed for chest pain. (Patient not taking: Reported on 08/24/2016) 25 tablet 3  . Dutasteride-Tamsulosin HCl 0.5-0.4 MG CAPS TAKE 1 CAPSULE BY MOUTH EVERY NIGHT AT BEDTIME 90 capsule 0    . meclizine (ANTIVERT) 12.5 MG tablet Take 1 tablet (12.5 mg total) by mouth 3 (three) times daily as needed for dizziness. 30 tablet 0  . sildenafil (REVATIO) 20 MG tablet TAKE 1 TO 5 TABLETS BY MOUTH DAILY AS NEEDED 35 tablet 0   No facility-administered medications prior to visit.     Allergies  Allergen Reactions  .  Sulfa Antibiotics Other (See Comments)    Childhood allergy    Review of Systems  Constitutional: Negative for fever and malaise/fatigue.  HENT: Positive for congestion.   Eyes: Negative for blurred vision.  Respiratory: Negative for sputum production and shortness of breath.   Cardiovascular: Negative for chest pain, palpitations and leg swelling.  Gastrointestinal: Negative for abdominal pain, blood in stool and nausea.  Genitourinary: Negative for dysuria and frequency.  Musculoskeletal: Negative for falls.  Skin: Negative for rash.  Neurological: Negative for dizziness, loss of consciousness and headaches.  Endo/Heme/Allergies: Negative for environmental allergies.  Psychiatric/Behavioral: Negative for depression. The patient is not nervous/anxious.        Objective:    Physical Exam  Constitutional: He is oriented to person, place, and time. He appears well-developed and well-nourished. No distress.  HENT:  Head: Normocephalic and atraumatic.  Nose: Nose normal.  Eyes: Right eye exhibits no discharge. Left eye exhibits no discharge.  Neck: Normal range of motion. Neck supple.  Cardiovascular: Normal rate and regular rhythm.  No murmur heard. Pulmonary/Chest: Effort normal and breath sounds normal.  Abdominal: Soft. Bowel sounds are normal. There is no tenderness.  Musculoskeletal: He exhibits no edema.  Neurological: He is alert and oriented to person, place, and time.  Skin: Skin is warm and dry.  Psychiatric: He has a normal mood and affect.  Nursing note and vitals reviewed.   BP 108/60 (BP Location: Left Arm, Patient Position: Sitting, Cuff  Size: Normal)   Pulse (!) 55   Ht 5\' 10"  (1.778 m)   Wt 157 lb 6.4 oz (71.4 kg)   SpO2 98%   BMI 22.58 kg/m  Wt Readings from Last 3 Encounters:  06/05/17 157 lb 6.4 oz (71.4 kg)  04/19/17 152 lb 3.2 oz (69 kg)  03/20/17 154 lb (69.9 kg)     Lab Results  Component Value Date   WBC 7.2 03/20/2017   HGB 14.9 03/20/2017   HCT 44.1 03/20/2017   PLT 219.0 03/20/2017   GLUCOSE 84 03/20/2017   CHOL 140 11/24/2016   TRIG 84.0 11/24/2016   HDL 67.60 11/24/2016   LDLCALC 56 11/24/2016   ALT 15 03/20/2017   AST 19 03/20/2017   NA 135 03/20/2017   K 4.5 03/20/2017   CL 100 03/20/2017   CREATININE 0.79 03/20/2017   BUN 13 03/20/2017   CO2 30 03/20/2017   TSH 0.87 11/24/2016   PSA 0.51 04/23/2015   INR 1.09 08/21/2014    Lab Results  Component Value Date   TSH 0.87 11/24/2016   Lab Results  Component Value Date   WBC 7.2 03/20/2017   HGB 14.9 03/20/2017   HCT 44.1 03/20/2017   MCV 95.0 03/20/2017   PLT 219.0 03/20/2017   Lab Results  Component Value Date   NA 135 03/20/2017   K 4.5 03/20/2017   CO2 30 03/20/2017   GLUCOSE 84 03/20/2017   BUN 13 03/20/2017   CREATININE 0.79 03/20/2017   BILITOT 0.7 03/20/2017   ALKPHOS 57 03/20/2017   AST 19 03/20/2017   ALT 15 03/20/2017   PROT 6.8 03/20/2017   ALBUMIN 4.2 03/20/2017   CALCIUM 9.2 03/20/2017   ANIONGAP 6 08/21/2014   GFR 101.01 03/20/2017   Lab Results  Component Value Date   CHOL 140 11/24/2016   Lab Results  Component Value Date   HDL 67.60 11/24/2016   Lab Results  Component Value Date   LDLCALC 56 11/24/2016   Lab Results  Component Value  Date   TRIG 84.0 11/24/2016   Lab Results  Component Value Date   CHOLHDL 2 11/24/2016   No results found for: HGBA1C     Assessment & Plan:   Problem List Items Addressed This Visit    Hyperlipidemia    Tolerating statin, encouraged heart healthy diet, avoid trans fats, minimize simple carbs and saturated fats. Increase exercise as tolerated        Relevant Medications   atorvastatin (LIPITOR) 20 MG tablet   Other Relevant Orders   Lipid panel   Hypothyroidism    On Levothyroxine, continue to monitor      Relevant Medications   levothyroxine (SYNTHROID, LEVOTHROID) 100 MCG tablet   Other Relevant Orders   TSH   Low testosterone    Check level continue meds      Relevant Orders   Testosterone   Insomnia    Encouraged good sleep hygiene such as dark, quiet room. No blue/green glowing lights such as computer screens in bedroom. No alcohol or stimulants in evening. Cut down on caffeine as able. Regular exercise is helpful but not just prior to bed time. 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  Has a couple of presentations coming up soon so was allowed a refill.  He is aware of the possible dangers of the medication and his UDS and contract are up-to-date.      Relevant Medications   LORazepam (ATIVAN) 0.5 MG tablet   Essential hypertension    Well controlled, no changes to meds. Encouraged heart healthy diet such as the DASH diet and exercise as tolerated.       Relevant Medications   atorvastatin (LIPITOR) 20 MG tablet   Other Relevant Orders   CBC   Comprehensive metabolic panel    Other Visit Diagnoses    Encounter for Medicare annual wellness exam    -  Primary      I am having Lissa Merlin E. Siguenza maintain his multivitamins ther. w/minerals, Calcium Carb-Cholecalciferol (CALCIUM 1000 + D PO), nitroGLYCERIN, Probiotic Product (PROBIOTIC DAILY PO), aspirin EC, Milk Thistle, sildenafil, fluticasone, meclizine, mirtazapine, LORazepam, zolpidem, Testosterone, Dutasteride-Tamsulosin HCl, levothyroxine, atorvastatin, and pantoprazole.  Meds ordered this encounter  Medications  . LORazepam (ATIVAN) 0.5 MG tablet    Sig: Take 1 tablet (0.5 mg total) by mouth 2 (two) times daily as needed for anxiety.    Dispense:  30 tablet    Refill:  5  .  zolpidem (AMBIEN) 10 MG tablet    Sig: TAKE 1/2 TO 1 TABLET BY MOUTH AT BEDTIMEAS NEEDED FOR SLEEP    Dispense:  30 tablet    Refill:  5  . Testosterone 30 MG/ACT SOLN    Sig: APPLY 1 PUMP UNDER EACH ARM EVERY MORNING AS DIRECTED    Dispense:  180 mL    Refill:  1  . Dutasteride-Tamsulosin HCl (JALYN) 0.5-0.4 MG CAPS    Sig: Take 1 capsule by mouth at bedtime.    Dispense:  90 capsule    Refill:  1  . levothyroxine (SYNTHROID, LEVOTHROID) 100 MCG tablet    Sig: Take 1 tablet (100 mcg total) by mouth daily before breakfast.    Dispense:  90 tablet    Refill:  1  . atorvastatin (LIPITOR) 20 MG tablet    Sig: Take 1 tablet (20 mg total) by mouth daily at 6 PM.    Dispense:  90  tablet    Refill:  1  . pantoprazole (PROTONIX) 40 MG tablet    Sig: Take 1 tablet (40 mg total) by mouth 2 (two) times daily before a meal.    Dispense:  90 tablet    Refill:  1     Penni Homans, MD

## 2017-06-06 ENCOUNTER — Other Ambulatory Visit: Payer: Self-pay | Admitting: Family Medicine

## 2017-06-06 LAB — LIPID PANEL
CHOLESTEROL: 107 mg/dL (ref 0–200)
HDL: 64.3 mg/dL (ref >39.00)
LDL CALC: 32 mg/dL (ref 0–99)
NONHDL: 43.1
Total CHOL/HDL Ratio: 2
Triglycerides: 55 mg/dL (ref 0.0–149.0)
VLDL: 11 mg/dL (ref 0.0–40.0)

## 2017-06-06 LAB — COMPREHENSIVE METABOLIC PANEL
ALBUMIN: 4 g/dL (ref 3.5–5.2)
ALK PHOS: 58 U/L (ref 39–117)
ALT: 16 U/L (ref 0–53)
AST: 20 U/L (ref 0–37)
BUN: 14 mg/dL (ref 6–23)
CHLORIDE: 101 meq/L (ref 96–112)
CO2: 30 mEq/L (ref 19–32)
CREATININE: 0.83 mg/dL (ref 0.40–1.50)
Calcium: 9 mg/dL (ref 8.4–10.5)
GFR: 95.36 mL/min (ref >60.00)
GLUCOSE: 77 mg/dL (ref 70–99)
POTASSIUM: 4.2 meq/L (ref 3.5–5.1)
SODIUM: 136 meq/L (ref 135–145)
TOTAL PROTEIN: 6.9 g/dL (ref 6.0–8.3)
Total Bilirubin: 0.7 mg/dL (ref 0.2–1.2)

## 2017-06-06 LAB — TSH: TSH: 0.53 u[IU]/mL (ref 0.35–4.50)

## 2017-06-06 LAB — CBC
HCT: 40.6 % (ref 39.0–52.0)
Hemoglobin: 13.8 g/dL (ref 13.0–17.0)
MCHC: 34 g/dL (ref 30.0–36.0)
MCV: 95.6 fl (ref 78.0–100.0)
Platelets: 235 10*3/uL (ref 150.0–400.0)
RBC: 4.25 Mil/uL (ref 4.22–5.81)
RDW: 13.8 % (ref 11.5–15.5)
WBC: 6.9 10*3/uL (ref 4.0–10.5)

## 2017-06-06 LAB — TESTOSTERONE: TESTOSTERONE: 608.44 ng/dL (ref 300.00–890.00)

## 2017-06-21 DIAGNOSIS — J019 Acute sinusitis, unspecified: Secondary | ICD-10-CM | POA: Diagnosis not present

## 2017-07-14 ENCOUNTER — Other Ambulatory Visit: Payer: Self-pay | Admitting: Family Medicine

## 2017-08-16 ENCOUNTER — Telehealth: Payer: Self-pay

## 2017-08-16 NOTE — Telephone Encounter (Signed)
PA approved. Effective 07/17/2017 through 08/16/2018.

## 2017-08-16 NOTE — Telephone Encounter (Signed)
PA initiated via Covermymeds; KEY: [id number]. Awaiting determination.

## 2017-09-06 DIAGNOSIS — R142 Eructation: Secondary | ICD-10-CM | POA: Diagnosis not present

## 2017-09-06 DIAGNOSIS — K219 Gastro-esophageal reflux disease without esophagitis: Secondary | ICD-10-CM | POA: Diagnosis not present

## 2017-09-06 DIAGNOSIS — R194 Change in bowel habit: Secondary | ICD-10-CM | POA: Diagnosis not present

## 2017-09-06 DIAGNOSIS — R1033 Periumbilical pain: Secondary | ICD-10-CM | POA: Diagnosis not present

## 2017-09-12 DIAGNOSIS — K209 Esophagitis, unspecified: Secondary | ICD-10-CM | POA: Diagnosis not present

## 2017-09-12 DIAGNOSIS — D120 Benign neoplasm of cecum: Secondary | ICD-10-CM | POA: Diagnosis not present

## 2017-09-12 DIAGNOSIS — Z800 Family history of malignant neoplasm of digestive organs: Secondary | ICD-10-CM | POA: Diagnosis not present

## 2017-09-12 DIAGNOSIS — R1013 Epigastric pain: Secondary | ICD-10-CM | POA: Diagnosis not present

## 2017-09-12 DIAGNOSIS — K5730 Diverticulosis of large intestine without perforation or abscess without bleeding: Secondary | ICD-10-CM | POA: Diagnosis not present

## 2017-09-12 DIAGNOSIS — K319 Disease of stomach and duodenum, unspecified: Secondary | ICD-10-CM | POA: Diagnosis not present

## 2017-09-12 DIAGNOSIS — K228 Other specified diseases of esophagus: Secondary | ICD-10-CM | POA: Diagnosis not present

## 2017-09-12 DIAGNOSIS — Z86010 Personal history of colonic polyps: Secondary | ICD-10-CM | POA: Diagnosis not present

## 2017-09-12 DIAGNOSIS — D123 Benign neoplasm of transverse colon: Secondary | ICD-10-CM | POA: Diagnosis not present

## 2017-09-12 DIAGNOSIS — K635 Polyp of colon: Secondary | ICD-10-CM | POA: Diagnosis not present

## 2017-09-12 LAB — HM COLONOSCOPY

## 2017-09-14 ENCOUNTER — Encounter: Payer: Self-pay | Admitting: Family Medicine

## 2017-09-20 ENCOUNTER — Other Ambulatory Visit: Payer: Self-pay | Admitting: Family Medicine

## 2017-10-02 DIAGNOSIS — R142 Eructation: Secondary | ICD-10-CM | POA: Diagnosis not present

## 2017-10-02 DIAGNOSIS — F419 Anxiety disorder, unspecified: Secondary | ICD-10-CM | POA: Diagnosis not present

## 2017-10-02 DIAGNOSIS — K219 Gastro-esophageal reflux disease without esophagitis: Secondary | ICD-10-CM | POA: Diagnosis not present

## 2017-10-04 ENCOUNTER — Other Ambulatory Visit: Payer: Self-pay | Admitting: Family Medicine

## 2017-10-04 ENCOUNTER — Telehealth: Payer: Self-pay

## 2017-10-04 DIAGNOSIS — I10 Essential (primary) hypertension: Secondary | ICD-10-CM

## 2017-10-04 DIAGNOSIS — E785 Hyperlipidemia, unspecified: Secondary | ICD-10-CM

## 2017-10-04 DIAGNOSIS — R7989 Other specified abnormal findings of blood chemistry: Secondary | ICD-10-CM

## 2017-10-04 NOTE — Telephone Encounter (Signed)
PA initiated via Covermymeds; KEY: [id number]. Awaiting determination.

## 2017-10-17 NOTE — Telephone Encounter (Signed)
He beeds tsh, cmp, cbc, and testosterone for low testosterone and HTN

## 2017-10-17 NOTE — Telephone Encounter (Signed)
Spoke with patient and let him know that his orders have been placed. His insurance company is stating he has not had a low testosterone and they do not know why he is on testosterone. I let him about this and he voices his understanding

## 2017-10-17 NOTE — Telephone Encounter (Signed)
Patient is calling to check on the status of this. He states [employer] is waiting on the doctor office. Patient has a direct number for [place] to call. [phone]. Patient spoke with Vicente Males. Please advise as patient would like a status of this medication.

## 2017-10-17 NOTE — Addendum Note (Signed)
Addended by: Magdalene Molly A on: 10/17/2017 04:32 PM   Modules accepted: Orders

## 2017-10-17 NOTE — Telephone Encounter (Signed)
Was looking at Pts labs- he has not had a low testosterone. Insurance will not cover.

## 2017-10-18 ENCOUNTER — Other Ambulatory Visit: Payer: Medicare Other

## 2017-10-18 ENCOUNTER — Other Ambulatory Visit (INDEPENDENT_AMBULATORY_CARE_PROVIDER_SITE_OTHER): Payer: Medicare Other

## 2017-10-18 DIAGNOSIS — I10 Essential (primary) hypertension: Secondary | ICD-10-CM

## 2017-10-18 DIAGNOSIS — R7989 Other specified abnormal findings of blood chemistry: Secondary | ICD-10-CM

## 2017-10-18 LAB — COMPREHENSIVE METABOLIC PANEL
ALT: 15 U/L (ref 0–53)
AST: 20 U/L (ref 0–37)
Albumin: 3.9 g/dL (ref 3.5–5.2)
Alkaline Phosphatase: 61 U/L (ref 39–117)
BUN: 9 mg/dL (ref 6–23)
CO2: 30 mEq/L (ref 19–32)
Calcium: 9.4 mg/dL (ref 8.4–10.5)
Chloride: 100 mEq/L (ref 96–112)
Creatinine, Ser: 0.89 mg/dL (ref 0.40–1.50)
GFR: 87.9 mL/min (ref >60.00)
GLUCOSE: 111 mg/dL — AB (ref 70–99)
POTASSIUM: 4.7 meq/L (ref 3.5–5.1)
Sodium: 136 mEq/L (ref 135–145)
Total Bilirubin: 0.7 mg/dL (ref 0.2–1.2)
Total Protein: 6.9 g/dL (ref 6.0–8.3)

## 2017-10-18 LAB — CBC
HEMATOCRIT: 42.7 % (ref 39.0–52.0)
HEMOGLOBIN: 14.9 g/dL (ref 13.0–17.0)
MCHC: 34.8 g/dL (ref 30.0–36.0)
MCV: 94.3 fl (ref 78.0–100.0)
Platelets: 240 10*3/uL (ref 150.0–400.0)
RBC: 4.53 Mil/uL (ref 4.22–5.81)
RDW: 12.7 % (ref 11.5–15.5)
WBC: 6.6 10*3/uL (ref 4.0–10.5)

## 2017-10-18 LAB — TESTOSTERONE: Testosterone: 304.51 ng/dL (ref 300.00–890.00)

## 2017-10-18 LAB — TSH: TSH: 1.05 u[IU]/mL (ref 0.35–4.50)

## 2017-10-30 DIAGNOSIS — L570 Actinic keratosis: Secondary | ICD-10-CM | POA: Diagnosis not present

## 2017-10-30 DIAGNOSIS — Z08 Encounter for follow-up examination after completed treatment for malignant neoplasm: Secondary | ICD-10-CM | POA: Diagnosis not present

## 2017-10-30 DIAGNOSIS — Z808 Family history of malignant neoplasm of other organs or systems: Secondary | ICD-10-CM | POA: Diagnosis not present

## 2017-10-30 DIAGNOSIS — Z85828 Personal history of other malignant neoplasm of skin: Secondary | ICD-10-CM | POA: Diagnosis not present

## 2017-10-30 DIAGNOSIS — L304 Erythema intertrigo: Secondary | ICD-10-CM | POA: Diagnosis not present

## 2017-12-06 ENCOUNTER — Other Ambulatory Visit: Payer: Self-pay | Admitting: Family Medicine

## 2017-12-07 NOTE — Telephone Encounter (Signed)
He needs UDS, contract and visit. OK to give him a 30 day supply to give him time to get in. Have approved but no more til completed

## 2017-12-07 NOTE — Telephone Encounter (Signed)
Requesting: AMBIEN Contract:11/24/16 UDS:01/14/17 Low risk Last OV:06/05/17 Next OV:- Last Refill:06/05/17   Please advise

## 2018-01-09 ENCOUNTER — Ambulatory Visit (INDEPENDENT_AMBULATORY_CARE_PROVIDER_SITE_OTHER): Payer: Medicare Other | Admitting: Family Medicine

## 2018-01-09 DIAGNOSIS — E785 Hyperlipidemia, unspecified: Secondary | ICD-10-CM | POA: Diagnosis not present

## 2018-01-09 DIAGNOSIS — I2583 Coronary atherosclerosis due to lipid rich plaque: Secondary | ICD-10-CM | POA: Diagnosis not present

## 2018-01-09 DIAGNOSIS — I10 Essential (primary) hypertension: Secondary | ICD-10-CM | POA: Diagnosis not present

## 2018-01-09 DIAGNOSIS — K219 Gastro-esophageal reflux disease without esophagitis: Secondary | ICD-10-CM | POA: Diagnosis not present

## 2018-01-09 DIAGNOSIS — N529 Male erectile dysfunction, unspecified: Secondary | ICD-10-CM

## 2018-01-09 DIAGNOSIS — I2510 Atherosclerotic heart disease of native coronary artery without angina pectoris: Secondary | ICD-10-CM

## 2018-01-09 DIAGNOSIS — N400 Enlarged prostate without lower urinary tract symptoms: Secondary | ICD-10-CM | POA: Diagnosis not present

## 2018-01-09 DIAGNOSIS — Z79899 Other long term (current) drug therapy: Secondary | ICD-10-CM | POA: Diagnosis not present

## 2018-01-09 DIAGNOSIS — R7989 Other specified abnormal findings of blood chemistry: Secondary | ICD-10-CM | POA: Diagnosis not present

## 2018-01-09 DIAGNOSIS — G4700 Insomnia, unspecified: Secondary | ICD-10-CM | POA: Diagnosis not present

## 2018-01-09 DIAGNOSIS — E039 Hypothyroidism, unspecified: Secondary | ICD-10-CM

## 2018-01-09 MED ORDER — ATORVASTATIN CALCIUM 20 MG PO TABS: ORAL_TABLET | ORAL | 1 refills | Status: DC

## 2018-01-09 MED ORDER — LEVOTHYROXINE SODIUM 100 MCG PO TABS: ORAL_TABLET | ORAL | 1 refills | Status: DC

## 2018-01-09 MED ORDER — MIRTAZAPINE 15 MG PO TABS: 15.0000 mg | ORAL_TABLET | Freq: Every day | ORAL | 2 refills | Status: DC

## 2018-01-09 MED ORDER — ZOLPIDEM TARTRATE 10 MG PO TABS: 10.0000 mg | ORAL_TABLET | Freq: Every day | ORAL | 1 refills | Status: DC

## 2018-01-09 MED ORDER — DUTASTERIDE-TAMSULOSIN HCL 0.5-0.4 MG PO CAPS: 1.0000 | ORAL_CAPSULE | Freq: Every day | ORAL | 1 refills | Status: DC

## 2018-01-09 MED ORDER — LORAZEPAM 0.5 MG PO TABS: 0.5000 mg | ORAL_TABLET | Freq: Two times a day (BID) | ORAL | 5 refills | Status: DC | PRN

## 2018-01-09 MED ORDER — TESTOSTERONE 30 MG/ACT TD SOLN: TRANSDERMAL | 1 refills | Status: DC

## 2018-01-09 NOTE — Patient Instructions (Addendum)
Consider Mucinex pain twice a day Zyrtec/Cetirizine 10 mg daily  Shingrix is the new shingles shot 2 shots over 2-6 months at phamacy Hypertension Hypertension, commonly called high blood pressure, is when the force of blood pumping through the arteries is too strong. The arteries are the blood vessels that carry blood from the heart throughout the body. Hypertension forces the heart to work harder to pump blood and may cause arteries to become narrow or stiff. Having untreated or uncontrolled hypertension can cause heart attacks, strokes, kidney disease, and other problems. A blood pressure reading consists of a higher number over a lower number. Ideally, your blood pressure should be below 120/80. The first ("top") number is called the systolic pressure. It is a measure of the pressure in your arteries as your heart beats. The second ("bottom") number is called the diastolic pressure. It is a measure of the pressure in your arteries as the heart relaxes. What are the causes? The cause of this condition is not known. What increases the risk? Some risk factors for high blood pressure are under your control. Others are not. Factors you can change  Smoking.  Having type 2 diabetes mellitus, high cholesterol, or both.  Not getting enough exercise or physical activity.  Being overweight.  Having too much fat, sugar, calories, or salt (sodium) in your diet.  Drinking too much alcohol. Factors that are difficult or impossible to change  Having chronic kidney disease.  Having a family history of high blood pressure.  Age. Risk increases with age.  Race. You may be at higher risk if you are African-American.  Gender. Men are at higher risk than women before age 52. After age 47, women are at higher risk than men.  Having obstructive sleep apnea.  Stress. What are the signs or symptoms? Extremely high blood pressure (hypertensive crisis) may cause:  Headache.  Anxiety.  Shortness  of breath.  Nosebleed.  Nausea and vomiting.  Severe chest pain.  Jerky movements you cannot control (seizures).  How is this diagnosed? This condition is diagnosed by measuring your blood pressure while you are seated, with your arm resting on a surface. The cuff of the blood pressure monitor will be placed directly against the skin of your upper arm at the level of your heart. It should be measured at least twice using the same arm. Certain conditions can cause a difference in blood pressure between your right and left arms. Certain factors can cause blood pressure readings to be lower or higher than normal (elevated) for a short period of time:  When your blood pressure is higher when you are in a health care provider's office than when you are at home, this is called white coat hypertension. Most people with this condition do not need medicines.  When your blood pressure is higher at home than when you are in a health care provider's office, this is called masked hypertension. Most people with this condition may need medicines to control blood pressure.  If you have a high blood pressure reading during one visit or you have normal blood pressure with other risk factors:  You may be asked to return on a different day to have your blood pressure checked again.  You may be asked to monitor your blood pressure at home for 1 week or longer.  If you are diagnosed with hypertension, you may have other blood or imaging tests to help your health care provider understand your overall risk for other conditions. How is  this treated? This condition is treated by making healthy lifestyle changes, such as eating healthy foods, exercising more, and reducing your alcohol intake. Your health care provider may prescribe medicine if lifestyle changes are not enough to get your blood pressure under control, and if:  Your systolic blood pressure is above 130.  Your diastolic blood pressure is above  80.  Your personal target blood pressure may vary depending on your medical conditions, your age, and other factors. Follow these instructions at home: Eating and drinking  Eat a diet that is high in fiber and potassium, and low in sodium, added sugar, and fat. An example eating plan is called the DASH (Dietary Approaches to Stop Hypertension) diet. To eat this way: ? Eat plenty of fresh fruits and vegetables. Try to fill half of your plate at each meal with fruits and vegetables. ? Eat whole grains, such as whole wheat pasta, brown rice, or whole grain bread. Fill about one quarter of your plate with whole grains. ? Eat or drink low-fat dairy products, such as skim milk or low-fat yogurt. ? Avoid fatty cuts of meat, processed or cured meats, and poultry with skin. Fill about one quarter of your plate with lean proteins, such as fish, chicken without skin, beans, eggs, and tofu. ? Avoid premade and processed foods. These tend to be higher in sodium, added sugar, and fat.  Reduce your daily sodium intake. Most people with hypertension should eat less than 1,500 mg of sodium a day.  Limit alcohol intake to no more than 1 drink a day for nonpregnant women and 2 drinks a day for men. One drink equals 12 oz of beer, 5 oz of wine, or 1 oz of hard liquor. Lifestyle  Work with your health care provider to maintain a healthy body weight or to lose weight. Ask what an ideal weight is for you.  Get at least 30 minutes of exercise that causes your heart to beat faster (aerobic exercise) most days of the week. Activities may include walking, swimming, or biking.  Include exercise to strengthen your muscles (resistance exercise), such as pilates or lifting weights, as part of your weekly exercise routine. Try to do these types of exercises for 30 minutes at least 3 days a week.  Do not use any products that contain nicotine or tobacco, such as cigarettes and e-cigarettes. If you need help quitting, ask  your health care provider.  Monitor your blood pressure at home as told by your health care provider.  Keep all follow-up visits as told by your health care provider. This is important. Medicines  Take over-the-counter and prescription medicines only as told by your health care provider. Follow directions carefully. Blood pressure medicines must be taken as prescribed.  Do not skip doses of blood pressure medicine. Doing this puts you at risk for problems and can make the medicine less effective.  Ask your health care provider about side effects or reactions to medicines that you should watch for. Contact a health care provider if:  You think you are having a reaction to a medicine you are taking.  You have headaches that keep coming back (recurring).  You feel dizzy.  You have swelling in your ankles.  You have trouble with your vision. Get help right away if:  You develop a severe headache or confusion.  You have unusual weakness or numbness.  You feel faint.  You have severe pain in your chest or abdomen.  You vomit repeatedly.  You  have trouble breathing. Summary  Hypertension is when the force of blood pumping through your arteries is too strong. If this condition is not controlled, it may put you at risk for serious complications.  Your personal target blood pressure may vary depending on your medical conditions, your age, and other factors. For most people, a normal blood pressure is less than 120/80.  Hypertension is treated with lifestyle changes, medicines, or a combination of both. Lifestyle changes include weight loss, eating a healthy, low-sodium diet, exercising more, and limiting alcohol. This information is not intended to replace advice given to you by your health care provider. Make sure you discuss any questions you have with your health care provider. Document Released: 06/06/2005 Document Revised: 05/04/2016 Document Reviewed: 05/04/2016 Elsevier  Interactive Patient Education  [employer].

## 2018-01-10 LAB — CBC
HCT: 41.3 % (ref 39.0–52.0)
Hemoglobin: 14 g/dL (ref 13.0–17.0)
MCHC: 33.9 g/dL (ref 30.0–36.0)
MCV: 94.3 fl (ref 78.0–100.0)
Platelets: 219 10*3/uL (ref 150.0–400.0)
RBC: 4.37 Mil/uL (ref 4.22–5.81)
RDW: 13.3 % (ref 11.5–15.5)
WBC: 7.3 10*3/uL (ref 4.0–10.5)

## 2018-01-10 LAB — PSA: PSA: 0.47 ng/mL (ref 0.10–4.00)

## 2018-01-10 LAB — COMPREHENSIVE METABOLIC PANEL
ALK PHOS: 62 U/L (ref 39–117)
ALT: 16 U/L (ref 0–53)
AST: 16 U/L (ref 0–37)
Albumin: 4.2 g/dL (ref 3.5–5.2)
BILIRUBIN TOTAL: 0.6 mg/dL (ref 0.2–1.2)
BUN: 13 mg/dL (ref 6–23)
CO2: 31 meq/L (ref 19–32)
Calcium: 9.6 mg/dL (ref 8.4–10.5)
Chloride: 101 mEq/L (ref 96–112)
Creatinine, Ser: 0.88 mg/dL (ref 0.40–1.50)
GFR: 89 mL/min (ref >60.00)
Glucose, Bld: 92 mg/dL (ref 70–99)
Potassium: 5.2 mEq/L — ABNORMAL HIGH (ref 3.5–5.1)
Sodium: 138 mEq/L (ref 135–145)
TOTAL PROTEIN: 7.2 g/dL (ref 6.0–8.3)

## 2018-01-10 LAB — TSH: TSH: 1.35 u[IU]/mL (ref 0.35–4.50)

## 2018-01-10 LAB — LIPID PANEL
CHOL/HDL RATIO: 2
Cholesterol: 116 mg/dL (ref 0–200)
HDL: 59.8 mg/dL (ref >39.00)
LDL CALC: 45 mg/dL (ref 0–99)
NonHDL: 56.18
TRIGLYCERIDES: 58 mg/dL (ref 0.0–149.0)
VLDL: 11.6 mg/dL (ref 0.0–40.0)

## 2018-01-10 LAB — TESTOSTERONE: Testosterone: 259.76 ng/dL — ABNORMAL LOW (ref 300.00–890.00)

## 2018-01-13 LAB — PAIN MGMT, PROFILE 8 W/CONF, U
6 Acetylmorphine: NEGATIVE ng/mL (ref <10)
ALCOHOL METABOLITES: POSITIVE ng/mL — AB (ref <500)
AMPHETAMINES: NEGATIVE ng/mL (ref <500)
Benzodiazepines: NEGATIVE ng/mL (ref <100)
Buprenorphine, Urine: NEGATIVE ng/mL (ref <5)
Cocaine Metabolite: NEGATIVE ng/mL (ref <150)
Creatinine: 32.9 mg/dL
ETHYL GLUCURONIDE (ETG): 1705 ng/mL — AB (ref <500)
ETHYL SULFATE (ETS): 794 ng/mL — AB (ref <100)
MARIJUANA METABOLITE: NEGATIVE ng/mL (ref <20)
MDMA: NEGATIVE ng/mL (ref <500)
OPIATES: NEGATIVE ng/mL (ref <100)
OXYCODONE: NEGATIVE ng/mL (ref <100)
Oxidant: NEGATIVE ug/mL (ref <200)
pH: 6.76 (ref 4.5–9.0)

## 2018-01-14 NOTE — Assessment & Plan Note (Signed)
Tolerating statin, encouraged heart healthy diet, avoid trans fats, minimize simple carbs and saturated fats. Increase exercise as tolerated 

## 2018-01-14 NOTE — Assessment & Plan Note (Signed)
On Levothyroxine, continue to monitor 

## 2018-01-14 NOTE — Assessment & Plan Note (Signed)
Encouraged good sleep hygiene such as dark, quiet room. No blue/green glowing lights such as computer screens in bedroom. No alcohol or stimulants in evening. Cut down on caffeine as able. Regular exercise is helpful but not just prior to bed time. May continue zolpidem prn, UDS and contract utd

## 2018-01-14 NOTE — Assessment & Plan Note (Signed)
Level mildly suppressed. Due to risk of complications from treatment will not supplement at the present times

## 2018-01-14 NOTE — Progress Notes (Signed)
Subjective:    Patient ID: James [name], male    DOB: 11-19-1940, 77 y.o.   MRN: [id number]  No chief complaint on file.   HPI Patient is in today for follow up. He feels well today. No recent febrile illness or hospitalizations. His only concern is a sense of globus at times. No difficulty with sore throat or trouble swallowing. No significant reflux symptoms. Denies CP/palp/SOB/HA/congestion/fevers/GI or GU c/o. Taking meds as prescribed  ouble swallowing.   Past Medical History:  Diagnosis Date  . AAA (abdominal aortic aneurysm) without rupture ([place]) 09/08/2013  . Abdominal bloating 05/19/2016  . Anxiety state 05/18/2014  . Atrial fibrillation ([place])   . 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 Benign paroxysmal positional vertigo 04/06/2013  . CAD (coronary artery disease)    nonobstructive by cath 2008; nonobstructive by Cardiac CT 11/2011  . Cervical spine fracture ([facility]) 1980   C6-7  . Diverticulosis   . Erectile dysfunction 09/08/2013  . GERD (gastroesophageal reflux disease)   . Hiatal hernia   . History of atrial fibrillation   . History of squamous cell carcinoma 10-05-10   Dr [name], [place]  . HLD (hyperlipidemia)   . Hypothyroidism   . Medicare annual wellness visit, subsequent 03/16/2014   Sees [facility] Follows with Dr Johnsie Cancel of cardiology Sees [place] for colonoscopy, last colonoscopy in 2013   . Mitral valve prolapse   . Neck pain 05/03/2015  . Preventative health care 03/16/2014  . 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 Syncope 02/03/2013  . 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    Past Surgical History:  Procedure Laterality Date  . APPENDECTOMY  05/2011  . CARDIAC CATHETERIZATION  01/03/12   30% ostial LAD stenosis, ostial 30-40% D1 stenosis, smooth and normal left main and RCA; LVEF 65%  . CHOLECYSTECTOMY N/A 08/25/2014   Procedure: LAPAROSCOPIC CHOLECYSTECTOMY WITH INTRAOPERATIVE CHOLANGIOGRAM;  Surgeon:  [name], MD;  Location: [facility];  Service: General;  Laterality: N/A;  . INGUINAL HERNIA REPAIR  07/2010   right  . KNEE ARTHROPLASTY     bilat  . LEFT HEART CATHETERIZATION WITH CORONARY ANGIOGRAM N/A 01/03/2012   Procedure: LEFT HEART CATHETERIZATION WITH CORONARY ANGIOGRAM;  Surgeon: [name], MD;  Location: [facility] CATH LAB;  Service: Cardiovascular;  Laterality: N/A;  . MRI  10/31/2016  . TONSILLECTOMY  1947  . tumor removal  1958   "fatty tumor"    Family History  Problem Relation Age of Onset  . Heart disease Mother        MI 13s  . Stroke Mother   . Hypertension Mother   . Heart disease Maternal Grandfather        MI 70-80s  . Heart disease Maternal Grandmother        MI 70-80s  . Lung disease Father        penumonitis  . Cancer Sister        colon  . Cancer Paternal Uncle        GI: colon he thinks    Social History   Socioeconomic History  . Marital status: Single    Spouse name: Not on file  . Number of children: Not on file  . Years of education: Not on file  . Highest education level: Not on file  Occupational History  . 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 Financial resource strain: Not on file  .  Food insecurity:    Worry: Not on file    Inability: Not on file  . Transportation needs:    Medical: Not on file    Non-medical: Not on file  Tobacco Use  . Smoking status: Never Smoker  . Smokeless tobacco: Never Used  Substance and Sexual Activity  . Alcohol use: Yes    Comment: 2 glasses red wine/day  . Drug use: No  . Sexual activity: Yes  Lifestyle  . Physical activity:    Days per week: Not on file    Minutes per session: Not on file  . Stress: Not on file  Relationships  . Social connections:    Talks on phone: Not on file    Gets together: Not on file    Attends religious service: Not on file    Active member of club or organization: Not on file    Attends meetings of clubs or organizations: Not on file     Relationship status: Not on file  . Intimate partner violence:    Fear of current or ex partner: Not on file    Emotionally abused: Not on file    Physically abused: Not on file    Forced sexual activity: Not on file  Other Topics Concern  . Not on file  Social History Narrative  . Not on file    Outpatient Medications Prior to Visit  Medication Sig Dispense Refill  . aspirin EC 81 MG tablet Take 81 mg by mouth daily.    . Calcium Carb-Cholecalciferol (CALCIUM 1000 + D PO) Take 1 tablet by mouth daily.    . fluticasone (FLONASE) 50 MCG/ACT nasal spray Place 2 sprays into both nostrils daily. 16 g 1  . meclizine (ANTIVERT) 12.5 MG tablet 1-2 tab po tid prn dizziness 30 tablet 1  . Milk Thistle 1000 MG CAPS Take 1 capsule by mouth daily.    . Multiple Vitamins-Minerals (MULTIVITAMINS THER. W/MINERALS) TABS Take 1 tablet by mouth daily.    . pantoprazole (PROTONIX) 40 MG tablet Take 1 tablet (40 mg total) by mouth 2 (two) times daily before a meal. 90 tablet 1  . Probiotic Product (PROBIOTIC DAILY PO) Take by mouth.    . sildenafil (REVATIO) 20 MG tablet TAKE 1 TO 5 TABLETS BY MOUTH DAILY AS NEEDED 35 tablet 1  . atorvastatin (LIPITOR) 20 MG tablet TAKE 1 TABLET BY MOUTH EVERY DAY AT 6PM FOR CHOLESTEROL 90 tablet 1  . Dutasteride-Tamsulosin HCl (JALYN) 0.5-0.4 MG CAPS Take 1 capsule by mouth at bedtime. 90 capsule 1  . levothyroxine (SYNTHROID, LEVOTHROID) 100 MCG tablet TAKE 1 TABLET BY MOUTH EVERY MORNING BEFORE BREAKFAST 90 tablet 1  . LORazepam (ATIVAN) 0.5 MG tablet Take 1 tablet (0.5 mg total) by mouth 2 (two) times daily as needed for anxiety. 30 tablet 5  . mirtazapine (REMERON) 15 MG tablet TAKE 1 TABLET BY MOUTH EVERY DAY 90 tablet 2  . sildenafil (REVATIO) 20 MG tablet TAKE 1 TO 5 TABLETS BY MOUTH DAILY AS NEEDED 35 tablet 0  . Testosterone 30 MG/ACT SOLN APPLY 1 PUMP UNDER EACH ARM EVERY MORNING AS DIRECTED 180 mL 1  . zolpidem (AMBIEN) 10 MG tablet TAKE 1/2 TO 1 TABLET BY  MOUTH AT BEDTIMEAS NEEDED FOR SLEEP 30 tablet 0  . nitroGLYCERIN (NITROSTAT) 0.4 MG SL tablet Place 1 tablet (0.4 mg total) under the tongue every 5 (five) minutes x 3 doses as needed for chest pain. (Patient not taking: Reported on 08/24/2016) 25 tablet  3   No facility-administered medications prior to visit.     Allergies  Allergen Reactions  . Sulfa Antibiotics Other (See Comments)    Childhood allergy    Review of Systems  Constitutional: Negative for fever and malaise/fatigue.  HENT: Negative for congestion.   Eyes: Negative for blurred vision.  Respiratory: Negative for shortness of breath.   Cardiovascular: Negative for chest pain, palpitations and leg swelling.  Gastrointestinal: Negative for abdominal pain, blood in stool and nausea.  Genitourinary: Negative for dysuria and frequency.  Musculoskeletal: Negative for falls.  Skin: Negative for rash.  Neurological: Negative for dizziness, loss of consciousness and headaches.  Endo/Heme/Allergies: Negative for environmental allergies.  Psychiatric/Behavioral: Negative for depression. The patient has insomnia. The patient is not nervous/anxious.        Objective:    Physical Exam  Constitutional: He is oriented to person, place, and time. He appears well-developed and well-nourished. No distress.  HENT:  Head: Normocephalic and atraumatic.  Nose: Nose normal.  Eyes: Right eye exhibits no discharge. Left eye exhibits no discharge.  Neck: Normal range of motion. Neck supple.  Cardiovascular: Normal rate and regular rhythm.  No murmur heard. Pulmonary/Chest: Effort normal and breath sounds normal.  Abdominal: Soft. Bowel sounds are normal. There is no tenderness.  Musculoskeletal: He exhibits no edema.  Neurological: He is alert and oriented to person, place, and time.  Skin: Skin is warm and dry.  Psychiatric: He has a normal mood and affect.  Nursing note and vitals reviewed.   There were no vitals taken for this  visit. Wt Readings from Last 3 Encounters:  06/05/17 157 lb 6.4 oz (71.4 kg)  04/19/17 152 lb 3.2 oz (69 kg)  03/20/17 154 lb (69.9 kg)     Lab Results  Component Value Date   WBC 7.3 01/09/2018   HGB 14.0 01/09/2018   HCT 41.3 01/09/2018   PLT 219.0 01/09/2018   GLUCOSE 92 01/09/2018   CHOL 116 01/09/2018   TRIG 58.0 01/09/2018   HDL 59.80 01/09/2018   LDLCALC 45 01/09/2018   ALT 16 01/09/2018   AST 16 01/09/2018   NA 138 01/09/2018   K 5.2 (H) 01/09/2018   CL 101 01/09/2018   CREATININE 0.88 01/09/2018   BUN 13 01/09/2018   CO2 31 01/09/2018   TSH 1.35 01/09/2018   PSA 0.47 01/09/2018   INR 1.09 08/21/2014    Lab Results  Component Value Date   TSH 1.35 01/09/2018   Lab Results  Component Value Date   WBC 7.3 01/09/2018   HGB 14.0 01/09/2018   HCT 41.3 01/09/2018   MCV 94.3 01/09/2018   PLT 219.0 01/09/2018   Lab Results  Component Value Date   NA 138 01/09/2018   K 5.2 (H) 01/09/2018   CO2 31 01/09/2018   GLUCOSE 92 01/09/2018   BUN 13 01/09/2018   CREATININE 0.88 01/09/2018   BILITOT 0.6 01/09/2018   ALKPHOS 62 01/09/2018   AST 16 01/09/2018   ALT 16 01/09/2018   PROT 7.2 01/09/2018   ALBUMIN 4.2 01/09/2018   CALCIUM 9.6 01/09/2018   ANIONGAP 6 08/21/2014   GFR 89.00 01/09/2018   Lab Results  Component Value Date   CHOL 116 01/09/2018   Lab Results  Component Value Date   HDL 59.80 01/09/2018   Lab Results  Component Value Date   LDLCALC 45 01/09/2018   Lab Results  Component Value Date   TRIG 58.0 01/09/2018   Lab Results  Component Value  Date   CHOLHDL 2 01/09/2018   No results found for: HGBA1C     Assessment & Plan:   Problem List Items Addressed This Visit    Hyperlipidemia    Tolerating statin, encouraged heart healthy diet, avoid trans fats, minimize simple carbs and saturated fats. Increase exercise as tolerated      Relevant Medications   atorvastatin (LIPITOR) 20 MG tablet   Other Relevant Orders   Lipid  panel (Completed)   CAD (coronary artery disease)   Relevant Medications   atorvastatin (LIPITOR) 20 MG tablet   Hypothyroidism    On Levothyroxine, continue to monitor      Relevant Medications   levothyroxine (SYNTHROID, LEVOTHROID) 100 MCG tablet   GERD (gastroesophageal reflux disease)    Avoid offending foods, start probiotics. Do not eat large meals in late evening and consider raising head of bed. He denies any reflux symptoms but has a sense of globus. Consider trying bid dosing of Zyrtec and zantac to see if that is helpful. If not and if symptoms persist or worsen let us know so we can investigate further.       Erectile dysfunction   Relevant Orders   PSA (Completed)   Low testosterone    Level mildly suppressed. Due to risk of complications from treatment will not supplement at the present times      Relevant Orders   PSA (Completed)   Insomnia    Encouraged good sleep hygiene such as dark, quiet room. No blue/green glowing lights such as computer screens in bedroom. No alcohol or stimulants in evening. Cut down on caffeine as able. Regular exercise is helpful but not just prior to bed time. May continue zolpidem prn, UDS and contract utd      Essential hypertension    Well controlled, no changes to meds. Encouraged heart healthy diet such as the DASH diet and exercise as tolerated.       Relevant Medications   atorvastatin (LIPITOR) 20 MG tablet   Testosterone 30 MG/ACT SOLN   Other Relevant Orders   TSH (Completed)   CBC (Completed)   Comprehensive metabolic panel (Completed)   Testosterone (Completed)    Other Visit Diagnoses    High risk medication use    -  Primary   Relevant Orders   Pain Mgmt, Profile 8 w/Conf, U (Completed)   Benign prostatic hyperplasia, unspecified whether lower urinary tract symptoms present       Relevant Medications   Dutasteride-Tamsulosin HCl (JALYN) 0.5-0.4 MG CAPS   Other Relevant Orders   PSA (Completed)      I have  changed Lissa Merlin E. Munos's zolpidem and mirtazapine. I am also having him maintain his multivitamins ther. w/minerals, Calcium Carb-Cholecalciferol (CALCIUM 1000 + D PO), nitroGLYCERIN, Probiotic Product (PROBIOTIC DAILY PO), aspirin EC, Milk Thistle, fluticasone, meclizine, pantoprazole, sildenafil, levothyroxine, atorvastatin, LORazepam, Dutasteride-Tamsulosin HCl, and Testosterone.  Meds ordered this encounter  Medications  . levothyroxine (SYNTHROID, LEVOTHROID) 100 MCG tablet    Sig: TAKE 1 TABLET BY MOUTH EVERY MORNING BEFORE BREAKFAST    Dispense:  90 tablet    Refill:  1  . zolpidem (AMBIEN) 10 MG tablet    Sig: Take 1 tablet (10 mg total) by mouth at bedtime.    Dispense:  30 tablet    Refill:  1  . atorvastatin (LIPITOR) 20 MG tablet    Sig: TAKE 1 TABLET BY MOUTH EVERY DAY AT 6PM FOR CHOLESTEROL    Dispense:  90 tablet  Refill:  1  . mirtazapine (REMERON) 15 MG tablet    Sig: Take 1 tablet (15 mg total) by mouth daily.    Dispense:  90 tablet    Refill:  2  . LORazepam (ATIVAN) 0.5 MG tablet    Sig: Take 1 tablet (0.5 mg total) by mouth 2 (two) times daily as needed for anxiety.    Dispense:  30 tablet    Refill:  5  . Dutasteride-Tamsulosin HCl (JALYN) 0.5-0.4 MG CAPS    Sig: Take 1 capsule by mouth at bedtime.    Dispense:  90 capsule    Refill:  1  . Testosterone 30 MG/ACT SOLN    Sig: APPLY 1 PUMP UNDER EACH ARM EVERY MORNING AS DIRECTED    Dispense:  180 mL    Refill:  1     Penni Homans, MD

## 2018-01-14 NOTE — Assessment & Plan Note (Signed)
Well controlled, no changes to meds. Encouraged heart healthy diet such as the DASH diet and exercise as tolerated.  °

## 2018-01-14 NOTE — Assessment & Plan Note (Signed)
Avoid offending foods, start probiotics. Do not eat large meals in late evening and consider raising head of bed. He denies any reflux symptoms but has a sense of globus. Consider trying bid dosing of Zyrtec and zantac to see if that is helpful. If not and if symptoms persist or worsen let us know so we can investigate further.

## 2018-01-25 ENCOUNTER — Other Ambulatory Visit: Payer: Self-pay | Admitting: Family Medicine

## 2018-02-21 ENCOUNTER — Telehealth: Payer: Self-pay

## 2018-02-21 NOTE — Telephone Encounter (Signed)
PA initiated via Covermymeds; KEY: [id number]. PA approved.

## 2018-02-22 ENCOUNTER — Telehealth: Payer: Self-pay | Admitting: Family Medicine

## 2018-02-22 NOTE — Telephone Encounter (Signed)
Copied from [place] [id number]. Topic: Quick Communication - See Telephone Encounter >> Feb 22, 2018 10:40 AM Mylinda Latina, NT wrote: CRM for notification. See Telephone encounter for: 02/22/18. Patient called and states he needs a prior auth for Testosterone 30 MG/ACT SOLN. Please advise. Thank you . Please call when it is completed [phone]# [phone]

## 2018-02-22 NOTE — Telephone Encounter (Signed)
PA was approved yesterday.

## 2018-03-09 ENCOUNTER — Other Ambulatory Visit: Payer: Self-pay | Admitting: Family Medicine

## 2018-03-12 NOTE — Telephone Encounter (Signed)
Refill request for Ambien 10mg .   Last OV: 01/09/2018 Last Fill: 01/09/2018 #30 and 1RF UDS: 01/09/2018 Low to Moderate d/t alcohol

## 2018-03-19 DIAGNOSIS — Z23 Encounter for immunization: Secondary | ICD-10-CM | POA: Diagnosis not present

## 2018-03-23 ENCOUNTER — Other Ambulatory Visit: Payer: Self-pay | Admitting: Family Medicine

## 2018-04-10 ENCOUNTER — Other Ambulatory Visit: Payer: Self-pay | Admitting: Family Medicine

## 2018-04-11 NOTE — Telephone Encounter (Signed)
Pt is requesting refill on Ambien.   Last OV: 01/09/2018 Last Fill:  03/12/2018 #30 and 0RF UDS: 01/09/2018 Moderate risk

## 2018-04-20 DIAGNOSIS — M9901 Segmental and somatic dysfunction of cervical region: Secondary | ICD-10-CM | POA: Diagnosis not present

## 2018-04-20 DIAGNOSIS — M9904 Segmental and somatic dysfunction of sacral region: Secondary | ICD-10-CM | POA: Diagnosis not present

## 2018-04-20 DIAGNOSIS — S335XXA Sprain of ligaments of lumbar spine, initial encounter: Secondary | ICD-10-CM | POA: Diagnosis not present

## 2018-04-20 DIAGNOSIS — M9903 Segmental and somatic dysfunction of lumbar region: Secondary | ICD-10-CM | POA: Diagnosis not present

## 2018-04-24 DIAGNOSIS — M9904 Segmental and somatic dysfunction of sacral region: Secondary | ICD-10-CM | POA: Diagnosis not present

## 2018-04-24 DIAGNOSIS — S335XXA Sprain of ligaments of lumbar spine, initial encounter: Secondary | ICD-10-CM | POA: Diagnosis not present

## 2018-04-24 DIAGNOSIS — M9903 Segmental and somatic dysfunction of lumbar region: Secondary | ICD-10-CM | POA: Diagnosis not present

## 2018-04-24 DIAGNOSIS — M9901 Segmental and somatic dysfunction of cervical region: Secondary | ICD-10-CM | POA: Diagnosis not present

## 2018-05-03 DIAGNOSIS — J019 Acute sinusitis, unspecified: Secondary | ICD-10-CM | POA: Diagnosis not present

## 2018-05-03 DIAGNOSIS — H8303 Labyrinthitis, bilateral: Secondary | ICD-10-CM | POA: Diagnosis not present

## 2018-06-06 ENCOUNTER — Ambulatory Visit: Payer: Medicare Other | Admitting: *Deleted

## 2018-06-07 ENCOUNTER — Other Ambulatory Visit: Payer: Self-pay | Admitting: Family Medicine

## 2018-06-08 NOTE — Progress Notes (Addendum)
Subjective:   James [name] is a 77 y.o. male who presents for Medicare Annual/Subsequent preventive examination.  Pt is a runner. Has slowed down due to a baker's cyst behind left knee. Pt still attending gym regularly.  Review of Systems: No ROS.  Medicare Wellness Visit. Additional risk factors are reflected in the social history. Cardiac Risk Factors include: advanced age (>19men, >102 women);dyslipidemia;hypertension Sleep patterns: Takes Ambien. Sleeps well with med.  Home Safety/Smoke Alarms: Feels safe in home. Smoke alarms in place. Lives with girlfriend. 1 story with full basement.    Male:   CCS- 09/12/17    PSA-  Lab Results  Component Value Date   PSA 0.47 01/09/2018   PSA 0.51 04/23/2015   PSA 0.62 03/11/2014       Objective:    Vitals: BP 106/62 (BP Location: Left Arm, Patient Position: Sitting, Cuff Size: Normal)   Pulse (!) 56   Ht 5\' 10"  (1.778 m)   Wt 161 lb 3.2 oz (73.1 kg)   SpO2 97%   BMI 23.13 kg/m   Body mass index is 23.13 kg/m.  Advanced Directives 06/11/2018 06/05/2017 05/19/2016 05/19/2016 04/23/2015 08/21/2014 01/03/2012  Does Patient Have a Medical Advance Directive? No No No No No No Patient does not have advance directive;Patient would not like information  Would patient like information on creating a medical advance directive? Yes (MAU/Ambulatory/Procedural Areas - Information given) Yes (MAU/Ambulatory/Procedural Areas - Information given) Yes (MAU/Ambulatory/Procedural Areas - Information given) - Yes - Educational materials given - -  Pre-existing out of facility DNR order (yellow form or pink MOST form) - - - - - - No    Tobacco Social History   Tobacco Use  Smoking Status Never Smoker  Smokeless Tobacco Never Used     Counseling given: Not Answered   Clinical Intake:     Pain : No/denies pain                 Past Medical History:  Diagnosis Date  . AAA (abdominal aortic aneurysm) without rupture ([place]) 09/08/2013    . Abdominal bloating 05/19/2016  . Anxiety state 05/18/2014  . Atrial fibrillation ([place])   . 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 Benign paroxysmal positional vertigo 04/06/2013  . CAD (coronary artery disease)    nonobstructive by cath 2008; nonobstructive by Cardiac CT 11/2011  . Cervical spine fracture ([facility]) 1980   C6-7  . Diverticulosis   . Erectile dysfunction 09/08/2013  . GERD (gastroesophageal reflux disease)   . Hiatal hernia   . History of atrial fibrillation   . History of squamous cell carcinoma 10-05-10   Dr [name], [place]  . HLD (hyperlipidemia)   . Hypothyroidism   . Medicare annual wellness visit, subsequent 03/16/2014   Sees [facility] Follows with Dr Johnsie Cancel of cardiology Sees [facility] for colonoscopy, last colonoscopy in 2013   . Mitral valve prolapse   . Neck pain 05/03/2015  . Preventative health care 03/16/2014  . 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 Syncope 02/03/2013  . 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   Past Surgical History:  Procedure Laterality Date  . APPENDECTOMY  05/2011  . CARDIAC CATHETERIZATION  01/03/12   30% ostial LAD stenosis, ostial 30-40% D1 stenosis, smooth and normal left main and RCA; LVEF 65%  . CHOLECYSTECTOMY N/A 08/25/2014   Procedure: LAPAROSCOPIC CHOLECYSTECTOMY WITH INTRAOPERATIVE CHOLANGIOGRAM;  Surgeon: [name], MD;  Location: [facility];  Service: General;  Laterality: N/A;  . INGUINAL HERNIA REPAIR  07/2010   right  . KNEE ARTHROPLASTY     bilat  . LEFT HEART CATHETERIZATION WITH CORONARY ANGIOGRAM N/A 01/03/2012   Procedure: LEFT HEART CATHETERIZATION WITH CORONARY ANGIOGRAM;  Surgeon: [name], MD;  Location: [facility] CATH LAB;  Service: Cardiovascular;  Laterality: N/A;  . MRI  10/31/2016  . TONSILLECTOMY  1947  . tumor removal  1958   "fatty tumor"   Family History  Problem Relation Age of Onset  . Heart disease Mother        MI 70s  . Stroke Mother   .  Hypertension Mother   . Heart disease Maternal Grandfather        MI 70-80s  . Heart disease Maternal Grandmother        MI 70-80s  . Lung disease Father        penumonitis  . Cancer Sister        colon  . Cancer Paternal Uncle        GI: colon he thinks   Social History   Socioeconomic History  . Marital status: Single    Spouse name: Not on file  . Number of children: Not on file  . Years of education: Not on file  . Highest education level: Not on file  Occupational History  . 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 Financial resource strain: Not on file  . Food insecurity:    Worry: Not on file    Inability: Not on file  . Transportation needs:    Medical: Not on file    Non-medical: Not on file  Tobacco Use  . Smoking status: Never Smoker  . Smokeless tobacco: Never Used  Substance and Sexual Activity  . Alcohol use: Yes    Comment: 2 glasses red wine/day  . Drug use: No  . Sexual activity: Yes  Lifestyle  . Physical activity:    Days per week: Not on file    Minutes per session: Not on file  . Stress: Not on file  Relationships  . Social connections:    Talks on phone: Not on file    Gets together: Not on file    Attends religious service: Not on file    Active member of club or organization: Not on file    Attends meetings of clubs or organizations: Not on file    Relationship status: Not on file  Other Topics Concern  . Not on file  Social History Narrative  . Not on file    Outpatient Encounter Medications as of 06/11/2018  Medication Sig  . aspirin EC 81 MG tablet Take 81 mg by mouth daily.  Marland Kitchen atorvastatin (LIPITOR) 20 MG tablet TAKE 1 TABLET BY MOUTH EVERY DAY AT 6PM FOR CHOLESTEROL  . Calcium Carb-Cholecalciferol (CALCIUM 1000 + D PO) Take 1 tablet by mouth daily.  . Dutasteride-Tamsulosin HCl (JALYN) 0.5-0.4 MG CAPS Take 1 capsule by mouth at bedtime.  . fluticasone (FLONASE) 50 MCG/ACT nasal spray Place 2 sprays into both  nostrils daily.  Marland Kitchen levothyroxine (SYNTHROID, LEVOTHROID) 100 MCG tablet TAKE 1 TABLET BY MOUTH EVERY MORNING BEFORE BREAKFAST  . LORazepam (ATIVAN) 0.5 MG tablet TAKE 1 TABLET BY MOUTH 2 TIMES A DAY AS NEEDED FOR ANXIETY  . meclizine (ANTIVERT) 12.5 MG tablet 1-2 tab po tid prn dizziness  . Milk Thistle 1000 MG CAPS Take 1 capsule by mouth daily.  . mirtazapine (REMERON) 15 MG  tablet Take 1 tablet (15 mg total) by mouth daily.  . Multiple Vitamins-Minerals (MULTIVITAMINS THER. W/MINERALS) TABS Take 1 tablet by mouth daily.  . pantoprazole (PROTONIX) 40 MG tablet Take 1 tablet (40 mg total) by mouth 2 (two) times daily before a meal.  . Probiotic Product (PROBIOTIC DAILY PO) Take by mouth.  . sildenafil (REVATIO) 20 MG tablet TAKE 1 TO 5 TABLETS BY MOUTH DAILY IF NEEDED  . Testosterone 30 MG/ACT SOLN APPLY 1 PUMP UNDER EACH ARM EVERY MORNING AS DIRECTED  . zolpidem (AMBIEN) 10 MG tablet TAKE ONE TABLET BY MOUTH AT BEDTIME  . nitroGLYCERIN (NITROSTAT) 0.4 MG SL tablet Place 1 tablet (0.4 mg total) under the tongue every 5 (five) minutes x 3 doses as needed for chest pain. (Patient not taking: Reported on 08/24/2016)  . [DISCONTINUED] LORazepam (ATIVAN) 0.5 MG tablet Take 1 tablet (0.5 mg total) by mouth 2 (two) times daily as needed for anxiety.  . [DISCONTINUED] sildenafil (REVATIO) 20 MG tablet TAKE 1 TO 5 TABLETS BY MOUTH DAILY IF NEEDED  . [DISCONTINUED] zolpidem (AMBIEN) 10 MG tablet TAKE 1 TABLET BY MOUTH EVERY NIGHT AT BEDTIME   No facility-administered encounter medications on file as of 06/11/2018.     Activities of Daily Living In your present state of health, do you have any difficulty performing the following activities: 06/11/2018  Hearing? N  Vision? N  Difficulty concentrating or making decisions? 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  Walking or climbing stairs? N  Dressing or bathing? N  Doing errands, shopping? N  Preparing Food and  eating ? N  Using the Toilet? N  In the past six months, have you accidently leaked urine? N  Do you have problems with loss of bowel control? N  Managing your Medications? N  Managing your Finances? N  Housekeeping or managing your Housekeeping? N  Some recent data might be hidden    Patient Care Team: [name], MD as PCP - General (Family Medicine) Hollar, Katharine Look, MD as Referring Physician (Dermatology) Nahser, Wonda Cheng, MD as Consulting Physician (Cardiology) Carol Ada, MD as Consulting Physician (Gastroenterology)   Assessment:   This is a routine wellness examination for [employer]. Physical assessment deferred to PCP.  Exercise Activities and Dietary recommendations Current Exercise Habits: Structured exercise class, Type of exercise: strength training/weights;treadmill;calisthenics, Time (Minutes): 30, Frequency (Times/Week): 3, Weekly Exercise (Minutes/Week): 90, Intensity: Mild, Exercise limited by: None identified Diet (meal preparation, eat out, water intake, caffeinated beverages, dairy products, fruits and vegetables): well balanced, on average, 3 meals per day   Goals    . Healthy Lifestyle     Continue to eat heart healthy diet (full of fruits, vegetables, whole grains, lean protein, water--limit salt, fat, and sugar intake) and increase physical activity as tolerated. Continue doing brain stimulating activities (puzzles, reading, adult coloring books, staying active) to keep memory sharp.      . improve muscle strength and endurance.    . Increase physical activity     Continue running 5ks.  Participate in [employer].       Fall Risk Fall Risk  06/11/2018 06/05/2017 05/26/2016 05/19/2016 04/23/2015  Falls in the past year? 0 No No No No  Comment - - Emmi Telephone Survey: data to providers prior to load - -   Depression Screen PHQ 2/9 Scores 06/11/2018 06/05/2017 05/19/2016 04/23/2015  PHQ - 2 Score 0 0 0 0    Cognitive Function Ad8  score reviewed for issues:  Issues making decisions:no  Less interest in hobbies / activities:no  Repeats questions, stories (family complaining):no  Trouble using ordinary gadgets (microwave, computer, phone):no  Forgets the month or year: no  Mismanaging finances: no  Remembering appts:no  Daily problems with thinking and/or memory:no Ad8 score is=0   MMSE - Mini Mental State Exam 06/05/2017 05/19/2016  Orientation to time 5 5  Orientation to Place 5 5  Registration 3 3  Attention/ Calculation 5 5  Recall 3 3  Language- name 2 objects 2 2  Language- repeat 1 1  Language- follow 3 step command 3 3  Language- read & follow direction 1 1  Write a sentence 1 1  Copy design 1 1  Total score 30 30        Immunization History  Administered Date(s) Administered  . Influenza Whole 03/20/2012  . Influenza, High Dose Seasonal PF 04/02/2013, 04/23/2015  . Influenza,inj,Quad PF,6+ Mos 03/11/2014  . Influenza-Unspecified 03/03/2016  . Pneumococcal Conjugate-13 03/11/2014  . Pneumococcal Polysaccharide-23 04/18/2009  . Tdap 08/05/2009  . Zoster 09/10/2008    Screening Tests Health Maintenance  Topic Date Due  . INFLUENZA VACCINE  01/18/2018  . TETANUS/TDAP  08/06/2019  . PNA vac Low Risk Adult  Completed       Plan:    Please schedule your next medicare wellness visit with me in 1 yr.  Continue to eat heart healthy diet (full of fruits, vegetables, whole grains, lean protein, water--limit salt, fat, and sugar intake) and increase physical activity as tolerated.    I have personally reviewed and noted the following in the patient's chart:   . Medical and social history . Use of alcohol, tobacco or illicit drugs  . Current medications and supplements . Functional ability and status . Nutritional status . Physical activity . Advanced directives . List of other physicians . Hospitalizations, surgeries, and ER visits in previous 12  months . Vitals . Screenings to include cognitive, depression, and falls . Referrals and appointments  In addition, I have reviewed and discussed with patient certain preventive protocols, quality metrics, and best practice recommendations. A written personalized care plan for preventive services as well as general preventive health recommendations were provided to patient.     Shela Nevin, [place]  06/11/2018  I reviewed the above Medicare wellness note by Ms. Vevelyn Royals, and agree with her documentation Denny Peon MD

## 2018-06-08 NOTE — Telephone Encounter (Signed)
Requesting: Ambien  & ativan  Contract: yes [id number] risk next screen 03/20/18 Last OV:01/09/18 Next OV:07/16/18 Last Refill: ambien 04/11/18 #30-1rf Ativan 01/09/18 #30-5rf Database:   Please advise

## 2018-06-10 NOTE — Telephone Encounter (Signed)
I have sent in refills but his UDS is due to have him do it when he comes in for UDS on 06/11/18

## 2018-06-11 ENCOUNTER — Ambulatory Visit (INDEPENDENT_AMBULATORY_CARE_PROVIDER_SITE_OTHER): Payer: Medicare Other | Admitting: *Deleted

## 2018-06-11 ENCOUNTER — Other Ambulatory Visit: Payer: Self-pay | Admitting: Family Medicine

## 2018-06-11 ENCOUNTER — Encounter: Payer: Self-pay | Admitting: *Deleted

## 2018-06-11 ENCOUNTER — Other Ambulatory Visit: Payer: Medicare Other

## 2018-06-11 VITALS — BP 106/62 | HR 56 | Ht 70.0 in | Wt 161.2 lb

## 2018-06-11 DIAGNOSIS — Z79899 Other long term (current) drug therapy: Secondary | ICD-10-CM

## 2018-06-11 DIAGNOSIS — Z0000 Encounter for general adult medical examination without abnormal findings: Secondary | ICD-10-CM | POA: Diagnosis not present

## 2018-06-11 NOTE — Patient Instructions (Signed)
Please schedule your next medicare wellness visit with me in 1 yr.  Continue to eat heart healthy diet (full of fruits, vegetables, whole grains, lean protein, water--limit salt, fat, and sugar intake) and increase physical activity as tolerated.   James [name] , Thank you for taking time to come for your Medicare Wellness Visit. I appreciate your ongoing commitment to your health goals. Please review the following plan we discussed and let me know if I can assist you in the future.   These are the goals we discussed: Goals    . Healthy Lifestyle     Continue to eat heart healthy diet (full of fruits, vegetables, whole grains, lean protein, water--limit salt, fat, and sugar intake) and increase physical activity as tolerated. Continue doing brain stimulating activities (puzzles, reading, adult coloring books, staying active) to keep memory sharp.      . improve muscle strength and endurance.    . Increase physical activity     Continue running 5ks.  Participate in [employer].       This is a list of the screening recommended for you and due dates:  Health Maintenance  Topic Date Due  . Flu Shot  01/18/2018  . Tetanus Vaccine  08/06/2019  . Pneumonia vaccines  Completed    Health Maintenance After Age 9 After age 71, you are at a higher risk for certain long-term diseases and infections as well as injuries from falls. Falls are a major cause of broken bones and head injuries in people who are older than age 4. Getting regular preventive care can help to keep you healthy and well. Preventive care includes getting regular testing and making lifestyle changes as recommended by your health care provider. Talk with your health care provider about:  Which screenings and tests you should have. A screening is a test that checks for a disease when you have no symptoms.  A diet and exercise plan that is right for you. What should I know about screenings and tests to prevent  falls? Screening and testing are the best ways to find a health problem early. Early diagnosis and treatment give you the best chance of managing medical conditions that are common after age 68. Certain conditions and lifestyle choices may make you more likely to have a fall. Your health care provider may recommend:  Regular vision checks. Poor vision and conditions such as cataracts can make you more likely to have a fall. If you wear glasses, make sure to get your prescription updated if your vision changes.  Medicine review. Work with your health care provider to regularly review all of the medicines you are taking, including over-the-counter medicines. Ask your health care provider about any side effects that may make you more likely to have a fall. Tell your health care provider if any medicines that you take make you feel dizzy or sleepy.  Osteoporosis screening. Osteoporosis is a condition that causes the bones to get weaker. This can make the bones weak and cause them to break more easily.  Blood pressure screening. Blood pressure changes and medicines to control blood pressure can make you feel dizzy.  Strength and balance checks. Your health care provider may recommend certain tests to check your strength and balance while standing, walking, or changing positions.  Foot health exam. Foot pain and numbness, as well as not wearing proper footwear, can make you more likely to have a fall.  Depression screening. You may be more likely to have a fall  if you have a fear of falling, feel emotionally low, or feel unable to do activities that you used to do.  Alcohol use screening. Using too much alcohol can affect your balance and may make you more likely to have a fall. What actions can I take to lower my risk of falls? General instructions  Talk with your health care provider about your risks for falling. Tell your health care provider if: ? You fall. Be sure to tell your health care  provider about all falls, even ones that seem minor. ? You feel dizzy, sleepy, or off-balance.  Take over-the-counter and prescription medicines only as told by your health care provider. These include any supplements.  Eat a healthy diet and maintain a healthy weight. A healthy diet includes low-fat dairy products, low-fat (lean) meats, and fiber from whole grains, beans, and lots of fruits and vegetables. Home safety  Remove any tripping hazards, such as rugs, cords, and clutter.  Install safety equipment such as grab bars in bathrooms and safety rails on stairs.  Keep rooms and walkways well-lit. Activity   Follow a regular exercise program to stay fit. This will help you maintain your balance. Ask your health care provider what types of exercise are appropriate for you.  If you need a cane or walker, use it as recommended by your health care provider.  Wear supportive shoes that have nonskid soles. Lifestyle  Do not drink alcohol if your health care provider tells you not to drink.  If you drink alcohol, limit how much you have: ? 0-1 drink a day for women. ? 0-2 drinks a day for men.  Be aware of how much alcohol is in your drink. In the [place] one drink equals one typical bottle of beer (12 oz), one-half glass of wine (5 oz), or one shot of hard liquor (1 oz).  Do not use any products that contain nicotine or tobacco, such as cigarettes and e-cigarettes. If you need help quitting, ask your health care provider. Summary  Having a healthy lifestyle and getting preventive care can help to protect your health and wellness after age 32.  Screening and testing are the best way to find a health problem early and help you avoid having a fall. Early diagnosis and treatment give you the best chance for managing medical conditions that are more common for people who are older than age 83.  Falls are a major cause of broken bones and head injuries in people who are older than age 55.  Take precautions to prevent a fall at home.  Work with your health care provider to learn what changes you can make to improve your health and wellness and to prevent falls. This information is not intended to replace advice given to you by your health care provider. Make sure you discuss any questions you have with your health care provider. Document Released: 04/19/2017 Document Revised: 04/19/2017 Document Reviewed: 04/19/2017 Elsevier Interactive Patient Education  2019 [employer].

## 2018-06-11 NOTE — Progress Notes (Signed)
Ordered UDS 

## 2018-06-11 NOTE — Progress Notes (Unsigned)
Requesting:testoserone  Contract:yes [id number] today 06/11/18... Last OV:01/09/18 Next OV:07/16/18 Last Refill:01/09/18  #18ml-1rf Database:   Please advise

## 2018-06-12 ENCOUNTER — Other Ambulatory Visit: Payer: Self-pay | Admitting: Family Medicine

## 2018-06-12 DIAGNOSIS — I10 Essential (primary) hypertension: Secondary | ICD-10-CM

## 2018-06-12 MED ORDER — TESTOSTERONE 30 MG/ACT TD SOLN: TRANSDERMAL | 0 refills | Status: DC

## 2018-06-12 NOTE — Telephone Encounter (Signed)
Patient did UDS yesterday

## 2018-06-14 ENCOUNTER — Other Ambulatory Visit: Payer: Self-pay | Admitting: Family Medicine

## 2018-06-14 LAB — PAIN MGMT, PROFILE 8 W/CONF, U
6 Acetylmorphine: NEGATIVE ng/mL (ref <10)
ALCOHOL METABOLITES: POSITIVE ng/mL — AB (ref <500)
AMINOCLONAZEPAM: 31 ng/mL — AB (ref <25)
Alphahydroxyalprazolam: NEGATIVE ng/mL (ref <25)
Alphahydroxymidazolam: NEGATIVE ng/mL (ref <50)
Alphahydroxytriazolam: NEGATIVE ng/mL (ref <50)
Amphetamines: NEGATIVE ng/mL (ref <500)
BENZODIAZEPINES: POSITIVE ng/mL — AB (ref <100)
BUPRENORPHINE, URINE: NEGATIVE ng/mL (ref <5)
Cocaine Metabolite: NEGATIVE ng/mL (ref <150)
Creatinine: 66.2 mg/dL
ETHYL SULFATE (ETS): 4941 ng/mL — AB (ref <100)
Ethyl Glucuronide (ETG): 15246 ng/mL — ABNORMAL HIGH (ref <500)
HYDROXYETHYLFLURAZEPAM: NEGATIVE ng/mL (ref <50)
LORAZEPAM: 211 ng/mL — AB (ref <50)
MARIJUANA METABOLITE: NEGATIVE ng/mL (ref <20)
MDMA: NEGATIVE ng/mL (ref <500)
Nordiazepam: NEGATIVE ng/mL (ref <50)
Opiates: NEGATIVE ng/mL (ref <100)
Oxazepam: NEGATIVE ng/mL (ref <50)
Oxidant: NEGATIVE ug/mL (ref <200)
Oxycodone: NEGATIVE ng/mL (ref <100)
TEMAZEPAM: NEGATIVE ng/mL (ref <50)
pH: 7.09 (ref 4.5–9.0)

## 2018-06-14 NOTE — Telephone Encounter (Signed)
Medication: AXIRON 30 MG/ACT SOLN   Pt states at [place] stated this would be called in for pt. Pharmacy does not have med.   Not on current med profile.   Preferred Pharmacy (with phone number or street name): [id number] - [place], [place] - [address] [phone] (Phone) [phone] (Fax)

## 2018-06-14 NOTE — Telephone Encounter (Signed)
I sent it to [facility] on 12/24 please confirm with the drug store

## 2018-06-14 NOTE — Telephone Encounter (Signed)
Copied from [place] [id number]. Topic: Quick Communication - Rx Refill/Question >> Jun 14, 2018 10:47 AM Bea Graff, NT wrote: Medication: AXIRON 30 MG/ACT SOLN   Pt states he mentioned during his AWV and nurse stated this would be called in for pt. Pharmacy does not have med. [facility]  Has the patient contacted their pharmacy? Yes.   (Agent: If no, request that the patient contact the pharmacy for the refill.) (Agent: If yes, when and what did the pharmacy advise?)  Preferred Pharmacy (with phone number or street name): [id number] - [place], [place] - [address] [phone] (Phone) [phone] (Fax)    Agent: Please be advised that RX refills may take up to 3 business days. We ask that you follow-up with your pharmacy.

## 2018-06-15 NOTE — Telephone Encounter (Signed)
[facility] Chief Strategy Officer then phoned [facility], who said they received it but could not fill it per pt's insurance. Author then [facility] [place] [facility] VO, spoke with Mickel Baas who confirmed order as written in [facility]. Author phoned pt. To make aware, and to remind about upcoming 1/27 appointment with Dr. Charlett Blake, and Dr. Charlett Blake appreciative.

## 2018-07-16 ENCOUNTER — Ambulatory Visit (INDEPENDENT_AMBULATORY_CARE_PROVIDER_SITE_OTHER): Payer: Medicare Other | Admitting: Family Medicine

## 2018-07-16 VITALS — BP 108/62 | HR 52 | Temp 97.8°F | Resp 18 | Ht 70.0 in | Wt 161.6 lb

## 2018-07-16 DIAGNOSIS — I10 Essential (primary) hypertension: Secondary | ICD-10-CM | POA: Diagnosis not present

## 2018-07-16 DIAGNOSIS — E039 Hypothyroidism, unspecified: Secondary | ICD-10-CM

## 2018-07-16 DIAGNOSIS — N400 Enlarged prostate without lower urinary tract symptoms: Secondary | ICD-10-CM

## 2018-07-16 DIAGNOSIS — Z0000 Encounter for general adult medical examination without abnormal findings: Secondary | ICD-10-CM | POA: Diagnosis not present

## 2018-07-16 DIAGNOSIS — R140 Abdominal distension (gaseous): Secondary | ICD-10-CM

## 2018-07-16 DIAGNOSIS — R7989 Other specified abnormal findings of blood chemistry: Secondary | ICD-10-CM | POA: Diagnosis not present

## 2018-07-16 DIAGNOSIS — E785 Hyperlipidemia, unspecified: Secondary | ICD-10-CM | POA: Diagnosis not present

## 2018-07-16 MED ORDER — TESTOSTERONE 30 MG/ACT TD SOLN: TRANSDERMAL | 1 refills | Status: DC

## 2018-07-16 MED ORDER — ZOLPIDEM TARTRATE 10 MG PO TABS: 10.0000 mg | ORAL_TABLET | Freq: Every day | ORAL | 5 refills | Status: DC

## 2018-07-16 NOTE — Assessment & Plan Note (Signed)
Patient encouraged to maintain heart healthy diet, regular exercise, adequate sleep. Consider daily probiotics. Take medications as prescribed. Given and reviewed copy of ACP documents from  Secretary of State and encouraged to complete and return 

## 2018-07-16 NOTE — Assessment & Plan Note (Signed)
Is tolerating supplements so will refill and monitor

## 2018-07-16 NOTE — Assessment & Plan Note (Signed)
Encouraged heart healthy diet, increase exercise, avoid trans fats, consider a krill oil cap daily 

## 2018-07-16 NOTE — Assessment & Plan Note (Signed)
On Levothyroxine, continue to monitor 

## 2018-07-16 NOTE — Progress Notes (Signed)
Subjective:    Patient ID: James [name], male    DOB: 03-16-1941, 78 y.o.   MRN: [id number]  No chief complaint on file.   HPI Patient is in today for annual preventative exam and follow-up on chronic medical concerns.  His chronic medical concerns include hypertension, insomnia, hyperlipidemia and hypothyroidism.  He notes a good response to Ambien which he uses for insomnia is in need of a refill.  He has continued to note some mild abdominal bloating but it is improved with probiotics and fiber supplements.  No recent febrile illness or hospitalization.  He continues to stay active, exercise regularly and maintain a heart healthy diet.  Is doing well with activities of daily living.  Denies any recent acute concerns. Denies CP/palp/SOB/HA/congestion/fevers/GI or GU c/o. Taking meds as prescribed  Past Medical History:  Diagnosis Date  . AAA (abdominal aortic aneurysm) without rupture ([place]) 09/08/2013  . Abdominal bloating 05/19/2016  . Anxiety state 05/18/2014  . Atrial fibrillation ([place])   . 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 Benign paroxysmal positional vertigo 04/06/2013  . CAD (coronary artery disease)    nonobstructive by cath 2008; nonobstructive by Cardiac CT 11/2011  . Cervical spine fracture ([facility]) 1980   C6-7  . Diverticulosis   . Erectile dysfunction 09/08/2013  . GERD (gastroesophageal reflux disease)   . Hiatal hernia   . History of atrial fibrillation   . History of squamous cell carcinoma 10-05-10   Dr [name], [place]  . HLD (hyperlipidemia)   . Hypothyroidism   . Medicare annual wellness visit, subsequent 03/16/2014   Sees [facility] Follows with Dr Johnsie Cancel of cardiology Sees [facility] for colonoscopy, last colonoscopy in 2013   . Mitral valve prolapse   . Neck pain 05/03/2015  . Preventative health care 03/16/2014  . 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 Syncope 02/03/2013  . 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      Past Surgical History:  Procedure Laterality Date  . APPENDECTOMY  05/2011  . CARDIAC CATHETERIZATION  01/03/12   30% ostial LAD stenosis, ostial 30-40% D1 stenosis, smooth and normal left main and RCA; LVEF 65%  . CHOLECYSTECTOMY N/A 08/25/2014   Procedure: LAPAROSCOPIC CHOLECYSTECTOMY WITH INTRAOPERATIVE CHOLANGIOGRAM;  Surgeon: [name], MD;  Location: [facility];  Service: General;  Laterality: N/A;  . INGUINAL HERNIA REPAIR  07/2010   right  . KNEE ARTHROPLASTY     bilat  . LEFT HEART CATHETERIZATION WITH CORONARY ANGIOGRAM N/A 01/03/2012   Procedure: LEFT HEART CATHETERIZATION WITH CORONARY ANGIOGRAM;  Surgeon: [name], MD;  Location: [facility] CATH LAB;  Service: Cardiovascular;  Laterality: N/A;  . MRI  10/31/2016  . TONSILLECTOMY  1947  . tumor removal  1958   "fatty tumor"    Family History  Problem Relation Age of Onset  . Heart disease Mother        MI 72s  . Stroke Mother   . Hypertension Mother   . Heart disease Maternal Grandfather        MI 70-80s  . Heart disease Maternal Grandmother        MI 70-80s  . Lung disease Father        penumonitis  . Cancer Sister        colon  . Cancer Paternal Uncle        GI: colon he thinks    Social History   Socioeconomic History  . Marital status:  Single    Spouse name: Not on file  . Number of children: Not on file  . Years of education: Not on file  . Highest education level: Not on file  Occupational History  . 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 Financial resource strain: Not on file  . Food insecurity:    Worry: Not on file    Inability: Not on file  . Transportation needs:    Medical: Not on file    Non-medical: Not on file  Tobacco Use  . Smoking status: Never Smoker  . Smokeless tobacco: Never Used  Substance and Sexual Activity  . Alcohol use: Yes    Comment: 2 glasses red wine/day  . Drug use: No  . Sexual activity: Yes  Lifestyle  . Physical activity:    Days per  week: Not on file    Minutes per session: Not on file  . Stress: Not on file  Relationships  . Social connections:    Talks on phone: Not on file    Gets together: Not on file    Attends religious service: Not on file    Active member of club or organization: Not on file    Attends meetings of clubs or organizations: Not on file    Relationship status: Not on file  . Intimate partner violence:    Fear of current or ex partner: Not on file    Emotionally abused: Not on file    Physically abused: Not on file    Forced sexual activity: Not on file  Other Topics Concern  . Not on file  Social History Narrative  . Not on file    Outpatient Medications Prior to Visit  Medication Sig Dispense Refill  . aspirin EC 81 MG tablet Take 81 mg by mouth daily.    Marland Kitchen atorvastatin (LIPITOR) 20 MG tablet TAKE 1 TABLET BY MOUTH EVERY DAY AT 6PM FOR CHOLESTEROL 90 tablet 1  . Calcium Carb-Cholecalciferol (CALCIUM 1000 + D PO) Take 1 tablet by mouth daily.    . Dutasteride-Tamsulosin HCl (JALYN) 0.5-0.4 MG CAPS Take 1 capsule by mouth at bedtime. 90 capsule 1  . fluticasone (FLONASE) 50 MCG/ACT nasal spray Place 2 sprays into both nostrils daily. 16 g 1  . levothyroxine (SYNTHROID, LEVOTHROID) 100 MCG tablet TAKE 1 TABLET BY MOUTH EVERY MORNING BEFORE BREAKFAST 90 tablet 1  . LORazepam (ATIVAN) 0.5 MG tablet TAKE 1 TABLET BY MOUTH 2 TIMES A DAY AS NEEDED FOR ANXIETY 30 tablet 1  . meclizine (ANTIVERT) 12.5 MG tablet 1-2 tab po tid prn dizziness 30 tablet 1  . Milk Thistle 1000 MG CAPS Take 1 capsule by mouth daily.    . mirtazapine (REMERON) 15 MG tablet Take 1 tablet (15 mg total) by mouth daily. 90 tablet 2  . Multiple Vitamins-Minerals (MULTIVITAMINS THER. W/MINERALS) TABS Take 1 tablet by mouth daily.    . pantoprazole (PROTONIX) 40 MG tablet Take 1 tablet (40 mg total) by mouth 2 (two) times daily before a meal. 90 tablet 1  . Probiotic Product (PROBIOTIC DAILY PO) Take by mouth.    . sildenafil  (REVATIO) 20 MG tablet TAKE 1 TO 5 TABLETS BY MOUTH DAILY IF NEEDED 35 tablet 1  . Testosterone 30 MG/ACT SOLN APPLY 1 PUMP UNDER EACH ARM EVERY MORNING AS DIRECTED 180 mL 0  . zolpidem (AMBIEN) 10 MG tablet TAKE ONE TABLET BY MOUTH AT BEDTIME 30 tablet 1  . nitroGLYCERIN (NITROSTAT) 0.4  MG SL tablet Place 1 tablet (0.4 mg total) under the tongue every 5 (five) minutes x 3 doses as needed for chest pain. (Patient not taking: Reported on 08/24/2016) 25 tablet 3   No facility-administered medications prior to visit.     Allergies  Allergen Reactions  . Sulfa Antibiotics Other (See Comments)    Childhood allergy    Review of Systems  Constitutional: Negative for chills, fever and malaise/fatigue.  HENT: Negative for congestion and hearing loss.   Eyes: Negative for discharge.  Respiratory: Negative for cough, sputum production and shortness of breath.   Cardiovascular: Negative for chest pain, palpitations and leg swelling.  Gastrointestinal: Negative for abdominal pain, blood in stool, constipation, diarrhea, heartburn, nausea and vomiting.  Genitourinary: Negative for dysuria, frequency, hematuria and urgency.  Musculoskeletal: Negative for back pain, falls and myalgias.  Skin: Negative for rash.  Neurological: Negative for dizziness, sensory change, loss of consciousness, weakness and headaches.  Endo/Heme/Allergies: Negative for environmental allergies. Does not bruise/bleed easily.  Psychiatric/Behavioral: Negative for depression and suicidal ideas. The patient is not nervous/anxious and does not have insomnia.        Objective:    Physical Exam Vitals signs and nursing note reviewed.  Constitutional:      General: He is not in acute distress.    Appearance: He is well-developed.  HENT:     Head: Normocephalic and atraumatic.     Nose: Nose normal.  Eyes:     General:        Right eye: No discharge.        Left eye: No discharge.  Neck:     Musculoskeletal: Normal range  of motion and neck supple.  Cardiovascular:     Rate and Rhythm: Normal rate and regular rhythm.     Heart sounds: No murmur.  Pulmonary:     Effort: Pulmonary effort is normal.     Breath sounds: Normal breath sounds.  Abdominal:     General: Bowel sounds are normal.     Palpations: Abdomen is soft.     Tenderness: There is no abdominal tenderness.  Skin:    General: Skin is warm and dry.  Neurological:     Mental Status: He is alert and oriented to person, place, and time.     BP 108/62 (BP Location: Left Arm, Patient Position: Sitting, Cuff Size: Normal)   Pulse (!) 52   Temp 97.8 F (36.6 C) (Oral)   Resp 18   Ht 5\' 10"  (1.778 m)   Wt 161 lb 9.6 oz (73.3 kg)   SpO2 98%   BMI 23.19 kg/m  Wt Readings from Last 3 Encounters:  07/16/18 161 lb 9.6 oz (73.3 kg)  06/11/18 161 lb 3.2 oz (73.1 kg)  06/05/17 157 lb 6.4 oz (71.4 kg)     Lab Results  Component Value Date   WBC 7.3 01/09/2018   HGB 14.0 01/09/2018   HCT 41.3 01/09/2018   PLT 219.0 01/09/2018   GLUCOSE 92 01/09/2018   CHOL 116 01/09/2018   TRIG 58.0 01/09/2018   HDL 59.80 01/09/2018   LDLCALC 45 01/09/2018   ALT 16 01/09/2018   AST 16 01/09/2018   NA 138 01/09/2018   K 5.2 (H) 01/09/2018   CL 101 01/09/2018   CREATININE 0.88 01/09/2018   BUN 13 01/09/2018   CO2 31 01/09/2018   TSH 1.35 01/09/2018   PSA 0.47 01/09/2018   INR 1.09 08/21/2014    Lab Results  Component Value Date  TSH 1.35 01/09/2018   Lab Results  Component Value Date   WBC 7.3 01/09/2018   HGB 14.0 01/09/2018   HCT 41.3 01/09/2018   MCV 94.3 01/09/2018   PLT 219.0 01/09/2018   Lab Results  Component Value Date   NA 138 01/09/2018   K 5.2 (H) 01/09/2018   CO2 31 01/09/2018   GLUCOSE 92 01/09/2018   BUN 13 01/09/2018   CREATININE 0.88 01/09/2018   BILITOT 0.6 01/09/2018   ALKPHOS 62 01/09/2018   AST 16 01/09/2018   ALT 16 01/09/2018   PROT 7.2 01/09/2018   ALBUMIN 4.2 01/09/2018   CALCIUM 9.6 01/09/2018    ANIONGAP 6 08/21/2014   GFR 89.00 01/09/2018   Lab Results  Component Value Date   CHOL 116 01/09/2018   Lab Results  Component Value Date   HDL 59.80 01/09/2018   Lab Results  Component Value Date   LDLCALC 45 01/09/2018   Lab Results  Component Value Date   TRIG 58.0 01/09/2018   Lab Results  Component Value Date   CHOLHDL 2 01/09/2018   No results found for: HGBA1C     Assessment & Plan:   Problem List Items Addressed This Visit    Hyperlipidemia    Encouraged heart healthy diet, increase exercise, avoid trans fats, consider a krill oil cap daily      Relevant Orders   Lipid panel   Hypothyroidism    On Levothyroxine, continue to monitor      Low testosterone    Is tolerating supplements so will refill and monitor      Relevant Orders   Testosterone   Preventative health care    Patient encouraged to maintain heart healthy diet, regular exercise, adequate sleep. Consider daily probiotics. Take medications as prescribed.Given and reviewed copy of ACP documents from [employer] and encouraged to complete and return      BPH (benign prostatic hyperplasia)    No urinary concerns and PSA normal      Abdominal bloating    Improved with fiber and probiotic supplements      Essential hypertension    Well controlled, no changes to meds. Encouraged heart healthy diet such as the DASH diet and exercise as tolerated.       Relevant Medications   Testosterone 30 MG/ACT SOLN   Other Relevant Orders   CBC   Comprehensive metabolic panel   TSH      I have changed Lissa Merlin E. Decoster's zolpidem. I am also having him maintain his multivitamins ther. w/minerals, Calcium Carb-Cholecalciferol (CALCIUM 1000 + D PO), nitroGLYCERIN, Probiotic Product (PROBIOTIC DAILY PO), aspirin EC, Milk Thistle, fluticasone, meclizine, pantoprazole, levothyroxine, atorvastatin, mirtazapine, Dutasteride-Tamsulosin HCl, sildenafil, LORazepam, and Testosterone.  Meds ordered  this encounter  Medications  . Testosterone 30 MG/ACT SOLN    Sig: APPLY 1 PUMP UNDER EACH ARM EVERY MORNING AS DIRECTED    Dispense:  180 mL    Refill:  1  . zolpidem (AMBIEN) 10 MG tablet    Sig: Take 1 tablet (10 mg total) by mouth at bedtime.    Dispense:  30 tablet    Refill:  5     Penni Homans, MD

## 2018-07-16 NOTE — Patient Instructions (Addendum)
Culturelle and Phillip's Colon probiotics are good options  Shingrix is the new shingles shot, 2 shots over 2-6 month window at the pharmacy   Encouraged increased rest and hydration, add probiotics, zinc such as Coldeze or Xicam. Treat fevers as needed, Mucinex twice daily, vitamin C [phone] mg daily and elderberry Preventive Care 79 Years and Older, Male Preventive care refers to lifestyle choices and visits with your health care provider that can promote health and wellness. What does preventive care include?   A yearly physical exam. This is also called an annual well check.  Dental exams once or twice a year.  Routine eye exams. Ask your health care provider how often you should have your eyes checked.  Personal lifestyle choices, including: ? Daily care of your teeth and gums. ? Regular physical activity. ? Eating a healthy diet. ? Avoiding tobacco and drug use. ? Limiting alcohol use. ? Practicing safe sex. ? Taking low doses of aspirin every day. ? Taking vitamin and mineral supplements as recommended by your health care provider. What happens during an annual well check? The services and screenings done by your health care provider during your annual well check will depend on your age, overall health, lifestyle risk factors, and family history of disease. Counseling Your health care provider may ask you questions about your:  Alcohol use.  Tobacco use.  Drug use.  Emotional well-being.  Home and relationship well-being.  Sexual activity.  Eating habits.  History of falls.  Memory and ability to understand (cognition).  Work and work Statistician. Screening You may have the following tests or measurements:  Height, weight, and BMI.  Blood pressure.  Lipid and cholesterol levels. These may be checked every 5 years, or more frequently if you are over 44 years old.  Skin check.  Lung cancer screening. You may have this screening every year starting at  age 44 if you have a 30-pack-year history of smoking and currently smoke or have quit within the past 15 years.  Colorectal cancer screening. All adults should have this screening starting at age 33 and continuing until age 66. You will have tests every 1-10 years, depending on your results and the type of screening test. People at increased risk should start screening at an earlier age. Screening tests may include: ? Guaiac-based fecal occult blood testing. ? Fecal immunochemical test (FIT). ? Stool DNA test. ? Virtual colonoscopy. ? Sigmoidoscopy. During this test, a flexible tube with a tiny camera (sigmoidoscope) is used to examine your rectum and lower colon. The sigmoidoscope is inserted through your anus into your rectum and lower colon. ? Colonoscopy. During this test, a long, thin, flexible tube with a tiny camera (colonoscope) is used to examine your entire colon and rectum.  Prostate cancer screening. Recommendations will vary depending on your family history and other risks.  Hepatitis C blood test.  Hepatitis B blood test.  Sexually transmitted disease (STD) testing.  Diabetes screening. This is done by checking your blood sugar (glucose) after you have not eaten for a while (fasting). You may have this done every 1-3 years.  Abdominal aortic aneurysm (AAA) screening. You may need this if you are a current or former smoker.  Osteoporosis. You may be screened starting at age 47 if you are at high risk. Talk with your health care provider about your test results, treatment options, and if necessary, the need for more tests. Vaccines Your health care provider may recommend certain vaccines, such as:  Influenza vaccine.  This is recommended every year.  Tetanus, diphtheria, and acellular pertussis (Tdap, Td) vaccine. You may need a Td booster every 10 years.  Varicella vaccine. You may need this if you have not been vaccinated.  Zoster vaccine. You may need this after age  87.  Measles, mumps, and rubella (MMR) vaccine. You may need at least one dose of MMR if you were born in 1957 or later. You may also need a second dose.  Pneumococcal 13-valent conjugate (PCV13) vaccine. One dose is recommended after age 28.  Pneumococcal polysaccharide (PPSV23) vaccine. One dose is recommended after age 90.  Meningococcal vaccine. You may need this if you have certain conditions.  Hepatitis A vaccine. You may need this if you have certain conditions or if you travel or work in places where you may be exposed to hepatitis A.  Hepatitis B vaccine. You may need this if you have certain conditions or if you travel or work in places where you may be exposed to hepatitis B.  Haemophilus influenzae type b (Hib) vaccine. You may need this if you have certain risk factors. Talk to your health care provider about which screenings and vaccines you need and how often you need them. This information is not intended to replace advice given to you by your health care provider. Make sure you discuss any questions you have with your health care provider. Document Released: 07/03/2015 Document Revised: 07/27/2017 Document Reviewed: 04/07/2015 Elsevier Interactive Patient Education  2019 [employer].

## 2018-07-16 NOTE — Assessment & Plan Note (Signed)
Well controlled, no changes to meds. Encouraged heart healthy diet such as the DASH diet and exercise as tolerated.  °

## 2018-07-16 NOTE — Assessment & Plan Note (Signed)
No urinary concerns and PSA normal

## 2018-07-16 NOTE — Assessment & Plan Note (Signed)
Improved with fiber and probiotic supplements

## 2018-07-17 LAB — LIPID PANEL
Cholesterol: 125 mg/dL (ref 0–200)
HDL: 55.6 mg/dL (ref >39.00)
LDL Cholesterol: 58 mg/dL (ref 0–99)
NONHDL: 69.55
Total CHOL/HDL Ratio: 2
Triglycerides: 59 mg/dL (ref 0.0–149.0)
VLDL: 11.8 mg/dL (ref 0.0–40.0)

## 2018-07-17 LAB — COMPREHENSIVE METABOLIC PANEL
ALT: 18 U/L (ref 0–53)
AST: 22 U/L (ref 0–37)
Albumin: 4.2 g/dL (ref 3.5–5.2)
Alkaline Phosphatase: 68 U/L (ref 39–117)
BILIRUBIN TOTAL: 0.7 mg/dL (ref 0.2–1.2)
BUN: 11 mg/dL (ref 6–23)
CO2: 29 mEq/L (ref 19–32)
CREATININE: 0.83 mg/dL (ref 0.40–1.50)
Calcium: 9.4 mg/dL (ref 8.4–10.5)
Chloride: 100 mEq/L (ref 96–112)
GFR: 89.46 mL/min (ref >60.00)
Glucose, Bld: 88 mg/dL (ref 70–99)
Potassium: 4.2 mEq/L (ref 3.5–5.1)
Sodium: 135 mEq/L (ref 135–145)
Total Protein: 6.9 g/dL (ref 6.0–8.3)

## 2018-07-17 LAB — CBC
HCT: 41.7 % (ref 39.0–52.0)
Hemoglobin: 14.4 g/dL (ref 13.0–17.0)
MCHC: 34.6 g/dL (ref 30.0–36.0)
MCV: 93.3 fl (ref 78.0–100.0)
Platelets: 229 10*3/uL (ref 150.0–400.0)
RBC: 4.47 Mil/uL (ref 4.22–5.81)
RDW: 13.4 % (ref 11.5–15.5)
WBC: 6.9 10*3/uL (ref 4.0–10.5)

## 2018-07-17 LAB — TESTOSTERONE: Testosterone: 752.49 ng/dL (ref 300.00–890.00)

## 2018-07-17 LAB — TSH: TSH: 1.52 u[IU]/mL (ref 0.35–4.50)

## 2018-07-24 ENCOUNTER — Other Ambulatory Visit: Payer: Self-pay | Admitting: Family Medicine

## 2018-07-24 ENCOUNTER — Other Ambulatory Visit: Payer: Self-pay | Admitting: Medical

## 2018-07-26 ENCOUNTER — Other Ambulatory Visit: Payer: Self-pay | Admitting: Medical

## 2018-08-04 ENCOUNTER — Other Ambulatory Visit: Payer: Self-pay | Admitting: Family Medicine

## 2018-08-27 ENCOUNTER — Other Ambulatory Visit: Payer: Self-pay | Admitting: Medical

## 2018-08-27 ENCOUNTER — Other Ambulatory Visit: Payer: Self-pay | Admitting: Family Medicine

## 2018-09-01 ENCOUNTER — Other Ambulatory Visit: Payer: Self-pay | Admitting: Family Medicine

## 2018-10-16 ENCOUNTER — Other Ambulatory Visit: Payer: Self-pay | Admitting: Medical

## 2018-11-01 ENCOUNTER — Other Ambulatory Visit: Payer: Self-pay | Admitting: Family Medicine

## 2018-11-02 ENCOUNTER — Other Ambulatory Visit: Payer: Self-pay | Admitting: Family Medicine

## 2018-11-02 DIAGNOSIS — I10 Essential (primary) hypertension: Secondary | ICD-10-CM

## 2018-11-05 DIAGNOSIS — Z08 Encounter for follow-up examination after completed treatment for malignant neoplasm: Secondary | ICD-10-CM | POA: Diagnosis not present

## 2018-11-05 DIAGNOSIS — Z808 Family history of malignant neoplasm of other organs or systems: Secondary | ICD-10-CM | POA: Diagnosis not present

## 2018-11-05 DIAGNOSIS — Z85828 Personal history of other malignant neoplasm of skin: Secondary | ICD-10-CM | POA: Diagnosis not present

## 2018-11-05 DIAGNOSIS — D485 Neoplasm of uncertain behavior of skin: Secondary | ICD-10-CM | POA: Diagnosis not present

## 2018-11-05 DIAGNOSIS — L820 Inflamed seborrheic keratosis: Secondary | ICD-10-CM | POA: Diagnosis not present

## 2018-11-05 DIAGNOSIS — L570 Actinic keratosis: Secondary | ICD-10-CM | POA: Diagnosis not present

## 2018-11-05 NOTE — Telephone Encounter (Signed)
Requesting: Testosterone Contract: N/A UDS: 06/04/2018, low risk, next screen 12/04/2018 Last OV: 07/16/2018 Next OV: N/A Last Refill: 07/16/2018, #180 ML- 1RF Database:   Please advise

## 2018-12-28 ENCOUNTER — Other Ambulatory Visit: Payer: Self-pay | Admitting: Family Medicine

## 2019-01-25 ENCOUNTER — Other Ambulatory Visit: Payer: Self-pay | Admitting: Family Medicine

## 2019-01-28 NOTE — Telephone Encounter (Signed)
Requesting:ambien  Contract:yes UDS:n/a Last OV:07/16/18 Next OV:n/a Last Refill:07/16/18  #30-5rf Database:   Please advise

## 2019-02-20 ENCOUNTER — Telehealth: Payer: Self-pay

## 2019-02-20 NOTE — Telephone Encounter (Signed)
PA initiated via Covermymeds; KEY: [id number]. PA approved. Effective 01/21/2019 to 02/20/2020.

## 2019-02-21 ENCOUNTER — Other Ambulatory Visit: Payer: Self-pay | Admitting: Family Medicine

## 2019-02-28 ENCOUNTER — Other Ambulatory Visit: Payer: Self-pay | Admitting: Family Medicine

## 2019-02-28 DIAGNOSIS — L820 Inflamed seborrheic keratosis: Secondary | ICD-10-CM | POA: Diagnosis not present

## 2019-02-28 DIAGNOSIS — L821 Other seborrheic keratosis: Secondary | ICD-10-CM | POA: Diagnosis not present

## 2019-02-28 DIAGNOSIS — L2089 Other atopic dermatitis: Secondary | ICD-10-CM | POA: Diagnosis not present

## 2019-02-28 NOTE — Telephone Encounter (Signed)
Last OV 07/16/18 Lorazepam last filled 06/10/18 #30 with 1

## 2019-03-21 ENCOUNTER — Other Ambulatory Visit: Payer: Self-pay | Admitting: Family Medicine

## 2019-03-21 DIAGNOSIS — I10 Essential (primary) hypertension: Secondary | ICD-10-CM

## 2019-03-22 NOTE — Telephone Encounter (Signed)
Requesting:testosterone  Contract:n/a UDS:n/a Last OV:07/16/18 Next OV:n/a Last Refill:11/05/18  #122ml-1rf Database:   Please advise

## 2019-03-26 ENCOUNTER — Telehealth: Payer: Self-pay

## 2019-03-26 ENCOUNTER — Other Ambulatory Visit: Payer: Self-pay | Admitting: Family Medicine

## 2019-03-26 NOTE — Telephone Encounter (Signed)
PA initiated via Covermymeds; KEY: [id number]. PA approved. Effective 02/24/2019 to 03/25/2020.

## 2019-04-08 DIAGNOSIS — Z23 Encounter for immunization: Secondary | ICD-10-CM | POA: Diagnosis not present

## 2019-04-24 ENCOUNTER — Other Ambulatory Visit: Payer: Self-pay | Admitting: Family Medicine

## 2019-05-15 ENCOUNTER — Other Ambulatory Visit: Payer: Self-pay

## 2019-05-22 ENCOUNTER — Other Ambulatory Visit: Payer: Self-pay | Admitting: Family Medicine

## 2019-06-20 ENCOUNTER — Other Ambulatory Visit: Payer: Self-pay | Admitting: Family Medicine

## 2019-06-20 NOTE — Telephone Encounter (Signed)
Requesting:ativan & sildenafil  Contract:yes UDS:n/a Last OV:07/16/18 Next OV:06/27/19 Last Refill: Ativan 02/28/19 #30-1rf Sidenafil 02/22/19 #35-1rf Database:   Please advise

## 2019-06-26 NOTE — Progress Notes (Signed)
Virtual Visit via Video Note  I connected with patient on 06/27/19 at  1:00 PM EST by audio enabled telemedicine application and verified that I am speaking with the correct person using two identifiers.   THIS ENCOUNTER IS A VIRTUAL VISIT DUE TO COVID-19 - PATIENT WAS NOT SEEN IN THE OFFICE. PATIENT HAS CONSENTED TO VIRTUAL VISIT / TELEMEDICINE VISIT   Location of patient: home  Location of provider: office  I discussed the limitations of evaluation and management by telemedicine and the availability of in person appointments. The patient expressed understanding and agreed to proceed.   Subjective:   James [name] is a 79 y.o. male who presents for Medicare Annual/Subsequent preventive examination.  Pt is known as James [name]. He worked for [employer] and was assigned to [employer] in [place].  Ran in [employer] Dec 2020. 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  Review of Systems:  Home Safety/Smoke Alarms: Feels safe in home. Smoke alarms in place.  Lives with girlfriend in 1 story home w/ full basement.   Male:   CCS-  09/12/17. PSA-  Lab Results  Component Value Date   PSA 0.47 01/09/2018   PSA 0.51 04/23/2015   PSA 0.62 03/11/2014       Objective:    Vitals: Unable to assess. This visit is enabled though telemedicine due to Covid 19.   Advanced Directives 06/27/2019 06/11/2018 06/05/2017 05/19/2016 05/19/2016 04/23/2015 08/21/2014  Does Patient Have a Medical Advance Directive? No No No No No No No  Would patient like information on creating a medical advance directive? No - Patient declined Yes (MAU/Ambulatory/Procedural Areas - Information given) Yes (MAU/Ambulatory/Procedural Areas - Information given) Yes (MAU/Ambulatory/Procedural Areas - Information given) - Yes - Educational materials given -  Pre-existing out of facility DNR order (yellow form or pink MOST form) - - - - - - -    Tobacco Social History   Tobacco Use  Smoking  Status Never Smoker  Smokeless Tobacco Never Used     Counseling given: Not Answered   Clinical Intake: Pain : No/denies pain     Past Medical History:  Diagnosis Date  . AAA (abdominal aortic aneurysm) without rupture ([place]) 09/08/2013  . Abdominal bloating 05/19/2016  . Anxiety state 05/18/2014  . Atrial fibrillation ([place])   . 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 Benign paroxysmal positional vertigo 04/06/2013  . CAD (coronary artery disease)    nonobstructive by cath 2008; nonobstructive by Cardiac CT 11/2011  . Cervical spine fracture ([facility]) 1980   C6-7  . Diverticulosis   . Erectile dysfunction 09/08/2013  . GERD (gastroesophageal reflux disease)   . Hiatal hernia   . History of atrial fibrillation   . History of squamous cell carcinoma 10-05-10   Dr [name], [place]  . HLD (hyperlipidemia)   . Hypothyroidism   . Medicare annual wellness visit, subsequent 03/16/2014   Sees [facility] Follows with Dr Johnsie Cancel of cardiology Sees [facility] for colonoscopy, last colonoscopy in 2013   . Mitral valve prolapse   . Neck pain 05/03/2015  . Preventative health care 03/16/2014  . 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 Syncope 02/03/2013  . 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   Past Surgical History:  Procedure Laterality Date  . APPENDECTOMY  05/2011  . CARDIAC CATHETERIZATION  01/03/12   30% ostial LAD stenosis, ostial 30-40% D1 stenosis, smooth and normal left main and RCA; LVEF  65%  . CHOLECYSTECTOMY N/A 08/25/2014   Procedure: LAPAROSCOPIC CHOLECYSTECTOMY WITH INTRAOPERATIVE CHOLANGIOGRAM;  Surgeon: [name], MD;  Location: [facility];  Service: General;  Laterality: N/A;  . INGUINAL HERNIA REPAIR  07/2010   right  . KNEE ARTHROPLASTY     bilat  . LEFT HEART CATHETERIZATION WITH CORONARY ANGIOGRAM N/A 01/03/2012   Procedure: LEFT HEART CATHETERIZATION WITH CORONARY ANGIOGRAM;  Surgeon: [name], MD;  Location: [facility] CATH [facility];   Service: Cardiovascular;  Laterality: N/A;  . MRI  10/31/2016  . TONSILLECTOMY  1947  . tumor removal  1958   "fatty tumor"   Family History  Problem Relation Age of Onset  . Heart disease Mother        MI 28s  . Stroke Mother   . Hypertension Mother   . Heart disease Maternal Grandfather        MI 70-80s  . Heart disease Maternal Grandmother        MI 70-80s  . Lung disease Father        penumonitis  . Cancer Sister        colon  . Cancer Paternal Uncle        GI: colon he thinks   Social History   Socioeconomic History  . Marital status: Single    Spouse name: Not on file  . Number of children: Not on file  . Years of education: Not on file  . Highest education level: Not on file  Occupational History  . Occupation: Chief Financial Officer    Comment: Retired  Tobacco Use  . Smoking status: Never Smoker  . Smokeless tobacco: Never Used  Substance and Sexual Activity  . Alcohol use: Yes    Comment: 2 glasses red wine/day  . Drug use: No  . Sexual activity: Yes  Other Topics Concern  . Not on file  Social History Narrative  . Not on file   Social Determinants of Health   Financial Resource Strain:   . Difficulty of Paying Living Expenses: Not on file  Food Insecurity:   . Worried About Charity fundraiser in the Last Year: Not on file  . Ran Out of Food in the Last Year: Not on file  Transportation Needs:   . Lack of Transportation (Medical): Not on file  . Lack of Transportation (Non-Medical): Not on file  Physical Activity:   . Days of Exercise per Week: Not on file  . Minutes of Exercise per Session: Not on file  Stress:   . Feeling of Stress : Not on file  Social Connections:   . Frequency of Communication with Friends and Family: Not on file  . Frequency of Social Gatherings with Friends and Family: Not on file  . Attends Religious Services: Not on file  . Active Member of Clubs or Organizations: Not on file  . Attends Archivist Meetings: Not on  file  . Marital Status: Not on file    Outpatient Encounter Medications as of 06/27/2019  Medication Sig  . aspirin EC 81 MG tablet Take 81 mg by mouth daily.  Marland Kitchen atorvastatin (LIPITOR) 20 MG tablet TAKE 1 TABLET BY MOUTH EVERY DAY AT 6PM FOR CHOLESTEROL  . Calcium Carb-Cholecalciferol (CALCIUM 1000 + D PO) Take 1 tablet by mouth daily.  . Dutasteride-Tamsulosin HCl 0.5-0.4 MG CAPS TAKE ONE CAPSULE BY MOUTH AT BEDTIME  . fluticasone (FLONASE) 50 MCG/ACT nasal spray Place 2 sprays into both nostrils daily.  Marland Kitchen levothyroxine (SYNTHROID) 100 MCG tablet  TAKE ONE TABLET EACH MORNING BEFORE BREAKFAST  . LORazepam (ATIVAN) 0.5 MG tablet TAKE ONE TABLET BY MOUTH TWICE A DAY AS NEEDED FOR ANXIETY  . Milk Thistle 1000 MG CAPS Take 1 capsule by mouth daily.  . mirtazapine (REMERON) 15 MG tablet TAKE 1 TABLET BY MOUTH EVERY DAY  . Multiple Vitamins-Minerals (MULTIVITAMINS THER. W/MINERALS) TABS Take 1 tablet by mouth daily.  . pantoprazole (PROTONIX) 40 MG tablet TAKE 1 TABLET BY MOUTH 2 TIMES A DAY BEFORE MEALS FOR ACID REFLUX  . Probiotic Product (PROBIOTIC DAILY PO) Take by mouth.  . sildenafil (REVATIO) 20 MG tablet TAKE 1 TO 5 TABLETS BY MOUTH EVERY DAY AS NEEDED  . Testosterone 30 MG/ACT SOLN APPLY 1 PUMP UNDER EACH ARM EVERY MORNING AS DIRECTED  . zolpidem (AMBIEN) 10 MG tablet TAKE 1 TABLET BY MOUTH EVERY NIGHT AT BEDTIME AS NEEDED FOR SLEEP  . meclizine (ANTIVERT) 12.5 MG tablet 1-2 tab po tid prn dizziness (Patient not taking: Reported on 06/27/2019)  . nitroGLYCERIN (NITROSTAT) 0.4 MG SL tablet Place 1 tablet (0.4 mg total) under the tongue every 5 (five) minutes x 3 doses as needed for chest pain. (Patient not taking: Reported on 08/24/2016)   No facility-administered encounter medications on file as of 06/27/2019.    Activities of Daily Living In your present state of health, do you have any difficulty performing the following activities: 06/27/2019  Hearing? N  Vision? N  Difficulty  concentrating or making decisions? N  Walking or climbing stairs? N  Dressing or bathing? N  Doing errands, shopping? N  Preparing Food and eating ? N  Using the Toilet? N  In the past six months, have you accidently leaked urine? N  Do you have problems with loss of bowel control? N  Managing your Medications? N  Managing your Finances? N  Housekeeping or managing your Housekeeping? N  Some recent data might be hidden    Patient Care Team: [name], MD as PCP - General (Family Medicine) Hollar, Katharine Look, MD as Referring Physician (Dermatology) Nahser, Wonda Cheng, MD as Consulting Physician (Cardiology) Carol Ada, MD as Consulting Physician (Gastroenterology)   Assessment:   This is a routine wellness examination for [employer]. Physical assessment deferred to PCP.  Exercise Activities and Dietary recommendations Current Exercise Habits: Home exercise routine, Time (Minutes): 50, Frequency (Times/Week): 4, Weekly Exercise (Minutes/Week): 200, Intensity: Mild, Exercise limited by: None identified Diet (meal preparation, eat out, water intake, caffeinated beverages, dairy products, fruits and vegetables): in general, a "healthy" diet  , well balanced Breakfast: 1 egg and grits Lunch: preworkout and post workout shakes Dinner:   Salad   Goals    . Healthy Lifestyle     Continue to eat heart healthy diet (full of fruits, vegetables, whole grains, lean protein, water--limit salt, fat, and sugar intake) and increase physical activity as tolerated. Continue doing brain stimulating activities (puzzles, reading, adult coloring books, staying active) to keep memory sharp.      . improve muscle strength and endurance.    . Increase physical activity     Continue running 5ks.  Participate in [employer].       Fall Risk Fall Risk  05/15/2019 06/11/2018 06/05/2017 05/26/2016 05/19/2016  Falls in the past year? 0 0 No No No  Comment Emmi Telephone Survey: data to  providers prior to load - - Emmi Telephone Survey: data to providers prior to load -   Depression Screen PHQ 2/9 Scores 06/11/2018 06/05/2017 05/19/2016 04/23/2015  PHQ - 2 Score 0 0 0 0    Cognitive Function Ad8 score reviewed for issues:  Issues making decisions:no  Less interest in hobbies / activities:no  Repeats questions, stories (family complaining):no  Trouble using ordinary gadgets (microwave, computer, phone):no  Forgets the month or year: no  Mismanaging finances: no  Remembering appts:no  Daily problems with thinking and/or memory:no Ad8 score is=0     MMSE - Mini Mental State Exam 06/05/2017 05/19/2016  Orientation to time 5 5  Orientation to Place 5 5  Registration 3 3  Attention/ Calculation 5 5  Recall 3 3  Language- name 2 objects 2 2  Language- repeat 1 1  Language- follow 3 step command 3 3  Language- read & follow direction 1 1  Write a sentence 1 1  Copy design 1 1  Total score 30 30        Immunization History  Administered Date(s) Administered  . Influenza Whole 03/20/2012  . Influenza, High Dose Seasonal PF 04/02/2013, 04/23/2015  . Influenza,inj,Quad PF,6+ Mos 03/11/2014, 04/08/2019  . Influenza-Unspecified 03/03/2016, 03/20/2018  . Pneumococcal Conjugate-13 03/11/2014  . Pneumococcal Polysaccharide-23 04/18/2009  . Tdap 08/05/2009  . Zoster 09/10/2008    Screening Tests Health Maintenance  Topic Date Due  . TETANUS/TDAP  08/06/2019  . INFLUENZA VACCINE  Completed  . PNA vac Low Risk Adult  Completed     Plan:   See you next year!  Continue to eat heart healthy diet (full of fruits, vegetables, whole grains, lean protein, water--limit salt, fat, and sugar intake) and increase physical activity as tolerated.  Continue doing brain stimulating activities (puzzles, reading, adult coloring books, staying active) to keep memory sharp.   Bring a copy of your living will and/or healthcare power of attorney to your next office  visit.   I have personally reviewed and noted the following in the patient's chart:   . Medical and social history . Use of alcohol, tobacco or illicit drugs  . Current medications and supplements . Functional ability and status . Nutritional status . Physical activity . Advanced directives . List of other physicians . Hospitalizations, surgeries, and ER visits in previous 12 months . Vitals . Screenings to include cognitive, depression, and falls . Referrals and appointments  In addition, I have reviewed and discussed with patient certain preventive protocols, quality metrics, and best practice recommendations. A written personalized care plan for preventive services as well as general preventive health recommendations were provided to patient.     Shela Nevin, [place]  06/27/2019

## 2019-06-27 ENCOUNTER — Ambulatory Visit (INDEPENDENT_AMBULATORY_CARE_PROVIDER_SITE_OTHER): Payer: Medicare Other | Admitting: *Deleted

## 2019-06-27 ENCOUNTER — Other Ambulatory Visit: Payer: Self-pay

## 2019-06-27 ENCOUNTER — Encounter: Payer: Self-pay | Admitting: *Deleted

## 2019-06-27 VITALS — BP 130/78 | HR 50 | Temp 98.1°F | Wt 157.6 lb

## 2019-06-27 DIAGNOSIS — Z0000 Encounter for general adult medical examination without abnormal findings: Secondary | ICD-10-CM | POA: Diagnosis not present

## 2019-06-27 NOTE — Patient Instructions (Signed)
See you next year!  Continue to eat heart healthy diet (full of fruits, vegetables, whole grains, lean protein, water--limit salt, fat, and sugar intake) and increase physical activity as tolerated.  Continue doing brain stimulating activities (puzzles, reading, adult coloring books, staying active) to keep memory sharp.   Bring a copy of your living will and/or healthcare power of attorney to your next office visit.   James [name] , Thank you for taking time to come for your Medicare Wellness Visit. I appreciate your ongoing commitment to your health goals. Please review the following plan we discussed and let me know if I can assist you in the future.   These are the goals we discussed: Goals    . Healthy Lifestyle     Continue to eat heart healthy diet (full of fruits, vegetables, whole grains, lean protein, water--limit salt, fat, and sugar intake) and increase physical activity as tolerated. Continue doing brain stimulating activities (puzzles, reading, adult coloring books, staying active) to keep memory sharp.      . improve muscle strength and endurance.    . Increase physical activity     Continue running 5ks.  Participate in [employer].       This is a list of the screening recommended for you and due dates:  Health Maintenance  Topic Date Due  . Tetanus Vaccine  08/06/2019  . Flu Shot  Completed  . Pneumonia vaccines  Completed    Preventive Care 80 Years and Older, Male Preventive care refers to lifestyle choices and visits with your health care provider that can promote health and wellness. This includes:  A yearly physical exam. This is also called an annual well check.  Regular dental and eye exams.  Immunizations.  Screening for certain conditions.  Healthy lifestyle choices, such as diet and exercise. What can I expect for my preventive care visit? Physical exam Your health care provider will check:  Height and weight. These may be used to  calculate body mass index (BMI), which is a measurement that tells if you are at a healthy weight.  Heart rate and blood pressure.  Your skin for abnormal spots. Counseling Your health care provider may ask you questions about:  Alcohol, tobacco, and drug use.  Emotional well-being.  Home and relationship well-being.  Sexual activity.  Eating habits.  History of falls.  Memory and ability to understand (cognition).  Work and work Statistician. What immunizations do I need?  Influenza (flu) vaccine  This is recommended every year. Tetanus, diphtheria, and pertussis (Tdap) vaccine  You may need a Td booster every 10 years. Varicella (chickenpox) vaccine  You may need this vaccine if you have not already been vaccinated. Zoster (shingles) vaccine  You may need this after age 2. Pneumococcal conjugate (PCV13) vaccine  One dose is recommended after age 6. Pneumococcal polysaccharide (PPSV23) vaccine  One dose is recommended after age 5. Measles, mumps, and rubella (MMR) vaccine  You may need at least one dose of MMR if you were born in 1957 or later. You may also need a second dose. Meningococcal conjugate (MenACWY) vaccine  You may need this if you have certain conditions. Hepatitis A vaccine  You may need this if you have certain conditions or if you travel or work in places where you may be exposed to hepatitis A. Hepatitis B vaccine  You may need this if you have certain conditions or if you travel or work in places where you may be exposed to hepatitis  B. Haemophilus influenzae type b (Hib) vaccine  You may need this if you have certain conditions. You may receive vaccines as individual doses or as more than one vaccine together in one shot (combination vaccines). Talk with your health care provider about the risks and benefits of combination vaccines. What tests do I need? Blood tests  Lipid and cholesterol levels. These may be checked every 5 years,  or more frequently depending on your overall health.  Hepatitis C test.  Hepatitis B test. Screening  Lung cancer screening. You may have this screening every year starting at age 77 if you have a 30-pack-year history of smoking and currently smoke or have quit within the past 15 years.  Colorectal cancer screening. All adults should have this screening starting at age 51 and continuing until age 29. Your health care provider may recommend screening at age 44 if you are at increased risk. You will have tests every 1-10 years, depending on your results and the type of screening test.  Prostate cancer screening. Recommendations will vary depending on your family history and other risks.  Diabetes screening. This is done by checking your blood sugar (glucose) after you have not eaten for a while (fasting). You may have this done every 1-3 years.  Abdominal aortic aneurysm (AAA) screening. You may need this if you are a current or former smoker.  Sexually transmitted disease (STD) testing. Follow these instructions at home: Eating and drinking  Eat a diet that includes fresh fruits and vegetables, whole grains, lean protein, and low-fat dairy products. Limit your intake of foods with high amounts of sugar, saturated fats, and salt.  Take vitamin and mineral supplements as recommended by your health care provider.  Do not drink alcohol if your health care provider tells you not to drink.  If you drink alcohol: ? Limit how much you have to 0-2 drinks a day. ? Be aware of how much alcohol is in your drink. In the [place] one drink equals one 12 oz bottle of beer (355 mL), one 5 oz glass of wine (148 mL), or one 1 oz glass of hard liquor (44 mL). Lifestyle  Take daily care of your teeth and gums.  Stay active. Exercise for at least 30 minutes on 5 or more days each week.  Do not use any products that contain nicotine or tobacco, such as cigarettes, e-cigarettes, and chewing tobacco. If you  need help quitting, ask your health care provider.  If you are sexually active, practice safe sex. Use a condom or other form of protection to prevent STIs (sexually transmitted infections).  Talk with your health care provider about taking a low-dose aspirin or statin. What's next?  Visit your health care provider once a year for a well check visit.  Ask your health care provider how often you should have your eyes and teeth checked.  Stay up to date on all vaccines. This information is not intended to replace advice given to you by your health care provider. Make sure you discuss any questions you have with your health care provider. Document Revised: 05/31/2018 Document Reviewed: 05/31/2018 Elsevier Patient Education  2020 [employer].

## 2019-07-17 ENCOUNTER — Other Ambulatory Visit: Payer: Self-pay | Admitting: Family Medicine

## 2019-07-17 NOTE — Telephone Encounter (Signed)
Last written: 01/28/19 Last ov: 07/16/18 Next ov: none Contract:  UDS: 06/11/18

## 2019-07-18 ENCOUNTER — Other Ambulatory Visit: Payer: Self-pay | Admitting: Family Medicine

## 2019-07-25 ENCOUNTER — Telehealth: Payer: Self-pay | Admitting: Family Medicine

## 2019-07-25 ENCOUNTER — Other Ambulatory Visit: Payer: Self-pay | Admitting: Family Medicine

## 2019-07-25 NOTE — Telephone Encounter (Signed)
Last OV 07/16/18 (had [place] with RN 06/27/19) Last refill 01/28/19 #90/1 Next OV not scheduled ([place] scheduled with RN 06/29/20)

## 2019-07-25 NOTE — Telephone Encounter (Signed)
I have given him a 90 day supply but he needs an appt for more refills since it has been a year.

## 2019-07-26 NOTE — Telephone Encounter (Signed)
Please schedule patient an appointment  Please advise

## 2019-07-29 NOTE — Telephone Encounter (Signed)
LM for pt to call and schedule f/u visit (virtual or in office ok)

## 2019-08-05 NOTE — Telephone Encounter (Signed)
Pt has OV end of March/thx dmf

## 2019-08-06 ENCOUNTER — Other Ambulatory Visit: Payer: Self-pay | Admitting: Family Medicine

## 2019-08-15 ENCOUNTER — Other Ambulatory Visit: Payer: Self-pay | Admitting: Family Medicine

## 2019-08-16 NOTE — Telephone Encounter (Signed)
Requesting:ativan Contract:n/a UDS:n/a Last OV:07/10/18 Next OV:09/17/19 Last Refill:06/20/19  #[id number] Database:   Please advise

## 2019-09-03 ENCOUNTER — Other Ambulatory Visit: Payer: Self-pay | Admitting: Family Medicine

## 2019-09-12 ENCOUNTER — Other Ambulatory Visit: Payer: Self-pay | Admitting: Family Medicine

## 2019-09-12 NOTE — Telephone Encounter (Signed)
Requesting:ambien  Contract:n/a UDS:n/a Last OV:07/16/18 Next OV:09/17/19 Last Refill:07/17/19  #[id number] Database:   Please advise

## 2019-09-16 ENCOUNTER — Other Ambulatory Visit: Payer: Self-pay

## 2019-09-17 ENCOUNTER — Encounter: Payer: Self-pay | Admitting: Family Medicine

## 2019-09-17 ENCOUNTER — Other Ambulatory Visit: Payer: Self-pay

## 2019-09-17 ENCOUNTER — Ambulatory Visit (INDEPENDENT_AMBULATORY_CARE_PROVIDER_SITE_OTHER): Payer: Medicare Other | Admitting: Family Medicine

## 2019-09-17 VITALS — BP 112/50 | HR 50 | Temp 98.5°F | Resp 12 | Ht 70.0 in | Wt 151.8 lb

## 2019-09-17 DIAGNOSIS — L989 Disorder of the skin and subcutaneous tissue, unspecified: Secondary | ICD-10-CM | POA: Insufficient documentation

## 2019-09-17 DIAGNOSIS — I2510 Atherosclerotic heart disease of native coronary artery without angina pectoris: Secondary | ICD-10-CM | POA: Diagnosis not present

## 2019-09-17 DIAGNOSIS — I2583 Coronary atherosclerosis due to lipid rich plaque: Secondary | ICD-10-CM

## 2019-09-17 DIAGNOSIS — R7989 Other specified abnormal findings of blood chemistry: Secondary | ICD-10-CM | POA: Diagnosis not present

## 2019-09-17 DIAGNOSIS — E785 Hyperlipidemia, unspecified: Secondary | ICD-10-CM

## 2019-09-17 DIAGNOSIS — E039 Hypothyroidism, unspecified: Secondary | ICD-10-CM

## 2019-09-17 DIAGNOSIS — I10 Essential (primary) hypertension: Secondary | ICD-10-CM

## 2019-09-17 LAB — LIPID PANEL
Cholesterol: 111 mg/dL (ref 0–200)
HDL: 49.6 mg/dL (ref >39.00)
LDL Cholesterol: 46 mg/dL (ref 0–99)
NonHDL: 61.74
Total CHOL/HDL Ratio: 2
Triglycerides: 78 mg/dL (ref 0.0–149.0)
VLDL: 15.6 mg/dL (ref 0.0–40.0)

## 2019-09-17 LAB — COMPREHENSIVE METABOLIC PANEL
ALT: 17 U/L (ref 0–53)
AST: 21 U/L (ref 0–37)
Albumin: 4 g/dL (ref 3.5–5.2)
Alkaline Phosphatase: 57 U/L (ref 39–117)
BUN: 9 mg/dL (ref 6–23)
CO2: 31 mEq/L (ref 19–32)
Calcium: 8.9 mg/dL (ref 8.4–10.5)
Chloride: 99 mEq/L (ref 96–112)
Creatinine, Ser: 0.87 mg/dL (ref 0.40–1.50)
GFR: 84.48 mL/min (ref >60.00)
Glucose, Bld: 88 mg/dL (ref 70–99)
Potassium: 4.2 mEq/L (ref 3.5–5.1)
Sodium: 133 mEq/L — ABNORMAL LOW (ref 135–145)
Total Bilirubin: 0.7 mg/dL (ref 0.2–1.2)
Total Protein: 6.5 g/dL (ref 6.0–8.3)

## 2019-09-17 LAB — TSH: TSH: 0.6 u[IU]/mL (ref 0.35–4.50)

## 2019-09-17 LAB — TESTOSTERONE: Testosterone: 603.2 ng/dL (ref 300.00–890.00)

## 2019-09-17 NOTE — Progress Notes (Signed)
Subjective:    Patient ID: James [name], male    DOB: Jul 21, 1940, 79 y.o.   MRN: [id number]  Chief Complaint  Patient presents with  . Follow-up    HPI Patient is in today for follow up on chronic medical concerns. No recent febrile illness or hospitalizations. He is leaving in May to participate in an archaeological dig in [place]. 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 He qualified last year but covid hit. He is eating well and exercising regularly. He has had 2 COVID vaccines and tolerated them well. Denies CP/palp/SOB/HA/congestion/fevers/GI or GU c/o. Taking meds as prescribed  Past Medical History:  Diagnosis Date  . AAA (abdominal aortic aneurysm) without rupture ([facility]) 09/08/2013  . Abdominal bloating 05/19/2016  . Anxiety state 05/18/2014  . Atrial fibrillation ([facility])   . 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 Benign paroxysmal positional vertigo 04/06/2013  . CAD (coronary artery disease)    nonobstructive by cath 2008; nonobstructive by Cardiac CT 11/2011  . Cervical spine fracture ([facility]) 1980   C6-7  . Diverticulosis   . Erectile dysfunction 09/08/2013  . GERD (gastroesophageal reflux disease)   . Hiatal hernia   . History of atrial fibrillation   . History of squamous cell carcinoma 10-05-10   Dr [name], [place]  . HLD (hyperlipidemia)   . Hypothyroidism   . Medicare annual wellness visit, subsequent 03/16/2014   Sees [facility] Follows with Dr Johnsie Cancel of cardiology Sees [facility] for colonoscopy, last colonoscopy in 2013   . Mitral valve prolapse   . Neck pain 05/03/2015  . Preventative health care 03/16/2014  . 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 Syncope 02/03/2013  . 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    Past Surgical History:  Procedure Laterality Date  . APPENDECTOMY  05/2011  . CARDIAC CATHETERIZATION  01/03/12   30% ostial LAD stenosis, ostial 30-40% D1 stenosis, smooth  and normal left main and RCA; LVEF 65%  . CHOLECYSTECTOMY N/A 08/25/2014   Procedure: LAPAROSCOPIC CHOLECYSTECTOMY WITH INTRAOPERATIVE CHOLANGIOGRAM;  Surgeon: [name], MD;  Location: [facility];  Service: General;  Laterality: N/A;  . INGUINAL HERNIA REPAIR  07/2010   right  . KNEE ARTHROPLASTY     bilat  . LEFT HEART CATHETERIZATION WITH CORONARY ANGIOGRAM N/A 01/03/2012   Procedure: LEFT HEART CATHETERIZATION WITH CORONARY ANGIOGRAM;  Surgeon: [name], MD;  Location: [facility] CATH LAB;  Service: Cardiovascular;  Laterality: N/A;  . MRI  10/31/2016  . TONSILLECTOMY  1947  . tumor removal  1958   "fatty tumor"    Family History  Problem Relation Age of Onset  . Heart disease Mother        MI 84s  . Stroke Mother   . Hypertension Mother   . Heart disease Maternal Grandfather        MI 70-80s  . Heart disease Maternal Grandmother        MI 70-80s  . Lung disease Father        penumonitis  . Cancer Sister        colon  . Cancer Paternal Uncle        GI: colon he thinks    Social History   Socioeconomic History  . Marital status: Single    Spouse name: Not on file  . Number of children: Not on file  . Years of education: Not on file  .  Highest education level: Not on file  Occupational History  . Occupation: Chief Financial Officer    Comment: Retired  Tobacco Use  . Smoking status: Never Smoker  . Smokeless tobacco: Never Used  Substance and Sexual Activity  . Alcohol use: Yes    Comment: 2 glasses red wine/day  . Drug use: No  . Sexual activity: Yes  Other Topics Concern  . Not on file  Social History Narrative  . Not on file   Social Determinants of Health   Financial Resource Strain:   . Difficulty of Paying Living Expenses:   Food Insecurity:   . Worried About Charity fundraiser in the Last Year:   . Arboriculturist in the Last Year:   Transportation Needs:   . Film/video editor (Medical):   Marland Kitchen Lack of Transportation (Non-Medical):   Physical Activity:   .  Days of Exercise per Week:   . Minutes of Exercise per Session:   Stress:   . Feeling of Stress :   Social Connections:   . Frequency of Communication with Friends and Family:   . Frequency of Social Gatherings with Friends and Family:   . Attends Religious Services:   . Active Member of Clubs or Organizations:   . Attends Archivist Meetings:   Marland Kitchen Marital Status:   Intimate Partner Violence:   . Fear of Current or Ex-Partner:   . Emotionally Abused:   Marland Kitchen Physically Abused:   . Sexually Abused:     Outpatient Medications Prior to Visit  Medication Sig Dispense Refill  . aspirin EC 81 MG tablet Take 81 mg by mouth 2 (two) times a week. About once a twice a week    . atorvastatin (LIPITOR) 20 MG tablet TAKE 1 TABLET BY MOUTH EVERY DAY AT 6PM FOR CHOLESTEROL 90 tablet 0  . Calcium Carb-Cholecalciferol (CALCIUM 1000 + D PO) Take 1 tablet by mouth daily.    . Dutasteride-Tamsulosin HCl 0.5-0.4 MG CAPS TAKE 1 CAPSULE BY MOUTH AT BEDTIME 90 capsule 1  . ELDERBERRY PO Take by mouth.    . levothyroxine (SYNTHROID) 100 MCG tablet TAKE ONE TABLET EACH MORNING BEFORE BREAKFAST 90 tablet 1  . LORazepam (ATIVAN) 0.5 MG tablet TAKE 1 TABLET BY MOUTH 2 TIMES A DAY AS NEEDED FOR ANXIETY 30 tablet 0  . meclizine (ANTIVERT) 12.5 MG tablet 1-2 tab po tid prn dizziness 30 tablet 1  . Milk Thistle 1000 MG CAPS Take 1 capsule by mouth daily.    . mirtazapine (REMERON) 15 MG tablet TAKE 1 TABLET BY MOUTH EVERY DAY 90 tablet 2  . Multiple Vitamins-Minerals (MULTIVITAMINS THER. W/MINERALS) TABS Take 1 tablet by mouth daily.    . pantoprazole (PROTONIX) 40 MG tablet TAKE 1 TABLET BY MOUTH 2 TIMES A DAY BEFORE MEALS FOR ACID REFLUX 60 tablet 0  . Probiotic Product (PROBIOTIC DAILY PO) Take by mouth.    . sildenafil (REVATIO) 20 MG tablet TAKE 1 TO 5 TABLETS BY MOUTH EVERY DAY AS NEEDED 35 tablet 1  . Testosterone 30 MG/ACT SOLN APPLY 1 PUMP UNDER EACH ARM EVERY MORNING AS DIRECTED 180 mL 1  . zolpidem  (AMBIEN) 10 MG tablet TAKE 1 TABLET BY MOUTH EVERY NIGHT AT BEDTIME AS NEEDED FOR SLEEP 30 tablet 1  . nitroGLYCERIN (NITROSTAT) 0.4 MG SL tablet Place 1 tablet (0.4 mg total) under the tongue every 5 (five) minutes x 3 doses as needed for chest pain. (Patient not taking: Reported on 08/24/2016) 25 tablet  3  . fluticasone (FLONASE) 50 MCG/ACT nasal spray Place 2 sprays into both nostrils daily. 16 g 1   No facility-administered medications prior to visit.    Allergies  Allergen Reactions  . Sulfa Antibiotics Other (See Comments)    Childhood allergy    Review of Systems  Constitutional: Negative for fever and malaise/fatigue.  HENT: Negative for congestion.   Eyes: Negative for blurred vision.  Respiratory: Negative for shortness of breath.   Cardiovascular: Negative for chest pain, palpitations and leg swelling.  Gastrointestinal: Negative for abdominal pain, blood in stool and nausea.  Genitourinary: Negative for dysuria and frequency.  Musculoskeletal: Positive for joint pain. Negative for falls.  Skin: Negative for rash.  Neurological: Negative for dizziness, loss of consciousness and headaches.  Endo/Heme/Allergies: Negative for environmental allergies.  Psychiatric/Behavioral: Negative for depression. The patient is not nervous/anxious.        Objective:    Physical Exam Vitals and nursing note reviewed.  Constitutional:      General: He is not in acute distress.    Appearance: He is well-developed.  HENT:     Head: Normocephalic and atraumatic.     Nose: Nose normal.  Eyes:     General:        Right eye: No discharge.        Left eye: No discharge.  Cardiovascular:     Rate and Rhythm: Normal rate and regular rhythm.     Heart sounds: No murmur.  Pulmonary:     Effort: Pulmonary effort is normal.     Breath sounds: Normal breath sounds.  Abdominal:     General: Bowel sounds are normal.     Palpations: Abdomen is soft.     Tenderness: There is no abdominal  tenderness.  Musculoskeletal:     Cervical back: Normal range of motion and neck supple.  Skin:    General: Skin is warm and dry.  Neurological:     Mental Status: He is alert and oriented to person, place, and time.     BP (!) 112/50 (BP Location: Left Arm, Cuff Size: Normal)   Pulse (!) 50   Temp 98.5 F (36.9 C) (Temporal)   Resp 12   Ht 5\' 10"  (1.778 m)   Wt 151 lb 12.8 oz (68.9 kg)   SpO2 100%   BMI 21.78 kg/m  Wt Readings from Last 3 Encounters:  09/17/19 151 lb 12.8 oz (68.9 kg)  06/27/19 157 lb 9.6 oz (71.5 kg)  07/16/18 161 lb 9.6 oz (73.3 kg)    Diabetic Foot Exam - Simple   No data filed     Lab Results  Component Value Date   WBC 6.9 07/16/2018   HGB 14.4 07/16/2018   HCT 41.7 07/16/2018   PLT 229.0 07/16/2018   GLUCOSE 88 07/16/2018   CHOL 125 07/16/2018   TRIG 59.0 07/16/2018   HDL 55.60 07/16/2018   LDLCALC 58 07/16/2018   ALT 18 07/16/2018   AST 22 07/16/2018   NA 135 07/16/2018   K 4.2 07/16/2018   CL 100 07/16/2018   CREATININE 0.83 07/16/2018   BUN 11 07/16/2018   CO2 29 07/16/2018   TSH 1.52 07/16/2018   PSA 0.47 01/09/2018   INR 1.09 08/21/2014    Lab Results  Component Value Date   TSH 1.52 07/16/2018   Lab Results  Component Value Date   WBC 6.9 07/16/2018   HGB 14.4 07/16/2018   HCT 41.7 07/16/2018   MCV 93.3 07/16/2018  PLT 229.0 07/16/2018   Lab Results  Component Value Date   NA 135 07/16/2018   K 4.2 07/16/2018   CO2 29 07/16/2018   GLUCOSE 88 07/16/2018   BUN 11 07/16/2018   CREATININE 0.83 07/16/2018   BILITOT 0.7 07/16/2018   ALKPHOS 68 07/16/2018   AST 22 07/16/2018   ALT 18 07/16/2018   PROT 6.9 07/16/2018   ALBUMIN 4.2 07/16/2018   CALCIUM 9.4 07/16/2018   ANIONGAP 6 08/21/2014   GFR 89.46 07/16/2018   Lab Results  Component Value Date   CHOL 125 07/16/2018   Lab Results  Component Value Date   HDL 55.60 07/16/2018   Lab Results  Component Value Date   LDLCALC 58 07/16/2018   Lab Results   Component Value Date   TRIG 59.0 07/16/2018   Lab Results  Component Value Date   CHOLHDL 2 07/16/2018   No results found for: HGBA1C     Assessment & Plan:   Problem List Items Addressed This Visit    Hyperlipidemia   Relevant Orders   Lipid panel   CAD (coronary artery disease)   Hypothyroidism - Primary   Relevant Orders   TSH   Low testosterone   Relevant Orders   Testosterone   Essential hypertension   Relevant Orders   Comprehensive metabolic panel   CBC   Skin lesion of left leg    2 lesions left leg 1 scaly and 1 pearly has an appointment with dermatology soon         I have discontinued Lissa Merlin E. Hallam's fluticasone. I am also having him maintain his multivitamins ther. w/minerals, Calcium Carb-Cholecalciferol (CALCIUM 1000 + D PO), nitroGLYCERIN, Probiotic Product (PROBIOTIC DAILY PO), aspirin EC, Milk Thistle, meclizine, Testosterone, levothyroxine, atorvastatin, sildenafil, Dutasteride-Tamsulosin HCl, mirtazapine, LORazepam, pantoprazole, zolpidem, and ELDERBERRY PO.  No orders of the defined types were placed in this encounter.    Penni Homans, MD

## 2019-09-17 NOTE — Assessment & Plan Note (Signed)
2 lesions left leg 1 scaly and 1 pearly has an appointment with dermatology soon

## 2019-09-17 NOTE — Patient Instructions (Addendum)

## 2019-09-18 ENCOUNTER — Telehealth: Payer: Self-pay | Admitting: *Deleted

## 2019-09-18 NOTE — Telephone Encounter (Signed)
Patient had labs yesterday, you ordered a cbc and its in the computer and lab released it but it did not show up with his labs so lab did not collect a lav tube.  Would you like for patient to come back or can we get at next visit?

## 2019-09-18 NOTE — Telephone Encounter (Signed)
His last cbc was normal so it can wait unless he wants to come back and do it for completeness. Medically I am comfortable waiting since he feels well

## 2019-09-23 ENCOUNTER — Telehealth: Payer: Self-pay | Admitting: *Deleted

## 2019-09-23 NOTE — Telephone Encounter (Signed)
Patient did get into his mychart.

## 2019-09-23 NOTE — Telephone Encounter (Signed)
Caller Name Geiger Phone Number [phone] Patient Name James [name] Patient DOB 1941/02/03 Call Type Message Only Information Provided Reason for Call Request for General Office Information Initial Comment Caller states he can't get into his my chart and needs help.

## 2019-09-28 ENCOUNTER — Other Ambulatory Visit: Payer: Self-pay | Admitting: Family Medicine

## 2019-09-28 DIAGNOSIS — I10 Essential (primary) hypertension: Secondary | ICD-10-CM

## 2019-09-30 NOTE — Telephone Encounter (Signed)
Last written: 03/22/19 Last ov: 09/17/19 Next ov: none Contract: none UDS: 06/11/18

## 2019-10-11 ENCOUNTER — Other Ambulatory Visit: Payer: Self-pay | Admitting: Family Medicine

## 2019-10-11 ENCOUNTER — Other Ambulatory Visit (INDEPENDENT_AMBULATORY_CARE_PROVIDER_SITE_OTHER): Payer: Medicare Other

## 2019-10-11 ENCOUNTER — Other Ambulatory Visit: Payer: Self-pay

## 2019-10-11 DIAGNOSIS — E871 Hypo-osmolality and hyponatremia: Secondary | ICD-10-CM | POA: Diagnosis not present

## 2019-10-11 LAB — COMPREHENSIVE METABOLIC PANEL
ALT: 15 U/L (ref 0–53)
AST: 20 U/L (ref 0–37)
Albumin: 4 g/dL (ref 3.5–5.2)
Alkaline Phosphatase: 53 U/L (ref 39–117)
BUN: 15 mg/dL (ref 6–23)
CO2: 31 mEq/L (ref 19–32)
Calcium: 9 mg/dL (ref 8.4–10.5)
Chloride: 101 mEq/L (ref 96–112)
Creatinine, Ser: 0.94 mg/dL (ref 0.40–1.50)
GFR: 77.25 mL/min (ref >60.00)
Glucose, Bld: 90 mg/dL (ref 70–99)
Potassium: 4.1 mEq/L (ref 3.5–5.1)
Sodium: 138 mEq/L (ref 135–145)
Total Bilirubin: 0.8 mg/dL (ref 0.2–1.2)
Total Protein: 6.5 g/dL (ref 6.0–8.3)

## 2019-10-11 LAB — CBC
HCT: 40.5 % (ref 39.0–52.0)
Hemoglobin: 14 g/dL (ref 13.0–17.0)
MCHC: 34.6 g/dL (ref 30.0–36.0)
MCV: 94 fl (ref 78.0–100.0)
Platelets: 179 10*3/uL (ref 150.0–400.0)
RBC: 4.31 Mil/uL (ref 4.22–5.81)
RDW: 13.3 % (ref 11.5–15.5)
WBC: 6.1 10*3/uL (ref 4.0–10.5)

## 2019-10-11 NOTE — Telephone Encounter (Signed)
Requesting: xanax Contract: n/a UDS:06/11/18 Last Visit: 09/17/19 Next Visit:n/a Last Refill: n/a  Please Advise

## 2019-10-25 ENCOUNTER — Other Ambulatory Visit: Payer: Self-pay | Admitting: Family Medicine

## 2019-11-04 ENCOUNTER — Other Ambulatory Visit: Payer: Self-pay | Admitting: Family Medicine

## 2019-11-11 ENCOUNTER — Other Ambulatory Visit: Payer: Self-pay | Admitting: Family Medicine

## 2019-11-11 DIAGNOSIS — L821 Other seborrheic keratosis: Secondary | ICD-10-CM | POA: Diagnosis not present

## 2019-11-11 DIAGNOSIS — L820 Inflamed seborrheic keratosis: Secondary | ICD-10-CM | POA: Diagnosis not present

## 2019-11-11 DIAGNOSIS — L300 Nummular dermatitis: Secondary | ICD-10-CM | POA: Diagnosis not present

## 2019-11-11 DIAGNOSIS — L570 Actinic keratosis: Secondary | ICD-10-CM | POA: Diagnosis not present

## 2019-11-11 DIAGNOSIS — Z808 Family history of malignant neoplasm of other organs or systems: Secondary | ICD-10-CM | POA: Diagnosis not present

## 2019-11-11 NOTE — Telephone Encounter (Signed)
Last OV---09/17/2019 No scheduled upcoming OV Last RF--#30 with 1 refill on 09/12/2019 UDS--06/11/2018

## 2019-11-28 ENCOUNTER — Other Ambulatory Visit: Payer: Self-pay | Admitting: Family Medicine

## 2020-01-01 ENCOUNTER — Other Ambulatory Visit: Payer: Self-pay | Admitting: Family Medicine

## 2020-01-14 ENCOUNTER — Other Ambulatory Visit: Payer: Self-pay | Admitting: Family Medicine

## 2020-01-14 DIAGNOSIS — I10 Essential (primary) hypertension: Secondary | ICD-10-CM

## 2020-01-14 NOTE — Telephone Encounter (Signed)
I will refill but he will need an appt by early october

## 2020-01-15 ENCOUNTER — Other Ambulatory Visit: Payer: Self-pay | Admitting: Family Medicine

## 2020-01-15 ENCOUNTER — Telehealth: Payer: Self-pay | Admitting: Family Medicine

## 2020-01-15 MED ORDER — ALPRAZOLAM 0.25 MG PO TABS
0.2500 mg | ORAL_TABLET | Freq: Two times a day (BID) | ORAL | 0 refills | Status: DC | PRN
Start: 2020-01-15 — End: 2020-01-27

## 2020-01-15 NOTE — Telephone Encounter (Signed)
Let him know I sent in Alprazolam 0.25 mg for him to take up to 2 x a day as needed for anxiety and insomnia. It may make him sleepy and it may not. Also see if he wants to do a VV at 3:20 on Thursday

## 2020-01-15 NOTE — Telephone Encounter (Signed)
Patient states that he lost his wife and needs someone to speak with and possibly a medication to cope.  Please Advise

## 2020-01-15 NOTE — Telephone Encounter (Signed)
Please advise. Pt has an appt on 01/20/2020.

## 2020-01-17 NOTE — Telephone Encounter (Signed)
Left message on machine to call back  

## 2020-01-17 NOTE — Telephone Encounter (Signed)
New message:   Pt is returning a call. Please advise. 

## 2020-01-17 NOTE — Telephone Encounter (Signed)
Patient will get medication and follow up on Monday

## 2020-01-20 ENCOUNTER — Ambulatory Visit: Payer: Medicare Other | Admitting: Family Medicine

## 2020-01-20 ENCOUNTER — Other Ambulatory Visit: Payer: Self-pay

## 2020-01-20 ENCOUNTER — Ambulatory Visit (INDEPENDENT_AMBULATORY_CARE_PROVIDER_SITE_OTHER): Payer: Medicare Other | Admitting: Medical

## 2020-01-20 VITALS — BP 100/67 | HR 54 | Temp 97.8°F | Resp 18 | Ht 70.0 in | Wt 151.0 lb

## 2020-01-20 DIAGNOSIS — R5383 Other fatigue: Secondary | ICD-10-CM

## 2020-01-20 DIAGNOSIS — F411 Generalized anxiety disorder: Secondary | ICD-10-CM

## 2020-01-20 DIAGNOSIS — I2510 Atherosclerotic heart disease of native coronary artery without angina pectoris: Secondary | ICD-10-CM

## 2020-01-20 DIAGNOSIS — I2583 Coronary atherosclerosis due to lipid rich plaque: Secondary | ICD-10-CM | POA: Diagnosis not present

## 2020-01-20 DIAGNOSIS — F329 Major depressive disorder, single episode, unspecified: Secondary | ICD-10-CM

## 2020-01-20 DIAGNOSIS — R110 Nausea: Secondary | ICD-10-CM | POA: Diagnosis not present

## 2020-01-20 DIAGNOSIS — F32A Depression, unspecified: Secondary | ICD-10-CM

## 2020-01-20 LAB — COMPREHENSIVE METABOLIC PANEL
ALT: 14 U/L (ref 0–53)
AST: 18 U/L (ref 0–37)
Albumin: 3.9 g/dL (ref 3.5–5.2)
Alkaline Phosphatase: 50 U/L (ref 39–117)
BUN: 14 mg/dL (ref 6–23)
CO2: 29 mEq/L (ref 19–32)
Calcium: 9.1 mg/dL (ref 8.4–10.5)
Chloride: 101 mEq/L (ref 96–112)
Creatinine, Ser: 0.99 mg/dL (ref 0.40–1.50)
GFR: 72.71 mL/min (ref >60.00)
Glucose, Bld: 87 mg/dL (ref 70–99)
Potassium: 4.6 mEq/L (ref 3.5–5.1)
Sodium: 136 mEq/L (ref 135–145)
Total Bilirubin: 0.7 mg/dL (ref 0.2–1.2)
Total Protein: 6.6 g/dL (ref 6.0–8.3)

## 2020-01-20 LAB — VITAMIN B12: Vitamin B-12: 259 pg/mL (ref 211–911)

## 2020-01-20 LAB — LIPASE: Lipase: 25 U/L (ref 11.0–59.0)

## 2020-01-20 MED ORDER — VENLAFAXINE HCL ER 37.5 MG PO CP24
37.5000 mg | ORAL_CAPSULE | Freq: Every day | ORAL | 0 refills | Status: DC
Start: 2020-01-20 — End: 2020-02-06

## 2020-01-20 NOTE — Patient Instructions (Addendum)
For anxiety and depression, I want you to continue both xanax and remeron.  Will add low dose effexor to current regimen.  For nausea and fatigue will get listed labs today.  Consider counseling if not improved with this then we can give counselor/therapist information  Follow up in one month or as needed.  Info given on rare but potential side effects. If were to occur stop both effexor and remeron. If severe signs/symptoms ED evaluation.  Serotonin Syndrome Serotonin is a chemical in your body (neurotransmitter) that helps to control several functions, such as:  Brain and nerve cell function.  Mood and emotions.  Memory.  Eating.  Sleeping.  Sexual activity.  Stress response. Having too much serotonin in your body can cause serotonin syndrome. This condition can be harmful to your brain and nerve cells. This can be a life-threatening condition. What are the causes? This condition may be caused by taking medicines or drugs that increase the level of serotonin in your body, such as:  Antidepressant medicines.  Migraine medicines.  Certain pain medicines.  Certain drugs, including ecstasy, LSD, cocaine, and amphetamines.  Over-the-counter cough or cold medicines that contain dextromethorphan.  Certain herbal supplements, including St. John's wort, ginseng, and nutmeg. This condition usually occurs when you take these medicines or drugs in combination, but it can also happen with a high dose of a single medicine or drug. What increases the risk? You are more likely to develop this condition if:  You just started taking a medicine or drug that increases the level of serotonin in the body.  You recently increased the dose of a medicine or drug that increases the level of serotonin in the body.  You take more than one medicine or drug that increases the level of serotonin in the body. What are the signs or symptoms? Symptoms of this condition usually start within  several hours of taking a medicine or drug. Symptoms may be mild or severe. Mild symptoms include:  Sweating.  Restlessness or agitation.  Muscle twitching or stiffness.  Rapid heart rate.  Nausea and vomiting.  Diarrhea.  Headache.  Shivering or goose bumps.  Confusion. Severe symptoms include:  Irregular heartbeat.  Seizures.  Loss of consciousness.  High fever. How is this diagnosed? This condition may be diagnosed based on:  Your medical history.  A physical exam.  Your prior use of drugs and medicines.  Blood or urine tests. These may be used to rule out other causes of your symptoms. How is this treated? The treatment for this condition depends on the severity of your symptoms.  For mild cases, stopping the medicine or drug that caused your condition is usually all that is needed.  For moderate to severe cases, treatment in a hospital may be needed to prevent or manage life-threatening symptoms. This may include medicines to control your symptoms, IV fluids, interventions to support your breathing, and treatments to control your body temperature. Follow these instructions at home: Medicines   Take over-the-counter and prescription medicines only as told by your health care provider. This is important.  Check with your health care provider before you start taking any new prescriptions, over-the-counter medicines, herbs, or supplements.  Avoid combining any medicines that can cause this condition to occur. Lifestyle   Maintain a healthy lifestyle. ? Eat a healthy diet that includes plenty of vegetables, fruits, whole grains, low-fat dairy products, and lean protein. Do not eat a lot of foods that are high in fat, added sugars, or  salt. ? Get the right amount and quality of sleep. Most adults need 7-9 hours of sleep each night. ? Make time to exercise, even if it is only for short periods of time. Most adults should exercise for at least 150 minutes each  week. ? Do not drink alcohol. ? Do not use illegal drugs, and do not take medicines for reasons other than they are prescribed. General instructions  Do not use any products that contain nicotine or tobacco, such as cigarettes and e-cigarettes. If you need help quitting, ask your health care provider.  Keep all follow-up visits as told by your health care provider. This is important. Contact a health care provider if:  Your symptoms do not improve or they get worse. Get help right away if you:  Have worsening confusion, severe headache, chest pain, high fever, seizures, or loss of consciousness.  Experience serious side effects of medicine, such as swelling of your face, lips, tongue, or throat.  Have serious thoughts about hurting yourself or others. These symptoms may represent a serious problem that is an emergency. Do not wait to see if the symptoms will go away. Get medical help right away. Call your local emergency services (911 in the [place] Do not drive yourself to the hospital. If you ever feel like you may hurt yourself or others, or have thoughts about taking your own life, get help right away. You can go to your nearest emergency department or call:  Your local emergency services (911 in the [place]  A suicide crisis helpline, such as the [place] at (320)166-9524. This is open 24 hours a day. Summary  Serotonin is a brain chemical that helps to regulate the nervous system. High levels of serotonin in the body can cause serotonin syndrome, which is a very dangerous condition.  This condition may be caused by taking medicines or drugs that increase the level of serotonin in your body.  Treatment depends on the severity of your symptoms. For mild cases, stopping the medicine or drug that caused your condition is usually all that is needed.  Check with your health care provider before you start taking any new prescriptions, over-the-counter  medicines, herbs, or supplements. This information is not intended to replace advice given to you by your health care provider. Make sure you discuss any questions you have with your health care provider. Document Revised: 07/14/2017 Document Reviewed: 07/14/2017 Elsevier Patient Education  2020 [employer].

## 2020-01-20 NOTE — Progress Notes (Signed)
Subjective:    Patient ID: James [name], male    DOB: Apr 24, 1941, 79 y.o.   MRN: [id number]  HPI  Pt in for mood issues recent his companion/soul mate passed away.  Now he had poor appetite, insomnia, lonely, sad and at times nausea.  Pt called in and described symptoms.   He was given xanax and state sleeping better and not as anxious.  Pt feels like making progress.    Pt does continue to exercise.  Review of Systems  Constitutional: Negative for chills, fatigue and fever.  Respiratory: Negative for cough, chest tightness, shortness of breath and wheezing.   Cardiovascular: Negative for chest pain and palpitations.  Gastrointestinal: Negative for abdominal pain.  Musculoskeletal: Negative for back pain.  Skin: Negative for rash.  Neurological: Negative for dizziness, speech difficulty, weakness, numbness and headaches.  Hematological: Negative for adenopathy.  Psychiatric/Behavioral: Positive for dysphoric mood and sleep disturbance. Negative for behavioral problems, confusion, decreased concentration, hallucinations and suicidal ideas. The patient is nervous/anxious.        Better with moods but still having some symptoms.    Past Medical History:  Diagnosis Date  . AAA (abdominal aortic aneurysm) without rupture ([place]) 09/08/2013  . Abdominal bloating 05/19/2016  . Anxiety state 05/18/2014  . Atrial fibrillation ([place])   . 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 Benign paroxysmal positional vertigo 04/06/2013  . CAD (coronary artery disease)    nonobstructive by cath 2008; nonobstructive by Cardiac CT 11/2011  . Cervical spine fracture (HCC) 1980   C6-7  . Diverticulosis   . Erectile dysfunction 09/08/2013  . GERD (gastroesophageal reflux disease)   . Hiatal hernia   . History of atrial fibrillation   . History of squamous cell carcinoma 10-05-10   Dr [name], [place]  . HLD (hyperlipidemia)   . Hypothyroidism   . Medicare annual wellness visit,  subsequent 03/16/2014   Sees [facility] Follows with Dr Johnsie Cancel of cardiology Sees [facility] for colonoscopy, last colonoscopy in 2013   . Mitral valve prolapse   . Neck pain 05/03/2015  . Preventative health care 03/16/2014  . 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 Syncope 02/03/2013  . 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     Social History   Socioeconomic History  . Marital status: Single    Spouse name: Not on file  . Number of children: Not on file  . Years of education: Not on file  . Highest education level: Not on file  Occupational History  . Occupation: Chief Financial Officer    Comment: Retired  Tobacco Use  . Smoking status: Never Smoker  . Smokeless tobacco: Never Used  Substance and Sexual Activity  . Alcohol use: Yes    Comment: 2 glasses red wine/day  . Drug use: No  . Sexual activity: Yes  Other Topics Concern  . Not on file  Social History Narrative  . Not on file   Social Determinants of Health   Financial Resource Strain:   . Difficulty of Paying Living Expenses:   Food Insecurity:   . Worried About Charity fundraiser in the Last Year:   . Arboriculturist in the Last Year:   Transportation Needs:   . Film/video editor (Medical):   Marland Kitchen Lack of Transportation (Non-Medical):   Physical Activity:   . Days of Exercise per Week:   . Minutes of Exercise per Session:   Stress:   .  Feeling of Stress :   Social Connections:   . Frequency of Communication with Friends and Family:   . Frequency of Social Gatherings with Friends and Family:   . Attends Religious Services:   . Active Member of Clubs or Organizations:   . Attends Archivist Meetings:   Marland Kitchen Marital Status:   Intimate Partner Violence:   . Fear of Current or Ex-Partner:   . Emotionally Abused:   Marland Kitchen Physically Abused:   . Sexually Abused:     Past Surgical History:  Procedure Laterality Date  . APPENDECTOMY  05/2011  . CARDIAC CATHETERIZATION  01/03/12    30% ostial LAD stenosis, ostial 30-40% D1 stenosis, smooth and normal left main and RCA; LVEF 65%  . CHOLECYSTECTOMY N/A 08/25/2014   Procedure: LAPAROSCOPIC CHOLECYSTECTOMY WITH INTRAOPERATIVE CHOLANGIOGRAM;  Surgeon: [name], MD;  Location: [facility];  Service: General;  Laterality: N/A;  . INGUINAL HERNIA REPAIR  07/2010   right  . KNEE ARTHROPLASTY     bilat  . LEFT HEART CATHETERIZATION WITH CORONARY ANGIOGRAM N/A 01/03/2012   Procedure: LEFT HEART CATHETERIZATION WITH CORONARY ANGIOGRAM;  Surgeon: [name], MD;  Location: [facility] CATH LAB;  Service: Cardiovascular;  Laterality: N/A;  . MRI  10/31/2016  . TONSILLECTOMY  1947  . tumor removal  1958   "fatty tumor"    Family History  Problem Relation Age of Onset  . Heart disease Mother        MI 53s  . Stroke Mother   . Hypertension Mother   . Heart disease Maternal Grandfather        MI 70-80s  . Heart disease Maternal Grandmother        MI 70-80s  . Lung disease Father        penumonitis  . Cancer Sister        colon  . Cancer Paternal Uncle        GI: colon he thinks    Allergies  Allergen Reactions  . Sulfa Antibiotics Other (See Comments)    Childhood allergy    Current Outpatient Medications on File Prior to Visit  Medication Sig Dispense Refill  . ALPRAZolam (XANAX) 0.25 MG tablet Take 1 tablet (0.25 mg total) by mouth 2 (two) times daily as needed for anxiety. 20 tablet 0  . aspirin EC 81 MG tablet Take 81 mg by mouth 2 (two) times a week. About once a twice a week    . atorvastatin (LIPITOR) 20 MG tablet TAKE 1 TABLET BY MOUTH EVERY DAY AT 6PM FOR CHOLESTEROL 90 tablet 0  . Calcium Carb-Cholecalciferol (CALCIUM 1000 + D PO) Take 1 tablet by mouth daily.    . Dutasteride-Tamsulosin HCl 0.5-0.4 MG CAPS TAKE 1 CAPSULE BY MOUTH AT BEDTIME 90 capsule 1  . ELDERBERRY PO Take by mouth.    . levothyroxine (SYNTHROID) 100 MCG tablet TAKE 1 TABLET BY MOUTH EVERY MORNING BEFORE BREAKFAST 90 tablet 1  . LORazepam  (ATIVAN) 0.5 MG tablet TAKE 1 TABLET BY MOUTH 2 TIMES A DAY AS NEEDED FOR ANXIETY 30 tablet 1  . meclizine (ANTIVERT) 12.5 MG tablet 1-2 tab po tid prn dizziness 30 tablet 1  . Milk Thistle 1000 MG CAPS Take 1 capsule by mouth daily.    . mirtazapine (REMERON) 15 MG tablet TAKE 1 TABLET BY MOUTH EVERY DAY 90 tablet 2  . Multiple Vitamins-Minerals (MULTIVITAMINS THER. W/MINERALS) TABS Take 1 tablet by mouth daily.    . pantoprazole (PROTONIX) 40 MG tablet  TAKE 1 TABLET BY MOUTH 2 TIMES A DAY BEFORE MEALS FOR ACID REFLUX 60 tablet 0  . Probiotic Product (PROBIOTIC DAILY PO) Take by mouth.    . sildenafil (REVATIO) 20 MG tablet TAKE 1 TO 5 TABLETS BY MOUTH EVERY DAY AS NEEDED 35 tablet 1  . Testosterone 30 MG/ACT SOLN APPLY 1 PUMP UNDER EACH ARM EVERY MORNING AS DIRECTED 180 mL 0  . zolpidem (AMBIEN) 10 MG tablet TAKE 1 TABLET BY MOUTH EVERY NIGHT AT BEDTIME AS NEEDED FOR SLEEP 30 tablet 2   No current facility-administered medications on file prior to visit.    BP 100/67   Pulse 54   Temp 97.8 F (36.6 C) (Oral)   Resp 18   Ht 5\' 10"  (1.778 m)   Wt 151 lb (68.5 kg)   SpO2 97%   BMI 21.67 kg/m       Objective:   Physical Exam  General Mental Status- Alert. General Appearance- Not in acute distress.   Skin General: Color- Normal Color. Moisture- Normal Moisture.  Neck Carotid Arteries- Normal color. Moisture- Normal Moisture. No carotid bruits. No JVD.  Chest and Lung Exam Auscultation: Breath Sounds:-Normal.  Cardiovascular Auscultation:Rythm- Regular. Murmurs & Other Heart Sounds:Auscultation of the heart reveals- No Murmurs.  Abdomen Inspection:-Inspeection Normal. Palpation/Percussion:Note:No mass. Palpation and Percussion of the abdomen reveal- Non Tender, Non Distended + BS, no rebound or guarding.   Neurologic Cranial Nerve exam:- CN III-XII intact(No nystagmus), symmetric smile. Strength:- 5/5 equal and symmetric strength both upper and lower  extremities.      Assessment & Plan:  For anxiety and depression, I want you to continue both xanax and remeron.  Will add low dose effexor to current regimen.  For nausea and fatigue will get listed labs today.  Consider counseling if not improved with this then we can give counselor/therapist information  Follow up in one month or as needed.

## 2020-01-23 LAB — VITAMIN D 1,25 DIHYDROXY
Vitamin D 1, 25 (OH)2 Total: 29 pg/mL (ref 18–72)
Vitamin D2 1, 25 (OH)2: <8 pg/mL
Vitamin D3 1, 25 (OH)2: 29 pg/mL

## 2020-01-27 ENCOUNTER — Other Ambulatory Visit: Payer: Self-pay | Admitting: Family Medicine

## 2020-01-27 NOTE — Telephone Encounter (Signed)
Requesting: xanax Contract:n/a UDS:06/11/2018 Last Visit: 01/20/20 Next Visit:n/a Last Refill:01/15/20  Please Advise

## 2020-02-06 ENCOUNTER — Other Ambulatory Visit: Payer: Self-pay | Admitting: Medical

## 2020-02-10 ENCOUNTER — Other Ambulatory Visit: Payer: Self-pay | Admitting: Family Medicine

## 2020-02-12 ENCOUNTER — Other Ambulatory Visit: Payer: Self-pay | Admitting: Medical

## 2020-02-25 ENCOUNTER — Ambulatory Visit (INDEPENDENT_AMBULATORY_CARE_PROVIDER_SITE_OTHER): Payer: Medicare Other | Admitting: Medical

## 2020-02-25 ENCOUNTER — Other Ambulatory Visit: Payer: Self-pay

## 2020-02-25 VITALS — BP 139/71 | HR 53 | Resp 18 | Ht 70.0 in | Wt 146.6 lb

## 2020-02-25 DIAGNOSIS — R197 Diarrhea, unspecified: Secondary | ICD-10-CM | POA: Diagnosis not present

## 2020-02-25 DIAGNOSIS — F32A Depression, unspecified: Secondary | ICD-10-CM

## 2020-02-25 DIAGNOSIS — F411 Generalized anxiety disorder: Secondary | ICD-10-CM

## 2020-02-25 DIAGNOSIS — F329 Major depressive disorder, single episode, unspecified: Secondary | ICD-10-CM | POA: Diagnosis not present

## 2020-02-25 DIAGNOSIS — I2510 Atherosclerotic heart disease of native coronary artery without angina pectoris: Secondary | ICD-10-CM | POA: Diagnosis not present

## 2020-02-25 DIAGNOSIS — I2583 Coronary atherosclerosis due to lipid rich plaque: Secondary | ICD-10-CM

## 2020-02-25 MED ORDER — VENLAFAXINE HCL ER 37.5 MG PO CP24: 37.5000 mg | ORAL_CAPSULE | Freq: Every day | ORAL | 1 refills | Status: DC

## 2020-02-25 NOTE — Progress Notes (Signed)
Subjective:    Patient ID: James [name], male    DOB: 23-Mar-1941, 79 y.o.   MRN: [id number]  HPI  Pt in for follow up.  Pt states he is doing well with effexor.  Sleeping well, good energy, less anxious and mood is  mood. See last visit.  Pt states over past 5-7 days approximate he states progressively got loose stools and now diarrhea like. Watery side. He went about 6-7 times today. No recent antibiotics.   Pt states no vomiting, slight decreased appetite(but that was after loss of soul mate).  No recent antibiotics. No abdomen pain. No fever, no chills or sweats.    Review of Systems  Constitutional: Negative for chills, fatigue and fever.  Respiratory: Negative for cough, chest tightness, shortness of breath and wheezing.   Cardiovascular: Negative for chest pain and palpitations.  Gastrointestinal: Positive for diarrhea. Negative for abdominal pain, nausea and vomiting.  Genitourinary: Negative for enuresis.  Neurological: Negative for dizziness, syncope, weakness, light-headedness and headaches.  Hematological: Negative for adenopathy. Does not bruise/bleed easily.  Psychiatric/Behavioral: Positive for decreased concentration. Negative for behavioral problems, hallucinations, sleep disturbance and suicidal ideas. The patient is nervous/anxious.        Mood recently better with effexor.    Past Medical History:  Diagnosis Date  . AAA (abdominal aortic aneurysm) without rupture ([place]) 09/08/2013  . Abdominal bloating 05/19/2016  . Anxiety state 05/18/2014  . Atrial fibrillation ([place])   . 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 Benign paroxysmal positional vertigo 04/06/2013  . CAD (coronary artery disease)    nonobstructive by cath 2008; nonobstructive by Cardiac CT 11/2011  . Cervical spine fracture (HCC) 1980   C6-7  . Diverticulosis   . Erectile dysfunction 09/08/2013  . GERD (gastroesophageal reflux disease)   . Hiatal hernia   . History of atrial  fibrillation   . History of squamous cell carcinoma 10-05-10   Dr [name], [place]  . HLD (hyperlipidemia)   . Hypothyroidism   . Medicare annual wellness visit, subsequent 03/16/2014   Sees [facility] Follows with Dr Johnsie Cancel of cardiology Sees [facility] for colonoscopy, last colonoscopy in 2013   . Mitral valve prolapse   . Neck pain 05/03/2015  . Preventative health care 03/16/2014  . 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 Syncope 02/03/2013  . 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     Social History   Socioeconomic History  . Marital status: Single    Spouse name: Not on file  . Number of children: Not on file  . Years of education: Not on file  . Highest education level: Not on file  Occupational History  . Occupation: Chief Financial Officer    Comment: Retired  Tobacco Use  . Smoking status: Never Smoker  . Smokeless tobacco: Never Used  Substance and Sexual Activity  . Alcohol use: Yes    Comment: 2 glasses red wine/day  . Drug use: No  . Sexual activity: Yes  Other Topics Concern  . Not on file  Social History Narrative  . Not on file   Social Determinants of Health   Financial Resource Strain:   . Difficulty of Paying Living Expenses: Not on file  Food Insecurity:   . Worried About Charity fundraiser in the Last Year: Not on file  . Ran Out of Food in the Last Year: Not on file  Transportation Needs:   . Lack of Transportation (Medical): Not  on file  . Lack of Transportation (Non-Medical): Not on file  Physical Activity:   . Days of Exercise per Week: Not on file  . Minutes of Exercise per Session: Not on file  Stress:   . Feeling of Stress : Not on file  Social Connections:   . Frequency of Communication with Friends and Family: Not on file  . Frequency of Social Gatherings with Friends and Family: Not on file  . Attends Religious Services: Not on file  . Active Member of Clubs or Organizations: Not on file  . Attends Theatre manager Meetings: Not on file  . Marital Status: Not on file  Intimate Partner Violence:   . Fear of Current or Ex-Partner: Not on file  . Emotionally Abused: Not on file  . Physically Abused: Not on file  . Sexually Abused: Not on file    Past Surgical History:  Procedure Laterality Date  . APPENDECTOMY  05/2011  . CARDIAC CATHETERIZATION  01/03/12   30% ostial LAD stenosis, ostial 30-40% D1 stenosis, smooth and normal left main and RCA; LVEF 65%  . CHOLECYSTECTOMY N/A 08/25/2014   Procedure: LAPAROSCOPIC CHOLECYSTECTOMY WITH INTRAOPERATIVE CHOLANGIOGRAM;  Surgeon: [name], MD;  Location: [facility];  Service: General;  Laterality: N/A;  . INGUINAL HERNIA REPAIR  07/2010   right  . KNEE ARTHROPLASTY     bilat  . LEFT HEART CATHETERIZATION WITH CORONARY ANGIOGRAM N/A 01/03/2012   Procedure: LEFT HEART CATHETERIZATION WITH CORONARY ANGIOGRAM;  Surgeon: [name], MD;  Location: [facility] CATH LAB;  Service: Cardiovascular;  Laterality: N/A;  . MRI  10/31/2016  . TONSILLECTOMY  1947  . tumor removal  1958   "fatty tumor"    Family History  Problem Relation Age of Onset  . Heart disease Mother        MI 7s  . Stroke Mother   . Hypertension Mother   . Heart disease Maternal Grandfather        MI 70-80s  . Heart disease Maternal Grandmother        MI 70-80s  . Lung disease Father        penumonitis  . Cancer Sister        colon  . Cancer Paternal Uncle        GI: colon he thinks    Allergies  Allergen Reactions  . Sulfa Antibiotics Other (See Comments)    Childhood allergy    Current Outpatient Medications on File Prior to Visit  Medication Sig Dispense Refill  . ALPRAZolam (XANAX) 0.25 MG tablet TAKE 1 TABLET BY MOUTH TWICE DAILY AS NEEDED FOR ANXIETY 20 tablet 1  . aspirin EC 81 MG tablet Take 81 mg by mouth 2 (two) times a week. About once a twice a week    . atorvastatin (LIPITOR) 20 MG tablet TAKE 1 TABLET BY MOUTH EVERY DAY AT 6PM FOR CHOLESTEROL 90 tablet  0  . Calcium Carb-Cholecalciferol (CALCIUM 1000 + D PO) Take 1 tablet by mouth daily.    . Dutasteride-Tamsulosin HCl 0.5-0.4 MG CAPS Take 1 capsule by mouth at bedtime. 90 capsule 0  . ELDERBERRY PO Take by mouth.    . levothyroxine (SYNTHROID) 100 MCG tablet Take 1 tablet (100 mcg total) by mouth daily before breakfast. 90 tablet 0  . LORazepam (ATIVAN) 0.5 MG tablet TAKE 1 TABLET BY MOUTH 2 TIMES A DAY AS NEEDED FOR ANXIETY 30 tablet 1  . meclizine (ANTIVERT) 12.5 MG tablet 1-2 tab po tid prn  dizziness 30 tablet 1  . Milk Thistle 1000 MG CAPS Take 1 capsule by mouth daily.    . mirtazapine (REMERON) 15 MG tablet TAKE 1 TABLET BY MOUTH EVERY DAY 90 tablet 2  . Multiple Vitamins-Minerals (MULTIVITAMINS THER. W/MINERALS) TABS Take 1 tablet by mouth daily.    . pantoprazole (PROTONIX) 40 MG tablet TAKE 1 TABLET BY MOUTH 2 TIMES A DAY BEFORE MEALS FOR ACID REFLUX 60 tablet 0  . Probiotic Product (PROBIOTIC DAILY PO) Take by mouth.    . sildenafil (REVATIO) 20 MG tablet TAKE 1 TO 5 TABLETS BY MOUTH EVERY DAY AS NEEDED 35 tablet 1  . Testosterone 30 MG/ACT SOLN APPLY 1 PUMP UNDER EACH ARM EVERY MORNING AS DIRECTED 180 mL 0  . venlafaxine XR (EFFEXOR-XR) 37.5 MG 24 hr capsule Take 1 capsule (37.5 mg total) by mouth daily with breakfast. 30 capsule 0  . zolpidem (AMBIEN) 10 MG tablet TAKE 1 TABLET BY MOUTH EVERY NIGHT AT BEDTIME AS NEEDED FOR SLEEP 30 tablet 2   No current facility-administered medications on file prior to visit.    BP 139/71   Pulse (!) 53   Resp 18   Ht _0  (1.778 m)   Wt 146 lb 9.6 oz (66.5 kg)   SpO2 100%   BMI 21.03 kg/m       Objective:   Physical Exam   General- No acute distress. Pleasant patient. Neck- Full range of motion, no jvd Lungs- Clear, even and unlabored. Heart- regular rate and rhythm. Neurologic- CNII- XII grossly intact. Abdomen- soft, nt, nd, non-distended, +bs, no rebound or guarding. Skin- no tenting of skin.      Assessment & Plan:    Your anxiety and mood has improved with adding Effexor.  I did go ahead and send in refills to your pharmacy.  I think you could use the Effexor long-term going forward or if you want to try to taper off after 88-monthuse we can do that as well.  Rx advisement given.  Your recent loose stools progressively getting worse.  Recommend that you eat bland foods.  Avoid fried foods, greasy foods, meats and fruit juice.  Breads, crackers and soups should be okay.  Hydrate well with propel fitness water or sugar-free Gatorade.  Turn in stool panel kits.  As you turn in stool kit you could use Imodium to help control loose stools.  If your loose stools are excessive and not improving while he wait for stool studies then might prescribe antibiotic.  Presently antibiotics not indicated.  Follow-up in 2 weeks or as needed.

## 2020-02-25 NOTE — Patient Instructions (Addendum)
Your anxiety and mood has improved with adding Effexor.  I did go ahead and send in refills to your pharmacy.  I think you could use the Effexor long-term going forward or if you want to try to taper off after 23-monthuse we can do that as well.  Rx advisement given.  Your recent loose stools progressively getting worse.  Recommend that you eat bland foods.  Avoid fried foods, greasy foods, meats and fruit juice.  Breads, crackers and soups should be okay.  Hydrate well with propel fitness water or sugar-free Gatorade.  Turn in stool panel kits.  As you turn in stool kit you could use Imodium to help control loose stools.  If your loose stools are excessive and not improving while he wait for stool studies then might prescribe antibiotic.  Presently antibiotics not indicated.  Follow-up in 2 weeks or as needed.   Serotonin Syndrome Serotonin is a chemical in your body (neurotransmitter) that helps to control several functions, such as:  Brain and nerve cell function.  Mood and emotions.  Memory.  Eating.  Sleeping.  Sexual activity.  Stress response. Having too much serotonin in your body can cause serotonin syndrome. This condition can be harmful to your brain and nerve cells. This can be a life-threatening condition. What are the causes? This condition may be caused by taking medicines or drugs that increase the level of serotonin in your body, such as:  Antidepressant medicines.  Migraine medicines.  Certain pain medicines.  Certain drugs, including ecstasy, LSD, cocaine, and amphetamines.  Over-the-counter cough or cold medicines that contain dextromethorphan.  Certain herbal supplements, including St. John's wort, ginseng, and nutmeg. This condition usually occurs when you take these medicines or drugs in combination, but it can also happen with a high dose of a single medicine or drug. What increases the risk? You are more likely to develop this condition if:  You just  started taking a medicine or drug that increases the level of serotonin in the body.  You recently increased the dose of a medicine or drug that increases the level of serotonin in the body.  You take more than one medicine or drug that increases the level of serotonin in the body. What are the signs or symptoms? Symptoms of this condition usually start within several hours of taking a medicine or drug. Symptoms may be mild or severe. Mild symptoms include:  Sweating.  Restlessness or agitation.  Muscle twitching or stiffness.  Rapid heart rate.  Nausea and vomiting.  Diarrhea.  Headache.  Shivering or goose bumps.  Confusion. Severe symptoms include:  Irregular heartbeat.  Seizures.  Loss of consciousness.  High fever. How is this diagnosed? This condition may be diagnosed based on:  Your medical history.  A physical exam.  Your prior use of drugs and medicines.  Blood or urine tests. These may be used to rule out other causes of your symptoms. How is this treated? The treatment for this condition depends on the severity of your symptoms.  For mild cases, stopping the medicine or drug that caused your condition is usually all that is needed.  For moderate to severe cases, treatment in a hospital may be needed to prevent or manage life-threatening symptoms. This may include medicines to control your symptoms, IV fluids, interventions to support your breathing, and treatments to control your body temperature. Follow these instructions at home: Medicines   Take over-the-counter and prescription medicines only as told by your health care provider.  This is important.  Check with your health care provider before you start taking any new prescriptions, over-the-counter medicines, herbs, or supplements.  Avoid combining any medicines that can cause this condition to occur. Lifestyle   Maintain a healthy lifestyle. ? Eat a healthy diet that includes plenty of  vegetables, fruits, whole grains, low-fat dairy products, and lean protein. Do not eat a lot of foods that are high in fat, added sugars, or salt. ? Get the right amount and quality of sleep. Most adults need 7-9 hours of sleep each night. ? Make time to exercise, even if it is only for short periods of time. Most adults should exercise for at least 150 minutes each week. ? Do not drink alcohol. ? Do not use illegal drugs, and do not take medicines for reasons other than they are prescribed. General instructions  Do not use any products that contain nicotine or tobacco, such as cigarettes and e-cigarettes. If you need help quitting, ask your health care provider.  Keep all follow-up visits as told by your health care provider. This is important. Contact a health care provider if:  Your symptoms do not improve or they get worse. Get help right away if you:  Have worsening confusion, severe headache, chest pain, high fever, seizures, or loss of consciousness.  Experience serious side effects of medicine, such as swelling of your face, lips, tongue, or throat.  Have serious thoughts about hurting yourself or others. These symptoms may represent a serious problem that is an emergency. Do not wait to see if the symptoms will go away. Get medical help right away. Call your local emergency services (911 in the [place] Do not drive yourself to the hospital. If you ever feel like you may hurt yourself or others, or have thoughts about taking your own life, get help right away. You can go to your nearest emergency department or call:  Your local emergency services (911 in the [place]  A suicide crisis helpline, such as the [facility] at 406 333 4925. This is open 24 hours a day. Summary  Serotonin is a brain chemical that helps to regulate the nervous system. High levels of serotonin in the body can cause serotonin syndrome, which is a very dangerous condition.  This  condition may be caused by taking medicines or drugs that increase the level of serotonin in your body.  Treatment depends on the severity of your symptoms. For mild cases, stopping the medicine or drug that caused your condition is usually all that is needed.  Check with your health care provider before you start taking any new prescriptions, over-the-counter medicines, herbs, or supplements. This information is not intended to replace advice given to you by your health care provider. Make sure you discuss any questions you have with your health care provider. Document Revised: 07/14/2017 Document Reviewed: 07/14/2017 Elsevier Patient Education  2020 [employer].

## 2020-02-25 NOTE — Addendum Note (Signed)
Addended by: Kelle Darting A on: 02/25/2020 02:02 PM   Modules accepted: Orders

## 2020-02-26 ENCOUNTER — Other Ambulatory Visit: Payer: Medicare Other

## 2020-02-26 DIAGNOSIS — R197 Diarrhea, unspecified: Secondary | ICD-10-CM

## 2020-02-27 LAB — CLOSTRIDIUM DIFFICILE TOXIN B, QUALITATIVE, REAL-TIME PCR: Toxigenic C. Difficile by PCR: NOT DETECTED

## 2020-02-28 ENCOUNTER — Other Ambulatory Visit: Payer: Self-pay | Admitting: Family Medicine

## 2020-03-02 ENCOUNTER — Telehealth: Payer: Self-pay | Admitting: Medical

## 2020-03-02 NOTE — Telephone Encounter (Signed)
Ova and parasite labs not resulted yet .

## 2020-03-02 NOTE — Telephone Encounter (Signed)
Notify pt ova and parasite not back yet. The stool culture test ordered Will you check and see if stool culture also pending/in process. Coordinate with Gilmore Laroche.

## 2020-03-02 NOTE — Telephone Encounter (Signed)
Patient had fecal test done a week ago and was already given negative C. Diff results. He is calling to see if there are other results from that test. He was under the impression they were testing for other things also.

## 2020-03-03 NOTE — Telephone Encounter (Signed)
[  3:51 PM] Greg Cutter says turnaround time on O & P is 7 days.  she said it is still showing pending status but thinks we should be able to have the result in 24 to 48 hours  3:52 PM] Tyrone Nine     ok thank you   Will call patient tomorrow if results are back.

## 2020-03-04 DIAGNOSIS — F329 Major depressive disorder, single episode, unspecified: Secondary | ICD-10-CM | POA: Diagnosis not present

## 2020-03-04 DIAGNOSIS — R197 Diarrhea, unspecified: Secondary | ICD-10-CM | POA: Diagnosis not present

## 2020-03-04 DIAGNOSIS — R194 Change in bowel habit: Secondary | ICD-10-CM | POA: Diagnosis not present

## 2020-03-04 DIAGNOSIS — Z86010 Personal history of colonic polyps: Secondary | ICD-10-CM | POA: Diagnosis not present

## 2020-03-04 LAB — OVA AND PARASITE EXAMINATION
CONCENTRATE RESULT:: NONE SEEN
MICRO NUMBER:: 10923795
SPECIMEN QUALITY:: ADEQUATE
TRICHROME RESULT:: NONE SEEN

## 2020-03-05 ENCOUNTER — Telehealth: Payer: Self-pay | Admitting: Family Medicine

## 2020-03-05 NOTE — Progress Notes (Signed)
  Chronic Care Management   Note  03/05/2020 Name: MORIS RATCHFORD MRN: [id number] DOB: January 05, 1941  EBIN PALAZZI is a 79 y.o. year old male who is a primary care patient of [name], MD. I reached out to [name] by phone today in response to a referral sent by Mr. Charles Andringa Hizer's PCP, [name], MD.   Mr. Crescenzo was given information about Chronic Care Management services today including:  1. CCM service includes personalized support from designated clinical staff supervised by his physician, including individualized plan of care and coordination with other care providers 2. 24/7 contact phone numbers for assistance for urgent and routine care needs. 3. Service will only be billed when office clinical staff spend 20 minutes or more in a month to coordinate care. 4. Only one practitioner may furnish and bill the service in a calendar month. 5. The patient may stop CCM services at any time (effective at the end of the month) by phone call to the office staff.   Patient agreed to services and verbal consent obtained.   Follow up plan:   Carley Perdue [employer] Scheduler

## 2020-03-06 NOTE — Telephone Encounter (Signed)
Patient notified of labs .. states he is still having diarrhea but is seeing GI and has a colonoscopy and endoscopy on Tuesday.

## 2020-03-07 ENCOUNTER — Emergency Department (HOSPITAL_BASED_OUTPATIENT_CLINIC_OR_DEPARTMENT_OTHER)
Admission: EM | Admit: 2020-03-07 | Discharge: 2020-03-07 | Disposition: A | Discharge status: Home / Self Care | Payer: Medicare Other | Attending: Emergency Medicine | Admitting: Emergency Medicine

## 2020-03-07 ENCOUNTER — Other Ambulatory Visit: Payer: Self-pay

## 2020-03-07 ENCOUNTER — Encounter (HOSPITAL_BASED_OUTPATIENT_CLINIC_OR_DEPARTMENT_OTHER): Payer: Self-pay | Admitting: *Deleted

## 2020-03-07 DIAGNOSIS — Z79890 Hormone replacement therapy (postmenopausal): Secondary | ICD-10-CM | POA: Insufficient documentation

## 2020-03-07 DIAGNOSIS — I10 Essential (primary) hypertension: Secondary | ICD-10-CM | POA: Insufficient documentation

## 2020-03-07 DIAGNOSIS — Z96651 Presence of right artificial knee joint: Secondary | ICD-10-CM | POA: Insufficient documentation

## 2020-03-07 DIAGNOSIS — Z96652 Presence of left artificial knee joint: Secondary | ICD-10-CM | POA: Insufficient documentation

## 2020-03-07 DIAGNOSIS — E039 Hypothyroidism, unspecified: Secondary | ICD-10-CM | POA: Diagnosis not present

## 2020-03-07 DIAGNOSIS — Z7982 Long term (current) use of aspirin: Secondary | ICD-10-CM | POA: Insufficient documentation

## 2020-03-07 DIAGNOSIS — E860 Dehydration: Secondary | ICD-10-CM | POA: Diagnosis not present

## 2020-03-07 DIAGNOSIS — I2510 Atherosclerotic heart disease of native coronary artery without angina pectoris: Secondary | ICD-10-CM | POA: Diagnosis not present

## 2020-03-07 LAB — BASIC METABOLIC PANEL
Anion gap: 11 (ref 5–15)
BUN: 15 mg/dL (ref 8–23)
CO2: 24 mmol/L (ref 22–32)
Calcium: 9.1 mg/dL (ref 8.9–10.3)
Chloride: 95 mmol/L — ABNORMAL LOW (ref 98–111)
Creatinine, Ser: 0.87 mg/dL (ref 0.61–1.24)
GFR calc Af Amer: >60 mL/min (ref >60)
GFR calc non Af Amer: >60 mL/min (ref >60)
Glucose, Bld: 102 mg/dL — ABNORMAL HIGH (ref 70–99)
Potassium: 4.2 mmol/L (ref 3.5–5.1)
Sodium: 130 mmol/L — ABNORMAL LOW (ref 135–145)

## 2020-03-07 LAB — URINALYSIS, ROUTINE W REFLEX MICROSCOPIC
Bilirubin Urine: NEGATIVE
Glucose, UA: NEGATIVE mg/dL
Hgb urine dipstick: NEGATIVE
Ketones, ur: NEGATIVE mg/dL
Leukocytes,Ua: NEGATIVE
Nitrite: NEGATIVE
Protein, ur: NEGATIVE mg/dL
Specific Gravity, Urine: 1.01 (ref 1.005–1.030)
pH: 6.5 (ref 5.0–8.0)

## 2020-03-07 LAB — TROPONIN I (HIGH SENSITIVITY)
Troponin I (High Sensitivity): 29 ng/L — ABNORMAL HIGH (ref <18)
Troponin I (High Sensitivity): 32 ng/L — ABNORMAL HIGH (ref <18)

## 2020-03-07 LAB — CBC
HCT: 42.7 % (ref 39.0–52.0)
Hemoglobin: 14.7 g/dL (ref 13.0–17.0)
MCH: 32.1 pg (ref 26.0–34.0)
MCHC: 34.4 g/dL (ref 30.0–36.0)
MCV: 93.2 fL (ref 80.0–100.0)
Platelets: 199 10*3/uL (ref 150–400)
RBC: 4.58 MIL/uL (ref 4.22–5.81)
RDW: 12.2 % (ref 11.5–15.5)
WBC: 10.6 10*3/uL — ABNORMAL HIGH (ref 4.0–10.5)
nRBC: 0 % (ref 0.0–0.2)

## 2020-03-07 LAB — HEPATIC FUNCTION PANEL
ALT: 23 U/L (ref 0–44)
AST: 31 U/L (ref 15–41)
Albumin: 4.3 g/dL (ref 3.5–5.0)
Alkaline Phosphatase: 51 U/L (ref 38–126)
Bilirubin, Direct: 0.2 mg/dL (ref 0.0–0.2)
Indirect Bilirubin: 1 mg/dL — ABNORMAL HIGH (ref 0.3–0.9)
Total Bilirubin: 1.2 mg/dL (ref 0.3–1.2)
Total Protein: 7.5 g/dL (ref 6.5–8.1)

## 2020-03-07 LAB — CK: Total CK: 177 U/L (ref 49–397)

## 2020-03-07 MED ORDER — ACETAMINOPHEN 500 MG PO TABS
1000.0000 mg | ORAL_TABLET | Freq: Once | ORAL | Status: AC
  Administered 2020-03-07: 1000 mg via ORAL
  Filled 2020-03-07: qty 2

## 2020-03-07 MED ORDER — SODIUM CHLORIDE 0.9 % IV BOLUS
500.0000 mL | Freq: Once | INTRAVENOUS | Status: AC
  Administered 2020-03-07: 500 mL via INTRAVENOUS

## 2020-03-07 NOTE — Discharge Instructions (Addendum)
1.  Your sodium is mildly low.  Increase your sodium intake for the next 48 hours.  Drink fluids that have electrolytes in them such as Pedialyte and Gatorade. 2.  Return to the emergency department if you develop any chest pain, shortness of breath, unusual headache, blurred vision weakness numbness tingling or other concerning symptoms.

## 2020-03-07 NOTE — ED Provider Notes (Signed)
Blood pressure 124/84, pulse (!) 54, temperature 98.2 F (36.8 C), temperature source Oral, resp. rate 16, height 5\' 10"  (1.778 m), weight 65.8 kg, SpO2 98 %.  Assuming care from Dr. Johnney Killian.  In short, James [name] is a 79 y.o. male with a chief complaint of dehydration .  Refer to the original H&P for additional details.  The current plan of care is to f/u on delta troponin.  06:43 PM  Patient's second troponin is not significantly changed from his initial.  He is having no chest pain or EKG changes.  Discussed his lab work with him along with follow-up plan with his primary care doctor in the coming week for repeat blood work.  Discussed ED return precautions.  Patient has a colonoscopy scheduled for Tuesday.  He will call his GI doctor and PCP to discuss whether he would need further clearance prior to undergoing this procedure.     Margette Fast, MD 03/07/20 [phone]

## 2020-03-07 NOTE — Telephone Encounter (Signed)
Over the weekend saw pt went to ED.

## 2020-03-07 NOTE — ED Provider Notes (Addendum)
[facility] EMERGENCY DEPARTMENT Provider Note   CSN: [id number] Arrival date & time: 03/07/20  1103     History Chief Complaint  Patient presents with  . dehydration    James [name] is a 79 y.o. male.  HPI Patient ran a 5K this morning.  He reports after he got done running he got very fatigued and lightheaded.  He had to lie down on the pavement for about 30 minutes before he was able to get up again.  He reports he never lost consciousness.  He just felt really weak and exhausted.  He reports it was a little foggy to me who always around him but does not think he was actually significantly confused.  No focal weakness numbness or tingling.  No headache.  No chest pain or shortness of breath.  Patient reports he is run multiple 5K's and not had this problem.  He however has had diarrhea for the past several weeks.  To 6 episodes per day.  He has seen his doctor and had stool studies done that were negative.  Scheduled to get a colonoscopy this week.  He did eat slightly less this morning and took an Imodium due to concerns of having a diarrheal bowel movement while running.  He feels completely recovered at this time.  No pain, no shortness of breath, no weakness.    Past Medical History:  Diagnosis Date  . AAA (abdominal aortic aneurysm) without rupture ([facility]) 09/08/2013  . Abdominal bloating 05/19/2016  . Anxiety state 05/18/2014  . Atrial fibrillation ([place])   . 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 Benign paroxysmal positional vertigo 04/06/2013  . CAD (coronary artery disease)    nonobstructive by cath 2008; nonobstructive by Cardiac CT 11/2011  . Cervical spine fracture ([facility]) 1980   C6-7  . Diverticulosis   . Erectile dysfunction 09/08/2013  . GERD (gastroesophageal reflux disease)   . Hiatal hernia   . History of atrial fibrillation   . History of squamous cell carcinoma 10-05-10   Dr [name], [place]  . HLD (hyperlipidemia)   . Hypothyroidism   .  Medicare annual wellness visit, subsequent 03/16/2014   Sees [facility] Follows with Dr Johnsie Cancel of cardiology Sees [facility] for colonoscopy, last colonoscopy in 2013   . Mitral valve prolapse   . Neck pain 05/03/2015  . Preventative health care 03/16/2014  . 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 Syncope 02/03/2013  . 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    Patient Active Problem List   Diagnosis Date Noted  . Skin lesion of left leg 09/17/2019  . Bilateral thoracic back pain 11/27/2016  . Essential hypertension 11/27/2016  . Abdominal bloating 05/19/2016  . BPH (benign prostatic hyperplasia) 08/14/2015  . Neck pain 05/03/2015  . 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 Anxiety state 05/18/2014  . Low testosterone 03/16/2014  . Insomnia 03/16/2014  . Preventative health care 03/16/2014  . Diverticulosis of colon without hemorrhage 03/16/2014  . Knee pain 10/19/2013  . AAA (abdominal aortic aneurysm) without rupture ([place]) 09/08/2013  . Erectile dysfunction 09/08/2013  . Benign paroxysmal positional vertigo 04/06/2013  . Chest pain 01/03/2012  . Hyperlipidemia 01/03/2012  . CAD (coronary artery disease) 01/03/2012  . Hypothyroidism 01/03/2012  . GERD (gastroesophageal reflux disease) 01/03/2012  . History of atrial fibrillation   . Diverticulitis   . Mitral valve prolapse     Past Surgical History:  Procedure Laterality  Date  . APPENDECTOMY  05/2011  . CARDIAC CATHETERIZATION  01/03/12   30% ostial LAD stenosis, ostial 30-40% D1 stenosis, smooth and normal left main and RCA; LVEF 65%  . CHOLECYSTECTOMY N/A 08/25/2014   Procedure: LAPAROSCOPIC CHOLECYSTECTOMY WITH INTRAOPERATIVE CHOLANGIOGRAM;  Surgeon: [name], MD;  Location: [facility];  Service: General;  Laterality: N/A;  . INGUINAL HERNIA REPAIR  07/2010   right  . KNEE ARTHROPLASTY     bilat  . LEFT HEART CATHETERIZATION WITH CORONARY ANGIOGRAM N/A 01/03/2012   Procedure: LEFT  HEART CATHETERIZATION WITH CORONARY ANGIOGRAM;  Surgeon: [name], MD;  Location: [facility] CATH LAB;  Service: Cardiovascular;  Laterality: N/A;  . MRI  10/31/2016  . TONSILLECTOMY  1947  . tumor removal  1958   "fatty tumor"       Family History  Problem Relation Age of Onset  . Heart disease Mother        MI 42s  . Stroke Mother   . Hypertension Mother   . Heart disease Maternal Grandfather        MI 70-80s  . Heart disease Maternal Grandmother        MI 70-80s  . Lung disease Father        penumonitis  . Cancer Sister        colon  . Cancer Paternal Uncle        GI: colon he thinks    Social History   Tobacco Use  . Smoking status: Never Smoker  . Smokeless tobacco: Never Used  Substance Use Topics  . Alcohol use: Yes    Comment: 2 glasses red wine/day  . Drug use: No    Home Medications Prior to Admission medications   Medication Sig Start Date End Date Taking? Authorizing Provider  ALPRAZolam Duanne Moron) 0.25 MG tablet TAKE 1 TABLET BY MOUTH TWICE DAILY AS NEEDED FOR ANXIETY 01/27/20   [name], MD  aspirin EC 81 MG tablet Take 81 mg by mouth 2 (two) times a week. About once a twice a week    [provider]  atorvastatin (LIPITOR) 20 MG tablet TAKE 1 TABLET BY MOUTH EVERY DAY AT 6PM FOR CHOLESTEROL 02/28/20   [name], MD  Calcium Carb-Cholecalciferol (CALCIUM 1000 + D PO) Take 1 tablet by mouth daily.    [provider]  Dutasteride-Tamsulosin HCl 0.5-0.4 MG CAPS Take 1 capsule by mouth at bedtime. 02/10/20   [name], MD  ELDERBERRY PO Take by mouth.    [provider]  levothyroxine (SYNTHROID) 100 MCG tablet Take 1 tablet (100 mcg total) by mouth daily before breakfast. 02/10/20   [name], MD  LORazepam (ATIVAN) 0.5 MG tablet TAKE 1 TABLET BY MOUTH 2 TIMES A DAY AS NEEDED FOR ANXIETY 02/10/20   [name], MD  meclizine (ANTIVERT) 12.5 MG tablet 1-2 tab po tid prn dizziness 04/19/17   Saguier, Percell Miller, PA-C    Milk Thistle 1000 MG CAPS Take 1 capsule by mouth daily.    [provider]  Multiple Vitamins-Minerals (MULTIVITAMINS THER. W/MINERALS) TABS Take 1 tablet by mouth daily.    [provider]  pantoprazole (PROTONIX) 40 MG tablet TAKE 1 TABLET BY MOUTH 2 TIMES A DAY BEFORE MEALS FOR ACID REFLUX 02/28/20   [name], MD  Probiotic Product (PROBIOTIC DAILY PO) Take by mouth.    [provider]  sildenafil (REVATIO) 20 MG tablet TAKE 1 TO 5 TABLETS BY MOUTH EVERY DAY AS  NEEDED 01/14/20   [name], MD  Testosterone 30 MG/ACT SOLN APPLY 1 PUMP UNDER EACH ARM EVERY MORNING AS DIRECTED 01/14/20   [name], MD  venlafaxine XR (EFFEXOR XR) 37.5 MG 24 hr capsule Take 1 capsule (37.5 mg total) by mouth daily with breakfast. 02/25/20   Saguier, Percell Miller, PA-C  zolpidem (AMBIEN) 10 MG tablet TAKE 1 TABLET BY MOUTH EVERY NIGHT AT BEDTIME AS NEEDED FOR SLEEP 01/14/20   [name], MD    Allergies    Sulfa antibiotics  Review of Systems   Review of Systems 10 systems reviewed and negative except as per HPI Physical Exam Updated Vital Signs BP 135/78 (BP Location: Right Arm)   Pulse (!) 58   Temp 98 F (36.7 C) (Oral)   Resp 18   Ht 5\' 10"  (1.778 m)   Wt 65.8 kg   SpO2 100%   BMI 20.81 kg/m   Physical Exam Constitutional:      Appearance: He is well-developed.  HENT:     Head: Normocephalic and atraumatic.     Mouth/Throat:     Mouth: Mucous membranes are moist.     Pharynx: Oropharynx is clear.  Eyes:     Extraocular Movements: Extraocular movements intact.     Conjunctiva/sclera: Conjunctivae normal.  Cardiovascular:     Rate and Rhythm: Normal rate and regular rhythm.     Heart sounds: Normal heart sounds.  Pulmonary:     Effort: Pulmonary effort is normal.     Breath sounds: Normal breath sounds.  Abdominal:     General: Bowel sounds are normal. There is no distension.     Palpations: Abdomen is soft.     Tenderness: There is no  abdominal tenderness.  Musculoskeletal:        General: No swelling or tenderness. Normal range of motion.     Cervical back: Neck supple.     Right lower leg: No edema.     Left lower leg: No edema.  Skin:    General: Skin is warm and dry.  Neurological:     Mental Status: He is alert and oriented to person, place, and time.     GCS: GCS eye subscore is 4. GCS verbal subscore is 5. GCS motor subscore is 6.     Coordination: Coordination normal.  Psychiatric:        Mood and Affect: Mood normal.     ED Results / Procedures / Treatments   Labs (all labs ordered are listed, but only abnormal results are displayed) Labs Reviewed  BASIC METABOLIC PANEL - Abnormal; Notable for the following components:      Result Value   Sodium 130 (*)    Chloride 95 (*)    Glucose, Bld 102 (*)    All other components within normal limits  CBC - Abnormal; Notable for the following components:   WBC 10.6 (*)    All other components within normal limits  HEPATIC FUNCTION PANEL - Abnormal; Notable for the following components:   Indirect Bilirubin 1.0 (*)    All other components within normal limits  CK  URINALYSIS, ROUTINE W REFLEX MICROSCOPIC  TROPONIN I (HIGH SENSITIVITY)    EKG EKG Interpretation  Date/Time:  Saturday March 07 2020 15:09:03 EDT Ventricular Rate:  55 PR Interval:    QRS Duration: 114 QT Interval:  449 QTC Calculation: 430 R Axis:   65 Text Interpretation: Sinus rhythm Borderline intraventricular conduction delay no acute ischemic changes. no sig  change from older tracings. Confirmed by Charlesetta Shanks [phone]) on 03/07/2020 3:25:25 PM   Radiology No results found.  Procedures Procedures (including critical care time)  Medications Ordered in ED Medications  sodium chloride 0.9 % bolus 500 mL (has no administration in time range)    ED Course  I have reviewed the triage vital signs and the nursing notes.  Pertinent labs & imaging results that were  available during my care of the patient were reviewed by me and considered in my medical decision making (see chart for details).    MDM Rules/Calculators/A&P                          Patient is an excellent baseline physical condition.  He ran a 5K this morning.  It is very hot out today.  Patient has slightly decrease his oral intake due to recent diarrheal condition.  At this time I suspect he has mild dehydration and overexertion due to combination of recent diarrhea and significant physical exertion this morning.  Patient had no focal symptoms to suggest MI or stroke.  CK is within normal limits and renal function is normal.  Will rehydrate with 500 cc normal saline.  Patient advised of slightly low sodium and necessity to increase sodium intake and drink electrolyte containing fluids. Return precautions reviewed.  Patient's troponin just mildly elevated at 29.  Will repeat.  Dr. Laverta Baltimore to follow-up on repeat value.  If stable, appropriate for discharge. Final Clinical Impression(s) / ED Diagnoses Final diagnoses:  Dehydration after exertion    Rx / DC Orders ED Discharge Orders    None       Charlesetta Shanks, MD 03/07/20 1448    Charlesetta Shanks, MD 03/07/20 1607

## 2020-03-07 NOTE — ED Triage Notes (Signed)
Completed a 5K run, became very weak post run, states had difficulty standing. Only had Toast/ Honey for breakfast, Family states he has only drink about half a bottle water since episode

## 2020-03-10 ENCOUNTER — Other Ambulatory Visit (INDEPENDENT_AMBULATORY_CARE_PROVIDER_SITE_OTHER): Payer: Medicare Other

## 2020-03-10 ENCOUNTER — Other Ambulatory Visit: Payer: Self-pay

## 2020-03-10 ENCOUNTER — Ambulatory Visit (HOSPITAL_BASED_OUTPATIENT_CLINIC_OR_DEPARTMENT_OTHER)
Admission: RE | Admit: 2020-03-10 | Discharge: 2020-03-10 | Disposition: A | Discharge status: Home / Self Care | Payer: Medicare Other | Source: Ambulatory Visit | Attending: Medical | Admitting: Medical

## 2020-03-10 ENCOUNTER — Ambulatory Visit (INDEPENDENT_AMBULATORY_CARE_PROVIDER_SITE_OTHER): Payer: Medicare Other | Admitting: Medical

## 2020-03-10 VITALS — BP 129/77 | HR 50 | Resp 18 | Ht 70.0 in | Wt 150.8 lb

## 2020-03-10 DIAGNOSIS — R079 Chest pain, unspecified: Secondary | ICD-10-CM | POA: Diagnosis not present

## 2020-03-10 DIAGNOSIS — I2510 Atherosclerotic heart disease of native coronary artery without angina pectoris: Secondary | ICD-10-CM | POA: Diagnosis not present

## 2020-03-10 DIAGNOSIS — R0789 Other chest pain: Secondary | ICD-10-CM | POA: Diagnosis not present

## 2020-03-10 DIAGNOSIS — R0781 Pleurodynia: Secondary | ICD-10-CM

## 2020-03-10 DIAGNOSIS — M940 Chondrocostal junction syndrome [Tietze]: Secondary | ICD-10-CM

## 2020-03-10 DIAGNOSIS — R778 Other specified abnormalities of plasma proteins: Secondary | ICD-10-CM

## 2020-03-10 DIAGNOSIS — R5383 Other fatigue: Secondary | ICD-10-CM

## 2020-03-10 DIAGNOSIS — I2583 Coronary atherosclerosis due to lipid rich plaque: Secondary | ICD-10-CM | POA: Diagnosis not present

## 2020-03-10 LAB — TROPONIN I (HIGH SENSITIVITY): High Sens Troponin I: 7 ng/L (ref 2–17)

## 2020-03-10 LAB — COMPREHENSIVE METABOLIC PANEL
ALT: 19 U/L (ref 0–53)
AST: 23 U/L (ref 0–37)
Albumin: 3.9 g/dL (ref 3.5–5.2)
Alkaline Phosphatase: 52 U/L (ref 39–117)
BUN: 12 mg/dL (ref 6–23)
CO2: 29 mEq/L (ref 19–32)
Calcium: 8.7 mg/dL (ref 8.4–10.5)
Chloride: 100 mEq/L (ref 96–112)
Creatinine, Ser: 0.97 mg/dL (ref 0.40–1.50)
GFR: 74.42 mL/min (ref >60.00)
Glucose, Bld: 93 mg/dL (ref 70–99)
Potassium: 3.9 mEq/L (ref 3.5–5.1)
Sodium: 134 mEq/L — ABNORMAL LOW (ref 135–145)
Total Bilirubin: 0.5 mg/dL (ref 0.2–1.2)
Total Protein: 6.8 g/dL (ref 6.0–8.3)

## 2020-03-10 MED ORDER — MECLIZINE HCL 12.5 MG PO TABS: 12.5000 mg | ORAL_TABLET | Freq: Three times a day (TID) | ORAL | 0 refills | Status: DC | PRN

## 2020-03-10 NOTE — Patient Instructions (Signed)
Patient in for follow-up from the emergency department.  He suffered from dehydration from combination of loose stools for approximately 3 to 4 weeks and then running a 5K.  Work-up in the ED showed he was dehydrated and he got fluids.  He had mild low sodium.  He had normal EKG but troponin was mildly elevated twice.  The second repeat was not significantly elevated/changed per ED.  He was discharged to follow-up today.  He states overall he is feeling better.  Has more energy and less dizzy.  However still feeling slight fatigue and intermittently dizzy.  He reports that meclizine does help.  He is not reporting any headache or any gross motor/sensory function deficits.  EKG today shows sinus rhythm but mild bradycardia.  Last EKG showed mild bradycardia as well as a does exercise a lot.  He describes some pleuritic pain on the left side and appears to have some costochondral junction region pain on deep inspiration and twisting his thorax.  Will get a chest x-ray today.  Repeating both CMP and troponin stat.  Shows low [place] location since I want high-sensitivity troponin.  Refill patient's meclizine.  We will notify patient on lab results and EKG.  Patient is already scheduled to follow-up with cardiology next week.  Will advise someone to follow-up with PCP after reviewing studies today.

## 2020-03-10 NOTE — Progress Notes (Addendum)
Subjective:    Patient ID: James [name], male    DOB: [birth year], 79 y.o.   MRN: [id number]  HPI Pt in for hospitalization.  Pt had diarrhea since first of September. Pt states was having loose stools about 5-7 times a day. His work up was negative and was going to get colonoscopy.  Pt ran 5 k on Saturday. Was very hot and humid. After race he was extremely dizzy and almost passed out.   During the race his pulse went to 180. After race he went to ED and was hydrated.   Pt troponin study was elevated. Told to follow up and confirm decrease.  Pt ekg showed nsr.  Pt states still feels weak and faint dizziness.  Pt had low na in ED.  Pt has some faint costochondral junction pain on twisting thorax and taking deep breath. But otherwise no pain.    Review of Systems  Constitutional: Positive for fatigue. Negative for chills.  Respiratory: Negative for cough, chest tightness and shortness of breath.   Cardiovascular: Negative for chest pain and palpitations.       Pain left side costochondral junction on deep breathing and twisting thorax.  Gastrointestinal: Negative for abdominal pain.  Musculoskeletal:       Mid upper left side back pain on taking deep breath.  Skin: Negative for rash.  Neurological: Positive for dizziness. Negative for speech difficulty, weakness and numbness.       Mild occasional dizzy but less than over weekend. Responds to meclizine.  Hematological: Negative for adenopathy. Does not bruise/bleed easily.  Psychiatric/Behavioral: Negative for behavioral problems and dysphoric mood.    Past Medical History:  Diagnosis Date  . AAA (abdominal aortic aneurysm) without rupture ([place]) 09/08/2013  . Abdominal bloating 05/19/2016  . Anxiety state 05/18/2014  . Atrial fibrillation ([place])   . 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 Benign paroxysmal positional vertigo 04/06/2013  . CAD (coronary artery disease)    nonobstructive by cath 2008;  nonobstructive by Cardiac CT 11/2011  . Cervical spine fracture ([facility]) 1980   C6-7  . Diverticulosis   . Erectile dysfunction 09/08/2013  . GERD (gastroesophageal reflux disease)   . Hiatal hernia   . History of atrial fibrillation   . History of squamous cell carcinoma 10-05-10   Dr [name], [place]  . HLD (hyperlipidemia)   . Hypothyroidism   . Medicare annual wellness visit, subsequent 03/16/2014   Sees [facility] Follows with Dr Johnsie Cancel of cardiology Sees [facility] for colonoscopy, last colonoscopy in 2013   . Mitral valve prolapse   . Neck pain 05/03/2015  . Preventative health care 03/16/2014  . 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 Syncope 02/03/2013  . 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     Social History   Socioeconomic History  . Marital status: Single    Spouse name: Not on file  . Number of children: Not on file  . Years of education: Not on file  . Highest education level: Not on file  Occupational History  . Occupation: Chief Financial Officer    Comment: Retired  Tobacco Use  . Smoking status: Never Smoker  . Smokeless tobacco: Never Used  Substance and Sexual Activity  . Alcohol use: Yes    Comment: 2 glasses red wine/day  . Drug use: No  . Sexual activity: Yes  Other Topics Concern  . Not on file  Social History Narrative  . Not on file  Social Determinants of Health   Financial Resource Strain:   . Difficulty of Paying Living Expenses: Not on file  Food Insecurity:   . Worried About Charity fundraiser in the Last Year: Not on file  . Ran Out of Food in the Last Year: Not on file  Transportation Needs:   . Lack of Transportation (Medical): Not on file  . Lack of Transportation (Non-Medical): Not on file  Physical Activity:   . Days of Exercise per Week: Not on file  . Minutes of Exercise per Session: Not on file  Stress:   . Feeling of Stress : Not on file  Social Connections:   . Frequency of Communication with Friends  and Family: Not on file  . Frequency of Social Gatherings with Friends and Family: Not on file  . Attends Religious Services: Not on file  . Active Member of Clubs or Organizations: Not on file  . Attends Archivist Meetings: Not on file  . Marital Status: Not on file  Intimate Partner Violence:   . Fear of Current or Ex-Partner: Not on file  . Emotionally Abused: Not on file  . Physically Abused: Not on file  . Sexually Abused: Not on file    Past Surgical History:  Procedure Laterality Date  . APPENDECTOMY  05/2011  . CARDIAC CATHETERIZATION  01/03/12   30% ostial LAD stenosis, ostial 30-40% D1 stenosis, smooth and normal left main and RCA; LVEF 65%  . CHOLECYSTECTOMY N/A 08/25/2014   Procedure: LAPAROSCOPIC CHOLECYSTECTOMY WITH INTRAOPERATIVE CHOLANGIOGRAM;  Surgeon: [name], MD;  Location: [facility];  Service: General;  Laterality: N/A;  . INGUINAL HERNIA REPAIR  07/2010   right  . KNEE ARTHROPLASTY     bilat  . LEFT HEART CATHETERIZATION WITH CORONARY ANGIOGRAM N/A 01/03/2012   Procedure: LEFT HEART CATHETERIZATION WITH CORONARY ANGIOGRAM;  Surgeon: [name], MD;  Location: [facility] CATH LAB;  Service: Cardiovascular;  Laterality: N/A;  . MRI  10/31/2016  . TONSILLECTOMY  1947  . tumor removal  1958   "fatty tumor"    Family History  Problem Relation Age of Onset  . Heart disease Mother        MI 74s  . Stroke Mother   . Hypertension Mother   . Heart disease Maternal Grandfather        MI 70-80s  . Heart disease Maternal Grandmother        MI 70-80s  . Lung disease Father        penumonitis  . Cancer Sister        colon  . Cancer Paternal Uncle        GI: colon he thinks    Allergies  Allergen Reactions  . Sulfa Antibiotics Other (See Comments)    Childhood allergy    Current Outpatient Medications on File Prior to Visit  Medication Sig Dispense Refill  . ALPRAZolam (XANAX) 0.25 MG tablet TAKE 1 TABLET BY MOUTH TWICE DAILY AS NEEDED FOR  ANXIETY 20 tablet 1  . aspirin EC 81 MG tablet Take 81 mg by mouth 2 (two) times a week. About once a twice a week    . atorvastatin (LIPITOR) 20 MG tablet TAKE 1 TABLET BY MOUTH EVERY DAY AT 6PM FOR CHOLESTEROL 90 tablet 0  . Calcium Carb-Cholecalciferol (CALCIUM 1000 + D PO) Take 1 tablet by mouth daily.    . Dutasteride-Tamsulosin HCl 0.5-0.4 MG CAPS Take 1 capsule by mouth at bedtime. 90 capsule 0  .  ELDERBERRY PO Take by mouth.    . levothyroxine (SYNTHROID) 100 MCG tablet Take 1 tablet (100 mcg total) by mouth daily before breakfast. 90 tablet 0  . LORazepam (ATIVAN) 0.5 MG tablet TAKE 1 TABLET BY MOUTH 2 TIMES A DAY AS NEEDED FOR ANXIETY 30 tablet 1  . Milk Thistle 1000 MG CAPS Take 1 capsule by mouth daily.    . Multiple Vitamins-Minerals (MULTIVITAMINS THER. W/MINERALS) TABS Take 1 tablet by mouth daily.    . pantoprazole (PROTONIX) 40 MG tablet TAKE 1 TABLET BY MOUTH 2 TIMES A DAY BEFORE MEALS FOR ACID REFLUX 60 tablet 0  . Probiotic Product (PROBIOTIC DAILY PO) Take by mouth.    . sildenafil (REVATIO) 20 MG tablet TAKE 1 TO 5 TABLETS BY MOUTH EVERY DAY AS NEEDED 35 tablet 1  . Testosterone 30 MG/ACT SOLN APPLY 1 PUMP UNDER EACH ARM EVERY MORNING AS DIRECTED 180 mL 0  . venlafaxine XR (EFFEXOR XR) 37.5 MG 24 hr capsule Take 1 capsule (37.5 mg total) by mouth daily with breakfast. 30 capsule 1  . zolpidem (AMBIEN) 10 MG tablet TAKE 1 TABLET BY MOUTH EVERY NIGHT AT BEDTIME AS NEEDED FOR SLEEP 30 tablet 2   No current facility-administered medications on file prior to visit.    BP 129/77   Pulse (!) 50   Resp 18   Ht 5\' 10"  (1.778 m)   Wt 150 lb 12.8 oz (68.4 kg)   SpO2 100%   BMI 21.64 kg/m       Objective:   Physical Exam  General Mental Status- Alert. General Appearance- Not in acute distress.   Skin General: Color- Normal Color. Moisture- Normal Moisture.  Neck Carotid Arteries- Normal color. Moisture- Normal Moisture. No carotid bruits. No JVD.  Chest and Lung  Exam Auscultation: Breath Sounds:-Normal.  Cardiovascular Auscultation:Rythm- Regular. Murmurs & Other Heart Sounds:Auscultation of the heart reveals- No Murmurs.  Abdomen Inspection:-Inspeection Normal. Palpation/Percussion:Note:No mass. Palpation and Percussion of the abdomen reveal- Non Tender, Non Distended + BS, no rebound or guarding.  Neurologic Cranial Nerve exam:- CN III-XII intact(No nystagmus), symmetric smile. Strength:- 5/5 equal and symmetric strength both upper and lower extremities.  Anterior thorax- left side costochondral junction tender. On twisting thorax left side costochandral junction tender.    Assessment & Plan:  Patient in for follow-up from the emergency department.  He suffered from dehydration from combination of loose stools for approximately 3 to 4 weeks and then running a 5K.  Work-up in the ED showed he was dehydrated and he got fluids.  He had mild low sodium.  He had normal EKG but troponin was mildly elevated twice.  The second repeat was not significantly elevated/changed per ED.  He was discharged to follow-up today.  He states overall he is feeling better.  Has more energy and less dizzy.  However still feeling slight fatigue and intermittently dizzy.  He reports that meclizine does help.  He is not reporting any headache or any gross motor/sensory function deficits.  EKG today shows sinus rhythm but mild bradycardia.  Last EKG showed mild bradycardia as well as a does exercise a lot.  He describes some pleuritic pain on the left side and appears to have some costochondral junction region pain on deep inspiration and twisting his thorax.  Will get a chest x-ray today.  Repeating both CMP and troponin stat.  Shows low [place] location since I want high-sensitivity troponin.  Refill patient's meclizine.  We will notify patient on  lab results and EKG.  Patient is already scheduled to follow-up with cardiology next week.  Will advise  someone to follow-up with PCP after reviewing studies today.  Time spent with patient today was 40  minutes which consisted of chart review/ED review, discussing diagnosis, work up, treatment,, and documentation.

## 2020-03-12 ENCOUNTER — Other Ambulatory Visit: Payer: Self-pay | Admitting: Family Medicine

## 2020-03-13 NOTE — Telephone Encounter (Signed)
Last written:01/14/20 Last ov:02/25/20 Next ov:04/23/20 Contract:none UDS:01/09/18

## 2020-03-15 NOTE — Progress Notes (Addendum)
Cardiology Office Note:    Date:  03/17/2020   ID:  James [name], DOB May 27, 1941, MRN [id number]  PCP:  [name], MD  [facility] Cardiologist:  No primary care provider on file.  [facility] Electrophysiologist:  None   Referring MD: [name], MD    History of Present Illness:    James [name] is a 79 y.o. male with a hx of AAA, nonobstructive CAD, and HLD who was referred by Dr. Charlett Blake for concern for   Recently in ED on 03/07/20. Note reviewed. Patient presented after running 5K with significant lightheadedness. Laid down on pavement for 8min before able to get up. No LOC. Just felt weak and exhausted. No chest pain/pressure or SOB. Had diarrhea for several weeks prior leading up to the run having on average 6 episodes per day. ECG with NSR without ischemic changes.Trop 29-->32. Thought to be due to dehydration. Discharged home with Cardiology follow-up.  Today, the patient states he is continuing to have 6 episodes of diarrhea per day. He is maintaining good hydration with water, pedialyte and gatorade. Repeat labs on 03/10/20 showed that his Na returned to normal 130-->134. Cr 0.97. Other than having diarrhea and feeling drained of energy since the frequent stooling started, he denies chest pressure/pain, DOE, palpitations, LE edema, PND or orthopnea. He is very active at baseline and runs daily. He has felt more tired lately with the diarrhea and after he lost his best friend, but prior to that, he was doing well and was "able to push himself" with no symptoms. He is hoping to go for a colonoscopy for work-up of his diarrhea but wanted to be cleared from Cardiology standpoint.  Cath 2013: 30-40% ostial LAD, otherwise no significant disease. Normal EF on LV gram. Event monitor 2013: sinus tachycardia with PVCs  Past Medical History:  Diagnosis Date  . AAA (abdominal aortic aneurysm) without rupture ([place]) 09/08/2013  . Abdominal bloating 05/19/2016  . Anxiety state  05/18/2014  . Atrial fibrillation ([place])   . 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 Benign paroxysmal positional vertigo 04/06/2013  . CAD (coronary artery disease)    nonobstructive by cath 2008; nonobstructive by Cardiac CT 11/2011  . Cervical spine fracture (HCC) 1980   C6-7  . Diverticulosis   . Erectile dysfunction 09/08/2013  . GERD (gastroesophageal reflux disease)   . Hiatal hernia   . History of atrial fibrillation   . History of squamous cell carcinoma 10-05-10   Dr [name], [place]  . HLD (hyperlipidemia)   . Hypothyroidism   . Medicare annual wellness visit, subsequent 03/16/2014   Sees [facility] Follows with Dr Johnsie Cancel of cardiology Sees [facility] for colonoscopy, last colonoscopy in 2013   . Mitral valve prolapse   . Neck pain 05/03/2015  . Preventative health care 03/16/2014  . 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 Syncope 02/03/2013  . 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    Past Surgical History:  Procedure Laterality Date  . APPENDECTOMY  05/2011  . CARDIAC CATHETERIZATION  01/03/12   30% ostial LAD stenosis, ostial 30-40% D1 stenosis, smooth and normal left main and RCA; LVEF 65%  . CHOLECYSTECTOMY N/A 08/25/2014   Procedure: LAPAROSCOPIC CHOLECYSTECTOMY WITH INTRAOPERATIVE CHOLANGIOGRAM;  Surgeon: [name], MD;  Location: [facility];  Service: General;  Laterality: N/A;  . INGUINAL HERNIA REPAIR  07/2010   right  . KNEE ARTHROPLASTY     bilat  .  LEFT HEART CATHETERIZATION WITH CORONARY ANGIOGRAM N/A 01/03/2012   Procedure: LEFT HEART CATHETERIZATION WITH CORONARY ANGIOGRAM;  Surgeon: [name], MD;  Location: [facility] CATH LAB;  Service: Cardiovascular;  Laterality: N/A;  . MRI  10/31/2016  . TONSILLECTOMY  1947  . tumor removal  1958   "fatty tumor"    Current Medications: Current Meds  Medication Sig  . ALPRAZolam (XANAX) 0.25 MG tablet TAKE 1 TABLET BY MOUTH TWICE DAILY AS NEEDED FOR ANXIETY  . aspirin EC  81 MG tablet Take 81 mg by mouth. About once a five times a week  . atorvastatin (LIPITOR) 20 MG tablet TAKE 1 TABLET BY MOUTH EVERY DAY AT 6PM FOR CHOLESTEROL  . Calcium Carb-Cholecalciferol (CALCIUM 1000 + D PO) Take 1 tablet by mouth 3 (three) times a week.   . Cyanocobalamin (VITAMIN B 12) 500 MCG TABS Take 1 tablet by mouth daily.  . diphenhydrAMINE (BENADRYL) 25 MG tablet Take 25 mg by mouth at bedtime as needed.  . Dutasteride-Tamsulosin HCl 0.5-0.4 MG CAPS Take 1 capsule by mouth at bedtime.  Marland Kitchen ELDERBERRY PO Take by mouth.  . levothyroxine (SYNTHROID) 100 MCG tablet Take 1 tablet (100 mcg total) by mouth daily before breakfast.  . LORazepam (ATIVAN) 0.5 MG tablet TAKE 1 TABLET BY MOUTH 2 TIMES A DAY AS NEEDED FOR ANXIETY  . meclizine (ANTIVERT) 12.5 MG tablet Take 1 tablet (12.5 mg total) by mouth 3 (three) times daily as needed for dizziness.  . Melatonin 10 MG TABS Take 1 tablet by mouth at bedtime as needed.  . Milk Thistle 1000 MG CAPS Take 1 capsule by mouth daily.  . Multiple Vitamins-Minerals (MULTIVITAMINS THER. W/MINERALS) TABS Take 1 tablet by mouth daily.  . pantoprazole (PROTONIX) 40 MG tablet TAKE 1 TABLET BY MOUTH 2 TIMES A DAY BEFORE MEALS FOR ACID REFLUX  . Probiotic Product (PROBIOTIC DAILY PO) Take by mouth.  . sildenafil (REVATIO) 20 MG tablet TAKE 1 TO 5 TABLETS BY MOUTH EVERY DAY AS NEEDED  . Testosterone 30 MG/ACT SOLN APPLY 1 PUMP UNDER EACH ARM EVERY MORNING AS DIRECTED  . venlafaxine XR (EFFEXOR XR) 37.5 MG 24 hr capsule Take 1 capsule (37.5 mg total) by mouth daily with breakfast.  . zolpidem (AMBIEN) 10 MG tablet TAKE 1 TABLET BY MOUTH EVERY NIGHT AT BEDTIME AS NEEDED FOR SLEEP     Allergies:   Sulfa antibiotics   Social History   Socioeconomic History  . Marital status: Single    Spouse name: Not on file  . Number of children: Not on file  . Years of education: Not on file  . Highest education level: Not on file  Occupational History  . Occupation:  Chief Financial Officer    Comment: Retired  Tobacco Use  . Smoking status: Never Smoker  . Smokeless tobacco: Never Used  Substance and Sexual Activity  . Alcohol use: Yes    Comment: 2 glasses red wine/day  . Drug use: No  . Sexual activity: Yes  Other Topics Concern  . Not on file  Social History Narrative  . Not on file   Social Determinants of Health   Financial Resource Strain:   . Difficulty of Paying Living Expenses: Not on file  Food Insecurity:   . Worried About Charity fundraiser in the Last Year: Not on file  . Ran Out of Food in the Last Year: Not on file  Transportation Needs:   . Lack of Transportation (Medical): Not on file  . Lack  of Transportation (Non-Medical): Not on file  Physical Activity:   . Days of Exercise per Week: Not on file  . Minutes of Exercise per Session: Not on file  Stress:   . Feeling of Stress : Not on file  Social Connections:   . Frequency of Communication with Friends and Family: Not on file  . Frequency of Social Gatherings with Friends and Family: Not on file  . Attends Religious Services: Not on file  . Active Member of Clubs or Organizations: Not on file  . Attends Archivist Meetings: Not on file  . Marital Status: Not on file     Family History: The patient's family history includes Cancer in his paternal uncle and sister; Heart disease in his maternal grandfather, maternal grandmother, and mother; Hypertension in his mother; Lung disease in his father; Stroke in his mother.  ROS:   Please see the history of present illness.     Positive for diarrhea, fatigue, and episode of lightheadedness. The patient denies chest pain, chest pressure, dyspnea at rest or with exertion, palpitations, PND, orthopnea, or leg swelling. Denies cough, fever, chills. Denies nausea, vomiting.  Denies snoring.  EKGs/Labs/Other Studies Reviewed:    The following studies were reviewed today: Cath 2013: Hemodynamics:  LV pressure: 112/8 Aortic  pressure: 110/55 Angiography  Left Main:  Smooth and normal Left anterior Descending: mild calcification.  There is a 30% stenosis at the origen of the LAD.  The remaining LAD is smooth and normal.  The 1st diag. Has a 30% - 40% stenosis at the origin. Left Circumflex: large.  Provides primarily a 1st OM distribution.  Smooth and normal. Right Coronary Artery: large, dominant, smooth and normal LV Gram: normal LV function. EF 33% Complications: No apparent complications Patient did tolerate procedure well.  Conclusions:   1. Minor luminal irregularities.  No flow limiting stenosis. 2. Normal LV function.  TTE 2014: Study Conclusions  - Left ventricle: The cavity size was normal. Wall thickness  was normal. Systolic function was normal. The estimated  ejection fraction was in the range of 55% to 60%. Doppler  parameters are consistent with abnormal left ventricular  relaxation (grade 1 diastolic dysfunction).  - Aorta: Ascending aorta is mildly dilated at 41 mm.  - Mitral valve: Mild regurgitation.  - Right ventricle: Systolic function was mildly reduced.  Nuclear stress 2013: Normal EF, no ischemia or infarction  Cath 2008 LAD ostial 30%, mid 40-50%, RCA dominant with mild plaquing. Non-obstructive CAD.  Carotid doppler 2014: Mild plaquing (1-39%) bilaterally  Abdominal ultrasound 2016:  FINDINGS: Gallbladder: Mild echogenic material is noted in a incompletely distended gallbladder likely representing sludge. No wall thickening or pericholecystic fluid is noted  Common bile duct: Diameter: 4 mm  Liver: There is a ovoid area of decreased echogenicity identified in the right lobe of the liver anteriorly which measures 1.8 cm. This was not well appreciated on the prior exam and likely represents an area of focal fatty sparing. Calcified granulomas are noted as well.  IVC: No abnormality visualized.  Pancreas: Visualized portion unremarkable.  Spleen:  Multiple calcified granulomas are noted.  Right Kidney: Length: 10.5 cm. Echogenicity within normal limits. No mass or hydronephrosis visualized.  Left Kidney: Length: 11.9 cm. Echogenicity within normal limits. No mass or hydronephrosis visualized.  Abdominal aorta: 3.0 cm in the proximal aorta although there is normal distal tapering.  Other findings: None. IMPRESSION: Hypoechoic area within the right lobe of the liver likely representing focal fatty sparing. Findings  of prior granulomatous disease. Gallbladder sludge. Ectasia of the proximal aorta although normal distal tapering is noted. Recommend followup by ultrasound in 3 years. This recommendation follows ACR consensus guidelines: White Paper of the ACR Incidental Findings Committee II on Vascular Findings. Natasha Mead Coll Radiol 2013; [phone]   EKG:   ECG 03/09/20: NSR, early r-wave progression (transitions in V2), borderline IVCD, no block or evidence of ischemia  Recent Labs: 09/17/2019: TSH 0.60 03/07/2020: Hemoglobin 14.7; Platelets 199 03/10/2020: ALT 19; BUN 12; Creatinine, Ser 0.97; Potassium 3.9; Sodium 134  Recent Lipid Panel    Component Value Date/Time   CHOL 111 09/17/2019 1123   TRIG 78.0 09/17/2019 1123   HDL 49.60 09/17/2019 1123   CHOLHDL 2 09/17/2019 1123   VLDL 15.6 09/17/2019 1123   LDLCALC 46 09/17/2019 1123    Physical Exam:    VS:  BP (!) 112/58   Pulse 60   Ht 5\' 10"  (1.778 m)   Wt 149 lb (67.6 kg)   SpO2 98%   BMI 21.38 kg/m     Wt Readings from Last 3 Encounters:  03/17/20 149 lb (67.6 kg)  03/10/20 150 lb 12.8 oz (68.4 kg)  03/07/20 145 lb (65.8 kg)     GEN:  Well nourished, well developed in no acute distress HEENT: Normal NECK: No JVD; No carotid bruits LYMPHATICS: No lymphadenopathy CARDIAC: Bradycardic, no murmurs, rubs, gallops RESPIRATORY:  Clear to auscultation without rales, wheezing or rhonchi  ABDOMEN: Soft, non-tender, non-distended MUSCULOSKELETAL:  No  edema; No deformity  SKIN: Warm and dry NEUROLOGIC:  Alert and oriented x 3 PSYCHIATRIC:  Normal affect   ASSESSMENT:    1. Elevated troponin   2. AAA (abdominal aortic aneurysm) without rupture ([place])   3. Thoracic aortic aneurysm without rupture ([place])   4. Lightheadedness    PLAN:    In order of problems listed above:  #Non-obstructive CAD: #Mild trop elevation Cath 2013 with LAD ostial 30-40% otherwise no significant disease. Very, very active and runs several miles daily as well as trains for marathons. Has been feeling off due to diarrhea for the past several weeks but no chest pain or SOB with exertion. Trop elevation likely demand in the setting of significant dehydration (had been having diarrhea 6x/day for several weeks) and strain with exercise (HR was 170s when he was pushing himself running). 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  -Continue medical management with ASA and statin  #Mild MR: On TTE in 2014. -Check TTE as above   Medication Adjustments/Labs and Tests Ordered: Current medicines are reviewed at length with the patient today.  Concerns regarding medicines are outlined above.  Orders Placed This Encounter  Procedures  . NM  Myocar Multi W/Spect W/Wall Motion / EF  . ECHOCARDIOGRAM COMPLETE  . VAS [place] AAA DUPLEX   No orders of the defined types  were placed in this encounter.   Patient Instructions  Medication Instructions:  Your physician recommends that you continue on your current medications as directed. Please refer to the Current Medication list given to you today.  *If you need a refill on your cardiac medications before your next appointment, please call your pharmacy*   Lab Work: None  If you have labs (blood work) drawn today and your tests are completely normal, you will receive your results only by: Marland Kitchen MyChart Message (if you have MyChart) OR . A paper copy in the mail If you have any lab test that is abnormal or we need to change your treatment, we will call you to review the results.   Testing/Procedures: Your physician has requested that you have an echocardiogram. Echocardiography is a painless test that uses sound waves to create images of your heart. It provides your doctor with information about the size and shape of your heart and how well your heart's chambers and valves are working. This procedure takes approximately one hour. There are no restrictions for this procedure.  Your physician has requested that you have an abdominal aorta duplex. During this test, an ultrasound is used to evaluate the aorta. Allow 30 minutes for this exam. Do not eat after midnight the day before and avoid carbonated beverages  Follow-Up: At [facility], you and your health needs are our priority.  As part of our continuing mission to provide you with exceptional heart care, we have created designated Provider Care Teams.  These Care Teams include your primary Cardiologist (physician) and Advanced Practice Providers (APPs -  Physician Assistants and Nurse Practitioners) who all work together to provide you with the care you need, when you need it.  We recommend signing up for the patient portal called  "MyChart".  Sign up information is provided on this After Visit Summary.  MyChart is used to connect with patients for Virtual Visits (Telemedicine).  Patients are able to view lab/test results, encounter notes, upcoming appointments, etc.  Non-urgent messages can be sent to your provider as well.   To learn more about what you can do with MyChart, go to [email or web address].    Your next appointment:   3 month(s)  The format for your next appointment:   In Person  Provider:   Gwyndolyn Kaufman, MD   Other Instructions None     Signed, Freada Bergeron, MD  03/17/2020 10:50 AM    [place]

## 2020-03-17 ENCOUNTER — Telehealth (HOSPITAL_COMMUNITY): Payer: Self-pay | Admitting: *Deleted

## 2020-03-17 ENCOUNTER — Telehealth: Payer: Self-pay | Admitting: Nurse Practitioner

## 2020-03-17 ENCOUNTER — Ambulatory Visit (INDEPENDENT_AMBULATORY_CARE_PROVIDER_SITE_OTHER): Payer: Medicare Other | Admitting: Cardiology

## 2020-03-17 ENCOUNTER — Encounter: Payer: Self-pay | Admitting: Cardiology

## 2020-03-17 ENCOUNTER — Encounter (HOSPITAL_COMMUNITY): Payer: Self-pay | Admitting: *Deleted

## 2020-03-17 ENCOUNTER — Other Ambulatory Visit: Payer: Self-pay

## 2020-03-17 VITALS — BP 112/58 | HR 60 | Ht 70.0 in | Wt 149.0 lb

## 2020-03-17 DIAGNOSIS — R42 Dizziness and giddiness: Secondary | ICD-10-CM | POA: Diagnosis not present

## 2020-03-17 DIAGNOSIS — I714 Abdominal aortic aneurysm, without rupture, unspecified: Secondary | ICD-10-CM

## 2020-03-17 DIAGNOSIS — R778 Other specified abnormalities of plasma proteins: Secondary | ICD-10-CM

## 2020-03-17 DIAGNOSIS — I712 Thoracic aortic aneurysm, without rupture, unspecified: Secondary | ICD-10-CM

## 2020-03-17 DIAGNOSIS — I2510 Atherosclerotic heart disease of native coronary artery without angina pectoris: Secondary | ICD-10-CM

## 2020-03-17 NOTE — Patient Instructions (Signed)
Medication Instructions:  Your physician recommends that you continue on your current medications as directed. Please refer to the Current Medication list given to you today.  *If you need a refill on your cardiac medications before your next appointment, please call your pharmacy*   Lab Work: None  If you have labs (blood work) drawn today and your tests are completely normal, you will receive your results only by: Marland Kitchen MyChart Message (if you have MyChart) OR . A paper copy in the mail If you have any lab test that is abnormal or we need to change your treatment, we will call you to review the results.   Testing/Procedures: Your physician has requested that you have an echocardiogram. Echocardiography is a painless test that uses sound waves to create images of your heart. It provides your doctor with information about the size and shape of your heart and how well your heart's chambers and valves are working. This procedure takes approximately one hour. There are no restrictions for this procedure.  Your physician has requested that you have an abdominal aorta duplex. During this test, an ultrasound is used to evaluate the aorta. Allow 30 minutes for this exam. Do not eat after midnight the day before and avoid carbonated beverages  Follow-Up: At [facility], you and your health needs are our priority.  As part of our continuing mission to provide you with exceptional heart care, we have created designated Provider Care Teams.  These Care Teams include your primary Cardiologist (physician) and Advanced Practice Providers (APPs -  Physician Assistants and Nurse Practitioners) who all work together to provide you with the care you need, when you need it.  We recommend signing up for the patient portal called "MyChart".  Sign up information is provided on this After Visit Summary.  MyChart is used to connect with patients for Virtual Visits (Telemedicine).  Patients are able to view lab/test  results, encounter notes, upcoming appointments, etc.  Non-urgent messages can be sent to your provider as well.   To learn more about what you can do with MyChart, go to [email or web address].    Your next appointment:   3 month(s)  The format for your next appointment:   In Person  Provider:   Gwyndolyn Kaufman, MD   Other Instructions None

## 2020-03-17 NOTE — Addendum Note (Signed)
Addended by: Gwyndolyn Kaufman on: 03/17/2020 10:51 AM   Modules accepted: Orders

## 2020-03-17 NOTE — Telephone Encounter (Signed)
Patient's lexiscan myoview has been scheduled for 10/13.

## 2020-03-17 NOTE — Telephone Encounter (Signed)
Patient given detailed instructions per Myocardial Perfusion Study Information Sheet for the test on 03/19/2020 at 0715. Patient notified to arrive 15 minutes early and that it is imperative to arrive on time for appointment to keep from having the test rescheduled.  If you need to cancel or reschedule your appointment, please call the office within 24 hours of your appointment. . Patient verbalized understanding.[place] Mychart letter sent with instructions.

## 2020-03-17 NOTE — Telephone Encounter (Signed)
-----   Message from Freada Bergeron, MD sent at 03/17/2020 10:55 AM EDT ----- Do you mind calling the patient and scheduling nuclear stress test? I placed the order but want to make sure he is able to get his colonoscopy and think they may want a stress test beforehand due to the elevated troponin in the system and history of mild coronary artery disease.

## 2020-03-19 ENCOUNTER — Ambulatory Visit (HOSPITAL_COMMUNITY): Payer: Medicare Other | Attending: Internal Medicine

## 2020-03-19 ENCOUNTER — Other Ambulatory Visit: Payer: Self-pay

## 2020-03-19 DIAGNOSIS — R778 Other specified abnormalities of plasma proteins: Secondary | ICD-10-CM | POA: Insufficient documentation

## 2020-03-19 DIAGNOSIS — I2510 Atherosclerotic heart disease of native coronary artery without angina pectoris: Secondary | ICD-10-CM | POA: Insufficient documentation

## 2020-03-19 LAB — MYOCARDIAL PERFUSION IMAGING
LV dias vol: 131 mL (ref 62–150)
LV sys vol: 49 mL
Peak HR: 67 {beats}/min
Rest HR: 44 {beats}/min
SDS: 0
SRS: 0
SSS: 0
TID: 1.04

## 2020-03-19 MED ORDER — TECHNETIUM TC 99M TETROFOSMIN IV KIT
9.9000 | PACK | Freq: Once | INTRAVENOUS | Status: AC | PRN
  Administered 2020-03-19: 9.9 via INTRAVENOUS
  Filled 2020-03-19: qty 10

## 2020-03-19 MED ORDER — REGADENOSON 0.4 MG/5ML IV SOLN
0.4000 mg | Freq: Once | INTRAVENOUS | Status: AC
  Administered 2020-03-19: 0.4 mg via INTRAVENOUS

## 2020-03-19 MED ORDER — TECHNETIUM TC 99M TETROFOSMIN IV KIT
30.3000 | PACK | Freq: Once | INTRAVENOUS | Status: AC | PRN
  Administered 2020-03-19: 30.3 via INTRAVENOUS
  Filled 2020-03-19: qty 31

## 2020-03-19 NOTE — Progress Notes (Signed)
Patient with normal nuclear study with normal LVEF and no evidence of ischemia or infarction. He is cleared from a cardiovascular standpoint and medically optimized to undergo his colonoscopy. He is low risk for a low risk procedure.  Gwyndolyn Kaufman, MD

## 2020-03-20 ENCOUNTER — Other Ambulatory Visit: Payer: Self-pay | Admitting: Family Medicine

## 2020-03-20 DIAGNOSIS — I10 Essential (primary) hypertension: Secondary | ICD-10-CM

## 2020-03-20 NOTE — Telephone Encounter (Signed)
I have refilled his testosterone but his last lab for this was in march. Set him up for an appt some time between January and now and we can update labs.

## 2020-03-20 NOTE — Telephone Encounter (Signed)
Requesting: testosterone 30mg /act soln Contract:n/a UDS:n/a Last Visit: 03/10/2020 w/ Percell Miller Next Visit: None scheduled Last Refill: 01/14/2020 #151mL and 0RF Pt sig: 1 pump qd Last Lab: 09/17/2019   Please Advise

## 2020-03-23 NOTE — Telephone Encounter (Signed)
Appointment has been scheduled for Virtual visit in Jan 2022

## 2020-03-24 DIAGNOSIS — Z86010 Personal history of colonic polyps: Secondary | ICD-10-CM | POA: Diagnosis not present

## 2020-03-24 DIAGNOSIS — K529 Noninfective gastroenteritis and colitis, unspecified: Secondary | ICD-10-CM | POA: Diagnosis not present

## 2020-03-24 DIAGNOSIS — D125 Benign neoplasm of sigmoid colon: Secondary | ICD-10-CM | POA: Diagnosis not present

## 2020-03-24 DIAGNOSIS — K635 Polyp of colon: Secondary | ICD-10-CM | POA: Diagnosis not present

## 2020-03-24 DIAGNOSIS — K52832 Lymphocytic colitis: Secondary | ICD-10-CM | POA: Diagnosis not present

## 2020-03-24 DIAGNOSIS — K5730 Diverticulosis of large intestine without perforation or abscess without bleeding: Secondary | ICD-10-CM | POA: Diagnosis not present

## 2020-03-24 DIAGNOSIS — D131 Benign neoplasm of stomach: Secondary | ICD-10-CM | POA: Diagnosis not present

## 2020-03-24 DIAGNOSIS — R197 Diarrhea, unspecified: Secondary | ICD-10-CM | POA: Diagnosis not present

## 2020-03-24 DIAGNOSIS — K317 Polyp of stomach and duodenum: Secondary | ICD-10-CM | POA: Diagnosis not present

## 2020-03-24 LAB — HM COLONOSCOPY

## 2020-03-31 ENCOUNTER — Telehealth: Payer: Self-pay

## 2020-03-31 NOTE — Telephone Encounter (Signed)
PA initiated via Covermymeds; KEY: [id number]. Awaiting determination.

## 2020-04-01 ENCOUNTER — Encounter (HOSPITAL_COMMUNITY): Payer: Medicare Other

## 2020-04-01 ENCOUNTER — Other Ambulatory Visit: Payer: Self-pay

## 2020-04-01 ENCOUNTER — Ambulatory Visit (HOSPITAL_COMMUNITY)
Admission: RE | Admit: 2020-04-01 | Discharge: 2020-04-01 | Disposition: A | Discharge status: Home / Self Care | Payer: Medicare Other | Source: Ambulatory Visit | Attending: Cardiology | Admitting: Cardiology

## 2020-04-01 ENCOUNTER — Ambulatory Visit (HOSPITAL_BASED_OUTPATIENT_CLINIC_OR_DEPARTMENT_OTHER): Payer: Medicare Other

## 2020-04-01 DIAGNOSIS — I712 Thoracic aortic aneurysm, without rupture, unspecified: Secondary | ICD-10-CM

## 2020-04-01 DIAGNOSIS — I714 Abdominal aortic aneurysm, without rupture, unspecified: Secondary | ICD-10-CM

## 2020-04-01 LAB — ECHOCARDIOGRAM COMPLETE
Area-P 1/2: 1.9 cm2
S' Lateral: 3 cm

## 2020-04-01 NOTE — Telephone Encounter (Signed)
PA approved. Effective 03/01/2020 to 03/31/2021.

## 2020-04-09 ENCOUNTER — Other Ambulatory Visit: Payer: Self-pay | Admitting: Family Medicine

## 2020-04-15 ENCOUNTER — Other Ambulatory Visit: Payer: Self-pay | Admitting: Family Medicine

## 2020-04-15 NOTE — Telephone Encounter (Signed)
Requesting:zolpidem (AMBIEN) 10 MG tablet Contract:06/11/18 UDS:06/11/18 Last Visit:02/25/20 Next Visit:07/09/20 Last Refill:  Please Advise

## 2020-04-15 NOTE — Telephone Encounter (Signed)
Pt states he needs approval for this medication    Medication: zolpidem (AMBIEN) 10 MG tablet  Has the patient contacted their pharmacy? No. (If no, request that the patient contact the pharmacy for the refill.) (If yes, when and what did the pharmacy advise?)  Preferred Pharmacy (with phone number or street name): [facility], [place] - [address]  [place], [place] [address]  Phone:  [phone] Fax:  [phone]  DEA #:  --  Agent: Please be advised that RX refills may take up to 3 business days. We ask that you follow-up with your pharmacy.

## 2020-04-22 ENCOUNTER — Telehealth: Payer: Self-pay | Admitting: Pharmacist

## 2020-04-22 DIAGNOSIS — K52832 Lymphocytic colitis: Secondary | ICD-10-CM | POA: Diagnosis not present

## 2020-04-22 DIAGNOSIS — R197 Diarrhea, unspecified: Secondary | ICD-10-CM | POA: Diagnosis not present

## 2020-04-22 NOTE — Progress Notes (Addendum)
Chronic Care Management Pharmacy Assistant   Name: James [name]  MRN: [id number] DOB: Dec 03, 1940  Reason for Encounter: Medication Review  Patient Questions:  1.  Have you seen any other providers since your last visit? No  2.  Any changes in your medicines or health? No    PCP : [name], MD  Allergies:   Allergies  Allergen Reactions   Sulfa Antibiotics Other (See Comments)    Childhood allergy    Medications: Outpatient Encounter Medications as of 04/22/2020  Medication Sig   zolpidem (AMBIEN) 10 MG tablet TAKE 1 TABLET BY MOUTH EVERY NIGHT AT BEDTIME AS NEEDED FOR SLEEP   ALPRAZolam (XANAX) 0.25 MG tablet TAKE 1 TABLET BY MOUTH TWICE DAILY AS NEEDED FOR ANXIETY   aspirin EC 81 MG tablet Take 81 mg by mouth. About once a five times a week   atorvastatin (LIPITOR) 20 MG tablet TAKE 1 TABLET BY MOUTH EVERY DAY AT 6PM FOR CHOLESTEROL   Calcium Carb-Cholecalciferol (CALCIUM 1000 + D PO) Take 1 tablet by mouth 3 (three) times a week.    Cyanocobalamin (VITAMIN B 12) 500 MCG TABS Take 1 tablet by mouth daily.   diphenhydrAMINE (BENADRYL) 25 MG tablet Take 25 mg by mouth at bedtime as needed.   Dutasteride-Tamsulosin HCl 0.5-0.4 MG CAPS Take 1 capsule by mouth at bedtime.   ELDERBERRY PO Take by mouth.   levothyroxine (SYNTHROID) 100 MCG tablet Take 1 tablet (100 mcg total) by mouth daily before breakfast.   LORazepam (ATIVAN) 0.5 MG tablet TAKE 1 TABLET BY MOUTH 2 TIMES A DAY AS NEEDED FOR ANXIETY   meclizine (ANTIVERT) 12.5 MG tablet Take 1 tablet (12.5 mg total) by mouth 3 (three) times daily as needed for dizziness.   Melatonin 10 MG TABS Take 1 tablet by mouth at bedtime as needed.   Milk Thistle 1000 MG CAPS Take 1 capsule by mouth daily.   Multiple Vitamins-Minerals (MULTIVITAMINS THER. W/MINERALS) TABS Take 1 tablet by mouth daily.   pantoprazole (PROTONIX) 40 MG tablet TAKE 1 TABLET BY MOUTH 2 TIMES A DAY BEFORE MEALS FOR ACID REFLUX    Probiotic Product (PROBIOTIC DAILY PO) Take by mouth.   sildenafil (REVATIO) 20 MG tablet TAKE 1 TO 5 TABLETS BY MOUTH EVERY DAY AS NEEDED   Testosterone 30 MG/ACT SOLN APPLY 1 PUMP UNDER EACH ARM EVERY MORNING AS DIRECTED   venlafaxine XR (EFFEXOR XR) 37.5 MG 24 hr capsule Take 1 capsule (37.5 mg total) by mouth daily with breakfast.   No facility-administered encounter medications on file as of 04/22/2020.    Current Diagnosis: Patient Active Problem List   Diagnosis Date Noted   Skin lesion of left leg 09/17/2019   Bilateral thoracic back pain 11/27/2016   Essential hypertension 11/27/2016   Abdominal bloating 05/19/2016   BPH (benign prostatic hyperplasia) 08/14/2015   Neck pain 05/03/2015   BCC (basal cell carcinoma of skin) 06/09/2014   Anxiety state 05/18/2014   Low testosterone 03/16/2014   Insomnia 03/16/2014   Preventative health care 03/16/2014   Diverticulosis of colon without hemorrhage 03/16/2014   Knee pain 10/19/2013   AAA (abdominal aortic aneurysm) without rupture ([place]) 09/08/2013   Erectile dysfunction 09/08/2013   Benign paroxysmal positional vertigo 04/06/2013   Chest pain 01/03/2012   Hyperlipidemia 01/03/2012   CAD (coronary artery disease) 01/03/2012   Hypothyroidism 01/03/2012   GERD (gastroesophageal reflux disease) 01/03/2012   History of atrial fibrillation    Diverticulitis    Mitral valve  prolapse     Goals Addressed   None    Have you seen any other providers since your last visit? Yes Any changes in your medications or health? Yes  Reports having diarrhea for several weeks and was prescribed budesonide 3 mg three tabs daily.   Reports medication is helping Any side effects from any medications? No Do you have an symptoms or problems not managed by your medications? No Any concerns about your health right now? Stated he does not know if he needs Venlafaxine XR 37.5 mg Has your provider asked that you check blood  pressure, blood sugar, or follow special diet at home? Stated he does not eat fried food, blood pressure is good and does not have sugar problems Do you get any type of exercise on a regular basis? States he does run 55 minutes 4.1 miles three times a week Can you think of a goal you would like to reach for your health? States he would like to run more and discuss medication Venlafaxine XR 37.5 mg Do you have any problems getting your medications? No Is there anything that you would like to discuss during the appointment? Reports he does not think he needs Venlafaxine XR  Please bring medications and supplements to appointment   Patient states he would like a telephone visit.  Follow-Up:  Pharmacist Review   [place] Shannon, [facility] Pharmacist Assistant [phone]   Reviewed by: De Blanch, PharmD Clinical Pharmacist [facility] [phone]

## 2020-04-23 ENCOUNTER — Other Ambulatory Visit: Payer: Self-pay

## 2020-04-23 ENCOUNTER — Ambulatory Visit: Payer: Medicare Other | Admitting: Pharmacist

## 2020-04-23 DIAGNOSIS — I10 Essential (primary) hypertension: Secondary | ICD-10-CM

## 2020-04-23 DIAGNOSIS — E785 Hyperlipidemia, unspecified: Secondary | ICD-10-CM

## 2020-04-23 DIAGNOSIS — F32A Depression, unspecified: Secondary | ICD-10-CM

## 2020-04-23 NOTE — Chronic Care Management (AMB) (Signed)
Chronic Care Management Pharmacy  Name: [name]  MRN: [id number] DOB: [birth year]  Chief Complaint/ HPI  James [name],  79 y.o. , male presents for their Initial CCM visit with the clinical pharmacist via telephone (was scheduled for in office, but pt did not show in office).  PCP : [name], MD  Their chronic conditions include: Hypertension, Hyperlipidemia/CAD, Depression/Anxiety, Hypothyroidism, BPH, Diverticulitis, Insomnia  Office Visits: 03/10/20: Visit w/ Mackie Pai, PA-C - Atypical chest pain. Dehydration post running 5K. Labs and chest x-ray ordered. Troponin WNL, recommend pt to follow up with cardio. Chest x-ray showed lungs clear and cardiac siloutte WNL.  Consult Visit: 03/17/20: Cardio visit w/ Dr. Johney Frame - continue aspirin and statin. No BB due to bradycardia at baseline. Discussed hydration with electrolytes.  Medications: Outpatient Encounter Medications as of 04/23/2020  Medication Sig Note  . ALPRAZolam (XANAX) 0.25 MG tablet TAKE 1 TABLET BY MOUTH TWICE DAILY AS NEEDED FOR ANXIETY   . aspirin EC 81 MG tablet Take 81 mg by mouth. About once a five times a week   . atorvastatin (LIPITOR) 20 MG tablet TAKE 1 TABLET BY MOUTH EVERY Youssouf Shipley AT 6PM FOR CHOLESTEROL   . Calcium Carb-Cholecalciferol (CALCIUM 1000 + D PO) Take 1 tablet by mouth 3 (three) times a week.    . Cyanocobalamin (VITAMIN B 12) 500 MCG TABS Take 1 tablet by mouth daily.   . Dutasteride-Tamsulosin HCl 0.5-0.4 MG CAPS Take 1 capsule by mouth at bedtime.   Marland Kitchen ELDERBERRY PO Take 50 mg by mouth.    . levothyroxine (SYNTHROID) 100 MCG tablet Take 1 tablet (100 mcg total) by mouth daily before breakfast.   . LORazepam (ATIVAN) 0.5 MG tablet TAKE 1 TABLET BY MOUTH 2 TIMES A Ka Flammer AS NEEDED FOR ANXIETY   . meclizine (ANTIVERT) 12.5 MG tablet Take 1 tablet (12.5 mg total) by mouth 3 (three) times daily as needed for dizziness. 04/23/2020: Last used 3 weeks ago  . Melatonin 10 MG TABS Take 1 tablet by  mouth at bedtime as needed.   . Milk Thistle 1000 MG CAPS Take 1 capsule by mouth daily.   . mirtazapine (REMERON) 15 MG tablet Take 15 mg by mouth at bedtime.   . Multiple Vitamins-Minerals (MULTIVITAMINS THER. W/MINERALS) TABS Take 1 tablet by mouth daily.   . Probiotic Product (PROBIOTIC DAILY PO) Take by mouth.   . sildenafil (REVATIO) 20 MG tablet TAKE 1 TO 5 TABLETS BY MOUTH EVERY Dewanda Fennema AS NEEDED   . Testosterone 30 MG/ACT SOLN APPLY 1 PUMP UNDER EACH ARM EVERY MORNING AS DIRECTED   . venlafaxine XR (EFFEXOR XR) 37.5 MG 24 hr capsule Take 1 capsule (37.5 mg total) by mouth daily with breakfast. 04/23/2020: Would like to taper off  . zolpidem (AMBIEN) 10 MG tablet TAKE 1 TABLET BY MOUTH EVERY NIGHT AT BEDTIME AS NEEDED FOR SLEEP   . [DISCONTINUED] pantoprazole (PROTONIX) 40 MG tablet TAKE 1 TABLET BY MOUTH 2 TIMES A Rya Rausch BEFORE MEALS FOR ACID REFLUX 04/23/2020: Usually takes once daily  . diphenhydrAMINE (BENADRYL) 25 MG tablet Take 25 mg by mouth at bedtime as needed. (Patient not taking: Reported on 04/23/2020)    No facility-administered encounter medications on file as of 04/23/2020.   SDOH Screenings   Alcohol Screen:   . Last Alcohol Screening Score (AUDIT): Not on file  Depression (PHQ2-9): Low Risk   . PHQ-2 Score: 0  Financial Resource Strain:   . Difficulty of Paying Living Expenses: Not on  file  Food Insecurity:   . Worried About Charity fundraiser in the Last Year: Not on file  . Ran Out of Food in the Last Year: Not on file  Housing:   . Last Housing Risk Score: Not on file  Physical Activity:   . Days of Exercise per Week: Not on file  . Minutes of Exercise per Session: Not on file  Social Connections:   . Frequency of Communication with Friends and Family: Not on file  . Frequency of Social Gatherings with Friends and Family: Not on file  . Attends Religious Services: Not on file  . Active Member of Clubs or Organizations: Not on file  . Attends Archivist  Meetings: Not on file  . Marital Status: Not on file  Stress:   . Feeling of Stress : Not on file  Tobacco Use: Low Risk   . Smoking Tobacco Use: Never Smoker  . Smokeless Tobacco Use: Never Used  Transportation Needs:   . Film/video editor (Medical): Not on file  . Lack of Transportation (Non-Medical): Not on file     Current Diagnosis/Assessment:  Goals Addressed            This Visit's Progress   . Chronic Care Management Pharmacy Care Plan       CARE PLAN ENTRY (see longitudinal plan of care for additional care plan information)  Current Barriers:  . Chronic Disease Management support, education, and care coordination needs related to Hypertension, Hyperlipidemia/CAD, Depression/Anxiety, Hypothyroidism, BPH, Diverticulitis, Insomnia   Hypertension BP Readings from Last 3 Encounters:  03/17/20 (!) 112/58  03/10/20 129/77  03/07/20 130/87   . Pharmacist Clinical Goal(s): o Over the next 90 days, patient will work with PharmD and providers to maintain BP goal <130/80 . Current regimen:  o Diet and exercise management   . Interventions: o Discussed BP goal . Patient self care activities - Over the next 90 days, patient will: o Maintain BP goal less than 130/80  Hyperlipidemia Lab Results  Component Value Date/Time   LDLCALC 46 09/17/2019 11:23 AM   . Pharmacist Clinical Goal(s): o Over the next 90 days, patient will work with PharmD and providers to maintain LDL goal < 70 . Current regimen:  . Atorvastatin 67m daily HS . Aspirin 871mdaily AM . Patient self care activities - Over the next 90 days, patient will: o Maintain cholesterol medication regimen.   Depression/Anxiety . Pharmacist Clinical Goal(s) o Over the next 90 days, patient will work with PharmD and providers to reduce risk of symptoms associated with depression/anxiety and reduce pill burden . Current regimen:  . Alprazolam 0.2561mwice daily as needed  . Lorazepam 0.5mg31mice daily as  needed (uses about once per week) . Mirtazapine 10mg35mly . Venlafaxine 37.5mg d84my  . Interventions: o Discussed possible tapering schedule for venlafaxine pending Dr. Blyth Charlett Blakeval - Week 1: Take every other Kailany Dinunzio - Week 2: Take every 3rd Anndee Connett (2 days between doses) - Week 3: Take every 4th Zayneb Baucum/Twice a week (3 days between doses) - Week: 4: Take once a week - Week 5: Discontinue  o Discouraged use of alprazolam and lorazepam in combination. If medication is needed, use lorazepam . Patient self care activities - Over the next 90 days, patient will: o Consider venlafaxine taper pending Dr. Blyth Charlett Blakeval o Not use alprazolam and lorazepam in combination  Health Maintenance  . Pharmacist Clinical Goal(s) o Over the next 90 days, patient will work with PharmD  and providers to complete health maintenance screenings/vaccinations . Interventions: o Discussed Shingrix vaccine series o Recommended patient receive Td booster . Patient self care activities - Over the next 90 days, patient will: o Complete Shingrix vaccine series o Receive Td booster   Medication management . Pharmacist Clinical Goal(s): o Over the next 90 days, patient will work with PharmD and providers to maintain optimal medication adherence . Current pharmacy: [facility]  . Interventions o Comprehensive medication review performed. o Continue current medication management strategy . Patient self care activities - Over the next 90 days, patient will: o Focus on medication adherence by filling and taking medications appropriately  o Take medications as prescribed o Report any questions or concerns to PharmD and/or provider(s)  Initial goal documentation        Hypertension   BP goal is:  <130/80  Office blood pressures are  BP Readings from Last 3 Encounters:  03/17/20 (!) 112/58  03/10/20 129/77  03/07/20 130/87   Patient checks BP at home infrequently Patient home BP readings are ranging:  Unable to assess  Patient has failed these meds in the past: None noted  Patient is currently controlled on the following medications:  . None   Plan -Continue control with diet and exercise     Hyperlipidemia/CAD   LDL goal < 70  Last lipids Lab Results  Component Value Date   CHOL 111 09/17/2019   HDL 49.60 09/17/2019   LDLCALC 46 09/17/2019   TRIG 78.0 09/17/2019   CHOLHDL 2 09/17/2019   Hepatic Function Latest Ref Rng & Units 03/10/2020 03/07/2020 01/20/2020  Total Protein 6.0 - 8.3 g/dL 6.8 7.5 6.6  Albumin 3.5 - 5.2 g/dL 3.9 4.3 3.9  AST 0 - 37 U/L 23 31 18   ALT 0 - 53 U/L 19 23 14   Alk Phosphatase 39 - 117 U/L 52 51 50  Total Bilirubin 0.2 - 1.2 mg/dL 0.5 1.2 0.7  Bilirubin, Direct 0.0 - 0.2 mg/dL - 0.2 -     The ASCVD Risk score (Marlette., et al., 2013) failed to calculate for the following reasons:   The 2013 ASCVD risk score is only valid for ages 11 to 36   Patient has failed these meds in past: pravastatin (inefficacy) Patient is currently controlled on the following medications:  . Atorvastatin 68m daily HS . Aspirin 863mdaily AM (pt taking every other Mersadie Kavanaugh)   Plan -Continue current medications  Depression/Anxiety   Depression screen [facility]/9 04/23/2020 01/20/2020 09/17/2019  Decreased Interest 0 3 0  Down, Depressed, Hopeless 0 3 0  PHQ - 2 Score 0 6 0  Altered sleeping 0 1 2  Tired, decreased energy 0 - 0  Change in appetite 0 3 0  Feeling bad or failure about yourself  0 3 0  Trouble concentrating 0 0 0  Moving slowly or fidgety/restless 0 0 0  Suicidal thoughts 0 0 0  PHQ-9 Score 0 13 2  Difficult doing work/chores - Somewhat difficult Not difficult at all    Patient has failed these meds in past: None noted  Patient is currently controlled on the following medications:  . Alprazolam 0.2571mwice daily as needed  . Lorazepam 0.5mg23mice daily as needed (uses about once per week) . Mirtazapine 10mg41mly . Venlafaxine 37.5mg d12my    Started venlafaxine when his soulmate passed away around July 208-21-2021s interested in tapering off of the venlafaxine. Agreeable to this noting he is also using mirtazapine.  Will consult with PCP. Has been using CBD for about 3 weeks at bedtime.  Feels it helps with sleep.  We discussed:  Possible tapering schedule for venlafaxine. Discussed that patient should not use alprazolam and lorazepam in combination. Lorazepam preferred benzo in elderly if benzo is needed.   Plan -Consider tapering schedule for venlafaxine pending Dr. Charlett Blake approval. -Venlafaxine Tapering Schedule  Week 1: Take every other Mckinnley Cottier Week 2: Take every 3rd Janiqua Friscia (2 days between doses) Week 3: Take every 4th Kaliel Bolds/Twice a week (3 days between doses) Week: 4: Take once a week Week 5: Discontinue -Do not use alprazolam and lorazepam in combination. Only use lorazepam if needed.  Hypothyroidism   Lab Results  Component Value Date/Time   TSH 0.60 09/17/2019 11:23 AM   TSH 1.52 07/16/2018 03:24 PM    Patient has failed these meds in past: None noted  Patient is currently controlled on the following medications:  . Levothyroxine 164m daily 6am  Plan -Continue current medications  BPH   PSA  Date Value Ref Range Status  01/09/2018 0.47 0.10 - 4.00 ng/mL Final    Comment:    Test performed using Access Hybritech PSA Assay, a parmagnetic partical, chemiluminecent immunoassay.  04/23/2015 0.51 0.10 - 4.00 ng/ml Final  03/11/2014 0.62 0.10 - 4.00 ng/ml Final     Patient has failed these meds in past: None noted  Patient is currently controlled on the following medications:  . Dutasteride-tamsulosin 0.5-0.4922mdaily HS  Overnight urination: 1-2 times [place]? Yes Emptying Bladder? Yes   Plan -Continue current medications  Diverticulitis     Patient has failed these meds in past: None noted  Patient is currently controlled on the following medications: . Budesonide 22m38m3 daily  Plans to have pt D/C  in one month per gastro  Plan -Continue current medications   Insomnia    Patient has failed these meds in past: None noted  Patient is currently controlled on the following medications: . ZMarland Kitchenlpidem 64m55mily  Feels his regimen is working   Plan -Continue current medications  Vaccines   Reviewed and discussed patient's vaccination history.    Immunization History  Administered Date(s) Administered  . Influenza Whole 03/20/2012  . Influenza, High Dose Seasonal PF 04/02/2013, 04/23/2015  . Influenza,inj,Quad PF,6+ Mos 03/11/2014, 04/08/2019  . Influenza-Unspecified 03/03/2016, 03/20/2018  . PFIZER SARS-COV-2 Vaccination 07/06/2019, 07/27/2019  . Pneumococcal Conjugate-13 03/11/2014  . Pneumococcal Polysaccharide-23 04/18/2009  . Tdap 08/05/2009  . Zoster 09/10/2008   [place] [facility] [place] [address] [place] 04[place]06[place]20  Plan  Recommended patient receive Shingrix vaccine in pharmacy.  Recommended patient receive Td vaccine  Medication Management   Pt uses [facility] for all medications Uses pill box? No - Has them lined up in the order he takes them Pt endorses rarely missing.   Miscellaneous Meds Vitamin B12 500mc19mily Elderberry Melatonin Milk Thistle every other  - for liver health Calcium/Vitamin D 500mg/722m units three times a week  Meds to D/C from list Diphenhydramine (no longer taking) Alprazolam (no longer taking)  Plan -Continue current medication management strategy    Follow up:  1 month check in  3 month phone visit  KaneshDe BlanchmD Clinical Pharmacist [phone]

## 2020-04-27 ENCOUNTER — Other Ambulatory Visit: Payer: Self-pay | Admitting: Family Medicine

## 2020-04-27 ENCOUNTER — Other Ambulatory Visit: Payer: Self-pay

## 2020-04-27 DIAGNOSIS — I10 Essential (primary) hypertension: Secondary | ICD-10-CM

## 2020-04-27 DIAGNOSIS — E785 Hyperlipidemia, unspecified: Secondary | ICD-10-CM

## 2020-05-05 DIAGNOSIS — D692 Other nonthrombocytopenic purpura: Secondary | ICD-10-CM | POA: Diagnosis not present

## 2020-05-05 DIAGNOSIS — D225 Melanocytic nevi of trunk: Secondary | ICD-10-CM | POA: Diagnosis not present

## 2020-05-05 DIAGNOSIS — L570 Actinic keratosis: Secondary | ICD-10-CM | POA: Diagnosis not present

## 2020-05-05 DIAGNOSIS — L821 Other seborrheic keratosis: Secondary | ICD-10-CM | POA: Diagnosis not present

## 2020-05-05 DIAGNOSIS — L820 Inflamed seborrheic keratosis: Secondary | ICD-10-CM | POA: Diagnosis not present

## 2020-05-10 NOTE — Patient Instructions (Addendum)
Visit Information  Goals Addressed            This Visit's Progress   . Chronic Care Management Pharmacy Care Plan       CARE PLAN ENTRY (see longitudinal plan of care for additional care plan information)  Current Barriers:  . Chronic Disease Management support, education, and care coordination needs related to Hypertension, Hyperlipidemia/CAD, Depression/Anxiety, Hypothyroidism, BPH, Diverticulitis, Insomnia   Hypertension BP Readings from Last 3 Encounters:  03/17/20 (!) 112/58  03/10/20 129/77  03/07/20 130/87   . Pharmacist Clinical Goal(s): o Over the next 90 days, patient will work with PharmD and providers to maintain BP goal <130/80 . Current regimen:  o Diet and exercise management   . Interventions: o Discussed BP goal . Patient self care activities - Over the next 90 days, patient will: o Maintain BP goal less than 130/80  Hyperlipidemia Lab Results  Component Value Date/Time   LDLCALC 46 09/17/2019 11:23 AM   . Pharmacist Clinical Goal(s): o Over the next 90 days, patient will work with PharmD and providers to maintain LDL goal < 70 . Current regimen:  . Atorvastatin 40m daily HS . Aspirin 872mdaily AM . Patient self care activities - Over the next 90 days, patient will: o Maintain cholesterol medication regimen.   Depression/Anxiety . Pharmacist Clinical Goal(s) o Over the next 90 days, patient will work with PharmD and providers to reduce risk of symptoms associated with depression/anxiety and reduce pill burden . Current regimen:  . Alprazolam 0.252mwice daily as needed  . Lorazepam 0.5mg58mice daily as needed (uses about once per week) . Mirtazapine 10mg32mly . Venlafaxine 37.5mg d32my  . Interventions: o Discussed possible tapering schedule for venlafaxine pending Dr. Blyth Charlett Blakeval - Week 1: Take every other Adyn Hoes - Week 2: Take every 3rd Britanee Vanblarcom (2 days between doses) - Week 3: Take every 4th Kimoni Pickerill/Twice a week (3 days between doses) - Week: 4:  Take once a week - Week 5: Discontinue  o Discouraged use of alprazolam and lorazepam in combination. If medication is needed, use lorazepam . Patient self care activities - Over the next 90 days, patient will: o Consider venlafaxine taper pending Dr. Blyth Charlett Blakeval o Not use alprazolam and lorazepam in combination  Health Maintenance  . Pharmacist Clinical Goal(s) o Over the next 90 days, patient will work with PharmD and providers to complete health maintenance screenings/vaccinations . Interventions: o Discussed Shingrix vaccine series o Recommended patient receive Td booster . Patient self care activities - Over the next 90 days, patient will: o Complete Shingrix vaccine series o Receive Td booster   Medication management . Pharmacist Clinical Goal(s): o Over the next 90 days, patient will work with PharmD and providers to maintain optimal medication adherence . Current pharmacy: [facility]  . Interventions o Comprehensive medication review performed. o Continue current medication management strategy . Patient self care activities - Over the next 90 days, patient will: o Focus on medication adherence by filling and taking medications appropriately  o Take medications as prescribed o Report any questions or concerns to PharmD and/or provider(s)  Initial goal documentation        Mr. Linhart was given information about Chronic Care Management services today including:  1. CCM service includes personalized support from designated clinical staff supervised by his physician, including individualized plan of care and coordination with other care providers 2. 24/7 contact phone numbers for assistance for urgent and routine care needs. 3. Standard insurance, coinsurance,  copays and deductibles apply for chronic care management only during months in which we provide at least 20 minutes of these services. Most insurances cover these services at 100%, however patients may be  responsible for any copay, coinsurance and/or deductible if applicable. This service may help you avoid the need for more expensive face-to-face services. 4. Only one practitioner may furnish and bill the service in a calendar month. 5. The patient may stop CCM services at any time (effective at the end of the month) by phone call to the office staff.  Patient agreed to services and verbal consent obtained.   The patient verbalized understanding of instructions, educational materials, and care plan provided today and agreed to receive a mailed copy of patient instructions, educational materials, and care plan.  Telephone follow up appointment with pharmacy team member scheduled for: 07/23/2020  Melvenia Beam Laveyah Oriol, PharmD Clinical Pharmacist [facility] [phone]   Healthy Eating Following a healthy eating pattern may help you to achieve and maintain a healthy body weight, reduce the risk of chronic disease, and live a long and productive life. It is important to follow a healthy eating pattern at an appropriate calorie level for your body. Your nutritional needs should be met primarily through food by choosing a variety of nutrient-rich foods. What are tips for following this plan? Reading food labels  Read labels and choose the following: ? Reduced or low sodium. ? Juices with 100% fruit juice. ? Foods with low saturated fats and high polyunsaturated and monounsaturated fats. ? Foods with whole grains, such as whole wheat, cracked wheat, brown rice, and wild rice. ? Whole grains that are fortified with folic acid. This is recommended for women who are pregnant or who want to become pregnant.  Read labels and avoid the following: ? Foods with a lot of added sugars. These include foods that contain brown sugar, corn sweetener, corn syrup, dextrose, fructose, glucose, high-fructose corn syrup, honey, invert sugar, lactose, malt syrup, maltose, molasses, raw sugar,  sucrose, trehalose, or turbinado sugar.  Do not eat more than the following amounts of added sugar per Cris Talavera:  6 teaspoons (25 g) for women.  9 teaspoons (38 g) for men. ? Foods that contain processed or refined starches and grains. ? Refined grain products, such as white flour, degermed cornmeal, white bread, and white rice. Shopping  Choose nutrient-rich snacks, such as vegetables, whole fruits, and nuts. Avoid high-calorie and high-sugar snacks, such as potato chips, fruit snacks, and candy.  Use oil-based dressings and spreads on foods instead of solid fats such as butter, stick margarine, or cream cheese.  Limit pre-made sauces, mixes, and "instant" products such as flavored rice, instant noodles, and ready-made pasta.  Try more plant-protein sources, such as tofu, tempeh, black beans, edamame, lentils, nuts, and seeds.  Explore eating plans such as the Mediterranean diet or vegetarian diet. Cooking  Use oil to saut or stir-fry foods instead of solid fats such as butter, stick margarine, or lard.  Try baking, boiling, grilling, or broiling instead of frying.  Remove the fatty part of meats before cooking.  Steam vegetables in water or broth. Meal planning   At meals, imagine dividing your plate into fourths: ? One-half of your plate is fruits and vegetables. ? One-fourth of your plate is whole grains. ? One-fourth of your plate is protein, especially lean meats, poultry, eggs, tofu, beans, or nuts.  Include low-fat dairy as part of your daily diet. Lifestyle  Choose healthy  options in all settings, including home, work, school, restaurants, or stores.  Prepare your food safely: ? Wash your hands after handling raw meats. ? Keep food preparation surfaces clean by regularly washing with hot, soapy water. ? Keep raw meats separate from ready-to-eat foods, such as fruits and vegetables. ? Cook seafood, meat, poultry, and eggs to the recommended internal  temperature. ? Store foods at safe temperatures. In general:  Keep cold foods at 29F (4.4C) or below.  Keep hot foods at 129F (60C) or above.  Keep your freezer at [facility] (-17.8C) or below.  Foods are no longer safe to eat when they have been between the temperatures of 40-129F (4.4-60C) for more than 2 hours. What foods should I eat? Fruits Aim to eat 2 cup-equivalents of fresh, canned (in natural juice), or frozen fruits each Bain Whichard. Examples of 1 cup-equivalent of fruit include 1 small apple, 8 large strawberries, 1 cup canned fruit,  cup dried fruit, or 1 cup 100% juice. Vegetables Aim to eat 2-3 cup-equivalents of fresh and frozen vegetables each Ciel Yanes, including different varieties and colors. Examples of 1 cup-equivalent of vegetables include 2 medium carrots, 2 cups raw, leafy greens, 1 cup chopped vegetable (raw or cooked), or 1 medium baked potato. Grains Aim to eat 6 ounce-equivalents of whole grains each Oral Hallgren. Examples of 1 ounce-equivalent of grains include 1 slice of bread, 1 cup ready-to-eat cereal, 3 cups popcorn, or  cup cooked rice, pasta, or cereal. Meats and other proteins Aim to eat 5-6 ounce-equivalents of protein each Yvanna Vidas. Examples of 1 ounce-equivalent of protein include 1 egg, 1/2 cup nuts or seeds, or 1 tablespoon (16 g) peanut butter. A cut of meat or fish that is the size of a deck of cards is about 3-4 ounce-equivalents.  Of the protein you eat each week, try to have at least 8 ounces come from seafood. This includes salmon, trout, herring, and anchovies. Dairy Aim to eat 3 cup-equivalents of fat-free or low-fat dairy each Valeda Corzine. Examples of 1 cup-equivalent of dairy include 1 cup (240 mL) milk, 8 ounces (250 g) yogurt, 1 ounces (44 g) natural cheese, or 1 cup (240 mL) fortified soy milk. Fats and oils  Aim for about 5 teaspoons (21 g) per Kassius Battiste. Choose monounsaturated fats, such as canola and olive oils, avocados, peanut butter, and most nuts, or polyunsaturated  fats, such as sunflower, corn, and soybean oils, walnuts, pine nuts, sesame seeds, sunflower seeds, and flaxseed. Beverages  Aim for six 8-oz glasses of water per Hayle Parisi. Limit coffee to three to five 8-oz cups per Natisha Trzcinski.  Limit caffeinated beverages that have added calories, such as soda and energy drinks.  Limit alcohol intake to no more than 1 drink a Marigene Erler for nonpregnant women and 2 drinks a Velda Wendt for men. One drink equals 12 oz of beer (355 mL), 5 oz of wine (148 mL), or 1 oz of hard liquor (44 mL). Seasoning and other foods  Avoid adding excess amounts of salt to your foods. Try flavoring foods with herbs and spices instead of salt.  Avoid adding sugar to foods.  Try using oil-based dressings, sauces, and spreads instead of solid fats. This information is based on general [place] nutrition guidelines. For more information, visit Build[employer].es. Exact amounts may vary based on your nutrition needs. Summary  A healthy eating plan may help you to maintain a healthy weight, reduce the risk of chronic diseases, and stay active throughout your life.  Plan your meals. Make sure you eat  the right portions of a variety of nutrient-rich foods.  Try baking, boiling, grilling, or broiling instead of frying.  Choose healthy options in all settings, including home, work, school, restaurants, or stores. This information is not intended to replace advice given to you by your health care provider. Make sure you discuss any questions you have with your health care provider. Document Revised: 09/18/2017 Document Reviewed: 09/18/2017 Elsevier Patient Education  [place].

## 2020-05-11 ENCOUNTER — Other Ambulatory Visit: Payer: Self-pay | Admitting: Family Medicine

## 2020-05-11 DIAGNOSIS — I10 Essential (primary) hypertension: Secondary | ICD-10-CM

## 2020-05-11 NOTE — Telephone Encounter (Signed)
Last written: 03/20/20 Last ov:  03/10/20 Next ov: 07/09/20 Contract: will get at next visit UDS: will get at next visit

## 2020-06-01 ENCOUNTER — Other Ambulatory Visit: Payer: Self-pay | Admitting: Family Medicine

## 2020-06-03 NOTE — Progress Notes (Signed)
Cardiology Office Note:    Date:  06/05/2020   ID:  James [name], DOB [birth year], MRN [id number]  PCP:  [name], MD  [place] Cardiologist:  No primary care provider on file.  [facility] Electrophysiologist:  None   Referring MD: [name], MD    History of Present Illness:    James [name] is a 79 y.o. male with a hx of AAA, nonobstructive CAD, and HLD who presents to clinic for follow-up.   Patient was in ED on 09/18/2 after running 5K where he developed significant lightheadedness.No LOC. Just felt weak and exhausted. No chest pain/pressure or SOB. Had diarrhea for several weeks prior leading up to the run having on average 6 episodes per day. ECG with NSR without ischemic changes.Trop 29-->32. Thought to be due to dehydration.  He presented to Cardiology clinic on 03/17/20 for pre-operative clearance for colonoscopy. Myoview was negative for ischemia. EF normal. TTE with normal BiV function, no significant valvular abnormalities. Colonoscopy with lymphocytic colitis.  Polyp was benign. He was given medication to take for 3 months and his symptoms have since resolved.   Otherwise, the patient feels overall wll. Back to running 19miles 3x/week. No chest pain, SOB, lightheadedness, fatigue. Planning to participate in [employer] in [place] in May. Has been having some sciatic pain and is hoping to get a referral to PT.  Past Medical History:  Diagnosis Date  . AAA (abdominal aortic aneurysm) without rupture ([place]) 09/08/2013  . Abdominal bloating 05/19/2016  . Anxiety state 05/18/2014  . Atrial fibrillation ([place])   . 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 Benign paroxysmal positional vertigo 04/06/2013  . CAD (coronary artery disease)    nonobstructive by cath 2008; nonobstructive by Cardiac CT 11/2011  . Cervical spine fracture ([facility]) 1980   C6-7  . Diverticulosis   . Erectile dysfunction 09/08/2013  . GERD (gastroesophageal reflux  disease)   . Hiatal hernia   . History of atrial fibrillation   . History of squamous cell carcinoma 10-05-10   Dr [name], [place]  . HLD (hyperlipidemia)   . Hypothyroidism   . Medicare annual wellness visit, subsequent 03/16/2014   Sees [facility] Follows with Dr Johnsie Cancel of cardiology Sees [facility] for colonoscopy, last colonoscopy in 2013   . Mitral valve prolapse   . Neck pain 05/03/2015  . Preventative health care 03/16/2014  . 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 Syncope 02/03/2013  . 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    Past Surgical History:  Procedure Laterality Date  . APPENDECTOMY  05/2011  . CARDIAC CATHETERIZATION  01/03/12   30% ostial LAD stenosis, ostial 30-40% D1 stenosis, smooth and normal left main and RCA; LVEF 65%  . CHOLECYSTECTOMY N/A 08/25/2014   Procedure: LAPAROSCOPIC CHOLECYSTECTOMY WITH INTRAOPERATIVE CHOLANGIOGRAM;  Surgeon: [name], MD;  Location: [facility];  Service: General;  Laterality: N/A;  . INGUINAL HERNIA REPAIR  07/2010   right  . KNEE ARTHROPLASTY     bilat  . LEFT HEART CATHETERIZATION WITH CORONARY ANGIOGRAM N/A 01/03/2012   Procedure: LEFT HEART CATHETERIZATION WITH CORONARY ANGIOGRAM;  Surgeon: [name], MD;  [facility] C[facility] LAB;  Service: Cardiovascular;  Laterality: N/A;  . MRI  10/31/2016  . TONSILLECTOMY  1947  . tumor removal  1958   "fatty tumor"    Current Medications: Current Meds  Medication Sig  . ALPRAZolam (XANAX) 0.25 MG tablet TAKE 1  TABLET BY MOUTH TWICE DAILY AS NEEDED FOR ANXIETY  . aspirin EC 81 MG tablet Take 81 mg by mouth. About once a five times a week  . atorvastatin (LIPITOR) 20 MG tablet Take 1 tablet (20 mg total) by mouth daily.  . Calcium Carb-Cholecalciferol (CALCIUM 1000 + D PO) Take 1 tablet by mouth 3 (three) times a week.   . Cyanocobalamin (VITAMIN B 12) 500 MCG TABS Take 1 tablet by mouth daily.  . Dutasteride-Tamsulosin HCl 0.5-0.4 MG CAPS TAKE 1  CAPSULE BY MOUTH EVERY NIGHT AT BEDTIME  . ELDERBERRY PO Take 50 mg by mouth.   . levothyroxine (SYNTHROID) 100 MCG tablet TAKE 1 TABLET BY MOUTH EVERY MORNING BEFORE BREAKFAST  . LORazepam (ATIVAN) 0.5 MG tablet TAKE 1 TABLET BY MOUTH 2 TIMES A DAY AS NEEDED FOR ANXIETY  . meclizine (ANTIVERT) 12.5 MG tablet Take 1 tablet (12.5 mg total) by mouth 3 (three) times daily as needed for dizziness.  . Melatonin 10 MG TABS Take 1 tablet by mouth at bedtime as needed.  . Milk Thistle 1000 MG CAPS Take 1 capsule by mouth daily.  . mirtazapine (REMERON) 15 MG tablet Take 15 mg by mouth at bedtime.  . Multiple Vitamins-Minerals (MULTIVITAMINS THER. W/MINERALS) TABS Take 1 tablet by mouth daily.  . pantoprazole (PROTONIX) 40 MG tablet TAKE 1 TABLET BY MOUTH 2 TIMES A DAY BEFORE MEALS FOR ACID REFLUX  . Probiotic Product (PROBIOTIC DAILY PO) Take by mouth.  . sildenafil (REVATIO) 20 MG tablet TAKE 1 TO 5 TABLETS BY MOUTH EVERY DAY AS NEEDED  . Testosterone 30 MG/ACT SOLN APPLY 1 PUMP UNDER EACH ARM EVERY MORNING AS DIRECTED  . zolpidem (AMBIEN) 10 MG tablet TAKE 1 TABLET BY MOUTH EVERY NIGHT AT BEDTIME AS NEEDED FOR SLEEP     Allergies:   Sulfa antibiotics   Social History   Socioeconomic History  . Marital status: Single    Spouse name: Not on file  . Number of children: Not on file  . Years of education: Not on file  . Highest education level: Not on file  Occupational History  . Occupation: Chief Financial Officer    Comment: Retired  Tobacco Use  . Smoking status: Never Smoker  . Smokeless tobacco: Never Used  Substance and Sexual Activity  . Alcohol use: Yes    Comment: 2 glasses red wine/day  . Drug use: No  . Sexual activity: Yes  Other Topics Concern  . Not on file  Social History Narrative  . Not on file   Social Determinants of Health   Financial Resource Strain: Not on file  Food Insecurity: Not on file  Transportation Needs: Not on file  Physical Activity: Not on file  Stress: Not on  file  Social Connections: Not on file     Family History: The patient's family history includes Cancer in his paternal uncle and sister; Heart disease in his maternal grandfather, maternal grandmother, and mother; Hypertension in his mother; Lung disease in his father; Stroke in his mother.  ROS:   Please see the history of present illness.    Review of Systems  Constitutional: Negative for chills, fever and weight loss.  HENT: Negative for nosebleeds.   Eyes: Negative for blurred vision.  Respiratory: Negative for shortness of breath.   Cardiovascular: Negative for chest pain, palpitations, orthopnea, claudication, leg swelling and PND.  Gastrointestinal: Negative for heartburn, nausea and vomiting.  Genitourinary: Negative for frequency.  Musculoskeletal: Positive for myalgias.  Neurological: Negative for dizziness  and loss of consciousness.  Endo/Heme/Allergies: Negative for polydipsia.  Psychiatric/Behavioral: Negative for substance abuse.    EKGs/Labs/Other Studies Reviewed:    The following studies were reviewed today: Myoview 03/19/20:  The left ventricular ejection fraction is normal (55-65%).  Nuclear stress EF: 63%.  There was no ST segment deviation noted during stress.  The study is normal.  This is a low risk study.   Normal pharmacologic nuclear stress test with no evidence for prior infarct or ischemia. Normal LVEF.  TTE 04/01/20: IMPRESSIONS  1. Left ventricular ejection fraction, by estimation, is 60 to 65%. The  left ventricle has normal function. The left ventricle has no regional  wall motion abnormalities. Left ventricular diastolic parameters are  indeterminate.  2. Right ventricular systolic function is normal. The right ventricular  size is normal. There is normal pulmonary artery systolic pressure.  3. Left atrial size was mildly dilated.  4. Right atrial size was mildly dilated.  5. The mitral valve is grossly normal. Trivial mitral  valve  regurgitation.  6. The aortic valve is normal in structure. Aortic valve regurgitation is  mild. No aortic stenosis is present.  7. Aortic dilatation noted. There is mild to moderate dilatation of the  ascending aorta, measuring 41 mm.  Abdominal Ultrasound 04/01/20: Summary:  Abdominal Aorta: No evidence of an abdominal aortic aneurysm was  visualized. The largest aortic measurement is 2.6 cm. The largest aortic  diameter has decreased compared to prior exam. Previous diameter  measurement was 3.0 cm obtained on 2016.   IVC/Iliac: There is no evidence of thrombus involving the IVC.  Carotid ultrasound 01/2013 1-39% bilteral stenosis  Recent Labs: 09/17/2019: TSH 0.60 03/07/2020: Hemoglobin 14.7; Platelets 199 03/10/2020: ALT 19; BUN 12; Creatinine, Ser 0.97; Potassium 3.9; Sodium 134  Recent Lipid Panel    Component Value Date/Time   CHOL 111 09/17/2019 1123   TRIG 78.0 09/17/2019 1123   HDL 49.60 09/17/2019 1123   CHOLHDL 2 09/17/2019 1123   VLDL 15.6 09/17/2019 1123   LDLCALC 46 09/17/2019 1123      Physical Exam:    VS:  BP 140/80   Pulse (!) 51   Ht 5\' 10"  (1.778 m)   Wt 155 lb 3.2 oz (70.4 kg)   SpO2 98%   BMI 22.27 kg/m     Wt Readings from Last 3 Encounters:  06/05/20 155 lb 3.2 oz (70.4 kg)  03/19/20 149 lb (67.6 kg)  03/17/20 149 lb (67.6 kg)     GEN:  Well nourished, well developed in no acute distress HEENT: Normal NECK: No JVD; No carotid bruits CARDIAC: Bradycardic, regular, no murmurs, rubs, gallops RESPIRATORY:  Clear to auscultation without rales, wheezing or rhonchi  ABDOMEN: Soft, non-tender, non-distended MUSCULOSKELETAL:  No edema; No deformity  SKIN: Warm and dry NEUROLOGIC:  Alert and oriented x 3 PSYCHIATRIC:  Normal affect   ASSESSMENT:    1. Coronary artery disease involving native coronary artery of native heart without angina pectoris   2. Sciatica of left side   3. Acquired dilation of ascending aorta and aortic root  ([place])   4. Mild mitral regurgitation   5. Carotid artery disease, unspecified laterality, unspecified type ([place])    PLAN:    In order of problems listed above:  #Non-obstructive CAD: #Mild trop elevation Cath 2013 with LAD ostial 30-40% otherwise no significant disease. Very active and runs several miles daily as well as trains for marathons.  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  -Continue medical management with ASA and statin  #Mild MR: Stable on repeat echo. -Continue to monitor  #Left leg sciatica: -Will refer to PT    Medication Adjustments/Labs and Tests Ordered: Current medicines are reviewed at length with the patient today.  Concerns regarding medicines are outlined above.  Orders Placed This Encounter  Procedures  . Ambulatory referral to Physical Therapy   No orders of the defined types were placed in this encounter.   Patient Instructions  Medication Instructions:  Your physician recommends that you continue on your current medications as directed. Please refer to the Current Medication list given to you today.  Labwork: None ordered.  Testing/Procedures: None ordered.  Follow-Up: Your physician recommends that you schedule a follow-up appointment in:   12 months with Dr. Johney Frame  Any Other Special Instructions Will Be Listed Below (If Applicable).   Physical Therapy referral was made for our [facility] location.    If you need a refill on your cardiac medications  before your next appointment, please call your pharmacy.      Signed, Freada Bergeron, MD  06/05/2020 2:00 PM    [place]

## 2020-06-05 ENCOUNTER — Encounter: Payer: Self-pay | Admitting: Cardiology

## 2020-06-05 ENCOUNTER — Ambulatory Visit (INDEPENDENT_AMBULATORY_CARE_PROVIDER_SITE_OTHER): Payer: Medicare Other | Admitting: Cardiology

## 2020-06-05 ENCOUNTER — Other Ambulatory Visit: Payer: Self-pay

## 2020-06-05 VITALS — BP 140/80 | HR 51 | Ht 70.0 in | Wt 155.2 lb

## 2020-06-05 DIAGNOSIS — I2510 Atherosclerotic heart disease of native coronary artery without angina pectoris: Secondary | ICD-10-CM

## 2020-06-05 DIAGNOSIS — I340 Nonrheumatic mitral (valve) insufficiency: Secondary | ICD-10-CM | POA: Diagnosis not present

## 2020-06-05 DIAGNOSIS — I77810 Thoracic aortic ectasia: Secondary | ICD-10-CM

## 2020-06-05 DIAGNOSIS — I77819 Aortic ectasia, unspecified site: Secondary | ICD-10-CM | POA: Diagnosis not present

## 2020-06-05 DIAGNOSIS — I779 Disorder of arteries and arterioles, unspecified: Secondary | ICD-10-CM | POA: Diagnosis not present

## 2020-06-05 DIAGNOSIS — M5432 Sciatica, left side: Secondary | ICD-10-CM | POA: Diagnosis not present

## 2020-06-05 NOTE — Patient Instructions (Signed)
Medication Instructions:  Your physician recommends that you continue on your current medications as directed. Please refer to the Current Medication list given to you today.  Labwork: None ordered.  Testing/Procedures: None ordered.  Follow-Up: Your physician recommends that you schedule a follow-up appointment in:   12 months with Dr. Johney Frame  Any Other Special Instructions Will Be Listed Below (If Applicable).   Physical Therapy referral was made for our [facility] location.    If you need a refill on your cardiac medications before your next appointment, please call your pharmacy.

## 2020-06-24 DIAGNOSIS — K52832 Lymphocytic colitis: Secondary | ICD-10-CM | POA: Diagnosis not present

## 2020-06-29 ENCOUNTER — Ambulatory Visit: Payer: Medicare Other | Attending: Cardiology | Admitting: Physical Therapy

## 2020-06-29 ENCOUNTER — Encounter: Payer: Self-pay | Admitting: Physical Therapy

## 2020-06-29 ENCOUNTER — Ambulatory Visit: Payer: Self-pay | Admitting: *Deleted

## 2020-06-29 ENCOUNTER — Other Ambulatory Visit: Payer: Self-pay

## 2020-06-29 DIAGNOSIS — M25552 Pain in left hip: Secondary | ICD-10-CM | POA: Insufficient documentation

## 2020-06-29 DIAGNOSIS — R29898 Other symptoms and signs involving the musculoskeletal system: Secondary | ICD-10-CM | POA: Insufficient documentation

## 2020-06-29 NOTE — Therapy (Signed)
[facility] [address]  [place], [place], [address] Phone: [phone]   Fax:  [phone]  Physical Therapy Evaluation  Patient Details  Name: James [name] MRN: [id number] Date of Birth: Aug 19, 1940 Referring Provider (PT): Gwyndolyn Kaufman, MD   Encounter Date: 06/29/2020   PT End of Session - 06/29/20 1501    Visit Number 1    Number of Visits 7    Date for PT Re-Evaluation 08/10/20    Authorization Type Medicare & Federal BCBS    PT Start Time 1411    PT Stop Time 1453    PT Time Calculation (min) 42 min    Activity Tolerance Patient tolerated treatment well    Behavior During Therapy [facility] for tasks assessed/performed           Past Medical History:  Diagnosis Date  . AAA (abdominal aortic aneurysm) without rupture ([place]) 09/08/2013  . Abdominal bloating 05/19/2016  . Anxiety state 05/18/2014  . Atrial fibrillation ([place])   . 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 Benign paroxysmal positional vertigo 04/06/2013  . CAD (coronary artery disease)    nonobstructive by cath 2008; nonobstructive by Cardiac CT 11/2011  . Cervical spine fracture (H[facility]) 1980   C6-7  . Diverticulosis   . Erectile dysfunction 09/08/2013  . GERD (gastroesophageal reflux disease)   . Hiatal hernia   . History of atrial fibrillation   . History of squamous cell carcinoma 10-05-10   Dr [name], [place]  . HLD (hyperlipidemia)   . Hypothyroidism   . Medicare annual wellness visit, subsequent 03/16/2014   Sees [facility] Follows with Dr Johnsie Cancel of cardiology Sees [facility] for colonoscopy, last colonoscopy in 2013   . Mitral valve prolapse   . Neck pain 05/03/2015  . Preventative health care 03/16/2014  . 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 Syncope 02/03/2013  . 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    Past Surgical History:  Procedure Laterality Date  . APPENDECTOMY  05/2011  .  CARDIAC CATHETERIZATION  01/03/12   30% ostial LAD stenosis, ostial 30-40% D1 stenosis, smooth and normal left main and RCA; LVEF 65%  . CHOLECYSTECTOMY N/A 08/25/2014   Procedure: LAPAROSCOPIC CHOLECYSTECTOMY WITH INTRAOPERATIVE CHOLANGIOGRAM;  Surgeon: [name], MD;  Location: [facility];  Service: General;  Laterality: N/A;  . INGUINAL HERNIA REPAIR  07/2010   right  . KNEE ARTHROPLASTY     bilat  . LEFT HEART CATHETERIZATION WITH CORONARY ANGIOGRAM N/A 01/03/2012   Procedure: LEFT HEART CATHETERIZATION WITH CORONARY ANGIOGRAM;  Surgeon: [name], MD;  Location: [facility] CATH LAB;  Service: Cardiovascular;  Laterality: N/A;  . MRI  10/31/2016  . TONSILLECTOMY  1947  . tumor removal  1958   "fatty tumor"    There were no vitals filed for this visit.    Subjective Assessment - 06/29/20 1412    Subjective Patient reports that he is trying to get ready for some races as he is a runner. Reports that his MD suspected a sciatic nerve problem. Pain has been ongoing for at least 1.5 months. Pain occurs at the lower L buttock with intermittent pain down the posterior thigh. Denies N/T or burning. Worse with walking and running, particularly when starting to run. Would like to address this problem as he has not been able ot train as hard as he would like to. Currently running 4 miles 3x/week, but would like  to be able to increase his speed. Denies recent LBP.    Pertinent History syncope, neck pain, mitral valve prolapse, hypothyroidism, HLD, a-fib, hiatal hernia, GERD, C6-7 fx 1980, CAD, BPPV, anxiety, AAA, B knee arthroscopy, hx skin CA    Limitations Standing;Walking    How long can you sit comfortably? unlimited    How long can you stand comfortably? unlimited    How long can you walk comfortably? unlimited    Diagnostic tests none    Patient Stated Goals run a race in May    Currently in Pain? Yes    Pain Score 0-No pain    Pain Location Buttocks    Pain Orientation Left    Pain  Descriptors / Indicators Sharp    Pain Type Acute pain              [facility] PT Assessment - 06/29/20 1418      Assessment   Medical Diagnosis Sciatica of L side    Referring Provider (PT) Gwyndolyn Kaufman, MD    Onset Date/Surgical Date 06/05/20    Next MD Visit not scheduled    Prior Therapy yes      Balance Screen   Has the patient fallen in the past 6 months No    Has the patient had a decrease in activity level because of a fear of falling?  No    Is the patient reluctant to leave their home because of a fear of falling?  No      Home Environment   Living Environment Private residence    Living Arrangements Alone    Available Help at Discharge Family;Friend(s)    Type of [place] Access Level entry    [place] or work area in basement;One level    Home Equipment None      Prior Function   Level of Independence Independent    Vocation Retired    Leisure running      Cognition   Overall Cognitive Status Within Functional Limits for tasks assessed      Sensation   Light Touch Appears Intact      Coordination   Gross Motor Movements are Fluid and Coordinated Yes      Posture/Postural Control   Posture/Postural Control Postural limitations    Postural Limitations Rounded Shoulders;Forward head;Posterior pelvic tilt    Posture Comments in sitting      ROM / Strength   AROM / PROM / Strength AROM;Strength      AROM   AROM Assessment Site Lumbar    Lumbar Flexion toes    Lumbar Extension mildly limited    Lumbar - Right Side Bend proximal tibia    Lumbar - Left Side Bend proximal tibia    Lumbar - Right Rotation WFL    Lumbar - Left Rotation [facility]      Strength   Strength Assessment Site Hip;Knee;Ankle    Right/Left Hip Right;Left    Right Hip Flexion 4/5    Right Hip Extension 4+/5   4+/5 knee extended, 4+ knee flexed   Right Hip ABduction 4+/5    Right Hip ADduction 4+/5    Left Hip Flexion 4/5    Left Hip Extension 4/5   4/5 knee  extended, 4+ knee flexed   Left Hip ABduction 4+/5    Left Hip ADduction 4+/5    Right/Left Knee Right;Left    Right Knee Flexion 4/5    Right Knee Extension 4+/5  Left Knee Flexion 4+/5   mild buttock pain   Left Knee Extension 4+/5    Right/Left Ankle Right;Left    Right Ankle Dorsiflexion 5/5    Right Ankle Plantar Flexion 5/5   20 reps   Left Ankle Dorsiflexion 5/5    Left Ankle Plantar Flexion 5/5   20 reps     Flexibility   Soft Tissue Assessment /Muscle Length yes    Hamstrings B moderately tight    Quadriceps B moderately tight in mod thomas    ITB B WNL    Piriformis B severely tight in figure 4; moderately tight in KTOS      Palpation   Palpation comment no TTP; increased soft tissue restriction in B QL and B buttocks      Ambulation/Gait   Assistive device None    Gait Pattern Step-through pattern;Within Functional Limits    Ambulation Surface Level;Indoor    Gait velocity WFL                      Objective measurements completed on examination: See above findings.               PT Education - 06/29/20 1500    Education Details prognosis, POC, HEP, edu on use of ice 10-15 minutes, stretching before activity, performing workouts to tolerance and not pushing into pain to allow tissue healing    Person(s) Educated Patient    Methods Explanation;Demonstration;Tactile cues;Verbal cues;Handout    Comprehension Returned demonstration;Verbalized understanding            PT Short Term Goals - 06/29/20 1506      PT SHORT TERM GOAL #1   Title Patient to be independent with initial HEP.    Time 3    Period Weeks    Status New    Target Date 07/20/20             PT Long Term Goals - 06/29/20 1506      PT LONG TERM GOAL #1   Title Patient to be independent with advanced HEP.    Time 6    Period Weeks    Status New    Target Date 08/10/20      PT LONG TERM GOAL #2   Title Patient to demonstrate B LE strength >/=4+/5.    Time 6     Period Weeks    Status New    Target Date 08/10/20      PT LONG TERM GOAL #3   Title Patient to demonstrate B piriformis, HS, and hip flexors flexibility with mild tightness remaining.    Time 6    Period Weeks    Status New    Target Date 08/10/20      PT LONG TERM GOAL #4   Title Patient to return to running 4 miles 3/week without pain.    Time 6    Period Weeks    Status New    Target Date 08/10/20                  Plan - 06/29/20 1502    Clinical Impression Statement Patient is an 80 y/o M presenting to OPPT with c/o L buttock pain for the past 1.5 months. Pain occurs at the ischial tuberosity, without much radiation and no N/T. Worse when running and walking. Patient is quite active, as he runs 4 miles/week. Would like to increase his running intensity as he is preparing for a race  in May. Patient today presenting with rounded shoulders and forward head posture, normal and nonpainful lumbar AROM, decreased hip and HS strength, tightness in B HS, hip flexors/quads, piriformis, and increased soft tissue restriction in B QL and B buttocks. Patient's presentation seems more in line with a HS strain at this time. Patient was educated on gentle stretching and strengthening HEP- patient reported understanding. Would benefit from skilled PT services 1x/week for 6 weeks to address aforementioned impairments.    Personal Factors and Comorbidities Age;Comorbidity 3+;Fitness;Past/Current Experience;Time since onset of injury/illness/exacerbation    Comorbidities syncope, neck pain, mitral valve prolapse, hypothyroidism, HLD, a-fib, hiatal hernia, GERD, C6-7 fx 1980, CAD, BPPV, anxiety, AAA, B knee arthroscopy, hx skin CA    Examination-Activity Limitations Locomotion Level    Examination-Participation Restrictions Cleaning;Shop;Community Activity   running   Stability/Clinical Decision Making Stable/Uncomplicated    Clinical Decision Making Low    Rehab Potential Good    PT  Frequency 1x / week    PT Duration 6 weeks    PT Treatment/Interventions ADLs/Self Care Home Management;Cryotherapy;Electrical Stimulation;Iontophoresis 4mg /ml Dexamethasone;Moist Heat;Balance training;Therapeutic exercise;Therapeutic activities;Functional mobility training;Stair training;Gait training;Ultrasound;Neuromuscular re-education;Patient/family education;Manual techniques;Taping;Energy conservation;Dry needling;Passive range of motion    PT Next Visit Plan hip/HS FOTO, progress hip stretching and HS/hip strengthening    Consulted and Agree with Plan of Care Patient           Patient will benefit from skilled therapeutic intervention in order to improve the following deficits and impairments:  Decreased activity tolerance,Decreased strength,Increased fascial restricitons,Pain,Difficulty walking,Increased muscle spasms,Improper body mechanics,Decreased range of motion,Postural dysfunction,Impaired flexibility  Visit Diagnosis: Pain in left hip  Other symptoms and signs involving the musculoskeletal system     Problem List Patient Active Problem List   Diagnosis Date Noted  . Skin lesion of left leg 09/17/2019  . Bilateral thoracic back pain 11/27/2016  . Essential hypertension 11/27/2016  . Abdominal bloating 05/19/2016  . BPH (benign prostatic hyperplasia) 08/14/2015  . Neck pain 05/03/2015  . 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 Anxiety state 05/18/2014  . Low testosterone 03/16/2014  . Insomnia 03/16/2014  . Preventative health care 03/16/2014  . Diverticulosis of colon without hemorrhage 03/16/2014  . Knee pain 10/19/2013  . AAA (abdominal aortic aneurysm) without rupture ([place]) 09/08/2013  . Erectile dysfunction 09/08/2013  . Benign paroxysmal positional vertigo 04/06/2013  . Chest pain 01/03/2012  . Hyperlipidemia 01/03/2012  . CAD (coronary artery disease) 01/03/2012  . Hypothyroidism 01/03/2012  . GERD (gastroesophageal reflux disease) 01/03/2012   . History of atrial fibrillation   . Diverticulitis   . Mitral valve prolapse      Janene Harvey, PT, DPT 06/29/20 3:10 PM   [facility] [address]  [place], [place], [address] Phone: [phone]   Fax:  [phone]  Name: EMIN DOLLOFF MRN: [id number] Date of Birth: 1941/03/17

## 2020-07-04 ENCOUNTER — Other Ambulatory Visit: Payer: Self-pay | Admitting: Family Medicine

## 2020-07-06 ENCOUNTER — Ambulatory Visit: Payer: Medicare Other | Admitting: Physical Therapy

## 2020-07-08 ENCOUNTER — Other Ambulatory Visit: Payer: Self-pay | Admitting: Family Medicine

## 2020-07-08 NOTE — Telephone Encounter (Signed)
refilled 

## 2020-07-08 NOTE — Telephone Encounter (Signed)
Requesting: Ambien 10mg  Contract: 11/24/2016 UDS: 06/11/2018 Last Visit:03/10/2020 Next Visit: 07/09/2020 Last Refill: 04/15/2020 #30 and 2RF  Please Advise

## 2020-07-08 NOTE — Progress Notes (Signed)
Subjective:   James [name] is a 80 y.o. male who presents for Medicare Annual/Subsequent preventive examination.   Review of Systems     Cardiac Risk Factors include: male gender;advanced age (>47men, >38 women);hypertension;dyslipidemia     Objective:    Today's Vitals   07/09/20 1617  BP: 110/78  Pulse: (!) 47  Resp: 12  Temp: 97.8 F (36.6 C)  TempSrc: Oral  SpO2: 97%  Weight: 154 lb (69.9 kg)  Height: 5\' 10"  (1.778 m)   Body mass index is 22.1 kg/m.  Advanced Directives 07/09/2020 06/29/2020 03/07/2020 06/27/2019 06/11/2018 06/05/2017 05/19/2016  Does Patient Have a Medical Advance Directive? No No No No No No No  Would patient like information on creating a medical advance directive? No - Patient declined No - Patient declined - No - Patient declined Yes (MAU/Ambulatory/Procedural Areas - Information given) Yes (MAU/Ambulatory/Procedural Areas - Information given) Yes (MAU/Ambulatory/Procedural Areas - Information given)  Pre-existing out of facility DNR order (yellow form or pink MOST form) - - - - - - -    Current Medications (verified) Outpatient Encounter Medications as of 07/09/2020  Medication Sig  . aspirin EC 81 MG tablet Take 81 mg by mouth. About once a five times a week  . atorvastatin (LIPITOR) 20 MG tablet Take 1 tablet (20 mg total) by mouth daily.  . Calcium Carb-Cholecalciferol (CALCIUM 1000 + D PO) Take 1 tablet by mouth 3 (three) times a week.   . Cyanocobalamin (VITAMIN B 12) 500 MCG TABS Take 1 tablet by mouth daily.  . Dutasteride-Tamsulosin HCl 0.5-0.4 MG CAPS TAKE 1 CAPSULE BY MOUTH EVERY NIGHT AT BEDTIME  . ELDERBERRY PO Take 50 mg by mouth.   . levothyroxine (SYNTHROID) 100 MCG tablet TAKE 1 TABLET BY MOUTH EVERY MORNING BEFORE BREAKFAST  . LORazepam (ATIVAN) 0.5 MG tablet TAKE 1 TABLET BY MOUTH 2 TIMES A DAY AS NEEDED FOR ANXIETY  . meclizine (ANTIVERT) 12.5 MG tablet Take 1 tablet (12.5 mg total) by mouth 3 (three) times daily as needed for  dizziness.  . Melatonin 10 MG TABS Take 1 tablet by mouth at bedtime as needed.  . Milk Thistle 1000 MG CAPS Take 1 capsule by mouth daily.  . mirtazapine (REMERON) 15 MG tablet Take 15 mg by mouth at bedtime.  . Multiple Vitamins-Minerals (MULTIVITAMINS THER. W/MINERALS) TABS Take 1 tablet by mouth daily.  . pantoprazole (PROTONIX) 40 MG tablet Take 1 tablet (40 mg total) by mouth 2 (two) times daily before a meal.  . Probiotic Product (PROBIOTIC DAILY PO) Take by mouth.  . sildenafil (REVATIO) 20 MG tablet TAKE 1 TO 5 TABLETS BY MOUTH EVERY DAY AS NEEDED  . Testosterone 30 MG/ACT SOLN APPLY 1 PUMP UNDER EACH ARM EVERY MORNING AS DIRECTED  . zolpidem (AMBIEN) 10 MG tablet TAKE 1 TABLET BY MOUTH EVERY NIGHT AT BEDTIME AS NEEDED FOR SLEEP  . [DISCONTINUED] ALPRAZolam (XANAX) 0.25 MG tablet TAKE 1 TABLET BY MOUTH TWICE DAILY AS NEEDED FOR ANXIETY (Patient not taking: Reported on 07/09/2020)   No facility-administered encounter medications on file as of 07/09/2020.    Allergies (verified) Sulfa antibiotics   History: Past Medical History:  Diagnosis Date  . AAA (abdominal aortic aneurysm) without rupture (HCC) 09/08/2013  . Abdominal bloating 05/19/2016  . Anxiety state 05/18/2014  . Atrial fibrillation (HCC)   . 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 Benign paroxysmal positional vertigo 04/06/2013  . CAD (coronary artery disease)    nonobstructive by  cath 2008; nonobstructive by Cardiac CT 11/2011  . Cervical spine fracture (HCC) 1980   C6-7  . Diverticulosis   . Erectile dysfunction 09/08/2013  . GERD (gastroesophageal reflux disease)   . Hiatal hernia   . History of atrial fibrillation   . History of squamous cell carcinoma 10-05-10   Dr [name], [place]  . HLD (hyperlipidemia)   . Hypothyroidism   . Medicare annual wellness visit, subsequent 03/16/2014   Sees  ortho Follows with Dr Johnsie Cancel of cardiology Sees [place] for colonoscopy, last  colonoscopy in 2013   . Mitral valve prolapse   . Neck pain 05/03/2015  . Preventative health care 03/16/2014  . 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 Syncope 02/03/2013  . 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   Past Surgical History:  Procedure Laterality Date  . APPENDECTOMY  05/2011  . CARDIAC CATHETERIZATION  01/03/12   30% ostial LAD stenosis, ostial 30-40% D1 stenosis, smooth and normal left main and RCA; LVEF 65%  . CHOLECYSTECTOMY N/A 08/25/2014   Procedure: LAPAROSCOPIC CHOLECYSTECTOMY WITH INTRAOPERATIVE CHOLANGIOGRAM;  Surgeon: [name], MD;  Location: [facility];  Service: General;  Laterality: N/A;  . INGUINAL HERNIA REPAIR  07/2010   right  . KNEE ARTHROPLASTY     bilat  . LEFT HEART CATHETERIZATION WITH CORONARY ANGIOGRAM N/A 01/03/2012   Procedure: LEFT HEART CATHETERIZATION WITH CORONARY ANGIOGRAM;  Surgeon: [name], MD;  Location: [facility] CATH LAB;  Service: Cardiovascular;  Laterality: N/A;  . MRI  10/31/2016  . TONSILLECTOMY  1947  . tumor removal  1958   "fatty tumor"   Family History  Problem Relation Age of Onset  . Heart disease Mother        MI 49s  . Stroke Mother   . Hypertension Mother   . Heart disease Maternal Grandfather        MI 70-80s  . Heart disease Maternal Grandmother        MI 70-80s  . Lung disease Father        penumonitis  . Cancer Sister        colon  . Cancer Paternal Uncle        GI: colon he thinks   Social History   Socioeconomic History  . Marital status: Single    Spouse name: Not on file  . Number of children: Not on file  . Years of education: Not on file  . Highest education level: Not on file  Occupational History  . Occupation: Chief Financial Officer    Comment: Retired  Tobacco Use  . Smoking status: Never Smoker  . Smokeless tobacco: Never Used  Substance and Sexual Activity  . Alcohol use: Yes    Comment: 2 glasses red wine/day  . Drug use: No  . Sexual activity: Yes  Other Topics Concern  . Not  on file  Social History Narrative  . Not on file   Social Determinants of Health   Financial Resource Strain: Low Risk   . Difficulty of Paying Living Expenses: Not hard at all  Food Insecurity: No Food Insecurity  . Worried About Charity fundraiser in the Last Year: Never true  . Ran Out of Food in the Last Year: Never true  Transportation Needs: No Transportation Needs  . Lack of Transportation (Medical): No  . Lack of Transportation (Non-Medical): No  Physical Activity: Sufficiently Active  . Days of Exercise per Week: 3 days  . Minutes of Exercise  per Session: 60 min  Stress: No Stress Concern Present  . Feeling of Stress : Not at all  Social Connections: Moderately Integrated  . Frequency of Communication with Friends and Family: More than three times a week  . Frequency of Social Gatherings with Friends and Family: More than three times a week  . Attends Religious Services: More than 4 times per year  . Active Member of Clubs or Organizations: Yes  . Attends Archivist Meetings: 1 to 4 times per year  . Marital Status: Never married    Tobacco Counseling Counseling given: Not Answered   Clinical Intake:  Pre-visit preparation completed: Yes  Pain : No/denies pain     Nutritional Status: BMI of 19-24  Normal Nutritional Risks: None Diabetes: No  How often do you need to have someone help you when you read instructions, pamphlets, or other written materials from your doctor or pharmacy?: 1 - Never  Diabetic?No  Interpreter Needed?: No  Information entered by :: Caroleen Hamman LPN   Activities of Daily Living In your present state of health, do you have any difficulty performing the following activities: 07/09/2020  Hearing? N  Vision? N  Difficulty concentrating or making decisions? N  Walking or climbing stairs? N  Dressing or bathing? N  Doing errands, shopping? N  Preparing Food and eating ? N  Using the Toilet? N  In the past six  months, have you accidently leaked urine? N  Do you have problems with loss of bowel control? N  Managing your Medications? N  Managing your Finances? N  Housekeeping or managing your Housekeeping? N  Some recent data might be hidden    Patient Care Team: [name], MD as PCP - General (Family Medicine) Hollar, Katharine Look, MD as Referring Physician (Dermatology) Nahser, Wonda Cheng, MD as Consulting Physician (Cardiology) Carol Ada, MD as Consulting Physician (Gastroenterology) Day, Melvenia Beam, [facility] as Pharmacist (Pharmacist)  Indicate any recent Medical Services you may have received from other than [facility] providers in the past year (date may be approximate).     Assessment:   This is a routine wellness examination for [employer].  Hearing/Vision screen  Hearing Screening   125Hz  250Hz  500Hz  1000Hz  2000Hz  3000Hz  4000Hz  6000Hz  8000Hz   Right ear:           Left ear:           Comments: No issues  Vision Screening Comments: Reading glasses Last eye exam-unsure  Dietary issues and exercise activities discussed: Current Exercise Habits: Home exercise routine, Type of exercise: Other - see comments;strength training/weights (running), Time (Minutes): 60, Frequency (Times/Week): 3, Weekly Exercise (Minutes/Week): 180, Intensity: Moderate, Exercise limited by: None identified  Goals    . Chronic Care Management Pharmacy Care Plan     CARE PLAN ENTRY (see longitudinal plan of care for additional care plan information)  Current Barriers:  . Chronic Disease Management support, education, and care coordination needs related to Hypertension, Hyperlipidemia/CAD, Depression/Anxiety, Hypothyroidism, BPH, Diverticulitis, Insomnia   Hypertension BP Readings from Last 3 Encounters:  03/17/20 (!) 112/58  03/10/20 129/77  03/07/20 130/87   . Pharmacist Clinical Goal(s): o Over the next 90 days, patient will work with PharmD and providers to maintain BP goal <130/80 . Current regimen:   o Diet and exercise management   . Interventions: o Discussed BP goal . Patient self care activities - Over the next 90 days, patient will: o Maintain BP goal less than 130/80  Hyperlipidemia Lab Results  Component Value Date/Time   LDLCALC 46 09/17/2019 11:23 AM   . Pharmacist Clinical Goal(s): o Over the next 90 days, patient will work with PharmD and providers to maintain LDL goal < 70 . Current regimen:  . Atorvastatin 20mg  daily HS . Aspirin 81mg  daily AM . Patient self care activities - Over the next 90 days, patient will: o Maintain cholesterol medication regimen.   Depression/Anxiety . Pharmacist Clinical Goal(s) o Over the next 90 days, patient will work with PharmD and providers to reduce risk of symptoms associated with depression/anxiety and reduce pill burden . Current regimen:  . Alprazolam 0.25mg  twice daily as needed  . Lorazepam 0.5mg  twice daily as needed (uses about once per week) . Mirtazapine 10mg  daily . Venlafaxine 37.5mg  daily  . Interventions: o Discussed possible tapering schedule for venlafaxine pending Dr. Charlett Blake approval - Week 1: Take every other day - Week 2: Take every 3rd day (2 days between doses) - Week 3: Take every 4th day/Twice a week (3 days between doses) - Week: 4: Take once a week - Week 5: Discontinue  o Discouraged use of alprazolam and lorazepam in combination. If medication is needed, use lorazepam . Patient self care activities - Over the next 90 days, patient will: o Consider venlafaxine taper pending Dr. Charlett Blake approval o Not use alprazolam and lorazepam in combination  Health Maintenance  . Pharmacist Clinical Goal(s) o Over the next 90 days, patient will work with PharmD and providers to complete health maintenance screenings/vaccinations . Interventions: o Discussed Shingrix vaccine series o Recommended patient receive Td booster . Patient self care activities - Over the next 90 days, patient will: o Complete Shingrix  vaccine series o Receive Td booster   Medication management . Pharmacist Clinical Goal(s): o Over the next 90 days, patient will work with PharmD and providers to maintain optimal medication adherence . Current pharmacy: [facility]  . Interventions o Comprehensive medication review performed. o Continue current medication management strategy . Patient self care activities - Over the next 90 days, patient will: o Focus on medication adherence by filling and taking medications appropriately  o Take medications as prescribed o Report any questions or concerns to PharmD and/or provider(s)  Initial goal documentation     . Healthy Lifestyle     Continue to eat heart healthy diet (full of fruits, vegetables, whole grains, lean protein, water--limit salt, fat, and sugar intake) and increase physical activity as tolerated. Continue doing brain stimulating activities (puzzles, reading, adult coloring books, staying active) to keep memory sharp.      . improve muscle strength and endurance.    . Increase physical activity     Continue running 5ks.  Participate in [employer].    . Patient Stated     Complete ancestry books      Depression Screen PHQ 2/9 Scores 07/09/2020 04/23/2020 01/20/2020 09/17/2019 06/11/2018 06/05/2017 05/19/2016  PHQ - 2 Score 0 0 6 0 0 0 0  PHQ- 9 Score - 0 13 2 - - -    Fall Risk Fall Risk  07/09/2020 06/27/2019 05/15/2019 06/11/2018 06/05/2017  Falls in the past year? 0 0 0 0 No  Comment - - Emmi Telephone Survey: data to providers prior to load - -  Number falls in past yr: 0 - - - -  Injury with Fall? 0 - - - -  Follow up Falls prevention discussed Education provided;Falls prevention discussed - - -    FALL RISK PREVENTION PERTAINING TO THE  HOME:  Any stairs in or around the home? Yes  If so, are there any without handrails? No  Home free of loose throw rugs in walkways, pet beds, electrical cords, etc? Yes  Adequate lighting in your home to  reduce risk of falls? Yes   ASSISTIVE DEVICES UTILIZED TO PREVENT FALLS:  Life alert? No  Use of a cane, walker or w/c? No  Grab bars in the bathroom? Yes  Shower chair or bench in shower? No  Elevated toilet seat or a handicapped toilet? No   TIMED UP AND GO:  Was the test performed? Yes .  Length of time to ambulate 10 feet: 9 sec.   Gait steady and fast without use of assistive device  Cognitive Function:Normal cognitive status assessed by direct observation by this Nurse Health Advisor. No abnormalities found.   MMSE - Mini Mental State Exam 06/05/2017 05/19/2016  Orientation to time 5 5  Orientation to Place 5 5  Registration 3 3  Attention/ Calculation 5 5  Recall 3 3  Language- name 2 objects 2 2  Language- repeat 1 1  Language- follow 3 step command 3 3  Language- read & follow direction 1 1  Write a sentence 1 1  Copy design 1 1  Total score 30 30        Immunizations Immunization History  Administered Date(s) Administered  . Fluad Quad(high Dose 65+) 07/09/2020  . Influenza Whole 03/20/2012  . Influenza, High Dose Seasonal PF 04/02/2013, 04/23/2015  . Influenza,inj,Quad PF,6+ Mos 03/11/2014, 04/08/2019  . Influenza-Unspecified 03/03/2016, 03/20/2018  . PFIZER(Purple Top)SARS-COV-2 Vaccination 07/06/2019, 07/27/2019, 04/24/2020  . Pneumococcal Conjugate-13 03/11/2014  . Pneumococcal Polysaccharide-23 04/18/2009  . Tdap 08/05/2009  . Zoster 09/10/2008    TDAP status: Due, Education has been provided regarding the importance of this vaccine. Advised may receive this vaccine at local pharmacy or Health Dept. Aware to provide a copy of the vaccination record if obtained from local pharmacy or Health Dept. Verbalized acceptance and understanding.  Flu Vaccine status: Up to date  Pneumococcal vaccine status: Up to date  Covid-19 vaccine status: Completed vaccines  Qualifies for Shingles Vaccine? Yes   Zostavax completed Yes   Shingrix Completed?: No.     Education has been provided regarding the importance of this vaccine. Patient has been advised to call insurance company to determine out of pocket expense if they have not yet received this vaccine. Advised may also receive vaccine at local pharmacy or Health Dept. Verbalized acceptance and understanding.  Screening Tests Health Maintenance  Topic Date Due  . TETANUS/TDAP  08/06/2019  . COVID-19 Vaccine (4 - Booster for Pfizer series) 10/22/2020  . INFLUENZA VACCINE  Completed  . PNA vac Low Risk Adult  Completed    Health Maintenance  Health Maintenance Due  Topic Date Due  . TETANUS/TDAP  08/06/2019    Colorectal cancer screening: No longer required.   Lung Cancer Screening: (Low Dose CT Chest recommended if Age 37-80 years, 30 pack-year currently smoking OR have quit w/in 15years.) does not qualify.     Additional Screening:  Hepatitis C Screening: does not qualify  Vision Screening: Recommended annual ophthalmology exams for early detection of glaucoma and other disorders of the eye. Is the patient up to date with their annual eye exam?  No  Who is the provider or what is the name of the office in which the patient attends annual eye exams? Unsure of name Patient plans to make an appt  Dental Screening:  Recommended annual dental exams for proper oral hygiene  Community Resource Referral / Chronic Care Management: CRR required this visit?  No   CCM required this visit?  No      Plan:     I have personally reviewed and noted the following in the patient's chart:   . Medical and social history . Use of alcohol, tobacco or illicit drugs  . Current medications and supplements . Functional ability and status . Nutritional status . Physical activity . Advanced directives . List of other physicians . Hospitalizations, surgeries, and ER visits in previous 12 months . Vitals . Screenings to include cognitive, depression, and falls . Referrals and  appointments  In addition, I have reviewed and discussed with patient certain preventive protocols, quality metrics, and best practice recommendations. A written personalized care plan for preventive services as well as general preventive health recommendations were provided to patient.     Marta Antu, LPN   [id number]  Nurse Health Advisor  Nurse Notes: None

## 2020-07-09 ENCOUNTER — Encounter: Payer: Self-pay | Admitting: Family Medicine

## 2020-07-09 ENCOUNTER — Ambulatory Visit (INDEPENDENT_AMBULATORY_CARE_PROVIDER_SITE_OTHER): Payer: Medicare Other | Admitting: Family Medicine

## 2020-07-09 ENCOUNTER — Other Ambulatory Visit: Payer: Self-pay

## 2020-07-09 ENCOUNTER — Ambulatory Visit (INDEPENDENT_AMBULATORY_CARE_PROVIDER_SITE_OTHER): Payer: Medicare Other

## 2020-07-09 VITALS — BP 110/78 | HR 47 | Temp 97.8°F | Resp 12 | Ht 70.0 in | Wt 154.0 lb

## 2020-07-09 VITALS — BP 110/78 | HR 47 | Temp 97.8°F | Ht 70.0 in | Wt 154.0 lb

## 2020-07-09 DIAGNOSIS — R7989 Other specified abnormal findings of blood chemistry: Secondary | ICD-10-CM | POA: Diagnosis not present

## 2020-07-09 DIAGNOSIS — E785 Hyperlipidemia, unspecified: Secondary | ICD-10-CM | POA: Diagnosis not present

## 2020-07-09 DIAGNOSIS — I10 Essential (primary) hypertension: Secondary | ICD-10-CM | POA: Diagnosis not present

## 2020-07-09 DIAGNOSIS — E039 Hypothyroidism, unspecified: Secondary | ICD-10-CM

## 2020-07-09 DIAGNOSIS — N289 Disorder of kidney and ureter, unspecified: Secondary | ICD-10-CM

## 2020-07-09 DIAGNOSIS — Z79899 Other long term (current) drug therapy: Secondary | ICD-10-CM | POA: Diagnosis not present

## 2020-07-09 DIAGNOSIS — Z0000 Encounter for general adult medical examination without abnormal findings: Secondary | ICD-10-CM | POA: Diagnosis not present

## 2020-07-09 DIAGNOSIS — Z23 Encounter for immunization: Secondary | ICD-10-CM

## 2020-07-09 NOTE — Patient Instructions (Signed)
[name] , Thank you for taking time to come for your Medicare Wellness Visit. I appreciate your ongoing commitment to your health goals. Please review the following plan we discussed and let me know if I can assist you in the future.   Screening recommendations/referrals: Colonoscopy: No longer required Recommended yearly ophthalmology/optometry visit for glaucoma screening and checkup Recommended yearly dental visit for hygiene and checkup  Vaccinations: Influenza vaccine: Up to date Pneumococcal vaccine: Completed vaccines Tdap vaccine: Discuss with parmacy Shingles vaccine: Discuss with pharmacy   Covid-19: Completed vaccines  Advanced directives: Declined information today.  Conditions/risks identified: See problem list  Next appointment: Follow up in one year for your annual wellness visit. 07/15/2021 @ 2:40  Preventive Care 65 Years and Older, Male Preventive care refers to lifestyle choices and visits with your health care provider that can promote health and wellness. What does preventive care include?  A yearly physical exam. This is also called an annual well check.  Dental exams once or twice a year.  Routine eye exams. Ask your health care provider how often you should have your eyes checked.  Personal lifestyle choices, including:  Daily care of your teeth and gums.  Regular physical activity.  Eating a healthy diet.  Avoiding tobacco and drug use.  Limiting alcohol use.  Practicing safe sex.  Taking low doses of aspirin every day.  Taking vitamin and mineral supplements as recommended by your health care provider. What happens during an annual well check? The services and screenings done by your health care provider during your annual well check will depend on your age, overall health, lifestyle risk factors, and family history of disease. Counseling  Your health care provider may ask you questions about your:  Alcohol use.  Tobacco use.  Drug  use.  Emotional well-being.  Home and relationship well-being.  Sexual activity.  Eating habits.  History of falls.  Memory and ability to understand (cognition).  Work and work Statistician. Screening  You may have the following tests or measurements:  Height, weight, and BMI.  Blood pressure.  Lipid and cholesterol levels. These may be checked every 5 years, or more frequently if you are over 39 years old.  Skin check.  Lung cancer screening. You may have this screening every year starting at age 63 if you have a 30-pack-year history of smoking and currently smoke or have quit within the past 15 years.  Fecal occult blood test (FOBT) of the stool. You may have this test every year starting at age 55.  Flexible sigmoidoscopy or colonoscopy. You may have a sigmoidoscopy every 5 years or a colonoscopy every 10 years starting at age 18.  Prostate cancer screening. Recommendations will vary depending on your family history and other risks.  Hepatitis C blood test.  Hepatitis B blood test.  Sexually transmitted disease (STD) testing.  Diabetes screening. This is done by checking your blood sugar (glucose) after you have not eaten for a while (fasting). You may have this done every 1-3 years.  Abdominal aortic aneurysm (AAA) screening. You may need this if you are a current or former smoker.  Osteoporosis. You may be screened starting at age 29 if you are at high risk. Talk with your health care provider about your test results, treatment options, and if necessary, the need for more tests. Vaccines  Your health care provider may recommend certain vaccines, such as:  Influenza vaccine. This is recommended every year.  Tetanus, diphtheria, and acellular pertussis (Tdap, Td)  vaccine. You may need a Td booster every 10 years.  Zoster vaccine. You may need this after age 15.  Pneumococcal 13-valent conjugate (PCV13) vaccine. One dose is recommended after age  81.  Pneumococcal polysaccharide (PPSV23) vaccine. One dose is recommended after age 48. Talk to your health care provider about which screenings and vaccines you need and how often you need them. This information is not intended to replace advice given to you by your health care provider. Make sure you discuss any questions you have with your health care provider. Document Released: 07/03/2015 Document Revised: 02/24/2016 Document Reviewed: 04/07/2015 Elsevier Interactive Patient Education  2017 [place] Prevention in the Home Falls can cause injuries. They can happen to people of all ages. There are many things you can do to make your home safe and to help prevent falls. What can I do on the outside of my home?  Regularly fix the edges of walkways and driveways and fix any cracks.  Remove anything that might make you trip as you walk through a door, such as a raised step or threshold.  Trim any bushes or trees on the path to your home.  Use bright outdoor lighting.  Clear any walking paths of anything that might make someone trip, such as rocks or tools.  Regularly check to see if handrails are loose or broken. Make sure that both sides of any steps have handrails.  Any raised decks and porches should have guardrails on the edges.  Have any leaves, snow, or ice cleared regularly.  Use sand or salt on walking paths during winter.  Clean up any spills in your garage right away. This includes oil or grease spills. What can I do in the bathroom?  Use night lights.  Install grab bars by the toilet and in the tub and shower. Do not use towel bars as grab bars.  Use non-skid mats or decals in the tub or shower.  If you need to sit down in the shower, use a plastic, non-slip stool.  Keep the floor dry. Clean up any water that spills on the floor as soon as it happens.  Remove soap buildup in the tub or shower regularly.  Attach bath mats securely with double-sided  non-slip rug tape.  Do not have throw rugs and other things on the floor that can make you trip. What can I do in the bedroom?  Use night lights.  Make sure that you have a light by your bed that is easy to reach.  Do not use any sheets or blankets that are too big for your bed. They should not hang down onto the floor.  Have a firm chair that has side arms. You can use this for support while you get dressed.  Do not have throw rugs and other things on the floor that can make you trip. What can I do in the kitchen?  Clean up any spills right away.  Avoid walking on wet floors.  Keep items that you use a lot in easy-to-reach places.  If you need to reach something above you, use a strong step stool that has a grab bar.  Keep electrical cords out of the way.  Do not use floor polish or wax that makes floors slippery. If you must use wax, use non-skid floor wax.  Do not have throw rugs and other things on the floor that can make you trip. What can I do with my stairs?  Do not leave  any items on the stairs.  Make sure that there are handrails on both sides of the stairs and use them. Fix handrails that are broken or loose. Make sure that handrails are as long as the stairways.  Check any carpeting to make sure that it is firmly attached to the stairs. Fix any carpet that is loose or worn.  Avoid having throw rugs at the top or bottom of the stairs. If you do have throw rugs, attach them to the floor with carpet tape.  Make sure that you have a light switch at the top of the stairs and the bottom of the stairs. If you do not have them, ask someone to add them for you. What else can I do to help prevent falls?  Wear shoes that:  Do not have high heels.  Have rubber bottoms.  Are comfortable and fit you well.  Are closed at the toe. Do not wear sandals.  If you use a stepladder:  Make sure that it is fully opened. Do not climb a closed stepladder.  Make sure that both  sides of the stepladder are locked into place.  Ask someone to hold it for you, if possible.  Clearly mark and make sure that you can see:  Any grab bars or handrails.  First and last steps.  Where the edge of each step is.  Use tools that help you move around (mobility aids) if they are needed. These include:  Canes.  Walkers.  Scooters.  Crutches.  Turn on the lights when you go into a dark area. Replace any light bulbs as soon as they burn out.  Set up your furniture so you have a clear path. Avoid moving your furniture around.  If any of your floors are uneven, fix them.  If there are any pets around you, be aware of where they are.  Review your medicines with your doctor. Some medicines can make you feel dizzy. This can increase your chance of falling. Ask your doctor what other things that you can do to help prevent falls. This information is not intended to replace advice given to you by your health care provider. Make sure you discuss any questions you have with your health care provider. Document Released: 04/02/2009 Document Revised: 11/12/2015 Document Reviewed: 07/11/2014 Elsevier Interactive Patient Education  2017 [employer].

## 2020-07-09 NOTE — Patient Instructions (Signed)
High Cholesterol  High cholesterol is a condition in which the blood has high levels of a white, waxy substance similar to fat (cholesterol). The liver makes all the cholesterol that the body needs. The human body needs small amounts of cholesterol to help build cells. A person gets extra or excess cholesterol from the food that he or she eats. The blood carries cholesterol from the liver to the rest of the body. If you have high cholesterol, deposits (plaques) may build up on the walls of your arteries. Arteries are the blood vessels that carry blood away from your heart. These plaques make the arteries narrow and stiff. Cholesterol plaques increase your risk for heart attack and stroke. Work with your health care provider to keep your cholesterol levels in a healthy range. What increases the risk? The following factors may make you more likely to develop this condition:  Eating foods that are high in animal fat (saturated fat) or cholesterol.  Being overweight.  Not getting enough exercise.  A family history of high cholesterol (familial hypercholesterolemia).  Use of tobacco products.  Having diabetes. What are the signs or symptoms? There are no symptoms of this condition. How is this diagnosed? This condition may be diagnosed based on the results of a blood test.  If you are older than 81 years of age, your health care provider may check your cholesterol levels every 4-6 years.  You may be checked more often if you have high cholesterol or other risk factors for heart disease. The blood test for cholesterol measures:  "Bad" cholesterol, or LDL cholesterol. This is the main type of cholesterol that causes heart disease. The desired level is less than 100 mg/dL.  "Good" cholesterol, or HDL cholesterol. HDL helps protect against heart disease by cleaning the arteries and carrying the LDL to the liver for processing. The desired level for HDL is 60 mg/dL or higher.  Triglycerides.  These are fats that your body can store or burn for energy. The desired level is less than 150 mg/dL.  Total cholesterol. This measures the total amount of cholesterol in your blood and includes LDL, HDL, and triglycerides. The desired level is less than 200 mg/dL. How is this treated? This condition may be treated with:  Diet changes. You may be asked to eat foods that have more fiber and less saturated fats or added sugar.  Lifestyle changes. These may include regular exercise, maintaining a healthy weight, and quitting use of tobacco products.  Medicines. These are given when diet and lifestyle changes have not worked. You may be prescribed a statin medicine to help lower your cholesterol levels. Follow these instructions at home: Eating and drinking  Eat a healthy, balanced diet. This diet includes: ? Daily servings of a variety of fresh, frozen, or canned fruits and vegetables. ? Daily servings of whole grain foods that are rich in fiber. ? Foods that are low in saturated fats and trans fats. These include poultry and fish without skin, lean cuts of meat, and low-fat dairy products. ? A variety of fish, especially oily fish that contain omega-3 fatty acids. Aim to eat fish at least 2 times a week.  Avoid foods and drinks that have added sugar.  Use healthy cooking methods, such as roasting, grilling, broiling, baking, poaching, steaming, and stir-frying. Do not fry your food except for stir-frying.   Lifestyle  Get regular exercise. Aim to exercise for a total of 150 minutes a week. Increase your activity level by doing activities   such as gardening, walking, and taking the stairs.  Do not use any products that contain nicotine or tobacco, such as cigarettes, e-cigarettes, and chewing tobacco. If you need help quitting, ask your health care provider.   General instructions  Take over-the-counter and prescription medicines only as told by your health care provider.  Keep all  follow-up visits as told by your health care provider. This is important. Where to find more information  American Heart Association: www.heart.org  National Heart, Lung, and Blood Institute: www.nhlbi.nih.gov Contact a health care provider if:  You have trouble achieving or maintaining a healthy diet or weight.  You are starting an exercise program.  You are unable to stop smoking. Get help right away if:  You have chest pain.  You have trouble breathing.  You have any symptoms of a stroke. "BE FAST" is an easy way to remember the main warning signs of a stroke: ? B - Balance. Signs are dizziness, sudden trouble walking, or loss of balance. ? E - Eyes. Signs are trouble seeing or a sudden change in vision. ? F - Face. Signs are sudden weakness or numbness of the face, or the face or eyelid drooping on one side. ? A - Arms. Signs are weakness or numbness in an arm. This happens suddenly and usually on one side of the body. ? S - Speech. Signs are sudden trouble speaking, slurred speech, or trouble understanding what people say. ? T - Time. Time to call emergency services. Write down what time symptoms started.  You have other signs of a stroke, such as: ? A sudden, severe headache with no known cause. ? Nausea or vomiting. ? Seizure. These symptoms may represent a serious problem that is an emergency. Do not wait to see if the symptoms will go away. Get medical help right away. Call your local emergency services (911 in the [place] Do not drive yourself to the hospital. Summary  Cholesterol plaques increase your risk for heart attack and stroke. Work with your health care provider to keep your cholesterol levels in a healthy range.  Eat a healthy, balanced diet, get regular exercise, and maintain a healthy weight.  Do not use any products that contain nicotine or tobacco, such as cigarettes, e-cigarettes, and chewing tobacco.  Get help right away if you have any symptoms of a  stroke. This information is not intended to replace advice given to you by your health care provider. Make sure you discuss any questions you have with your health care provider. Document Revised: 05/06/2019 Document Reviewed: 05/06/2019 Elsevier Patient Education  2021 [employer]  

## 2020-07-10 LAB — COMPREHENSIVE METABOLIC PANEL
AG Ratio: 1.5 (calc) (ref 1.0–2.5)
ALT: 13 U/L (ref 9–46)
AST: 20 U/L (ref 10–35)
Albumin: 3.8 g/dL (ref 3.6–5.1)
Alkaline phosphatase (APISO): 58 U/L (ref 35–144)
BUN/Creatinine Ratio: 12 (calc) (ref 6–22)
BUN: 15 mg/dL (ref 7–25)
CO2: 29 mmol/L (ref 20–32)
Calcium: 8.9 mg/dL (ref 8.6–10.3)
Chloride: 101 mmol/L (ref 98–110)
Creat: 1.21 mg/dL — ABNORMAL HIGH (ref 0.70–1.11)
Globulin: 2.6 g/dL (calc) (ref 1.9–3.7)
Glucose, Bld: 98 mg/dL (ref 65–99)
Potassium: 5 mmol/L (ref 3.5–5.3)
Sodium: 137 mmol/L (ref 135–146)
Total Bilirubin: 0.7 mg/dL (ref 0.2–1.2)
Total Protein: 6.4 g/dL (ref 6.1–8.1)

## 2020-07-10 LAB — CBC
HCT: 39.4 % (ref 38.5–50.0)
Hemoglobin: 13.9 g/dL (ref 13.2–17.1)
MCH: 32.9 pg (ref 27.0–33.0)
MCHC: 35.3 g/dL (ref 32.0–36.0)
MCV: 93.1 fL (ref 80.0–100.0)
MPV: 10.3 fL (ref 7.5–12.5)
Platelets: 194 10*3/uL (ref 140–400)
RBC: 4.23 10*6/uL (ref 4.20–5.80)
RDW: 11.9 % (ref 11.0–15.0)
WBC: 7.4 10*3/uL (ref 3.8–10.8)

## 2020-07-10 LAB — LIPID PANEL
Cholesterol: 117 mg/dL (ref <200)
HDL: 57 mg/dL (ref >=40)
LDL Cholesterol (Calc): 46 mg/dL (calc)
Non-HDL Cholesterol (Calc): 60 mg/dL (calc) (ref <130)
Total CHOL/HDL Ratio: 2.1 (calc) (ref <5.0)
Triglycerides: 65 mg/dL (ref <150)

## 2020-07-10 LAB — TESTOSTERONE: Testosterone: 852 ng/dL — ABNORMAL HIGH (ref 250–827)

## 2020-07-10 LAB — TSH: TSH: 0.98 mIU/L (ref 0.40–4.50)

## 2020-07-11 LAB — DRUG MONITORING, PANEL 8 WITH CONFIRMATION, URINE
6 Acetylmorphine: NEGATIVE ng/mL (ref <10)
Alcohol Metabolites: POSITIVE ng/mL — AB
Amphetamines: NEGATIVE ng/mL (ref <500)
Benzodiazepines: NEGATIVE ng/mL (ref <100)
Buprenorphine, Urine: NEGATIVE ng/mL (ref <5)
Cocaine Metabolite: NEGATIVE ng/mL (ref <150)
Creatinine: 66.6 mg/dL
Ethyl Glucuronide (ETG): 3190 ng/mL — ABNORMAL HIGH (ref <500)
Ethyl Sulfate (ETS): 1568 ng/mL — ABNORMAL HIGH (ref <100)
MDMA: NEGATIVE ng/mL (ref <500)
Marijuana Metabolite: NEGATIVE ng/mL (ref <20)
Opiates: NEGATIVE ng/mL (ref <100)
Oxidant: NEGATIVE ug/mL
Oxycodone: NEGATIVE ng/mL (ref <100)
pH: 7.1 (ref 4.5–9.0)

## 2020-07-11 LAB — DM TEMPLATE

## 2020-07-12 DIAGNOSIS — N289 Disorder of kidney and ureter, unspecified: Secondary | ICD-10-CM | POA: Insufficient documentation

## 2020-07-12 NOTE — Assessment & Plan Note (Signed)
Well controlled, no changes to meds. Encouraged heart healthy diet such as the DASH diet and exercise as tolerated.  °

## 2020-07-12 NOTE — Assessment & Plan Note (Signed)
On Levothyroxine, continue to monitor 

## 2020-07-12 NOTE — Progress Notes (Signed)
Subjective:    Patient ID: James [name], male    DOB: 1940-11-03, 80 y.o.   MRN: [id number]  No chief complaint on file.   HPI Patient is in today for follow up on chronic medical concerns. No recent febrile illness or hospitalizations. 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 He feels well. He continues to maintain a heart healthy diet. Denies CP/palp/SOB/HA/congestion/fevers/GI or GU c/o. Taking meds as prescribed  Past Medical History:  Diagnosis Date  . AAA (abdominal aortic aneurysm) without rupture ([place]) 09/08/2013  . Abdominal bloating 05/19/2016  . Anxiety state 05/18/2014  . Atrial fibrillation ([place])   . 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 Benign paroxysmal positional vertigo 04/06/2013  . CAD (coronary artery disease)    nonobstructive by cath 2008; nonobstructive by Cardiac CT 11/2011  . Cervical spine fracture (HCC) 1980   C6-7  . Diverticulosis   . Erectile dysfunction 09/08/2013  . GERD (gastroesophageal reflux disease)   . Hiatal hernia   . History of atrial fibrillation   . History of squamous cell carcinoma 10-05-10   Dr [name], [place]  . HLD (hyperlipidemia)   . Hypothyroidism   . Medicare annual wellness visit, subsequent 03/16/2014   Sees [place] ortho Follows with Dr Johnsie Cancel of cardiology Sees [place] for colonoscopy, last colonoscopy in 2013   . Mitral valve prolapse   . Neck pain 05/03/2015  . Preventative health care 03/16/2014  . 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 Syncope 02/03/2013  . 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    Past Surgical History:  Procedure Laterality Date  . APPENDECTOMY  05/2011  . CARDIAC CATHETERIZATION  01/03/12   30% ostial LAD stenosis, ostial 30-40% D1 stenosis, smooth and normal left main and RCA; LVEF 65%  . CHOLECYSTECTOMY N/A 08/25/2014   Procedure: LAPAROSCOPIC CHOLECYSTECTOMY WITH INTRAOPERATIVE CHOLANGIOGRAM;  Surgeon: [name], MD;   Location: [facility];  Service: General;  Laterality: N/A;  . INGUINAL HERNIA REPAIR  07/2010   right  . KNEE ARTHROPLASTY     bilat  . LEFT HEART CATHETERIZATION WITH CORONARY ANGIOGRAM N/A 01/03/2012   Procedure: LEFT HEART CATHETERIZATION WITH CORONARY ANGIOGRAM;  Surgeon: [name], MD;  Location: [facility] CATH LAB;  Service: Cardiovascular;  Laterality: N/A;  . MRI  10/31/2016  . TONSILLECTOMY  1947  . tumor removal  1958   "fatty tumor"    Family History  Problem Relation Age of Onset  . Heart disease Mother        MI 94s  . Stroke Mother   . Hypertension Mother   . Heart disease Maternal Grandfather        MI 70-80s  . Heart disease Maternal Grandmother        MI 70-80s  . Lung disease Father        penumonitis  . Cancer Sister        colon  . Cancer Paternal Uncle        GI: colon he thinks    Social History   Socioeconomic History  . Marital status: Single    Spouse name: Not on file  . Number of children: Not on file  . Years of education: Not on file  . Highest education level: Not on file  Occupational History  . Occupation: Chief Financial Officer    Comment: Retired  Tobacco Use  . Smoking status: Never Smoker  . Smokeless tobacco: Never Used  Substance and Sexual Activity  . Alcohol use: Yes    Comment: 2 glasses red wine/day  . Drug use: No  . Sexual activity: Yes  Other Topics Concern  . Not on file  Social History Narrative  . Not on file   Social Determinants of Health   Financial Resource Strain: Low Risk   . Difficulty of Paying Living Expenses: Not hard at all  Food Insecurity: No Food Insecurity  . Worried About Charity fundraiser in the Last Year: Never true  . Ran Out of Food in the Last Year: Never true  Transportation Needs: No Transportation Needs  . Lack of Transportation (Medical): No  . Lack of Transportation (Non-Medical): No  Physical Activity: Sufficiently Active  . Days of Exercise per Week: 3 days  . Minutes of Exercise per Session: 60  min  Stress: No Stress Concern Present  . Feeling of Stress : Not at all  Social Connections: Moderately Integrated  . Frequency of Communication with Friends and Family: More than three times a week  . Frequency of Social Gatherings with Friends and Family: More than three times a week  . Attends Religious Services: More than 4 times per year  . Active Member of Clubs or Organizations: Yes  . Attends Archivist Meetings: 1 to 4 times per year  . Marital Status: Never married  Intimate Partner Violence: Not At Risk  . Fear of Current or Ex-Partner: No  . Emotionally Abused: No  . Physically Abused: No  . Sexually Abused: No    Outpatient Medications Prior to Visit  Medication Sig Dispense Refill  . aspirin EC 81 MG tablet Take 81 mg by mouth. About once a five times a week    . atorvastatin (LIPITOR) 20 MG tablet Take 1 tablet (20 mg total) by mouth daily. 90 tablet 1  . Calcium Carb-Cholecalciferol (CALCIUM 1000 + D PO) Take 1 tablet by mouth 3 (three) times a week.     . Cyanocobalamin (VITAMIN B 12) 500 MCG TABS Take 1 tablet by mouth daily.    . Dutasteride-Tamsulosin HCl 0.5-0.4 MG CAPS TAKE 1 CAPSULE BY MOUTH EVERY NIGHT AT BEDTIME 90 capsule 0  . ELDERBERRY PO Take 50 mg by mouth.     . levothyroxine (SYNTHROID) 100 MCG tablet TAKE 1 TABLET BY MOUTH EVERY MORNING BEFORE BREAKFAST 90 tablet 0  . LORazepam (ATIVAN) 0.5 MG tablet TAKE 1 TABLET BY MOUTH 2 TIMES A DAY AS NEEDED FOR ANXIETY 30 tablet 1  . meclizine (ANTIVERT) 12.5 MG tablet Take 1 tablet (12.5 mg total) by mouth 3 (three) times daily as needed for dizziness. 21 tablet 0  . Melatonin 10 MG TABS Take 1 tablet by mouth at bedtime as needed.    . Milk Thistle 1000 MG CAPS Take 1 capsule by mouth daily.    . mirtazapine (REMERON) 15 MG tablet Take 15 mg by mouth at bedtime.    . Multiple Vitamins-Minerals (MULTIVITAMINS THER. W/MINERALS) TABS Take 1 tablet by mouth daily.    . pantoprazole (PROTONIX) 40 MG  tablet Take 1 tablet (40 mg total) by mouth 2 (two) times daily before a meal. 180 tablet 3  . Probiotic Product (PROBIOTIC DAILY PO) Take by mouth.    . sildenafil (REVATIO) 20 MG tablet TAKE 1 TO 5 TABLETS BY MOUTH EVERY DAY AS NEEDED 35 tablet 3  . Testosterone 30 MG/ACT SOLN APPLY 1 PUMP UNDER EACH ARM EVERY MORNING AS DIRECTED 180 mL 0  .  zolpidem (AMBIEN) 10 MG tablet TAKE 1 TABLET BY MOUTH EVERY NIGHT AT BEDTIME AS NEEDED FOR SLEEP 30 tablet 1  . ALPRAZolam (XANAX) 0.25 MG tablet TAKE 1 TABLET BY MOUTH TWICE DAILY AS NEEDED FOR ANXIETY (Patient not taking: Reported on 07/09/2020) 20 tablet 1   No facility-administered medications prior to visit.    Allergies  Allergen Reactions  . Sulfa Antibiotics Other (See Comments)    Childhood allergy    Review of Systems  Constitutional: Negative for fever and malaise/fatigue.  HENT: Negative for congestion.   Eyes: Negative for blurred vision.  Respiratory: Negative for shortness of breath.   Cardiovascular: Negative for chest pain, palpitations and leg swelling.  Gastrointestinal: Negative for abdominal pain, blood in stool and nausea.  Genitourinary: Negative for dysuria and frequency.  Musculoskeletal: Negative for falls.  Skin: Negative for rash.  Neurological: Negative for dizziness, loss of consciousness and headaches.  Endo/Heme/Allergies: Negative for environmental allergies.  Psychiatric/Behavioral: Negative for depression. The patient is not nervous/anxious.        Objective:    Physical Exam Vitals and nursing note reviewed.  Constitutional:      General: He is not in acute distress.    Appearance: He is well-developed and well-nourished.  HENT:     Head: Normocephalic and atraumatic.     Nose: Nose normal.  Eyes:     General:        Right eye: No discharge.        Left eye: No discharge.  Cardiovascular:     Rate and Rhythm: Normal rate and regular rhythm.     Heart sounds: No murmur heard.   Pulmonary:      Effort: Pulmonary effort is normal.     Breath sounds: Normal breath sounds.  Abdominal:     General: Bowel sounds are normal.     Palpations: Abdomen is soft.     Tenderness: There is no abdominal tenderness.  Musculoskeletal:        General: No edema.     Cervical back: Normal range of motion and neck supple.  Skin:    General: Skin is warm and dry.  Neurological:     Mental Status: He is alert and oriented to person, place, and time.  Psychiatric:        Mood and Affect: Mood and affect normal.     BP 110/78 (BP Location: Left Arm, Patient Position: Sitting, Cuff Size: Large)   Pulse (!) 47   Temp 97.8 F (36.6 C) (Oral)   Ht 5\' 10"  (1.778 m)   Wt 154 lb (69.9 kg)   SpO2 97%   BMI 22.10 kg/m  Wt Readings from Last 3 Encounters:  07/09/20 154 lb (69.9 kg)  07/09/20 154 lb (69.9 kg)  06/05/20 155 lb 3.2 oz (70.4 kg)    Diabetic Foot Exam - Simple   No data filed    Lab Results  Component Value Date   WBC 7.4 07/09/2020   HGB 13.9 07/09/2020   HCT 39.4 07/09/2020   PLT 194 07/09/2020   GLUCOSE 98 07/09/2020   CHOL 117 07/09/2020   TRIG 65 07/09/2020   HDL 57 07/09/2020   LDLCALC 46 07/09/2020   ALT 13 07/09/2020   AST 20 07/09/2020   NA 137 07/09/2020   K 5.0 07/09/2020   CL 101 07/09/2020   CREATININE 1.21 (H) 07/09/2020   BUN 15 07/09/2020   CO2 29 07/09/2020   TSH 0.98 07/09/2020   PSA 0.47 01/09/2018  INR 1.09 08/21/2014    Lab Results  Component Value Date   TSH 0.98 07/09/2020   Lab Results  Component Value Date   WBC 7.4 07/09/2020   HGB 13.9 07/09/2020   HCT 39.4 07/09/2020   MCV 93.1 07/09/2020   PLT 194 07/09/2020   Lab Results  Component Value Date   NA 137 07/09/2020   K 5.0 07/09/2020   CO2 29 07/09/2020   GLUCOSE 98 07/09/2020   BUN 15 07/09/2020   CREATININE 1.21 (H) 07/09/2020   BILITOT 0.7 07/09/2020   ALKPHOS 52 03/10/2020   AST 20 07/09/2020   ALT 13 07/09/2020   PROT 6.4 07/09/2020   ALBUMIN 3.9 03/10/2020    CALCIUM 8.9 07/09/2020   ANIONGAP 11 03/07/2020   GFR 74.42 03/10/2020   Lab Results  Component Value Date   CHOL 117 07/09/2020   Lab Results  Component Value Date   HDL 57 07/09/2020   Lab Results  Component Value Date   LDLCALC 46 07/09/2020   Lab Results  Component Value Date   TRIG 65 07/09/2020   Lab Results  Component Value Date   CHOLHDL 2.1 07/09/2020   No results found for: HGBA1C     Assessment & Plan:   Problem List Items Addressed This Visit    Hyperlipidemia - Primary   Relevant Orders   Lipid panel (Completed)   Hypothyroidism    On Levothyroxine, continue to monitor      Relevant Orders   TSH (Completed)   Low testosterone    Slightly over treated . Drop dosing from 7 days a week to 5 days a week and recheck in 3 months.       Relevant Orders   Testosterone (Completed)   Essential hypertension    Well controlled, no changes to meds. Encouraged heart healthy diet such as the DASH diet and exercise as tolerated.       Relevant Orders   CBC (Completed)   Comprehensive metabolic panel (Completed)   TSH (Completed)   Renal insufficiency    Mild, encouraged to hydrate better while he trains for the [employer]. recheck cmp in a couple of weeks.        Other Visit Diagnoses    Need for immunization against influenza       Relevant Orders   Flu Vaccine QUAD High Dose(Fluad) (Completed)   Encounter for long-term (current) use of high-risk medication       Relevant Orders   DRUG MONITORING, PANEL 8 WITH CONFIRMATION, URINE (Completed)      I have discontinued Lissa Merlin E. Morikawa's ALPRAZolam. I am also having him maintain his multivitamins ther. w/minerals, Calcium Carb-Cholecalciferol (CALCIUM 1000 + D PO), Probiotic Product (PROBIOTIC DAILY PO), aspirin EC, Milk Thistle, ELDERBERRY PO, LORazepam, meclizine, Vitamin B 12, Melatonin, mirtazapine, Dutasteride-Tamsulosin HCl, levothyroxine, Testosterone, atorvastatin, sildenafil, pantoprazole,  and zolpidem.  No orders of the defined types were placed in this encounter.    Penni Homans, MD

## 2020-07-12 NOTE — Assessment & Plan Note (Signed)
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 recheck cmp in a couple of weeks.

## 2020-07-12 NOTE — Assessment & Plan Note (Signed)
Slightly over treated . Drop dosing from 7 days a week to 5 days a week and recheck in 3 months.

## 2020-07-13 ENCOUNTER — Other Ambulatory Visit: Payer: Self-pay

## 2020-07-13 ENCOUNTER — Ambulatory Visit: Payer: Medicare Other | Admitting: Physical Therapy

## 2020-07-13 DIAGNOSIS — E785 Hyperlipidemia, unspecified: Secondary | ICD-10-CM

## 2020-07-13 DIAGNOSIS — R7989 Other specified abnormal findings of blood chemistry: Secondary | ICD-10-CM

## 2020-07-16 ENCOUNTER — Encounter: Payer: Self-pay | Admitting: Physical Therapy

## 2020-07-16 ENCOUNTER — Ambulatory Visit: Payer: Medicare Other | Admitting: Physical Therapy

## 2020-07-16 ENCOUNTER — Other Ambulatory Visit: Payer: Self-pay

## 2020-07-16 DIAGNOSIS — M25552 Pain in left hip: Secondary | ICD-10-CM

## 2020-07-16 DIAGNOSIS — R29898 Other symptoms and signs involving the musculoskeletal system: Secondary | ICD-10-CM | POA: Diagnosis not present

## 2020-07-16 NOTE — Patient Instructions (Signed)
    IONTOPHORESIS PATIENT PRECAUTIONS & CONTRAINDICATIONS:  Redness under one or both electrodes can occur.  This characterized by a uniform redness that usually disappears within 12 hours of treatment. Small pinhead size blisters may result in response to the drug.  Contact your physician if the problem persists more than 24 hours. On rare occasions, iontophoresis therapy can result in temporary skin reactions such as rash, inflammation, irritation or burns.  The skin reactions may be the result of individual sensitivity to the ionic solution used, the condition of the skin at the start of treatment, reaction to the materials in the electrodes, allergies or sensitivity to dexamethasone, or a poor connection between the patch and your skin.  Discontinue using iontophoresis if you have any of these reactions and report to your therapist. Remove the Patch or electrodes if you have any undue sensation of pain or burning during the treatment and report discomfort to your therapist. Tell your Therapist if you have had known adverse reactions to the application of electrical current. Approximate treatment time is 4-6 hours.  Remove the patch after 6 hours. The Patch can be worn during normal activity, however excessive motion where the electrodes have been placed can cause poor contact between the skin and the electrode or uneven electrical current resulting in greater risk of skin irritation. Keep out of the reach of children.   DO NOT use if you have a cardiac pacemaker or any other electrically sensitive implanted device. DO NOT use if you have a known sensitivity to dexamethasone. DO NOT use during Magnetic Resonance Imaging (MRI). DO NOT use over broken or compromised skin (e.g. sunburn, cuts, or acne) due to the increased risk of skin reaction. DO NOT SHAVE over the area to be treated:  To establish good contact between the Patch and the skin, excessive hair may be clipped. DO NOT place the  Patch or electrodes on or over your eyes, directly over your heart, or brain. DO NOT reuse the Patch or electrodes as this may cause burns to occur.   For questions, please contact your therapist at:  [facility] [place] [address]  [address] [place], [place], [address] Phone: [phone]   Fax:  [phone]  

## 2020-07-16 NOTE — Therapy (Signed)
[facility] [place] [address]  [place], [place], [address] Phone: [phone]   Fax:  [phone]  Physical Therapy Treatment  Patient Details  Name: James [name] MRN: [id number] Date of Birth: 1941/03/19 Referring Provider (PT): Gwyndolyn Kaufman, MD   Encounter Date: 07/16/2020   PT End of Session - 07/16/20 1612    Visit Number 2    Number of Visits 7    Date for PT Re-Evaluation 08/10/20    Authorization Type Medicare & Federal BCBS    PT Start Time 1518    PT Stop Time 1605    PT Time Calculation (min) 47 min    Activity Tolerance Patient tolerated treatment well    Behavior During Therapy [facility] for tasks assessed/performed           Past Medical History:  Diagnosis Date  . AAA (abdominal aortic aneurysm) without rupture ([place]) 09/08/2013  . Abdominal bloating 05/19/2016  . Anxiety state 05/18/2014  . Atrial fibrillation ([place])   . 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 Benign paroxysmal positional vertigo 04/06/2013  . CAD (coronary artery disease)    nonobstructive by cath 2008; nonobstructive by Cardiac CT 11/2011  . Cervical spine fracture (HCC) 1980   C6-7  . Diverticulosis   . Erectile dysfunction 09/08/2013  . GERD (gastroesophageal reflux disease)   . Hiatal hernia   . History of atrial fibrillation   . History of squamous cell carcinoma 10-05-10   Dr [name], [place]  . HLD (hyperlipidemia)   . Hypothyroidism   . Medicare annual wellness visit, subsequent 03/16/2014   Sees [place] [facility] ortho Follows with Dr Johnsie Cancel of cardiology Sees [place] for colonoscopy, last colonoscopy in 2013   . Mitral valve prolapse   . Neck pain 05/03/2015  . Preventative health care 03/16/2014  . 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 Syncope 02/03/2013  . 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    Past Surgical History:  Procedure Laterality Date  . APPENDECTOMY  05/2011  .  CARDIAC CATHETERIZATION  01/03/12   30% ostial LAD stenosis, ostial 30-40% D1 stenosis, smooth and normal left main and RCA; LVEF 65%  . CHOLECYSTECTOMY N/A 08/25/2014   Procedure: LAPAROSCOPIC CHOLECYSTECTOMY WITH INTRAOPERATIVE CHOLANGIOGRAM;  Surgeon: [name], MD;  Location: [facility];  Service: General;  Laterality: N/A;  . INGUINAL HERNIA REPAIR  07/2010   right  . KNEE ARTHROPLASTY     bilat  . LEFT HEART CATHETERIZATION WITH CORONARY ANGIOGRAM N/A 01/03/2012   Procedure: LEFT HEART CATHETERIZATION WITH CORONARY ANGIOGRAM;  Surgeon: [name], MD;  Location: [facility] CATH LAB;  Service: Cardiovascular;  Laterality: N/A;  . MRI  10/31/2016  . TONSILLECTOMY  1947  . tumor removal  1958   "fatty tumor"    There were no vitals filed for this visit.   Subjective Assessment - 07/16/20 1519    Subjective Does not feel that his pain is any better. The pain does not stop him from running but he does feel it when running as well as when sitting down. Still running 4 miles 3x/week. Has been performing HEP atleast 1x/day.    Pertinent History syncope, neck pain, mitral valve prolapse, hypothyroidism, HLD, a-fib, hiatal hernia, GERD, C6-7 fx 1980, CAD, BPPV, anxiety, AAA, B knee arthroscopy, hx skin CA    Diagnostic tests none    Patient Stated Goals run a race in May    Currently in  Pain? No/denies              [facility] PT Assessment - 07/16/20 0001      Observation/Other Assessments   Focus on Therapeutic Outcomes (FOTO)  Hip: 75 (83)                         [facility] Adult PT Treatment/Exercise - 07/16/20 0001      Exercises   Exercises Knee/Hip      Knee/Hip Exercises: Stretches   Passive Hamstring Stretch Left;1 rep;30 seconds    Passive Hamstring Stretch Limitations supine with strap    Other Knee/Hip Stretches L KTOS/L figure 4 30" each in supine   used strap for KTOS with better form     Knee/Hip Exercises: Aerobic   Recumbent Bike L5 x 6 min      Knee/Hip  Exercises: Standing   Knee Flexion Strengthening;Left;2 sets;10 reps    Knee Flexion Limitations HS curl with yellow loop    Hip Flexion Stengthening;Both;10 reps;Knee bent;2 sets    Hip Flexion Limitations resisted march with red TB    Other Standing Knee Exercises sidestepping with red TB 4x length of counter with red loop above knees   cues for controlled stepping and avoiding lateral trunk lean     Knee/Hip Exercises: Supine   Bridges Strengthening;Both;2 sets;15 reps    Bridges Limitations HS bridge   cues to avoid lumbar extension; good tolerance     Modalities   Modalities Iontophoresis      Iontophoresis   Type of Iontophoresis Dexamethasone    Location L ischial tuberosity   redness remaining over L buttock from Salonpas patch, this area was avoided   Dose 62mA*minutes; 1.0 ml    Time 4 hour patch                  PT Education - 07/16/20 1606    Education Details update to HEP; edu on ionto precautions, wear time, removal    Person(s) Educated Patient    Methods Explanation;Demonstration;Tactile cues;Verbal cues;Handout    Comprehension Verbalized understanding;Returned demonstration            PT Short Term Goals - 07/16/20 1621      PT SHORT TERM GOAL #1   Title Patient to be independent with initial HEP.    Time 3    Period Weeks    Status Achieved    Target Date 07/20/20             PT Long Term Goals - 07/16/20 1621      PT LONG TERM GOAL #1   Title Patient to be independent with advanced HEP.    Time 6    Period Weeks    Status On-going      PT LONG TERM GOAL #2   Title Patient to demonstrate B LE strength >/=4+/5.    Time 6    Period Weeks    Status On-going      PT LONG TERM GOAL #3   Title Patient to demonstrate B piriformis, HS, and hip flexors flexibility with mild tightness remaining.    Time 6    Period Weeks    Status On-going      PT LONG TERM GOAL #4   Title Patient to return to running 4 miles 3/week without pain.     Time 6    Period Weeks    Status On-going  Plan - 07/16/20 1613    Clinical Impression Statement Patient reporting no improvement in pain levels. Notes that he continues to run but notes pain when running and when sitting down. Reviewed HEP with patient demonstrating good overall form- minor corrections/modifications made for comfort. Progressed HS and hip strengthening with patient tolerating this well and noting no pain. Updated HEP with exercises that were well-tolerated today- patient reported understanding. Educated patient on ionto wear time, precautions, and removal and applied ionto patch to painful L ischial tuberosity. Patient previously applied Salonpas patch to the L buttock which left a red mark, however this spot was avoided. Patient without complaints at end of session.    Comorbidities syncope, neck pain, mitral valve prolapse, hypothyroidism, HLD, a-fib, hiatal hernia, GERD, C6-7 fx 1980, CAD, BPPV, anxiety, AAA, B knee arthroscopy, hx skin CA    PT Treatment/Interventions ADLs/Self Care Home Management;Cryotherapy;Electrical Stimulation;Iontophoresis 4mg /ml Dexamethasone;Moist Heat;Balance training;Therapeutic exercise;Therapeutic activities;Functional mobility training;Stair training;Gait training;Ultrasound;Neuromuscular re-education;Patient/family education;Manual techniques;Taping;Energy conservation;Dry needling;Passive range of motion    PT Next Visit Plan progress hip stretching and HS/hip strengthening    Consulted and Agree with Plan of Care Patient           Patient will benefit from skilled therapeutic intervention in order to improve the following deficits and impairments:  Decreased activity tolerance,Decreased strength,Increased fascial restricitons,Pain,Difficulty walking,Increased muscle spasms,Improper body mechanics,Decreased range of motion,Postural dysfunction,Impaired flexibility  Visit Diagnosis: Pain in left hip  Other symptoms  and signs involving the musculoskeletal system     Problem List Patient Active Problem List   Diagnosis Date Noted  . Renal insufficiency 07/12/2020  . Skin lesion of left leg 09/17/2019  . Bilateral thoracic back pain 11/27/2016  . Essential hypertension 11/27/2016  . Abdominal bloating 05/19/2016  . BPH (benign prostatic hyperplasia) 08/14/2015  . Neck pain 05/03/2015  . 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 Anxiety state 05/18/2014  . Low testosterone 03/16/2014  . Insomnia 03/16/2014  . Preventative health care 03/16/2014  . Diverticulosis of colon without hemorrhage 03/16/2014  . Knee pain 10/19/2013  . AAA (abdominal aortic aneurysm) without rupture ([place]) 09/08/2013  . Erectile dysfunction 09/08/2013  . Benign paroxysmal positional vertigo 04/06/2013  . Chest pain 01/03/2012  . Hyperlipidemia 01/03/2012  . CAD (coronary artery disease) 01/03/2012  . Hypothyroidism 01/03/2012  . GERD (gastroesophageal reflux disease) 01/03/2012  . History of atrial fibrillation   . Diverticulitis   . Mitral valve prolapse      Janene Harvey, PT, DPT 07/16/20 4:23 PM   [facility] [place] [address]  [place], [place], [address] Phone: [phone]   Fax:  [phone]  Name: THADEUS GANDOLFI MRN: [id number] Date of Birth: 08-19-1940

## 2020-07-20 ENCOUNTER — Encounter: Payer: Self-pay | Admitting: Physical Therapy

## 2020-07-20 ENCOUNTER — Other Ambulatory Visit: Payer: Self-pay

## 2020-07-20 ENCOUNTER — Ambulatory Visit: Payer: Medicare Other | Admitting: Physical Therapy

## 2020-07-20 DIAGNOSIS — M25552 Pain in left hip: Secondary | ICD-10-CM

## 2020-07-20 DIAGNOSIS — R29898 Other symptoms and signs involving the musculoskeletal system: Secondary | ICD-10-CM

## 2020-07-20 NOTE — Therapy (Signed)
[facility] [address]  [place], [place], [address] Phone: [phone]   Fax:  [phone]  Physical Therapy Treatment  Patient Details  Name: James [name] MRN: [id number] Date of Birth: 03-05-1941 Referring Provider (PT): Gwyndolyn Kaufman, MD   Encounter Date: 07/20/2020   PT End of Session - 07/20/20 1530    Visit Number 3    Number of Visits 7    Date for PT Re-Evaluation 08/10/20    Authorization Type Medicare & Federal BCBS    PT Start Time 1359    PT Stop Time 1447    PT Time Calculation (min) 48 min    Activity Tolerance Patient tolerated treatment well    Behavior During Therapy [facility] for tasks assessed/performed           Past Medical History:  Diagnosis Date  . AAA (abdominal aortic aneurysm) without rupture ([place]) 09/08/2013  . Abdominal bloating 05/19/2016  . Anxiety state 05/18/2014  . Atrial fibrillation ([place])   . 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 Benign paroxysmal positional vertigo 04/06/2013  . CAD (coronary artery disease)    nonobstructive by cath 2008; nonobstructive by Cardiac CT 11/2011  . Cervical spine fracture (HCC) 1980   C6-7  . Diverticulosis   . Erectile dysfunction 09/08/2013  . GERD (gastroesophageal reflux disease)   . Hiatal hernia   . History of atrial fibrillation   . History of squamous cell carcinoma 10-05-10   Dr [name], [place]  . HLD (hyperlipidemia)   . Hypothyroidism   . Medicare annual wellness visit, subsequent 03/16/2014   Sees  ortho Follows with Dr Johnsie Cancel of cardiology Sees [place] for colonoscopy, last colonoscopy in 2013   . Mitral valve prolapse   . Neck pain 05/03/2015  . Preventative health care 03/16/2014  . 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 Syncope 02/03/2013  . 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    Past Surgical History:  Procedure Laterality Date  . APPENDECTOMY  05/2011  .  CARDIAC CATHETERIZATION  01/03/12   30% ostial LAD stenosis, ostial 30-40% D1 stenosis, smooth and normal left main and RCA; LVEF 65%  . CHOLECYSTECTOMY N/A 08/25/2014   Procedure: LAPAROSCOPIC CHOLECYSTECTOMY WITH INTRAOPERATIVE CHOLANGIOGRAM;  Surgeon: [name], MD;  Location: [facility];  Service: General;  Laterality: N/A;  . INGUINAL HERNIA REPAIR  07/2010   right  . KNEE ARTHROPLASTY     bilat  . LEFT HEART CATHETERIZATION WITH CORONARY ANGIOGRAM N/A 01/03/2012   Procedure: LEFT HEART CATHETERIZATION WITH CORONARY ANGIOGRAM;  Surgeon: [name], MD;  Location: [facility] C[facility] LAB;  Service: Cardiovascular;  Laterality: N/A;  . MRI  10/31/2016  . TONSILLECTOMY  1947  . tumor removal  1958   "fatty tumor"    There were no vitals filed for this visit.   Subjective Assessment - 07/20/20 1359    Subjective Doing a little better since the ionto patch- denies irritation.    Pertinent History syncope, neck pain, mitral valve prolapse, hypothyroidism, HLD, a-fib, hiatal hernia, GERD, C6-7 fx 1980, CAD, BPPV, anxiety, AAA, B knee arthroscopy, hx skin CA    Diagnostic tests none    Patient Stated Goals run a race in May    Currently in Pain? No/denies                             [facility]  Adult PT Treatment/Exercise - 07/20/20 0001      Exercises   Exercises Lumbar      Lumbar Exercises: Stretches   Passive Hamstring Stretch Left;30 seconds;2 reps    Passive Hamstring Stretch Limitations supine with strap      Lumbar Exercises: Machines for Strengthening   Other Lumbar Machine Exercise straight arm pulldowns 10x 20#   cues to avoid lumbar extension and maintain core contracted     Lumbar Exercises: Standing   Other Standing Lumbar Exercises L 4 way hip with green TB and 2 ski poles 10x    Other Standing Lumbar Exercises R/L resisted trunk rotatio with green TB 10x each   cues to decrease eccentric speed     Lumbar Exercises: Supine   Dead Bug 10 reps    Dead Bug  Limitations 90/90 position   good ability to maintain neutral     Knee/Hip Exercises: Aerobic   Recumbent Bike L5 x 6 min      Knee/Hip Exercises: Supine   Bridges Both;1 set;10 reps    Bridges Limitations straight leg bridge on orange pball   core instability; cues to avoid lumbar extension   Other Supine Knee/Hip Exercises bridge + HS curl on orange pball 10x; bridge + resisted march with red TB 15x   limited HS curl d/t weakness                 PT Education - 07/20/20 1530    Education Details update to HEP    Person(s) Educated Patient    Methods Explanation;Demonstration;Tactile cues;Verbal cues;Handout    Comprehension Verbalized understanding;Returned demonstration            PT Short Term Goals - 07/16/20 1621      PT SHORT TERM GOAL #1   Title Patient to be independent with initial HEP.    Time 3    Period Weeks    Status Achieved    Target Date 07/20/20             PT Long Term Goals - 07/16/20 1621      PT LONG TERM GOAL #1   Title Patient to be independent with advanced HEP.    Time 6    Period Weeks    Status On-going      PT LONG TERM GOAL #2   Title Patient to demonstrate B LE strength >/=4+/5.    Time 6    Period Weeks    Status On-going      PT LONG TERM GOAL #3   Title Patient to demonstrate B piriformis, HS, and hip flexors flexibility with mild tightness remaining.    Time 6    Period Weeks    Status On-going      PT LONG TERM GOAL #4   Title Patient to return to running 4 miles 3/week without pain.    Time 6    Period Weeks    Status On-going                 Plan - 07/20/20 1531    Clinical Impression Statement Patient reporting improvement in pain levels since application of ionto patch last session and denies irritation from it. Progressed HS and hip strengthening mat ther-ex with patient demonstrating some core instability and HS weakness, but overall with excellent effort and tolerance. Proceeded with core  strengthening with good form and muscle control.  Most difficulty appeared to be straight leg bridges and bridge with HS curl with physioball.  Updated this exercise into HEP- patient reported understanding and with no complaints at end of session.    Comorbidities syncope, neck pain, mitral valve prolapse, hypothyroidism, HLD, a-fib, hiatal hernia, GERD, C6-7 fx 1980, CAD, BPPV, anxiety, AAA, B knee arthroscopy, hx skin CA    PT Treatment/Interventions ADLs/Self Care Home Management;Cryotherapy;Electrical Stimulation;Iontophoresis 4mg /ml Dexamethasone;Moist Heat;Balance training;Therapeutic exercise;Therapeutic activities;Functional mobility training;Stair training;Gait training;Ultrasound;Neuromuscular re-education;Patient/family education;Manual techniques;Taping;Energy conservation;Dry needling;Passive range of motion    PT Next Visit Plan progress hip stretching and HS/hip strengthening    Consulted and Agree with Plan of Care Patient           Patient will benefit from skilled therapeutic intervention in order to improve the following deficits and impairments:  Decreased activity tolerance,Decreased strength,Increased fascial restricitons,Pain,Difficulty walking,Increased muscle spasms,Improper body mechanics,Decreased range of motion,Postural dysfunction,Impaired flexibility  Visit Diagnosis: Pain in left hip  Other symptoms and signs involving the musculoskeletal system     Problem List Patient Active Problem List   Diagnosis Date Noted  . Renal insufficiency 07/12/2020  . Skin lesion of left leg 09/17/2019  . Bilateral thoracic back pain 11/27/2016  . Essential hypertension 11/27/2016  . Abdominal bloating 05/19/2016  . BPH (benign prostatic hyperplasia) 08/14/2015  . Neck pain 05/03/2015  . 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 Anxiety state 05/18/2014  . Low testosterone 03/16/2014  . Insomnia 03/16/2014  . Preventative health care 03/16/2014  . Diverticulosis  of colon without hemorrhage 03/16/2014  . Knee pain 10/19/2013  . AAA (abdominal aortic aneurysm) without rupture ([place]) 09/08/2013  . Erectile dysfunction 09/08/2013  . Benign paroxysmal positional vertigo 04/06/2013  . Chest pain 01/03/2012  . Hyperlipidemia 01/03/2012  . CAD (coronary artery disease) 01/03/2012  . Hypothyroidism 01/03/2012  . GERD (gastroesophageal reflux disease) 01/03/2012  . History of atrial fibrillation   . Diverticulitis   . Mitral valve prolapse      Janene Harvey, PT, DPT 07/20/20 3:32 PM   [facility] [address]  [place], [place], [address] Phone: [phone]   Fax:  [phone]  Name: DATRELL DUNTON MRN: [id number] Date of Birth: July 13, 1940

## 2020-07-23 ENCOUNTER — Ambulatory Visit (INDEPENDENT_AMBULATORY_CARE_PROVIDER_SITE_OTHER): Payer: Medicare Other

## 2020-07-23 DIAGNOSIS — I10 Essential (primary) hypertension: Secondary | ICD-10-CM

## 2020-07-23 DIAGNOSIS — E039 Hypothyroidism, unspecified: Secondary | ICD-10-CM | POA: Diagnosis not present

## 2020-07-23 DIAGNOSIS — F411 Generalized anxiety disorder: Secondary | ICD-10-CM

## 2020-07-23 DIAGNOSIS — E785 Hyperlipidemia, unspecified: Secondary | ICD-10-CM

## 2020-07-23 NOTE — Patient Instructions (Addendum)
Visit Information  Goals Addressed            This Visit's Progress   . Chronic Care Management Pharmacy Care Plan       CARE PLAN ENTRY (see longitudinal plan of care for additional care plan information)  Current Barriers:  . Chronic Disease Management support, education, and care coordination needs related to Hypertension, Hyperlipidemia/CAD, Depression/Anxiety, Hypothyroidism, BPH, Diverticulitis, Insomnia   Hypertension BP Readings from Last 3 Encounters:  07/09/20 110/78  07/09/20 110/78  06/05/20 140/80   . Pharmacist Clinical Goal(s): o Over the next 90 days, patient will work with PharmD and providers to maintain BP goal <130/80 . Current regimen:  o Diet and exercise management   . Interventions: o Discussed BP goal . Patient self care activities - Over the next 90 days, patient will: o Maintain BP goal less than 130/80  Hyperlipidemia Lab Results  Component Value Date/Time   LDLCALC 46 07/09/2020 03:26 PM   . Pharmacist Clinical Goal(s): o Over the next 90 days, patient will work with PharmD and providers to maintain LDL goal < 70 . Current regimen:  . Atorvastatin 20mg  daily HS . Aspirin 81mg  daily AM . Patient self care activities - Over the next 90 days, patient will: o Maintain cholesterol medication regimen.   Depression/Anxiety . Pharmacist Clinical Goal(s) o Over the next 90 days, patient will work with PharmD and providers to reduce risk of symptoms associated with depression/anxiety and reduce pill burden . Current regimen:  . Lorazepam 0.5mg  twice daily as needed (uses about once per week) . Mirtazapine 10mg  daily . Interventions: o D/c venlafaxine and alprazolam from medication list . Patient self care activities - Over the next 90 days, patient will: o Continue to take medications as directed  Health Maintenance  . Pharmacist Clinical Goal(s) o Over the next 90 days, patient will work with PharmD and providers to complete health  maintenance screenings/vaccinations . Interventions: o Discussed Shingrix vaccine series o Recommended patient receive Td booster . Patient self care activities - Over the next 90 days, patient will: o Complete Shingrix vaccine series o Receive Td booster   Medication management . Pharmacist Clinical Goal(s): o Over the next 90 days, patient will work with PharmD and providers to maintain optimal medication adherence . Current pharmacy: [facility]  . Interventions o Comprehensive medication review performed. o Continue current medication management strategy . Patient self care activities - Over the next 90 days, patient will: o Focus on medication adherence by filling and taking medications appropriately  o Take medications as prescribed o Report any questions or concerns to PharmD and/or provider(s)  Please see past updates related to this goal by clicking on the "Past Updates" button in the selected goal         The patient verbalized understanding of instructions, educational materials, and care plan provided today and agreed to receive a mailed copy of patient instructions, educational materials, and care plan.   Telephone follow up appointment with pharmacy team member scheduled for: 3 months  Edythe Clarity, [facility]  Preventing High Cholesterol Cholesterol is a white, waxy substance similar to fat that the human body needs to help build cells. The liver makes all the cholesterol that a person's body needs. Having high cholesterol (hypercholesterolemia) increases your risk for heart disease and stroke. Extra or excess cholesterol comes from the food that you eat. High cholesterol can often be prevented with diet and lifestyle changes. If you already have high cholesterol, you can  control it with diet, lifestyle changes, and medicines. How can high cholesterol affect me? If you have high cholesterol, fatty deposits (plaques) may build up on the walls of your blood  vessels. The blood vessels that carry blood away from your heart are called arteries. Plaques make the arteries narrower and stiffer. This in turn can:  Restrict or block blood flow and cause blood clots to form.  Increase your risk for heart attack and stroke. What can increase my risk for high cholesterol? This condition is more likely to develop in people who:  Eat foods that are high in saturated fat or cholesterol. Saturated fat is mostly found in foods that come from animal sources.  Are overweight.  Are not getting enough exercise.  Have a family history of high cholesterol (familial hypercholesterolemia). What actions can I take to prevent this? Nutrition  Eat less saturated fat.  Avoid trans fats (partially hydrogenated oils). These are often found in margarine and in some baked goods, fried foods, and snacks bought in packages.  Avoid precooked or cured meat, such as bacon, sausages, or meat loaves.  Avoid foods and drinks that have added sugars.  Eat more fruits, vegetables, and whole grains.  Choose healthy sources of protein, such as fish, poultry, lean cuts of red meat, beans, peas, lentils, and nuts.  Choose healthy sources of fat, such as: ? Nuts. ? Vegetable oils, especially olive oil. ? Fish that have healthy fats, such as omega-3 fatty acids. These fish include mackerel or salmon.   Lifestyle  Lose weight if you are overweight. Maintaining a healthy body mass index (BMI) can help prevent or control high cholesterol. It can also lower your risk for diabetes and high blood pressure. Ask your health care provider to help you with a diet and exercise plan to lose weight safely.  Do not use any products that contain nicotine or tobacco, such as cigarettes, e-cigarettes, and chewing tobacco. If you need help quitting, ask your health care provider. Alcohol use  Do not drink alcohol if: ? Your health care provider tells you not to drink. ? You are pregnant, may  be pregnant, or are planning to become pregnant.  If you drink alcohol: ? Limit how much you use to:  0-1 drink a day for women.  0-2 drinks a day for men. ? Be aware of how much alcohol is in your drink. In the [place] one drink equals one 12 oz bottle of beer (355 mL), one 5 oz glass of wine (148 mL), or one 1 oz glass of hard liquor (44 mL). Activity  Get enough exercise. Do exercises as told by your health care provider.  Each week, do at least 150 minutes of exercise that takes a medium level of effort (moderate-intensity exercise). This kind of exercise: ? Makes your heart beat faster while allowing you to still be able to talk. ? Can be done in short sessions several times a day or longer sessions a few times a week. For example, on 5 days each week, you could walk fast or ride your bike 3 times a day for 10 minutes each time.   Medicines  Your health care provider may recommend medicines to help lower cholesterol. This may be a medicine to lower the amount of cholesterol that your liver makes. You may need medicine if: ? Diet and lifestyle changes have not lowered your cholesterol enough. ? You have high cholesterol and other risk factors for heart disease or stroke.  Take  over-the-counter and prescription medicines only as told by your health care provider. General information  Manage your risk factors for high cholesterol. Talk with your health care provider about all your risk factors and how to lower your risk.  Manage other conditions that you have, such as diabetes or high blood pressure (hypertension).  Have blood tests to check your cholesterol levels at regular points in time as told by your health care provider.  Keep all follow-up visits as told by your health care provider. This is important. Where to find more information  American Heart Association: www.heart.org  National Heart, Lung, and Blood Institute: [employer] Summary  High cholesterol  increases your risk for heart disease and stroke. By keeping your cholesterol level low, you can reduce your risk for these conditions.  High cholesterol can often be prevented with diet and lifestyle changes.  Work with your health care provider to manage your risk factors, and have your blood tested regularly. This information is not intended to replace advice given to you by your health care provider. Make sure you discuss any questions you have with your health care provider. Document Revised: 03/19/2019 Document Reviewed: 03/19/2019 Elsevier Patient Education  2021 [employer].

## 2020-07-23 NOTE — Chronic Care Management (AMB) (Signed)
Chronic Care Management Pharmacy  Name: James [name]  MRN: [id number] DOB: 11-19-1940  Chief Complaint/ HPI  [name],  80 y.o. , male presents for their Follow-Up CCM visit with the clinical pharmacist via telephone   PCP : [name], MD  Their chronic conditions include: Hypertension, Hyperlipidemia/CAD, Depression/Anxiety, Hypothyroidism, BPH, Diverticulitis, Insomnia  Office Visits: 07/09/20 Charlett Blake) - training for senior olympics, alprazolam d/c  03/10/20: Visit w/ Mackie Pai, PA-C - Atypical chest pain. Dehydration post running 5K. Labs and chest x-ray ordered. Troponin WNL, recommend pt to follow up with cardio. Chest x-ray showed lungs clear and cardiac siloutte WNL.  Consult Visit: 03/17/20: Cardio visit w/ Dr. Johney Frame - continue aspirin and statin. No BB due to bradycardia at baseline. Discussed hydration with electrolytes.  Medications: Outpatient Encounter Medications as of 07/23/2020  Medication Sig Note  . aspirin EC 81 MG tablet Take 81 mg by mouth. About once a five times a week   . atorvastatin (LIPITOR) 20 MG tablet Take 1 tablet (20 mg total) by mouth daily.   . Calcium Carb-Cholecalciferol (CALCIUM 1000 + D PO) Take 1 tablet by mouth 3 (three) times a week.    . Cyanocobalamin (VITAMIN B 12) 500 MCG TABS Take 1 tablet by mouth daily.   . Dutasteride-Tamsulosin HCl 0.5-0.4 MG CAPS TAKE 1 CAPSULE BY MOUTH EVERY NIGHT AT BEDTIME   . ELDERBERRY PO Take 50 mg by mouth.    . levothyroxine (SYNTHROID) 100 MCG tablet TAKE 1 TABLET BY MOUTH EVERY MORNING BEFORE BREAKFAST   . LORazepam (ATIVAN) 0.5 MG tablet TAKE 1 TABLET BY MOUTH 2 TIMES A DAY AS NEEDED FOR ANXIETY   . Melatonin 10 MG TABS Take 1 tablet by mouth at bedtime as needed.   . Milk Thistle 1000 MG CAPS Take 1 capsule by mouth daily.   . mirtazapine (REMERON) 15 MG tablet Take 15 mg by mouth at bedtime.   . Multiple Vitamins-Minerals (MULTIVITAMINS THER. W/MINERALS) TABS Take 1 tablet by mouth  daily.   . pantoprazole (PROTONIX) 40 MG tablet Take 1 tablet (40 mg total) by mouth 2 (two) times daily before a meal.   . Probiotic Product (PROBIOTIC DAILY PO) Take by mouth.   . sildenafil (REVATIO) 20 MG tablet TAKE 1 TO 5 TABLETS BY MOUTH EVERY DAY AS NEEDED   . Testosterone 30 MG/ACT SOLN APPLY 1 PUMP UNDER EACH ARM EVERY MORNING AS DIRECTED   . zolpidem (AMBIEN) 10 MG tablet TAKE 1 TABLET BY MOUTH EVERY NIGHT AT BEDTIME AS NEEDED FOR SLEEP   . [DISCONTINUED] meclizine (ANTIVERT) 12.5 MG tablet Take 1 tablet (12.5 mg total) by mouth 3 (three) times daily as needed for dizziness. 04/23/2020: Last used 3 weeks ago   No facility-administered encounter medications on file as of 07/23/2020.   SDOH Screenings   Alcohol Screen: Low Risk   . Last Alcohol Screening Score (AUDIT): 3  Depression (PHQ2-9): Low Risk   . PHQ-2 Score: 0  Financial Resource Strain: Low Risk   . Difficulty of Paying Living Expenses: Not hard at all  Food Insecurity: No Food Insecurity  . Worried About Charity fundraiser in the Last Year: Never true  . Ran Out of Food in the Last Year: Never true  Housing: Low Risk   . Last Housing Risk Score: 0  Physical Activity: Sufficiently Active  . Days of Exercise per Week: 3 days  . Minutes of Exercise per Session: 60 min  Social Connections: Moderately Integrated  .  Frequency of Communication with Friends and Family: More than three times a week  . Frequency of Social Gatherings with Friends and Family: More than three times a week  . Attends Religious Services: More than 4 times per year  . Active Member of Clubs or Organizations: Yes  . Attends Archivist Meetings: 1 to 4 times per year  . Marital Status: Never married  Stress: No Stress Concern Present  . Feeling of Stress : Not at all  Tobacco Use: Low Risk   . Smoking Tobacco Use: Never Smoker  . Smokeless Tobacco Use: Never Used  Transportation Needs: No Transportation Needs  . Lack of  Transportation (Medical): No  . Lack of Transportation (Non-Medical): No     Current Diagnosis/Assessment:  Goals Addressed            This Visit's Progress   . Chronic Care Management Pharmacy Care Plan       CARE PLAN ENTRY (see longitudinal plan of care for additional care plan information)  Current Barriers:  . Chronic Disease Management support, education, and care coordination needs related to Hypertension, Hyperlipidemia/CAD, Depression/Anxiety, Hypothyroidism, BPH, Diverticulitis, Insomnia   Hypertension BP Readings from Last 3 Encounters:  07/09/20 110/78  07/09/20 110/78  06/05/20 140/80   . Pharmacist Clinical Goal(s): o Over the next 90 days, patient will work with PharmD and providers to maintain BP goal <130/80 . Current regimen:  o Diet and exercise management   . Interventions: o Discussed BP goal . Patient self care activities - Over the next 90 days, patient will: o Maintain BP goal less than 130/80  Hyperlipidemia Lab Results  Component Value Date/Time   LDLCALC 46 07/09/2020 03:26 PM   . Pharmacist Clinical Goal(s): o Over the next 90 days, patient will work with PharmD and providers to maintain LDL goal < 70 . Current regimen:  . Atorvastatin 63m daily HS . Aspirin 859mdaily AM . Patient self care activities - Over the next 90 days, patient will: o Maintain cholesterol medication regimen.   Depression/Anxiety . Pharmacist Clinical Goal(s) o Over the next 90 days, patient will work with PharmD and providers to reduce risk of symptoms associated with depression/anxiety and reduce pill burden . Current regimen:  . Lorazepam 0.1m67mwice daily as needed (uses about once per week) . Mirtazapine 58m27mily . Interventions: o D/c venlafaxine and alprazolam from medication list . Patient self care activities - Over the next 90 days, patient will: o Continue to take medications as directed  Health Maintenance  . Pharmacist Clinical  Goal(s) o Over the next 90 days, patient will work with PharmD and providers to complete health maintenance screenings/vaccinations . Interventions: o Discussed Shingrix vaccine series o Recommended patient receive Td booster . Patient self care activities - Over the next 90 days, patient will: o Complete Shingrix vaccine series o Receive Td booster   Medication management . Pharmacist Clinical Goal(s): o Over the next 90 days, patient will work with PharmD and providers to maintain optimal medication adherence . Current pharmacy: [facility]  . Interventions o Comprehensive medication review performed. o Continue current medication management strategy . Patient self care activities - Over the next 90 days, patient will: o Focus on medication adherence by filling and taking medications appropriately  o Take medications as prescribed o Report any questions or concerns to PharmD and/or provider(s)  Please see past updates related to this goal by clicking on the "Past Updates" button in the selected goal  Hypertension   BP goal is:  <130/80  Office blood pressures are  BP Readings from Last 3 Encounters:  07/09/20 110/78  07/09/20 110/78  06/05/20 140/80   Patient checks BP at home infrequently Patient home BP readings are ranging: Unable to assess  Patient has failed these meds in the past: None noted  Patient is currently controlled on the following medications:  . None   Update 07/23/20 Still active training for senior olympics Not checking BP, but has always been controlled  Plan -Continue control with diet and exercise     Hyperlipidemia/CAD   LDL goal < 70  Last lipids Lab Results  Component Value Date   CHOL 117 07/09/2020   HDL 57 07/09/2020   LDLCALC 46 07/09/2020   TRIG 65 07/09/2020   CHOLHDL 2.1 07/09/2020   Hepatic Function Latest Ref Rng & Units 07/09/2020 03/10/2020 03/07/2020  Total Protein 6.1 - 8.1 g/dL 6.4 6.8 7.5  Albumin  3.5 - 5.2 g/dL - 3.9 4.3  AST 10 - 35 U/L 20 23 31   ALT 9 - 46 U/L 13 19 23   Alk Phosphatase 39 - 117 U/L - 52 51  Total Bilirubin 0.2 - 1.2 mg/dL 0.7 0.5 1.2  Bilirubin, Direct 0.0 - 0.2 mg/dL - - 0.2     The ASCVD Risk score (Caraway., et al., 2013) failed to calculate for the following reasons:   The 2013 ASCVD risk score is only valid for ages 52 to 63   Patient has failed these meds in past: pravastatin (inefficacy) Patient is currently controlled on the following medications:  . Atorvastatin 79m daily HS . Aspirin 826mdaily AM (pt taking every other day)   Update 07/23/20 [facility] Reviewed most recent lipid panel, congratulated him on excellent results Very active, encouraged him to continue  Plan -C ontinue current medications   Depression/Anxiety   [facility] 07/09/2020 04/23/2020 01/20/2020  Decreased Interest 0 0 3  Down, Depressed, Hopeless 0 0 3  PHQ - 2 Score 0 0 6  Altered sleeping - 0 1  Tired, decreased energy - 0 -  Change in appetite - 0 3  Feeling bad or failure about yourself  - 0 3  Trouble concentrating - 0 0  Moving slowly or fidgety/restless - 0 0  Suicidal thoughts - 0 0  PHQ-9 Score - 0 13  Difficult doing work/chores - - Somewhat difficult    Patient has failed these meds in past: None noted  Patient is currently controlled on the following medications:  . Lorazepam 0.44m66mwice daily as needed (uses about once per week) . Mirtazapine 54m70mily  Started venlafaxine when his soulmate passed away around July13-Aug-2021 is interested in tapering off of the venlafaxine. Agreeable to this noting he is also using mirtazapine. Will consult with PCP. Has been using CBD for about 3 weeks at bedtime.  Feels it helps with sleep.  We discussed:  Possible tapering schedule for venlafaxine. Discussed that patient should not use alprazolam and lorazepam in combination. Lorazepam preferred  benzo in elderly if benzo is needed.   Update 07/23/20 Has tapered off Venlafaxine D/c alprazolam [place] his mood is great, has started to recover from his difficult time last year  Plan -Continue current medications    Hypothyroidism   Lab Results  Component Value Date/Time   TSH 0.98 07/09/2020 03:26 PM   TSH 0.60 09/17/2019 11:23 AM    Patient  has failed these meds in past: None noted  Patient is currently controlled on the following medications:  . Levothyroxine 142m daily 6am  Update 07/23/20 Reviewed TSH, normal Reports adherence Requests refill on levothyroxine  Plan -Continue current medications  BPH   PSA  Date Value Ref Range Status  01/09/2018 0.47 0.10 - 4.00 ng/mL Final    Comment:    Test performed using Access Hybritech PSA Assay, a parmagnetic partical, chemiluminecent immunoassay.  04/23/2015 0.51 0.10 - 4.00 ng/ml Final  03/11/2014 0.62 0.10 - 4.00 ng/ml Final     Patient has failed these meds in past: None noted  Patient is currently controlled on the following medications:  . Dutasteride-tamsulosin 0.5-0.422mdaily HS  Overnight urination: 1-2 times Stream? Yes Emptying Bladder? Yes  Update 07/23/20 Requests refill Reports his stream is sufficient and is fully emptying his bladder  Plan -Continue current medications  Diverticulitis     Patient has failed these meds in past: None noted  Patient is currently controlled on the following medications: . No medications  Plans to have pt D/C in one month per gastro  Update 07/23/20 D/C his budesonide Diarrhea has subsided  Plan -Continue current medications   Insomnia    Patient has failed these meds in past: None noted  Patient is currently controlled on the following medications: . Marland Kitchenolpidem 1082maily  Feels his regimen is working   Update 07/23/20 Sleep is adequate  Plan -Continue current medications  Vaccines   Reviewed and discussed patient's vaccination  history.    Immunization History  Administered Date(s) Administered  . Fluad Quad(high Dose 65+) 07/09/2020  . Influenza Whole 03/20/2012  . Influenza, High Dose Seasonal PF 04/02/2013, 04/23/2015  . Influenza,inj,Quad PF,6+ Mos 03/11/2014, 04/08/2019  . Influenza-Unspecified 03/03/2016, 03/20/2018  . PFIZER(Purple Top)SARS-COV-2 Vaccination 07/06/2019, 07/27/2019, 04/24/2020  . Pneumococcal Conjugate-13 03/11/2014  . Pneumococcal Polysaccharide-23 04/18/2009  . Tdap 08/05/2009  . Zoster 09/10/2008   [place] flu vaccine at [facility] [address] [place] on 04/06/20  Plan  Recommended patient receive Shingrix vaccine in pharmacy.  Recommended patient receive Td vaccine  Medication Management   Pt uses [facility] for all medications Uses pill box? No - Has them lined up in the order he takes them Pt endorses rarely missing.   Miscellaneous Meds Vitamin B12 500m10maily Elderberry Melatonin Milk Thistle every other  - for liver health Calcium/Vitamin D 500mg16m0 units three times a week    Plan -Continue current medication management strategy   Follow up:  3 month phone visit  ChrisBeverly MilchrmD Clinical Pharmacist [phone]

## 2020-07-24 ENCOUNTER — Telehealth: Payer: Self-pay | Admitting: *Deleted

## 2020-07-24 ENCOUNTER — Other Ambulatory Visit: Payer: Self-pay | Admitting: Family Medicine

## 2020-07-24 MED ORDER — LORAZEPAM 0.5 MG PO TABS: ORAL_TABLET | ORAL | 2 refills | Status: DC

## 2020-07-24 MED ORDER — LEVOTHYROXINE SODIUM 100 MCG PO TABS: ORAL_TABLET | ORAL | 1 refills | Status: DC

## 2020-07-24 MED ORDER — MIRTAZAPINE 15 MG PO TABS: 15.0000 mg | ORAL_TABLET | Freq: Every day | ORAL | 1 refills | Status: DC

## 2020-07-24 MED ORDER — DUTASTERIDE-TAMSULOSIN HCL 0.5-0.4 MG PO CAPS: 1.0000 | ORAL_CAPSULE | Freq: Every day | ORAL | 1 refills | Status: DC

## 2020-07-24 NOTE — Telephone Encounter (Signed)
done

## 2020-07-24 NOTE — Telephone Encounter (Signed)
Requesting: lorazepam Contract: none UDS: 07/09/20 Last Visit: 07/09/20 Next Visit: none Last Refill: 02/10/20  Please Advise  Other rxs have been sent in.

## 2020-07-24 NOTE — Telephone Encounter (Signed)
-----   Message from Edythe Clarity, [facility] sent at 07/23/2020  2:58 PM EST ----- During our call today patient requested we go ahead and call in refills on :  Dutasteride-tamsulosin Levothyroxine Mirtazapine Lorazepam  To [facility] as he is on his last refill.  Let me know if you have any questions.  Thanks!  Beverly Milch, PharmD Clinical Pharmacist [place] [phone]

## 2020-07-24 NOTE — Telephone Encounter (Signed)
Called patient to schedule next appt.  Looks like it was not scheduled.  Needs follow up appointment around 01/06/21  Could not leave message on vm.  vm was full.

## 2020-07-27 ENCOUNTER — Other Ambulatory Visit (INDEPENDENT_AMBULATORY_CARE_PROVIDER_SITE_OTHER): Payer: Medicare Other

## 2020-07-27 ENCOUNTER — Ambulatory Visit: Payer: Medicare Other | Attending: Cardiology | Admitting: Physical Therapy

## 2020-07-27 ENCOUNTER — Encounter: Payer: Self-pay | Admitting: Physical Therapy

## 2020-07-27 ENCOUNTER — Other Ambulatory Visit: Payer: Self-pay

## 2020-07-27 DIAGNOSIS — R29898 Other symptoms and signs involving the musculoskeletal system: Secondary | ICD-10-CM | POA: Diagnosis not present

## 2020-07-27 DIAGNOSIS — E785 Hyperlipidemia, unspecified: Secondary | ICD-10-CM

## 2020-07-27 DIAGNOSIS — M25552 Pain in left hip: Secondary | ICD-10-CM | POA: Diagnosis not present

## 2020-07-27 NOTE — Telephone Encounter (Signed)
Appt made

## 2020-07-27 NOTE — Therapy (Signed)
[facility] [address]  [place], [place], [address] Phone: [phone]   Fax:  [phone]  Physical Therapy Treatment  Patient Details  Name: James [name] MRN: [id number] Date of Birth: 16-May-1941 Referring Provider (PT): Gwyndolyn Kaufman, MD   Encounter Date: 07/27/2020   PT End of Session - 07/27/20 1452    Visit Number 4    Number of Visits 7    Date for PT Re-Evaluation 08/10/20    Authorization Type Medicare & Federal BCBS    PT Start Time 1540    PT Stop Time 1447    PT Time Calculation (min) 49 min    Activity Tolerance Patient tolerated treatment well    Behavior During Therapy [facility] for tasks assessed/performed           Past Medical History:  Diagnosis Date  . AAA (abdominal aortic aneurysm) without rupture ([place]) 09/08/2013  . Abdominal bloating 05/19/2016  . Anxiety state 05/18/2014  . Atrial fibrillation ([place])   . 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 Benign paroxysmal positional vertigo 04/06/2013  . CAD (coronary artery disease)    nonobstructive by cath 2008; nonobstructive by Cardiac CT 11/2011  . Cervical spine fracture ([facility]) 1980   C6-7  . Diverticulosis   . Erectile dysfunction 09/08/2013  . GERD (gastroesophageal reflux disease)   . Hiatal hernia   . History of atrial fibrillation   . History of squamous cell carcinoma 10-05-10   Dr [name], [place]  . HLD (hyperlipidemia)   . Hypothyroidism   . Medicare annual wellness visit, subsequent 03/16/2014   Sees [facility] Follows with Dr Johnsie Cancel of cardiology Sees [place] for colonoscopy, last colonoscopy in 2013   . Mitral valve prolapse   . Neck pain 05/03/2015  . Preventative health care 03/16/2014  . 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 Syncope 02/03/2013  . 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    Past Surgical History:  Procedure Laterality Date  . APPENDECTOMY  05/2011  .  CARDIAC CATHETERIZATION  01/03/12   30% ostial LAD stenosis, ostial 30-40% D1 stenosis, smooth and normal left main and RCA; LVEF 65%  . CHOLECYSTECTOMY N/A 08/25/2014   Procedure: LAPAROSCOPIC CHOLECYSTECTOMY WITH INTRAOPERATIVE CHOLANGIOGRAM;  Surgeon: [name], MD;  Location: [facility];  Service: General;  Laterality: N/A;  . INGUINAL HERNIA REPAIR  07/2010   right  . KNEE ARTHROPLASTY     bilat  . LEFT HEART CATHETERIZATION WITH CORONARY ANGIOGRAM N/A 01/03/2012   Procedure: LEFT HEART CATHETERIZATION WITH CORONARY ANGIOGRAM;  Surgeon: [name], MD;  Location[facility] CATH LAB;  Service: Cardiovascular;  Laterality: N/A;  . MRI  10/31/2016  . TONSILLECTOMY  1947  . tumor removal  1958   "fatty tumor"    There were no vitals filed for this visit.   Subjective Assessment - 07/27/20 1359    Subjective Noticed a shooting pain in the L buttock after a run yesterday. This is now present at rest. Pain is a bit  better overall, however.    Pertinent History syncope, neck pain, mitral valve prolapse, hypothyroidism, HLD, a-fib, hiatal hernia, GERD, C6-7 fx 1980, CAD, BPPV, anxiety, AAA, B knee arthroscopy, hx skin CA    Diagnostic tests none    Patient Stated Goals run a race in May    Currently in Pain? Yes    Pain Score 3     Pain Location Buttocks  Pain Orientation Left    Pain Descriptors / Indicators Shooting    Pain Type Acute pain                             [facility] Adult PT Treatment/Exercise - 07/27/20 0001      Lumbar Exercises: Stretches   Passive Hamstring Stretch Left;2 reps;30 seconds    Passive Hamstring Stretch Limitations supine with strap      Lumbar Exercises: Supine   Other Supine Lumbar Exercises LEs 90/90 paloff press with green TB 10x each side    Other Supine Lumbar Exercises 90/90 overhead yellow medball lift 2x10   cues to maintain neutral spine     Knee/Hip Exercises: Aerobic   Recumbent Bike L6 x 6 min      Knee/Hip Exercises:  Standing   Walking with Sports Cord sidestepping with red loop around ankles 2x2ft   cues to avoid lateral trunk lean   Other Standing Knee Exercises hip hinge with dowel held as barbell 10x   to tolerance   Other Standing Knee Exercises R/L single leg RDL to 2 bolsters on mat 5x each   limited by imbalance     Knee/Hip Exercises: Supine   Bridges Both;10 reps;2 sets    Bridges Limitations straight leg bridge on orange pball   instability     Manual Therapy   Manual Therapy Soft tissue mobilization;Taping    Manual therapy comments prone    Soft tissue mobilization cross friction massage to L HS origin   patient tolerated well   K[employer] IT sales professional 2 strips crossing over L ischial tiberosity down to medial and lateral HS at 50% stretch                  PT Education - 07/27/20 1451    Education Details edu on KT tape wear time, precautions, removal    Person(s) Educated Patient    Methods Explanation    Comprehension Verbalized understanding            PT Short Term Goals - 07/16/20 1621      PT SHORT TERM GOAL #1   Title Patient to be independent with initial HEP.    Time 3    Period Weeks    Status Achieved    Target Date 07/20/20             PT Long Term Goals - 07/16/20 1621      PT LONG TERM GOAL #1   Title Patient to be independent with advanced HEP.    Time 6    Period Weeks    Status On-going      PT LONG TERM GOAL #2   Title Patient to demonstrate B LE strength >/=4+/5.    Time 6    Period Weeks    Status On-going      PT LONG TERM GOAL #3   Title Patient to demonstrate B piriformis, HS, and hip flexors flexibility with mild tightness remaining.    Time 6    Period Weeks    Status On-going      PT LONG TERM GOAL #4   Title Patient to return to running 4 miles 3/week without pain.    Time 6    Period Weeks    Status On-going  Plan - 07/27/20 1452    Clinical Impression  Statement Patient reporting mild shooting pain in the L buttock after taking a run yesterday which is now present at rest. Encouraged patient to use "dynamic workup" video before running and cool down with more static stretching as patient reports typically doing static stretching before runs. Proceeded with core and hip strengthening ther-ex while avoiding aggravating movements/positions to avoid flare. Patient tolerated gentle cross friction massage over the L HS origin well, with report of slight relief. Applied KT tape to L HS origin for pain relief- patient reported understanding of KT tape wear time, precautions, removal. No complaints at end of session.    Comorbidities syncope, neck pain, mitral valve prolapse, hypothyroidism, HLD, a-fib, hiatal hernia, GERD, C6-7 fx 1980, CAD, BPPV, anxiety, AAA, B knee arthroscopy, hx skin CA    PT Treatment/Interventions ADLs/Self Care Home Management;Cryotherapy;Electrical Stimulation;Iontophoresis 4mg /ml Dexamethasone;Moist Heat;Balance training;Therapeutic exercise;Therapeutic activities;Functional mobility training;Stair training;Gait training;Ultrasound;Neuromuscular re-education;Patient/family education;Manual techniques;Taping;Energy conservation;Dry needling;Passive range of motion    PT Next Visit Plan progress hip stretching and HS/hip strengthening    Consulted and Agree with Plan of Care Patient           Patient will benefit from skilled therapeutic intervention in order to improve the following deficits and impairments:  Decreased activity tolerance,Decreased strength,Increased fascial restricitons,Pain,Difficulty walking,Increased muscle spasms,Improper body mechanics,Decreased range of motion,Postural dysfunction,Impaired flexibility  Visit Diagnosis: Pain in left hip  Other symptoms and signs involving the musculoskeletal system     Problem List Patient Active Problem List   Diagnosis Date Noted  . Renal insufficiency 07/12/2020  .  Skin lesion of left leg 09/17/2019  . Bilateral thoracic back pain 11/27/2016  . Essential hypertension 11/27/2016  . Abdominal bloating 05/19/2016  . BPH (benign prostatic hyperplasia) 08/14/2015  . Neck pain 05/03/2015  . 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 Anxiety state 05/18/2014  . Low testosterone 03/16/2014  . Insomnia 03/16/2014  . Preventative health care 03/16/2014  . Diverticulosis of colon without hemorrhage 03/16/2014  . Knee pain 10/19/2013  . AAA (abdominal aortic aneurysm) without rupture ([place]) 09/08/2013  . Erectile dysfunction 09/08/2013  . Benign paroxysmal positional vertigo 04/06/2013  . Chest pain 01/03/2012  . Hyperlipidemia 01/03/2012  . CAD (coronary artery disease) 01/03/2012  . Hypothyroidism 01/03/2012  . GERD (gastroesophageal reflux disease) 01/03/2012  . History of atrial fibrillation   . Diverticulitis   . Mitral valve prolapse      Janene Harvey, PT, DPT 07/27/20 2:54 PM   [facility] [name]  [place], [place], [address] Phone: [phone]   Fax:  [phone]  Name: DAVED BAUERNFEIND MRN: [id number] Date of Birth: 08-06-1940

## 2020-07-28 ENCOUNTER — Telehealth: Payer: Self-pay

## 2020-07-28 LAB — COMPREHENSIVE METABOLIC PANEL
ALT: 13 U/L (ref 0–53)
AST: 19 U/L (ref 0–37)
Albumin: 3.8 g/dL (ref 3.5–5.2)
Alkaline Phosphatase: 47 U/L (ref 39–117)
BUN: 15 mg/dL (ref 6–23)
CO2: 31 mEq/L (ref 19–32)
Calcium: 8.7 mg/dL (ref 8.4–10.5)
Chloride: 98 mEq/L (ref 96–112)
Creatinine, Ser: 0.87 mg/dL (ref 0.40–1.50)
GFR: 81.42 mL/min (ref >60.00)
Glucose, Bld: 103 mg/dL — ABNORMAL HIGH (ref 70–99)
Potassium: 4.2 mEq/L (ref 3.5–5.1)
Sodium: 134 mEq/L — ABNORMAL LOW (ref 135–145)
Total Bilirubin: 0.6 mg/dL (ref 0.2–1.2)
Total Protein: 6.3 g/dL (ref 6.0–8.3)

## 2020-07-28 NOTE — Telephone Encounter (Signed)
Pt will be calling back in to schedule lab recheck for a month.

## 2020-08-03 ENCOUNTER — Other Ambulatory Visit: Payer: Self-pay

## 2020-08-03 ENCOUNTER — Ambulatory Visit: Payer: Medicare Other

## 2020-08-03 DIAGNOSIS — R29898 Other symptoms and signs involving the musculoskeletal system: Secondary | ICD-10-CM | POA: Diagnosis not present

## 2020-08-03 DIAGNOSIS — M25552 Pain in left hip: Secondary | ICD-10-CM

## 2020-08-03 NOTE — Therapy (Addendum)
[facility] [address]  [place], [place], [address] Phone: [phone]   Fax:  [phone]  Physical Therapy Treatment  Patient Details  Name: James [name] MRN: [id number] Date of Birth: 04-22-1941 Referring Provider (PT): Gwyndolyn Kaufman, MD   Progress Note Reporting Period 06/29/20 to 08/03/20  See note below for Objective Data and Assessment of Progress/Goals.     Encounter Date: 08/03/2020   PT End of Session - 08/03/20 1406    Visit Number 5    Number of Visits 7    Date for PT Re-Evaluation 08/10/20    Authorization Type Medicare & Federal BCBS    PT Start Time 1401    PT Stop Time 1452    PT Time Calculation (min) 51 min    Activity Tolerance Patient tolerated treatment well    Behavior During Therapy WFL for tasks assessed/performed           Past Medical History:  Diagnosis Date  . AAA (abdominal aortic aneurysm) without rupture ([place]) 09/08/2013  . Abdominal bloating 05/19/2016  . Anxiety state 05/18/2014  . Atrial fibrillation ([place])   . 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 Benign paroxysmal positional vertigo 04/06/2013  . CAD (coronary artery disease)    nonobstructive by cath 2008; nonobstructive by Cardiac CT 11/2011  . Cervical spine fracture (HCC) 1980   C6-7  . Diverticulosis   . Erectile dysfunction 09/08/2013  . GERD (gastroesophageal reflux disease)   . Hiatal hernia   . History of atrial fibrillation   . History of squamous cell carcinoma 10-05-10   Dr [name], [place]  . HLD (hyperlipidemia)   . Hypothyroidism   . Medicare annual wellness visit, subsequent 03/16/2014   Sees [facility] Follows with Dr Johnsie Cancel of cardiology Sees [facility] for colonoscopy, last colonoscopy in 2013   . Mitral valve prolapse   . Neck pain 05/03/2015  . Preventative health care 03/16/2014  . 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 Syncope  02/03/2013  . 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    Past Surgical History:  Procedure Laterality Date  . APPENDECTOMY  05/2011  . CARDIAC CATHETERIZATION  01/03/12   30% ostial LAD stenosis, ostial 30-40% D1 stenosis, smooth and normal left main and RCA; LVEF 65%  . CHOLECYSTECTOMY N/A 08/25/2014   Procedure: LAPAROSCOPIC CHOLECYSTECTOMY WITH INTRAOPERATIVE CHOLANGIOGRAM;  Surgeon: [name], MD;  Location: [facility];  Service: General;  Laterality: N/A;  . INGUINAL HERNIA REPAIR  07/2010   right  . KNEE ARTHROPLASTY     bilat  . LEFT HEART CATHETERIZATION WITH CORONARY ANGIOGRAM N/A 01/03/2012   Procedure: LEFT HEART CATHETERIZATION WITH CORONARY ANGIOGRAM;  Surgeon: [name], MD;  Location: [facility] CATH LAB;  Service: Cardiovascular;  Laterality: N/A;  . MRI  10/31/2016  . TONSILLECTOMY  1947  . tumor removal  1958   "fatty tumor"    There were no vitals filed for this visit.   Subjective Assessment - 08/03/20 1359    Subjective Pt reported he was able to run 4 miles over the weekend also states that he pushed it a little bit when he ran but has no pain starting therapy    Pertinent History syncope, neck pain, mitral valve prolapse, hypothyroidism, HLD, a-fib, hiatal hernia, GERD, C6-7 fx 1980, CAD, BPPV, anxiety, AAA, B knee arthroscopy, hx skin CA    Diagnostic tests none    Patient Stated  Goals run a race in May    Currently in Pain? No/denies    Pain Score 0-No pain                             [facility] Adult PT Treatment/Exercise - 08/03/20 0001      Lumbar Exercises: Aerobic   Recumbent Bike L5 6 min      Lumbar Exercises: Supine   Other Supine Lumbar Exercises trunk rotation while holding legs up 10 reps    Other Supine Lumbar Exercises multifidus walkout green tband 4x30 ft   cues for lat trunk lean     Knee/Hip Exercises: Stretches   Passive Hamstring Stretch Both;1 rep;30 seconds    Passive Hamstring Stretch Limitations supine with strap    Other  Knee/Hip Stretches KTOS seated Both 1x30 sec      Knee/Hip Exercises: Machines for Strengthening   Cybex Knee Extension 35# 10 reps    Cybex Knee Flexion 45# 10 reps      Knee/Hip Exercises: Standing   Knee Flexion Strengthening;Both;2 sets;10 reps    Knee Flexion Limitations marches; red tband    Forward Step Up Left;1 set;15 reps    Forward Step Up Limitations 6'' step    Walking with Sports Cord sidestepping with red loop around ankles   4x30 ft     Knee/Hip Exercises: Supine   Bridges Both;10 reps;2 sets    Bridges Limitations straight leg bridge on orange pball      Manual Therapy   [employer] geologist Space 2 strips crossing over L ischial tiberosity down to medial and lateral HS at 50% stretch                    PT Short Term Goals - 07/16/20 1621      PT SHORT TERM GOAL #1   Title Patient to be independent with initial HEP.    Time 3    Period Weeks    Status Achieved    Target Date 07/20/20             PT Long Term Goals - 07/16/20 1621      PT LONG TERM GOAL #1   Title Patient to be independent with advanced HEP.    Time 6    Period Weeks    Status On-going      PT LONG TERM GOAL #2   Title Patient to demonstrate B LE strength >/=4+/5.    Time 6    Period Weeks    Status On-going      PT LONG TERM GOAL #3   Title Patient to demonstrate B piriformis, HS, and hip flexors flexibility with mild tightness remaining.    Time 6    Period Weeks    Status On-going      PT LONG TERM GOAL #4   Title Patient to return to running 4 miles 3/week without pain.    Time 6    Period Weeks    Status On-going                 Plan - 08/03/20 1512    Clinical Impression Statement Pt completed the exercises with cueing for proper performance and to target the isolated muscle groups. Cues needed during standing ther ex to prevent lateral trunk lean during multifidus walkouts and sidesteps. Pt showed difficulty and  instability while completing the  trunk rotations in supine, rest breaks required in between reps. Pt had a little discomfort in the L buttock while doing the bridges on the yoga ball but it subsided. KT tape used post session for pain relief, with reports of relief per pt statement. Good response to session overall.    Personal Factors and Comorbidities Age;Comorbidity 3+;Fitness;Past/Current Experience;Time since onset of injury/illness/exacerbation    Comorbidities syncope, neck pain, mitral valve prolapse, hypothyroidism, HLD, a-fib, hiatal hernia, GERD, C6-7 fx 1980, CAD, BPPV, anxiety, AAA, B knee arthroscopy, hx skin CA    PT Treatment/Interventions ADLs/Self Care Home Management;Cryotherapy;Electrical Stimulation;Iontophoresis 12m/ml Dexamethasone;Moist Heat;Balance training;Therapeutic exercise;Therapeutic activities;Functional mobility training;Stair training;Gait training;Ultrasound;Neuromuscular re-education;Patient/family education;Manual techniques;Taping;Energy conservation;Dry needling;Passive range of motion    PT Next Visit Plan progress hip stretching and HS/hip strengthening           Patient will benefit from skilled therapeutic intervention in order to improve the following deficits and impairments:  Decreased activity tolerance,Decreased strength,Increased fascial restricitons,Pain,Difficulty walking,Increased muscle spasms,Improper body mechanics,Decreased range of motion,Postural dysfunction,Impaired flexibility  Visit Diagnosis: Pain in left hip  Other symptoms and signs involving the musculoskeletal system     Problem List Patient Active Problem List   Diagnosis Date Noted  . Renal insufficiency 07/12/2020  . Skin lesion of left leg 09/17/2019  . Bilateral thoracic back pain 11/27/2016  . Essential hypertension 11/27/2016  . Abdominal bloating 05/19/2016  . BPH (benign prostatic hyperplasia) 08/14/2015  . Neck pain 05/03/2015  . BCC (basal cell carcinoma of  skin) 06/09/2014  . Anxiety state 05/18/2014  . Low testosterone 03/16/2014  . Insomnia 03/16/2014  . Preventative health care 03/16/2014  . Diverticulosis of colon without hemorrhage 03/16/2014  . Knee pain 10/19/2013  . AAA [place] aortic [place]) [place] rupture ([place] 09/08/2013  . Erectile dysfunction 09/08/2013  . Benign paroxysmal positional vertigo 04/06/2013  . Chest pain 01/03/2012  . Hyperlipidemia 01/03/2012  . CAD (coronary artery disease) 01/03/2012  . Hypothyroidism 01/03/2012  . GERD (gastroesophageal reflux disease) 01/03/2012  . History of atrial fibrillation   . Diverticulitis   . Mitral valve prolapse     BArtist Pais PTA 08/03/2020, 3:24 PM  [facility] [place] [address]Phone: [phone]  Fax:  [phone] Name: James KATZENSTEINMRN: [id number] of Birth: [birth year]  PHYSICAL THERAPY DISCHARGE SUMMARY  Visits from Start of Care: 5  Current functional level related to goals / functional outcomes: Unable to assess; patient did not return   Remaining deficits: Unable to assess   Education / Equipment: HEP  Plan: Patient agrees to discharge.  Patient goals were not met. Patient is being discharged due to not returning since the last visit.  ?????     YJanene Harvey PT, DPT 10/07/20 11:56 AM

## 2020-08-07 ENCOUNTER — Other Ambulatory Visit: Payer: Self-pay | Admitting: Family Medicine

## 2020-08-07 DIAGNOSIS — I10 Essential (primary) hypertension: Secondary | ICD-10-CM

## 2020-08-07 NOTE — Telephone Encounter (Signed)
Requesting: testosterone 30mg /act SLN Contract: n/a UDS: n/a Last Visit: 07/09/2020 Next Visit: 01/11/2021 Last Refill: 05/11/2020 #116mL and 0RF  Please Advise

## 2020-08-20 ENCOUNTER — Telehealth: Payer: Self-pay | Admitting: Family Medicine

## 2020-08-20 NOTE — Telephone Encounter (Signed)
States [facility] needs a prescription for shingle vaccination.

## 2020-08-21 MED ORDER — SHINGRIX 50 MCG/0.5ML IM SUSR: 0.5000 mL | Freq: Once | INTRAMUSCULAR | 1 refills | Status: AC

## 2020-08-21 NOTE — Telephone Encounter (Signed)
Spoke with patient and advised that rx was sent in and just know that this one is two shots.    Rx sent in with 1 refill.

## 2020-09-03 ENCOUNTER — Other Ambulatory Visit: Payer: Self-pay | Admitting: Family Medicine

## 2020-09-03 NOTE — Telephone Encounter (Signed)
Requesting: Ambien 10mg  Contract: 11/24/2016 UDS: 07/09/2020 Last Visit: 07/09/2020 Next Visit: 01/11/2021 Last Refill: 07/08/2020 #30 and 1RF  Please Advise

## 2020-09-21 ENCOUNTER — Other Ambulatory Visit (INDEPENDENT_AMBULATORY_CARE_PROVIDER_SITE_OTHER): Payer: Medicare Other

## 2020-09-21 ENCOUNTER — Other Ambulatory Visit: Payer: Self-pay

## 2020-09-21 DIAGNOSIS — E785 Hyperlipidemia, unspecified: Secondary | ICD-10-CM | POA: Diagnosis not present

## 2020-09-21 DIAGNOSIS — R7989 Other specified abnormal findings of blood chemistry: Secondary | ICD-10-CM | POA: Diagnosis not present

## 2020-09-21 NOTE — Addendum Note (Signed)
Addended by: Manuela Schwartz on: 09/21/2020 02:20 PM   Modules accepted: Orders

## 2020-09-22 ENCOUNTER — Other Ambulatory Visit: Payer: Self-pay

## 2020-09-22 DIAGNOSIS — R7989 Other specified abnormal findings of blood chemistry: Secondary | ICD-10-CM

## 2020-09-22 LAB — COMPREHENSIVE METABOLIC PANEL
ALT: 17 U/L (ref 0–53)
AST: 20 U/L (ref 0–37)
Albumin: 4.3 g/dL (ref 3.5–5.2)
Alkaline Phosphatase: 49 U/L (ref 39–117)
BUN: 18 mg/dL (ref 6–23)
CO2: 28 mEq/L (ref 19–32)
Calcium: 9.4 mg/dL (ref 8.4–10.5)
Chloride: 101 mEq/L (ref 96–112)
Creatinine, Ser: 0.98 mg/dL (ref 0.40–1.50)
GFR: 72.68 mL/min (ref >60.00)
Glucose, Bld: 77 mg/dL (ref 70–99)
Potassium: 4.1 mEq/L (ref 3.5–5.1)
Sodium: 137 mEq/L (ref 135–145)
Total Bilirubin: 0.6 mg/dL (ref 0.2–1.2)
Total Protein: 7 g/dL (ref 6.0–8.3)

## 2020-09-22 LAB — TESTOSTERONE TOTAL,FREE,BIO, MALES
Albumin: 4.3 g/dL (ref 3.6–5.1)
Sex Hormone Binding: 47 nmol/L (ref 22–77)
Testosterone, Bioavailable: 175.7 ng/dL — ABNORMAL HIGH (ref 15.0–150.0)
Testosterone, Free: 89.2 pg/mL — ABNORMAL HIGH (ref 6.0–73.0)
Testosterone: 816 ng/dL (ref 250–827)

## 2020-10-05 ENCOUNTER — Other Ambulatory Visit: Payer: Medicare Other

## 2020-10-10 ENCOUNTER — Other Ambulatory Visit: Payer: Self-pay | Admitting: Family Medicine

## 2020-10-10 DIAGNOSIS — I10 Essential (primary) hypertension: Secondary | ICD-10-CM

## 2020-10-12 NOTE — Telephone Encounter (Signed)
Patient is requesting a refill of the following medications: Requested Prescriptions   Pending Prescriptions Disp Refills   Testosterone 30 MG/ACT SOLN [Pharmacy Med Name: TESTOSTERONE 30 MG/1.5 ML PUMP] 180 mL 0    Sig: APPLY 1 PUMP UNDER EACH ARM EVERY MORNING AS DIRECTED    Date of patient request: 10/12/20 Last office visit: 07/09/20 Date of last refill: 2/18/2 Last refill amount: 163ml +0 Follow up time period per chart: 01/11/21

## 2020-10-19 ENCOUNTER — Other Ambulatory Visit: Payer: Self-pay | Admitting: Family Medicine

## 2020-10-22 ENCOUNTER — Ambulatory Visit (INDEPENDENT_AMBULATORY_CARE_PROVIDER_SITE_OTHER): Payer: Medicare Other | Admitting: Pharmacist

## 2020-10-22 DIAGNOSIS — R7989 Other specified abnormal findings of blood chemistry: Secondary | ICD-10-CM

## 2020-10-22 DIAGNOSIS — K219 Gastro-esophageal reflux disease without esophagitis: Secondary | ICD-10-CM

## 2020-10-22 DIAGNOSIS — I2510 Atherosclerotic heart disease of native coronary artery without angina pectoris: Secondary | ICD-10-CM | POA: Diagnosis not present

## 2020-10-22 DIAGNOSIS — E785 Hyperlipidemia, unspecified: Secondary | ICD-10-CM

## 2020-10-22 DIAGNOSIS — N400 Enlarged prostate without lower urinary tract symptoms: Secondary | ICD-10-CM | POA: Diagnosis not present

## 2020-10-22 DIAGNOSIS — F411 Generalized anxiety disorder: Secondary | ICD-10-CM

## 2020-10-22 DIAGNOSIS — K52832 Lymphocytic colitis: Secondary | ICD-10-CM

## 2020-10-22 DIAGNOSIS — I10 Essential (primary) hypertension: Secondary | ICD-10-CM | POA: Diagnosis not present

## 2020-10-22 DIAGNOSIS — I2583 Coronary atherosclerosis due to lipid rich plaque: Secondary | ICD-10-CM

## 2020-10-23 NOTE — Chronic Care Management (AMB) (Signed)
Chronic Care Management Pharmacy Note  10/23/2020 Name:  James [name] MRN:  [id number] DOB:  19-Aug-1940  Subjective: James [name] is an 80 y.o. year old male who is a primary patient of James Lukes, MD.  The CCM team was consulted for assistance with disease management and care coordination needs.    Engaged with patient by telephone for follow up visit in response to provider referral for pharmacy case management and/or care coordination services.   Consent to Services:  The patient was given information about Chronic Care Management services, agreed to services, and gave verbal consent prior to initiation of services.  Please see initial visit note for detailed documentation.   Patient Care Team: James Lukes, MD as PCP - General (Family Medicine) Hollar, Katharine Look, MD as Referring Physician (Dermatology) Nahser, Wonda Cheng, MD as Consulting Physician (Cardiology) Carol Ada, MD as Consulting Physician (Gastroenterology) James [name], PharmD (Pharmacist)  Recent office visits: 07/09/2020 PCP (Dr Charlett Blake) follow up on chronic medical concerns. Low testosterone. Slightly over treated . Drop dosing from 7 days a week to 5 days a week and recheck in 3 months.   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 F/u 6 months.  06/05/2020 - Cardio (Dr Johney Frame) f/u CAD (non obstructive) AAA and HDL. No medication changes.   Hospital visits: None in previous 6 months  Objective:  Lab Results  Component Value Date   CREATININE 0.98 09/21/2020   CREATININE 0.87 07/27/2020   CREATININE 1.21 (H) 07/09/2020    No results found for: HGBA1C Last diabetic Eye exam: No results found for: HMDIABEYEEXA  Last diabetic Foot exam: No results found for: HMDIABFOOTEX       Component Value Date/Time   CHOL 117 07/09/2020 1526   TRIG 65 07/09/2020 1526   HDL 57 07/09/2020 1526   CHOLHDL 2.1 07/09/2020 1526   VLDL 15.6 09/17/2019 1123   LDLCALC 46 07/09/2020 1526    Hepatic Function Latest Ref Rng & Units 09/21/2020 07/27/2020 07/09/2020  Total Protein 6.0 - 8.3 g/dL 7.0 6.3 6.4  Albumin 3.5 - 5.2 g/dL 4.3 3.8 -  AST 0 - 37 U/L 20 19 20   ALT 0 - 53 U/L 17 13 13   Alk Phosphatase 39 - 117 U/L 49 47 -  Total Bilirubin 0.2 - 1.2 mg/dL 0.6 0.6 0.7  Bilirubin, Direct 0.0 - 0.2 mg/dL - - -    Lab Results  Component Value Date/Time   TSH 0.98 07/09/2020 03:26 PM   TSH 0.60 09/17/2019 11:23 AM    CBC Latest Ref Rng & Units 07/09/2020 03/07/2020 10/11/2019  WBC 3.8 - 10.8 Thousand/uL 7.4 10.6(H) 6.1  Hemoglobin 13.2 - 17.1 g/dL 13.9 14.7 14.0  Hematocrit 38.5 - 50.0 % 39.4 42.7 40.5  Platelets 140 - 400 Thousand/uL 194 199 179.0    No results found for: VD25OH  Clinical ASCVD: Yes  The ASCVD Risk score James Bussing DC Jr., et al., 2013) failed to calculate for the following reasons:   The 2013 ASCVD risk score is only valid for ages 33 to 53     Social History   Tobacco Use  Smoking Status Never Smoker  Smokeless Tobacco Never Used   BP Readings from Last 3 Encounters:  07/09/20 110/78  07/09/20 110/78  06/05/20 140/80   Pulse Readings  from Last 3 Encounters:  07/09/20 (!) 47  07/09/20 (!) 47  06/05/20 (!) 51   Wt Readings from Last 3 Encounters:  07/09/20 154 lb (69.9 kg)  07/09/20 154 lb (69.9 kg)  06/05/20 155 lb 3.2 oz (70.4 kg)    Assessment: Review of patient past medical history, allergies, medications, health status, including review of consultants reports, laboratory and other test data, was performed as part of comprehensive evaluation and provision of chronic care management services.   SDOH:  (Social Determinants of Health) assessments and interventions performed:  SDOH Interventions   Flowsheet Row Most Recent Value  SDOH  Interventions   Financial Strain Interventions Intervention Not Indicated  Physical Activity Interventions Intervention Not Indicated      CCM Care Plan  Allergies  Allergen Reactions  . Sulfa Antibiotics Other (See Comments)    Childhood allergy    Medications Reviewed Today    Reviewed by James [name], PharmD (Pharmacist) on 10/22/20 at 74  Med List Status: <None>  Medication Order Taking? Sig Documenting Provider Last Dose Status Informant  aspirin EC 81 MG tablet [id number] Yes Take 81 mg by mouth. About once a five times a week [provider] Taking Active   atorvastatin (LIPITOR) 20 MG tablet [id number] Yes Take 1 tablet (20 mg total) by mouth daily. James Lukes, MD Taking Active   budesonide (ENTOCORT EC) 3 MG 24 hr capsule [id number] No Take 9 mg by mouth every morning.  Patient not taking: Reported on 10/22/2020   [provider] Not Taking Active   Calcium Carb-Cholecalciferol (CALCIUM 1000 + D PO) [id number] Yes Take 1 tablet by mouth 3 (three) times a week.  [provider] Taking Active Self  Cyanocobalamin (VITAMIN B 12) 500 MCG TABS [id number] Yes Take 1 tablet by mouth daily. [provider] Taking Active   Dutasteride-Tamsulosin HCl 0.5-0.4 MG CAPS [id number] Yes TAKE 1 CAPSULE BY MOUTH AT BEDTIME James Lukes, MD Taking Active   ELDERBERRY PO [id number] Yes Take 50 mg by mouth. 3 times per week [provider] Taking Active   levothyroxine (SYNTHROID) 100 MCG tablet [id number] Yes TAKE 1 TABLET BY MOUTH EVERY MORNING BEFORE BREAKFAST James Lukes, MD Taking Active            Med Note Antony Contras, [place] B   Thu Oct 22, 2020 12:07 PM) Taking 6 days per week.  LORazepam (ATIVAN) 0.5 MG tablet [id number] Yes TAKE 1 TABLET BY MOUTH 2 TIMES A DAY AS NEEDED FOR ANXIETY James Lukes, MD Taking Active   Melatonin 10 MG TABS [id number] Yes Take 1 tablet by mouth at bedtime as needed. [provider] Taking Active   Milk  Thistle 1000 MG CAPS [id number] Yes Take 1 capsule by mouth daily. [provider] Taking Active   mirtazapine (REMERON) 15 MG tablet [id number] Yes TAKE 1 TABLET BY MOUTH AT BEDTIME James Lukes, MD Taking Active   Multiple Vitamins-Minerals (MULTIVITAMINS THER. W/MINERALS) Sheral Flow [id number] Yes Take 1 tablet by mouth daily. [provider] Taking Active Self  pantoprazole (PROTONIX) 40 MG tablet [id number] Yes Take 1 tablet (40 mg total) by mouth 2 (two) times daily before a meal. James Lukes, MD Taking Active   Probiotic Product (PROBIOTIC DAILY PO) [id number] Yes Take by mouth. [provider] Taking Active   sildenafil (REVATIO) 20 MG tablet [id number] Yes TAKE 1 TO 5 TABLETS BY MOUTH EVERY DAY AS NEEDED James Lukes, MD Taking Active   Testosterone 30  MG/ACT Bailey Mech [id number] Yes APPLY 1 PUMP UNDER EACH ARM EVERY MORNING AS DIRECTED James Lukes, MD Taking Active   zolpidem (AMBIEN) 10 MG tablet [id number] Yes TAKE 1 TABLET BY MOUTH EVERY NIGHT AT BEDTIME AS NEEDED FOR SLEEP James Lukes, MD Taking Active           Patient Active Problem List   Diagnosis Date Noted  . Renal insufficiency 07/12/2020  . Skin lesion of left leg 09/17/2019  . Bilateral thoracic back pain 11/27/2016  . Essential hypertension 11/27/2016  . Abdominal bloating 05/19/2016  . BPH (benign prostatic hyperplasia) 08/14/2015  . Neck pain 05/03/2015  . 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 Anxiety state 05/18/2014  . Low testosterone 03/16/2014  . Insomnia 03/16/2014  . Preventative health care 03/16/2014  . Diverticulosis of colon without hemorrhage 03/16/2014  . Knee pain 10/19/2013  . AAA (abdominal aortic aneurysm) without rupture ([place]) 09/08/2013  . Erectile dysfunction 09/08/2013  . Benign paroxysmal positional vertigo 04/06/2013  . Chest pain 01/03/2012  . Hyperlipidemia 01/03/2012  . CAD (coronary artery disease) 01/03/2012  . Hypothyroidism 01/03/2012  .  GERD (gastroesophageal reflux disease) 01/03/2012  . History of atrial fibrillation   . Diverticulitis   . Mitral valve prolapse     Immunization History  Administered Date(s) Administered  . Fluad Quad(high Dose 65+) 07/09/2020  . Influenza Whole 03/20/2012  . Influenza, High Dose Seasonal PF 04/02/2013, 04/23/2015  . Influenza,inj,Quad PF,6+ Mos 03/11/2014, 04/08/2019  . Influenza-Unspecified 03/03/2016, 03/20/2018  . PFIZER(Purple Top)SARS-COV-2 Vaccination 07/06/2019, 07/27/2019, 04/24/2020  . Pneumococcal Conjugate-13 03/11/2014  . Pneumococcal Polysaccharide-23 04/18/2009  . Tdap 08/05/2009  . Zoster 09/10/2008  . Zoster Recombinat (Shingrix) 08/24/2020    Conditions to be addressed/monitored: CAD, HTN, HLD, Anxiety and insomnia; low testosterone; BPH; lylmphocytic colitis; GERD; AAA;   Care Plan : General Pharmacy (Adult)  Updates made by James [name], PHARMD since 10/23/2020 12:00 AM    Problem: Medication and Chronic Disease Management support, education, and care coordination needs related to HTN, Hyperlipidemia/CAD, Insomnia/Anxiety, Hypothyroidism, BPH, Colitis, low  testosterone   Note:   Current Barriers:  . Does not adhere to prescribed medication regimen . Chronic Disease Management support, education, and care coordination needs related to Hypertension, Hyperlipidemia/CAD, Depression/Anxiety, Hypothyroidism, BPH, Colitis, Insomnia  Pharmacist Clinical Goal(s):  Marland Kitchen Over the next 180 days, patient will adhere to prescribed medication regimen as evidenced by patient self reporting and  collaborating with PharmD and provider.   Interventions: . 1:1 collaboration with James Lukes, MD regarding development and update of comprehensive plan of care as evidenced by provider attestation and co-signature . Inter-disciplinary care team collaboration (see longitudinal plan of care) . Comprehensive medication review performed; medication list updated in electronic medical  record  Hypertension . BP goal <130/80; Controlled . Patient will compete in the [employer] in the 1 mile and 3 mile run in 2 weeks. He is doing great with daily physical activity and is adherent to low fat / low sodium diet.  . Current regimen:  o Diet and exercise management   . Interventions: o Discussed BP goal o Continue with current diet and exercise   Hyperlipidemia .  LDL goal < 70; At LDL goal . Current regimen:  . Atorvastatin 24m daily at bedtime . Aspirin 847mdaily daily 5 days per week (but patient has been taking daily) . Patient was unaware of recommendation to take aspirin 5 days per week. Discussed possible reasons why dose may have  been lowered - increased bruising, blood in stool (he has colitis) or low hemoglobin. Patient thinks may have been lowered due to incresaed bruising.  . Last Hgb was 13.9 on 07/09/2020 . Interventions: o Reviewed last lipid panel with patient and discussed lipid goals (his LDL and HDL are excellent!) o Change to take aspirin 3m 5 days per week.  o Maintain cholesterol medication regimen.   Anxiety/Insomnia . Reports sleeping well most nights . Current regimen:  . Lorazepam 0.540mtwice daily as needed (uses about once per week) . Mirtazapine 1513maily at bedtime . Zolpidem 75m60mily at bedtime (patient takes 1/2 tablet before bedtime and additional 1/2 tablet is wakes during night) . Melatonin 75mg69mly at bedtime . Interventions: o Discussed possibility of morning drowsiness with above medications o Continue to take medications as directed  Low Testosterone:  . Recent serum testosterone was 816 on 09/21/2020 - dose of topical testosterone was lowered to apply 5 days per week. Patient is aware and has made change.  . Last CBC showed Hgb = 13.9 and HCT = 39.4 (09/21/2020) . Last PSA was prior to starting testosterone and was 0.47 on 01/09/2018 . Current regimen:  . Testosterone topical - 30mg/52m - apply 1 pump  under each arm each morning 5 days per week  . Interventions: o Reviewed proper application of testosterone and ways to decreased transfer to others.  o Consider checking PSA with next labs in July 2022. Risk of prostate cancer is low with testosterone replacement but some still recommended 1 check of PSA after initiation of therapy.  o Continue testosterone at current dose  BPH:  . Patient reports symptoms of BPH - urinary hesitancy and frequency are managed with current therapy . Current regimen:  o Dutasteride - tamsulosin 0.5/0.4mg at61mdtime . Interventions:  o Recommended continue current therapy  Hypothyroidism:  . Last TSH was WNL on 07/09/2020 . Current regimen:  o Levothyroxine 100mcg d24m (per patient he is taking only 6 days per week) . Interventions:  o Continue current regimen o Recheck TSH in 6 to 12 months   Lymphocytic Colitis / diarrhea / GERD  . Monitored by GI - Dr Patrick Carol Adant regimen:  o Budesonide 3mg up t26mid (per patient he only takes as needed, about 1 or 2 times per week) o Pantoprazole 40mg twic13mday o Probiotic daily  . Intervention:  o Recommended continue current therapy and f/u with Dr Hung.   HeBenson Norway Maintenance  . Interventions: o Reminded he is due to receive second Shingrix any time o Recommended patient receive Td booster  Medication management . Current pharmacy: [facility]  . Interventions o Comprehensive medication review performed. o Continue current medication management strategy  Patient Goals/Self-Care Activities . Over the next 180 days, patient will:  take medications as prescribed and continue to exercise daily and follow low fat / low sodium diet  Follow Up Plan: Telephone follow up appointment with care management team member scheduled for:  6 months       Medication Assistance: None required.  Patient affirms current coverage meets needs.  [facility]'s [facility]:  [facility] [address] [phone] [id number] [id number] [place] - [id number] [place] [place] [address] [phone]:  Patient agrees to Care Plan and Follow-up.  Plan: Telephone  follow up appointment with care management team member scheduled for:  6 months  James [name], PharmD Clinical Pharmacist [place] [facility]

## 2020-10-23 NOTE — Patient Instructions (Signed)
Visit Information  PATIENT GOALS: Goals Addressed            This Visit's Progress   . Chronic Care Management Pharmacy Care Plan   On track    CARE PLAN ENTRY (see longitudinal plan of care for additional care plan information)  Current Barriers:  . Chronic Disease Management support, education, and care coordination needs related to Hypertension, Hyperlipidemia/CAD, Depression/Anxiety, Hypothyroidism, BPH, Colitis, Insomnia   Hypertension BP Readings from Last 3 Encounters:  07/09/20 110/78  07/09/20 110/78  06/05/20 140/80   . Pharmacist Clinical Goal(s): o Over the next 90 days, patient will work with PharmD and providers to maintain BP goal <130/80 . Current regimen:  o Diet and exercise management   . Interventions: o Discussed BP goal . Patient self care activities - Over the next 90 days, patient will: o Maintain BP goal less than 130/80  Hyperlipidemia Lab Results  Component Value Date/Time   LDLCALC 46 07/09/2020 03:26 PM   . Pharmacist Clinical Goal(s): o Over the next 90 days, patient will work with PharmD and providers to maintain LDL goal < 70 . Current regimen:  . Atorvastatin 20mg  daily at bedtime . Aspirin 81mg  daily daily 5 days per week . Patient self care activities - Over the next 90 days, patient will: o Maintain cholesterol medication regimen.   Anxiety/Insomnia . Pharmacist Clinical Goal(s) o Over the next 90 days, patient will work with PharmD and providers to reduce risk of symptoms associated with depression/anxiety . Current regimen:  . Lorazepam 0.5mg  twice daily as needed (uses about once per week) . Mirtazapine 15mg  daily at bedtime . Zolpidem 10mg  daily at bedtime (patient takes 1/2 tablet before bedtime and additional 1/2 tablet is wakes during night) . Melatonin 10mg  daily at bedtime . Interventions: o Discussed possibility of morning drowsiness with above medications . Patient self care activities - Over the next 90 days,  patient will: o Continue to take medications as directed  Low Testosterone:  . Pharmacist Clinical Goal(s) o Over the next 90 days, patient will work with PharmD and providers to reduce symptoms of low testosterone and monitor for treatment effectiveness. . Current regimen:  . Testosterone topical - 30mg /dose - apply 1 pump under each arm each morning 5 days per week  . Interventions: o Reviewed proper application of testosterone and ways to decreased transfer to others.  . Patient self care activities - Over the next 90 days, patient will: o Continue testosterone at current dose  Health Maintenance  . Pharmacist Clinical Goal(s) o Over the next 90 days, patient will work with PharmD and providers to complete health maintenance screenings/vaccinations . Interventions: o Due to receive second Shingrix any time o Recommended patient receive Td booster . Patient self care activities - Over the next 90 days, patient will: o Complete Shingrix vaccine series o Receive Td booster   Medication management . Pharmacist Clinical Goal(s): o Over the next 90 days, patient will work with PharmD and providers to maintain optimal medication adherence . Current pharmacy: [facility]  . Interventions o Comprehensive medication review performed. o Continue current medication management strategy . Patient self care activities - Over the next 90 days, patient will: o Focus on medication adherence by filling and taking medications appropriately  o Take medications as prescribed o Report any questions or concerns to PharmD and/or provider(s)  Please see past updates related to this goal by clicking on the "Past Updates" button in the selected goal  The patient verbalized understanding of instructions, educational materials, and care plan provided today and declined offer to receive copy of patient instructions, educational materials, and care plan.   Telephone follow up appointment  with care management team member scheduled for: 6 months  Cherre Robins, PharmD Clinical Pharmacist [facility] [phone]

## 2020-11-09 DIAGNOSIS — Z85828 Personal history of other malignant neoplasm of skin: Secondary | ICD-10-CM | POA: Diagnosis not present

## 2020-11-09 DIAGNOSIS — Z808 Family history of malignant neoplasm of other organs or systems: Secondary | ICD-10-CM | POA: Diagnosis not present

## 2020-11-09 DIAGNOSIS — L570 Actinic keratosis: Secondary | ICD-10-CM | POA: Diagnosis not present

## 2020-11-24 ENCOUNTER — Other Ambulatory Visit: Payer: Self-pay | Admitting: Family Medicine

## 2020-11-30 ENCOUNTER — Other Ambulatory Visit: Payer: Self-pay | Admitting: Family Medicine

## 2020-11-30 NOTE — Telephone Encounter (Signed)
Patient is requesting a refill of the following medications: Requested Prescriptions   Pending Prescriptions Disp Refills   zolpidem (AMBIEN) 10 MG tablet [Pharmacy Med Name: ZOLPIDEM TARTRATE 10 MG TAB] 30 tablet     Sig: TAKE 1 TABLET BY MOUTH EVERY NIGHT AT BEDTIME AS NEEDED FOR SLEEP    Date of patient request: 11/30/20 Last office visit: 07/09/20 Date of last refill: 09/03/20 Last refill amount: 30 + 2 Follow up time period per chart: 01/11/21

## 2020-12-15 ENCOUNTER — Other Ambulatory Visit: Payer: Self-pay | Admitting: Family Medicine

## 2020-12-15 DIAGNOSIS — I10 Essential (primary) hypertension: Secondary | ICD-10-CM

## 2020-12-15 NOTE — Telephone Encounter (Signed)
Requesting: testosterone Contract:  UDS: 07/09/20 Last Visit: 07/09/20 Next Visit: 01/11/21 Last Refill: 10/12/20  Please Advise

## 2020-12-29 ENCOUNTER — Telehealth: Payer: Self-pay | Admitting: *Deleted

## 2020-12-29 NOTE — Addendum Note (Signed)
Addended by: Kelle Darting A on: 12/29/2020 09:33 AM   Modules accepted: Orders

## 2020-12-29 NOTE — Telephone Encounter (Signed)
Caller Name [place] Phone Number [phone] Patient Name James [name] Patient DOB 1941-03-02 Call Type Message Only Information Provided Reason for Call Request to Reschedule Office Appointment Initial Comment Caller states he is calling to reschedule his appointment. Patient request to speak to RN No Disp. Time Disposition Final User 12/28/2020 9:08:31 PM General Information Provided Yes Peggye Fothergill

## 2020-12-29 NOTE — Telephone Encounter (Signed)
Pt is already rescheduled

## 2020-12-30 ENCOUNTER — Other Ambulatory Visit: Payer: Medicare Other

## 2020-12-30 DIAGNOSIS — U071 COVID-19: Secondary | ICD-10-CM | POA: Diagnosis not present

## 2021-01-04 ENCOUNTER — Other Ambulatory Visit: Payer: Medicare Other

## 2021-01-04 DIAGNOSIS — R7989 Other specified abnormal findings of blood chemistry: Secondary | ICD-10-CM | POA: Diagnosis not present

## 2021-01-07 LAB — TESTOS,TOTAL,FREE AND SHBG (FEMALE)
Free Testosterone: 86.8 pg/mL (ref 30.0–135.0)
Sex Hormone Binding: 47 nmol/L (ref 22–77)
Testosterone, Total, LC-MS-MS: 603 ng/dL (ref 250–1100)

## 2021-01-11 ENCOUNTER — Other Ambulatory Visit: Payer: Self-pay

## 2021-01-11 ENCOUNTER — Ambulatory Visit (INDEPENDENT_AMBULATORY_CARE_PROVIDER_SITE_OTHER): Payer: Medicare Other | Admitting: Family Medicine

## 2021-01-11 VITALS — BP 112/64 | HR 61 | Temp 98.0°F | Resp 16 | Ht 70.0 in | Wt 154.0 lb

## 2021-01-11 DIAGNOSIS — Z79899 Other long term (current) drug therapy: Secondary | ICD-10-CM

## 2021-01-11 DIAGNOSIS — R7989 Other specified abnormal findings of blood chemistry: Secondary | ICD-10-CM | POA: Diagnosis not present

## 2021-01-11 DIAGNOSIS — U071 COVID-19: Secondary | ICD-10-CM

## 2021-01-11 DIAGNOSIS — I10 Essential (primary) hypertension: Secondary | ICD-10-CM

## 2021-01-11 DIAGNOSIS — I2583 Coronary atherosclerosis due to lipid rich plaque: Secondary | ICD-10-CM

## 2021-01-11 DIAGNOSIS — E039 Hypothyroidism, unspecified: Secondary | ICD-10-CM

## 2021-01-11 DIAGNOSIS — E785 Hyperlipidemia, unspecified: Secondary | ICD-10-CM | POA: Diagnosis not present

## 2021-01-11 DIAGNOSIS — I2510 Atherosclerotic heart disease of native coronary artery without angina pectoris: Secondary | ICD-10-CM

## 2021-01-11 NOTE — Progress Notes (Signed)
Subjective:   By signing my name below, I, James [name], attest that this documentation has been prepared under the direction and in the presence of [name], MD 01/11/2021   Patient ID: James [name], male    DOB: [birth year], 80 y.o.   MRN: [id number]  Chief Complaint  Patient presents with   Follow-up   Hyperlipidemia   Hypothyroidism    HPI Patient is in today for an office visit.  He recently had Covid-19 2 weeks ago. He managed his symptoms with Paxlovid prescribed from the clinic and is doing well now. He had a bout of diarrhea during this time. He still experiences nausea, dizziness, headaches and is not back to his full strength. He denies having difficulty breathing while he had Covid-19. 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 He is willing to meet with a physical therapist to help him fix his balance. He has been managing his hyperlipidemia with 20 mg Lipitor PO daily and is doing well on it. He has 3 Covid-19 vaccines at this time. He is wiling to get the 2nd booster vaccine in 3 months.   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   Benign paroxysmal positional vertigo 04/06/2013   CAD (coronary artery disease)    nonobstructive by cath 2008; nonobstructive by Cardiac CT 11/2011   Cervical spine fracture ([place]) 1980   C6-7   Diverticulosis    Erectile dysfunction 09/08/2013   GERD (gastroesophageal reflux disease)    Hiatal hernia    History of atrial fibrillation    History of squamous cell carcinoma 10-05-10   Dr [name], [place] (hyperlipidemia)    Hypothyroidism    Medicare annual wellness visit, subsequent 03/16/2014   Sees [facility] Follows with Dr Johnsie Cancel of cardiology Sees  [facility] for colonoscopy, last colonoscopy in 2013    Mitral valve prolapse    Neck pain 05/03/2015   Preventative health care 03/16/2014   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   s/p excision   Syncope 02/03/2013   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    Past Surgical History:  Procedure Laterality Date   APPENDECTOMY  05/2011   CARDIAC CATHETERIZATION  01/03/12   30% ostial LAD stenosis, ostial 30-40% D1 stenosis, smooth and normal left main and RCA; LVEF 65%   CHOLECYSTECTOMY N/A 08/25/2014   Procedure: LAPAROSCOPIC CHOLECYSTECTOMY WITH INTRAOPERATIVE CHOLANGIOGRAM;  Surgeon: [name], MD;  Location: [facility];  Service: General;  Laterality: N/A;   INGUINAL HERNIA REPAIR  07/2010   right   KNEE ARTHROPLASTY     bilat   LEFT HEART CATHETERIZATION WITH CORONARY ANGIOGRAM N/A 01/03/2012   Procedure: LEFT HEART CATHETERIZATION WITH CORONARY ANGIOGRAM;  Surgeon: [name], MD;  Location[facility] C[facility] LAB;  Service: Cardiovascular;  Laterality: N/A;   MRI  10/31/2016   TONSILLECTOMY  1947   tumor removal  1958   "fatty tumor"    Family History  Problem Relation Age of Onset   Heart disease Mother        MI 71s   Stroke Mother    Hypertension Mother    Heart disease Maternal Grandfather        MI 17-80s  Heart disease Maternal Grandmother        MI 28-80s   Lung disease Father        penumonitis   Cancer Sister        colon   Cancer Paternal Uncle        GI: colon he thinks    Social History   Socioeconomic History   Marital status: Single    Spouse name: Not on file   Number of children: Not on file   Years of education: Not on file   Highest education level: Not on file  Occupational History   Occupation: Chief Financial Officer    Comment: Retired  Tobacco Use   Smoking status: Never   Smokeless tobacco: Never  Substance and Sexual Activity   Alcohol use: Yes    Comment: 2 glasses red wine/day   Drug use: No   Sexual activity: Yes  Other Topics Concern    Not on file  Social History Narrative   Not on file   Social Determinants of Health   Financial Resource Strain: Low Risk    Difficulty of Paying Living Expenses: Not hard at all  Food Insecurity: No Food Insecurity   Worried About Charity fundraiser in the Last Year: Never true   [place] in the Last Year: Never true  Transportation Needs: No Transportation Needs   Lack of Transportation (Medical): No   Lack of Transportation (Non-Medical): No  Physical Activity: Sufficiently Active   Days of Exercise per Week: 5 days   Minutes of Exercise per Session: 60 min  Stress: No Stress Concern Present   Feeling of Stress : Not at all  Social Connections: Moderately Integrated   Frequency of Communication with Friends and Family: More than three times a week   Frequency of Social Gatherings with Friends and Family: More than three times a week   Attends Religious Services: More than 4 times per year   Active Member of [employer] or Organizations: Yes   Attends Archivist Meetings: 1 to 4 times per year   Marital Status: Never married  Human resources officer Violence: Not At Risk   Fear of Current or Ex-Partner: No   Emotionally Abused: No   Physically Abused: No   Sexually Abused: No    Outpatient Medications Prior to Visit  Medication Sig Dispense Refill   aspirin EC 81 MG tablet Take 81 mg by mouth. About once a five times a week     atorvastatin (LIPITOR) 20 MG tablet Take 1 tablet (20 mg total) by mouth daily. 90 tablet 1   Calcium Carb-Cholecalciferol (CALCIUM 1000 + D PO) Take 1 tablet by mouth 3 (three) times a week.      Cyanocobalamin (VITAMIN B 12) 500 MCG TABS Take 1 tablet by mouth daily.     LORazepam (ATIVAN) 0.5 MG tablet TAKE 1 TABLET BY MOUTH 2 TIMES A DAY AS NEEDED FOR ANXIETY 30 tablet 2   Melatonin 10 MG TABS Take 1 tablet by mouth at bedtime as needed.     Milk Thistle 1000 MG CAPS Take 1 capsule by mouth daily.     mirtazapine (REMERON) 15 MG tablet TAKE  1 TABLET BY MOUTH AT BEDTIME 90 tablet 1   Multiple Vitamins-Minerals (MULTIVITAMINS THER. W/MINERALS) TABS Take 1 tablet by mouth daily.     pantoprazole (PROTONIX) 40 MG tablet Take 1 tablet (40 mg total) by mouth 2 (two) times daily before a meal. 180 tablet 3  Probiotic Product (PROBIOTIC DAILY PO) Take by mouth.     sildenafil (REVATIO) 20 MG tablet TAKE 1 TO 5 TABLETS BY MOUTH EVERY DAY AS NEEDED 35 tablet 3   Testosterone 30 MG/ACT SOLN APPLY 1 PUMP UNDER EACH ARM EVERY MORNING AS DIRECTED 180 mL 0   zolpidem (AMBIEN) 10 MG tablet TAKE 1 TABLET BY MOUTH EVERY NIGHT AT BEDTIME AS NEEDED FOR SLEEP 30 tablet 2   budesonide (ENTOCORT EC) 3 MG 24 hr capsule Take 9 mg by mouth every morning. (Patient not taking: Reported on 10/22/2020)     Dutasteride-Tamsulosin HCl 0.5-0.4 MG CAPS TAKE 1 CAPSULE BY MOUTH AT BEDTIME 90 capsule 1   ELDERBERRY PO Take 50 mg by mouth. 3 times per week     levothyroxine (SYNTHROID) 100 MCG tablet TAKE 1 TABLET BY MOUTH EVERY MORNING BEFORE BREAKFAST 90 tablet 1   No facility-administered medications prior to visit.    Allergies  Allergen Reactions   Sulfa Antibiotics Other (See Comments)    Childhood allergy    Review of Systems  Constitutional:  Positive for malaise/fatigue.  Gastrointestinal:  Positive for nausea.  Neurological:  Positive for dizziness and headaches.      Objective:    Physical Exam Constitutional:      General: He is not in acute distress. HENT:     Head: Normocephalic and atraumatic.     Right Ear: External ear normal.     Left Ear: External ear normal.  Eyes:     Conjunctiva/sclera: Conjunctivae normal.  Cardiovascular:     Rate and Rhythm: Normal rate and regular rhythm.     Heart sounds: Normal heart sounds. No murmur heard. Pulmonary:     Effort: No respiratory distress.     Breath sounds: Normal breath sounds.  Abdominal:     General: Bowel sounds are normal. There is no distension.     Palpations: Abdomen is soft.      Tenderness: There is no abdominal tenderness.  Musculoskeletal:     Cervical back: Neck supple.  Lymphadenopathy:     Cervical: No cervical adenopathy.  Skin:    General: Skin is warm and dry.  Neurological:     Mental Status: He is alert and oriented to person, place, and time.  Psychiatric:        Behavior: Behavior normal.    BP 112/64   Pulse 61   Temp 98 F (36.7 C)   Resp 16   Ht '5\' 10"'$  (1.778 m)   Wt 154 lb (69.9 kg)   SpO2 98%   BMI 22.10 kg/m  Wt Readings from Last 3 Encounters:  01/11/21 154 lb (69.9 kg)  07/09/20 154 lb (69.9 kg)  07/09/20 154 lb (69.9 kg)    Diabetic Foot Exam - Simple   No data filed    Lab Results  Component Value Date   WBC 7.4 07/09/2020   HGB 13.9 07/09/2020   HCT 39.4 07/09/2020   PLT 194 07/09/2020   GLUCOSE 77 09/21/2020   CHOL 117 07/09/2020   TRIG 65 07/09/2020   HDL 57 07/09/2020   LDLCALC 46 07/09/2020   ALT 17 09/21/2020   AST 20 09/21/2020   NA 137 09/21/2020   K 4.1 09/21/2020   CL 101 09/21/2020   CREATININE 0.98 09/21/2020   BUN 18 09/21/2020   CO2 28 09/21/2020   TSH 0.98 07/09/2020   PSA 0.47 01/09/2018   INR 1.09 08/21/2014    Lab Results  Component Value Date  TSH 0.98 07/09/2020   Lab Results  Component Value Date   WBC 7.4 07/09/2020   HGB 13.9 07/09/2020   HCT 39.4 07/09/2020   MCV 93.1 07/09/2020   PLT 194 07/09/2020   Lab Results  Component Value Date   NA 137 09/21/2020   K 4.1 09/21/2020   CO2 28 09/21/2020   GLUCOSE 77 09/21/2020   BUN 18 09/21/2020   CREATININE 0.98 09/21/2020   BILITOT 0.6 09/21/2020   ALKPHOS 49 09/21/2020   AST 20 09/21/2020   ALT 17 09/21/2020   PROT 7.0 09/21/2020   ALBUMIN 4.3 09/21/2020   CALCIUM 9.4 09/21/2020   ANIONGAP 11 03/07/2020   GFR 72.68 09/21/2020   Lab Results  Component Value Date   CHOL 117 07/09/2020   Lab Results  Component Value Date   HDL 57 07/09/2020   Lab Results  Component Value Date   LDLCALC 46 07/09/2020    Lab Results  Component Value Date   TRIG 65 07/09/2020   Lab Results  Component Value Date   CHOLHDL 2.1 07/09/2020   No results found for: HGBA1C     Assessment & Plan:   Problem List Items Addressed This Visit     Hyperlipidemia    Encourage heart healthy diet such as MIND or DASH diet, increase exercise, avoid trans fats, simple carbohydrates and processed foods, consider a krill or fish or flaxseed oil cap daily. Tolerating Atorvastatin       Relevant Orders   CBC with Differential/Platelet   Comprehensive metabolic panel   Lipid panel   Hypothyroidism    On Levothyroxine, continue to monitor       Relevant Orders   TSH   Low testosterone    Doing well on supplemental testosterone no changes       Relevant Orders   Testosterone   Essential hypertension   Relevant Orders   CBC with Differential/Platelet   Comprehensive metabolic panel   Lipid panel   COVID-19    He is recovering but he still notes fatigue, light headedness, weakness. He is trying to rest and hydrate well. He is encouraged to practice deep breathing and report if symptoms worsen       Other Visit Diagnoses     High risk medication use    -  Primary   Relevant Orders   DRUG MONITORING, PANEL 8 WITH CONFIRMATION, URINE (Completed)        No orders of the defined types were placed in this encounter.   Claude Manges, MD, personally preformed the services described in this documentation.  All medical record entries made by the scribe were at my direction and in my presence.  I have reviewed the chart and discharge instructions (if applicable) and agree that the record reflects my personal performance and is accurate and complete. 01/11/2021   I,James [name],acting as a scribe for Penni Homans, MD.,have documented all relevant documentation on the behalf of Penni Homans, MD,as directed by  Penni Homans, MD while in the presence of Penni Homans, MD.    Penni Homans, MD

## 2021-01-11 NOTE — Patient Instructions (Signed)
Symptoms of COVID-19 Watch for symptoms People with COVID-19 have had a wide range of symptoms reported - ranging from mild symptoms to severe illness. Symptoms may appear 2-14 days after exposure to the virus. Anyone can have mild to severe symptoms. People with these symptoms may have COVID-19: Fever or chills Cough Shortness of breath or difficulty breathing Fatigue Muscle or body aches Headache New loss of taste or smell Sore throat Congestion or runny nose Nausea or vomiting Diarrhea This list does not include all possible symptoms. CDC will continue to update this list as we learn more about COVID-19. Older adults and people who have severe underlying medical conditions like heart or lung disease or diabetes seem to be at higher risk for developing more serious complications 0000000 illness. When to seek emergency medical attention Look for emergency warning signs* for COVID-19. If someone is showing any of these signs, seek emergency medical care immediately: Trouble breathing Persistent pain or pressure in the chest New confusion Inability to wake or stay awake Pale, gray, or blue-colored skin, lips, or nail beds, depending on skin tone *This list is not all possible symptoms. Please call your medical provider forany other symptoms that are severe or concerning to you.  Call 911 or call ahead to your local emergency facility: Notify the operatorthat you are seeking care for someone who has or may have COVID-19. If you are sick Check symptoms with a Coronavirus Self-Checker Get tested What to do if you are sick Isolate if you are sick When to quarantine How to care for someone who is sick Difference between COVID-19 & flu Influenza (Flu) and COVID-19 are both contagious respiratory illnesses, but they are caused by different viruses. COVID-19 is caused by infection with a new coronavirus (called SARS-CoV-2), and flu is caused by infection with influenza viruses.  COVID-19  seems to spread more easily than flu and causes more serious illnesses in some people. It can also take longer before people show symptoms and people can be contagious for longer. More information about differences between fluand COVID-19 is available in the different sections below.  Because some of the symptoms of flu and COVID-19 are similar, it may be hard to tell the difference between them based on symptoms alone, and testingmay be needed to help confirm a diagnosis. While more is learned every day about COVID-19 and the virus that causes it, there is still a lot that is unknown . This page compares COVID-19 and flu,given the best available information to date. Centers for Disease Control and Prevention Content source: [employer]ization and [employer]piratory Diseases (NCIRD), Division of Viral Diseases 08/12/2019 This information is not intended to replace advice given to you by your health care provider. Make sure you discuss any questions you have with your healthcare provider. Document Revised: 07/24/2020 Document Reviewed: 07/24/2020 Elsevier Patient Education  [place].  Pulse oximeter, want oxygen in 90s   Take Multivitamin with minerals, selenium Vitamin D 1000-2000 IU daily Probiotic with lactobacillus and bifidophilus Asprin EC 81 mg daily Fish or flaxseed or krill oil cap daily Elderberry daily Aged or blackened garlic

## 2021-01-14 LAB — DRUG MONITORING, PANEL 8 WITH CONFIRMATION, URINE
6 Acetylmorphine: NEGATIVE ng/mL (ref <10)
Alcohol Metabolites: POSITIVE ng/mL — AB (ref <500)
Amphetamines: NEGATIVE ng/mL (ref <500)
Benzodiazepines: NEGATIVE ng/mL (ref <100)
Buprenorphine, Urine: NEGATIVE ng/mL (ref <5)
Cocaine Metabolite: NEGATIVE ng/mL (ref <150)
Creatinine: 68.9 mg/dL (ref >=20.0)
Ethyl Glucuronide (ETG): 3517 ng/mL — ABNORMAL HIGH (ref <500)
Ethyl Sulfate (ETS): 1374 ng/mL — ABNORMAL HIGH (ref <100)
MDMA: NEGATIVE ng/mL (ref <500)
Marijuana Metabolite: 14 ng/mL — ABNORMAL HIGH (ref <5)
Marijuana Metabolite: POSITIVE ng/mL — AB (ref <20)
Opiates: NEGATIVE ng/mL (ref <100)
Oxidant: NEGATIVE ug/mL (ref <200)
Oxycodone: NEGATIVE ng/mL (ref <100)
pH: 6.8 (ref 4.5–9.0)

## 2021-01-14 LAB — DM TEMPLATE

## 2021-01-17 DIAGNOSIS — U071 COVID-19: Secondary | ICD-10-CM | POA: Insufficient documentation

## 2021-01-17 NOTE — Assessment & Plan Note (Signed)
Encourage heart healthy diet such as MIND or DASH diet, increase exercise, avoid trans fats, simple carbohydrates and processed foods, consider a krill or fish or flaxseed oil cap daily. Tolerating Atorvastatin 

## 2021-01-17 NOTE — Assessment & Plan Note (Signed)
Doing well on supplemental testosterone no changes

## 2021-01-17 NOTE — Assessment & Plan Note (Signed)
On Levothyroxine, continue to monitor 

## 2021-01-17 NOTE — Assessment & Plan Note (Signed)
He is recovering but he still notes fatigue, light headedness, weakness. He is trying to rest and hydrate well. He is encouraged to practice deep breathing and report if symptoms worsen

## 2021-01-20 ENCOUNTER — Other Ambulatory Visit: Payer: Self-pay | Admitting: Family Medicine

## 2021-01-20 NOTE — Telephone Encounter (Signed)
Requesting: lorazepam Contract: 01/11/21 UDS: 01/11/21 Last Visit: 01/11/21 Next Visit: none Last Refill: 07/24/20  Please Advise

## 2021-01-22 ENCOUNTER — Other Ambulatory Visit: Payer: Self-pay | Admitting: Family Medicine

## 2021-01-22 DIAGNOSIS — I10 Essential (primary) hypertension: Secondary | ICD-10-CM

## 2021-01-22 NOTE — Telephone Encounter (Signed)
Requesting: Testosterone '30mg'$ /ACT SOLN Contract: UDS:07/09/2020 Last Visit: 01/11/2021 Next Visit: None  Last Refill: 12/15/20 152m  Please Advise

## 2021-02-26 ENCOUNTER — Other Ambulatory Visit: Payer: Self-pay | Admitting: Family Medicine

## 2021-02-26 NOTE — Telephone Encounter (Signed)
Requesting:ambien  10 mg Contract:01/11/21 UDS:01/11/21 Last Visit:01/11/21 Next Visit:unknown Last Refill:11/30/20  Please Advise

## 2021-03-02 ENCOUNTER — Telehealth: Payer: Self-pay | Admitting: Pharmacist

## 2021-03-02 NOTE — Chronic Care Management (AMB) (Signed)
    Chronic Care Management Pharmacy Assistant   Name: DEANTHONY DIZE  MRN: [id number] DOB: 1941-04-15  Reason for Encounter: Disease State General   Recent office visits:  01/11/21-Stacey A. Charlett Blake, MD (PCP) Seen for general follow up. Labs ordered. Follow up in 6 months.  Recent consult visits:  11/09/20-(Dermatology) Carlin Bullard Hollar. Notes not available.  Hospital visits:  None in previous 6 months  Medications: Outpatient Encounter Medications as of 03/02/2021  Medication Sig Note   aspirin EC 81 MG tablet Take 81 mg by mouth. About once a five times a week    atorvastatin (LIPITOR) 20 MG tablet Take 1 tablet (20 mg total) by mouth daily.    budesonide (ENTOCORT EC) 3 MG 24 hr capsule Take 9 mg by mouth every morning. (Patient not taking: Reported on 10/22/2020)    Calcium Carb-Cholecalciferol (CALCIUM 1000 + D PO) Take 1 tablet by mouth 3 (three) times a week.     Cyanocobalamin (VITAMIN B 12) 500 MCG TABS Take 1 tablet by mouth daily.    Dutasteride-Tamsulosin HCl 0.5-0.4 MG CAPS TAKE 1 CAPSULE BY MOUTH AT BEDTIME    ELDERBERRY PO Take 50 mg by mouth. 3 times per week    levothyroxine (SYNTHROID) 100 MCG tablet TAKE 1 TABLET BY MOUTH EVERY MORNING BEFORE BREAKFAST 10/22/2020: Taking 6 days per week.   LORazepam (ATIVAN) 0.5 MG tablet TAKE 1 TABLET BY MOUTH TWICE DAILY AS NEEDED FOR ANXIETY    Melatonin 10 MG TABS Take 1 tablet by mouth at bedtime as needed.    Milk Thistle 1000 MG CAPS Take 1 capsule by mouth daily.    mirtazapine (REMERON) 15 MG tablet TAKE 1 TABLET BY MOUTH AT BEDTIME    Multiple Vitamins-Minerals (MULTIVITAMINS THER. W/MINERALS) TABS Take 1 tablet by mouth daily.    pantoprazole (PROTONIX) 40 MG tablet Take 1 tablet (40 mg total) by mouth 2 (two) times daily before a meal.    Probiotic Product (PROBIOTIC DAILY PO) Take by mouth.    sildenafil (REVATIO) 20 MG tablet TAKE 1 TO 5 TABLETS BY MOUTH EVERY DAY AS NEEDED    Testosterone 30 MG/ACT SOLN APPLY 1 PUMP  UNDER EACH ARM EVERY MORNING AS DIRECTED    zolpidem (AMBIEN) 10 MG tablet TAKE 1 TABLET BY MOUTH EVERY NIGHT AT BEDTIME AS NEEDED FOR SLEEP    No facility-administered encounter medications on file as of 03/02/2021.    Unsuccessful out reach to complete assessment call 3x and have left 3 voicemail's to return phone call.   Star Rating Drugs: Atorvastatin 20 mg Last filled:12/15/20 90 DS  Myriam Elta [place], [place]

## 2021-03-17 ENCOUNTER — Other Ambulatory Visit: Payer: Self-pay | Admitting: Family Medicine

## 2021-03-31 ENCOUNTER — Telehealth: Payer: Self-pay

## 2021-03-31 NOTE — Telephone Encounter (Signed)
Prior auth was started 03/31/21 for zolpidem 10 mg. Key: [id number]  Prior Josem Kaufmann was approved

## 2021-04-08 ENCOUNTER — Telehealth: Payer: Self-pay

## 2021-04-08 NOTE — Chronic Care Management (AMB) (Signed)
Chronic Care Management Pharmacy Assistant   Name: James [name]  MRN: [id number] DOB: 1941/01/08   Reason for Encounter: Disease State General assessment     Recent office visits:  01/11/21 [name] MD - Seen for high risk medication use - Labs ordered - Follow up in 6 months - Per provider recommendation take: Multivitamin with minerals, selenium Vitamin D 1000-2000 IU daily Probiotic with lactobacillus and bifidophilus Asprin EC 81 mg daily Fish or flaxseed or krill oil cap daily Elderberry daily Aged or blackened garlic   Recent consult visits:  11/09/20 Dermatology - [facility] - Seen for Personal history of other malignant neoplasm of skin - No notes available   Hospital visits:  None in previous 6 months  Medications: Outpatient Encounter Medications as of 04/08/2021  Medication Sig Note   aspirin EC 81 MG tablet Take 81 mg by mouth. About once a five times a week    atorvastatin (LIPITOR) 20 MG tablet TAKE 1 TABLET BY MOUTH EVERY DAY AT 6PM FOR CHOLESTEROL    budesonide (ENTOCORT EC) 3 MG 24 hr capsule Take 9 mg by mouth every morning. (Patient not taking: Reported on 10/22/2020)    Calcium Carb-Cholecalciferol (CALCIUM 1000 + D PO) Take 1 tablet by mouth 3 (three) times a week.     Cyanocobalamin (VITAMIN B 12) 500 MCG TABS Take 1 tablet by mouth daily.    Dutasteride-Tamsulosin HCl 0.5-0.4 MG CAPS TAKE 1 CAPSULE BY MOUTH AT BEDTIME    ELDERBERRY PO Take 50 mg by mouth. 3 times per week    levothyroxine (SYNTHROID) 100 MCG tablet TAKE 1 TABLET BY MOUTH EVERY MORNING BEFORE BREAKFAST 10/22/2020: Taking 6 days per week.   LORazepam (ATIVAN) 0.5 MG tablet TAKE 1 TABLET BY MOUTH TWICE DAILY AS NEEDED FOR ANXIETY    Melatonin 10 MG TABS Take 1 tablet by mouth at bedtime as needed.    Milk Thistle 1000 MG CAPS Take 1 capsule by mouth daily.    mirtazapine (REMERON) 15 MG tablet TAKE 1 TABLET BY MOUTH AT BEDTIME    Multiple Vitamins-Minerals (MULTIVITAMINS  THER. W/MINERALS) TABS Take 1 tablet by mouth daily.    pantoprazole (PROTONIX) 40 MG tablet Take 1 tablet (40 mg total) by mouth 2 (two) times daily before a meal.    Probiotic Product (PROBIOTIC DAILY PO) Take by mouth.    sildenafil (REVATIO) 20 MG tablet TAKE 1 TO 5 TABLETS BY MOUTH EVERY DAY AS NEEDED    Testosterone 30 MG/ACT SOLN APPLY 1 PUMP UNDER EACH ARM EVERY MORNING AS DIRECTED    zolpidem (AMBIEN) 10 MG tablet TAKE 1 TABLET BY MOUTH EVERY NIGHT AT BEDTIME AS NEEDED FOR SLEEP    No facility-administered encounter medications on file as of 04/08/2021.    Have you had any problems recently with your health? Patient states that he has no concerns at this time.   Have you had any problems with your pharmacy? Patient states that he has no issues with his pharmacy.   What issues or side effects are you having with your medications? Patient has no concerns.   What would you like me to pass along to [place] for them to help you with?  Patient states that he appreciates the call and has no concerns at the moment.   What can we do to take care of you better?  N/a   Star Rating Drugs: atorvastatin (LIPITOR) 20 MG tablet - Last filled: 03/17/21 90 DS  Andee Poles, CMA

## 2021-04-26 ENCOUNTER — Ambulatory Visit (INDEPENDENT_AMBULATORY_CARE_PROVIDER_SITE_OTHER): Payer: Medicare Other | Admitting: Pharmacist

## 2021-04-26 DIAGNOSIS — I2583 Coronary atherosclerosis due to lipid rich plaque: Secondary | ICD-10-CM

## 2021-04-26 DIAGNOSIS — E039 Hypothyroidism, unspecified: Secondary | ICD-10-CM

## 2021-04-26 DIAGNOSIS — R7989 Other specified abnormal findings of blood chemistry: Secondary | ICD-10-CM

## 2021-04-26 DIAGNOSIS — F411 Generalized anxiety disorder: Secondary | ICD-10-CM

## 2021-04-26 DIAGNOSIS — E785 Hyperlipidemia, unspecified: Secondary | ICD-10-CM

## 2021-04-26 DIAGNOSIS — I2510 Atherosclerotic heart disease of native coronary artery without angina pectoris: Secondary | ICD-10-CM

## 2021-04-27 NOTE — Chronic Care Management (AMB) (Signed)
Chronic Care Management Pharmacy Note  04/27/2021 Name:  James [name] MRN:  [id number] DOB:  11/24/1940  Subjective: James [name] is an 80 y.o. year old male who is a primary patient of [name], MD.  The CCM team was consulted for assistance with disease management and care coordination needs.    Engaged with patient by telephone for follow up visit in response to provider referral for pharmacy case management and/or care coordination services.   Consent to Services:  The patient was given information about Chronic Care Management services, agreed to services, and gave verbal consent prior to initiation of services.  Please see initial visit note for detailed documentation.   Patient Care Team: [name], MD as PCP - General (Family Medicine) Hollar, Katharine Look, MD as Referring Physician (Dermatology) Nahser, Wonda Cheng, MD as Consulting Physician (Cardiology) Carol Ada, MD as Consulting Physician (Gastroenterology) Cherre Robins, RPH-CPP (Pharmacist)  Recent office visits:  01/11/2021 - Fam Med (Dr Charlett Blake) Follow up post COVID infection. No med changes. Labs checked.  07/09/2020 PCP (Dr Charlett Blake) follow up on chronic medical concerns. Low testosterone. Slightly over treated . Drop dosing from 7 days a week to 5 days a week and recheck in 3 months.   Recent consult visits: 11/09/2020 - Dermatology (Dr [facility])   Hospital visits: None in previous 6 months  Objective:  Lab Results  Component Value Date   CREATININE 0.98 09/21/2020   CREATININE 0.87 07/27/2020   CREATININE 1.21 (H) 07/09/2020    No results found for: HGBA1C Last diabetic Eye exam: No results found for: HMDIABEYEEXA  Last diabetic Foot exam: No results found for: HMDIABFOOTEX      Component Value Date/Time   CHOL 117 07/09/2020 1526   TRIG 65 07/09/2020 1526   HDL 57 07/09/2020 1526   CHOLHDL 2.1 07/09/2020 1526   VLDL 15.6 09/17/2019 1123   LDLCALC 46 07/09/2020 1526    Hepatic  Function Latest Ref Rng & Units 09/21/2020 07/27/2020 07/09/2020  Total Protein 6.0 - 8.3 g/dL 7.0 6.3 6.4  Albumin 3.5 - 5.2 g/dL 4.3 3.8 -  AST 0 - 37 U/L _0 ALT 0 - 53 U/L _1 Alk Phosphatase 39 - 117 U/L 49 47 -  Total Bilirubin 0.2 - 1.2 mg/dL 0.6 0.6 0.7  Bilirubin, Direct 0.0 - 0.2 mg/dL - - -    Lab Results  Component Value Date/Time   TSH 0.98 07/09/2020 03:26 PM   TSH 0.60 09/17/2019 11:23 AM    CBC Latest Ref Rng & Units 07/09/2020 03/07/2020 10/11/2019  WBC 3.8 - 10.8 Thousand/uL 7.4 10.6(H) 6.1  Hemoglobin 13.2 - 17.1 g/dL 13.9 14.7 14.0  Hematocrit 38.5 - 50.0 % 39.4 42.7 40.5  Platelets 140 - 400 Thousand/uL 194 199 179.0    No results found for: VD25OH  Clinical ASCVD: Yes  The ASCVD Risk score (Arnett DK, et al., 2019) failed to calculate for the following reasons:   The 2019 ASCVD risk score is only valid for ages 31 to 66     Social History   Tobacco Use  Smoking Status Never  Smokeless Tobacco Never   BP Readings from Last 3 Encounters:  01/11/21 112/64  07/09/20 110/78  07/09/20 110/78   Pulse Readings from Last 3 Encounters:  01/11/21 61  07/09/20 (!) 47  07/09/20 (!) 47   Wt Readings from Last 3 Encounters:  01/11/21 154 lb (69.9 kg)  07/09/20 154 lb (69.9  kg)  07/09/20 154 lb (69.9 kg)    Assessment: Review of patient past medical history, allergies, medications, health status, including review of consultants reports, laboratory and other test data, was performed as part of comprehensive evaluation and provision of chronic care management services.   SDOH:  (Social Determinants of Health) assessments and interventions performed:  SDOH Interventions    Flowsheet Row Most Recent Value  SDOH Interventions   Financial Strain Interventions Intervention Not Indicated  Physical Activity Interventions Intervention Not Indicated  Transportation Interventions Intervention Not Indicated       CCM Care Plan  Allergies  Allergen  Reactions   Sulfa Antibiotics Other (See Comments)    Childhood allergy    Medications Reviewed Today     Reviewed by Cherre Robins, RPH-CPP (Pharmacist) on 04/27/21 at 2126  Med List Status: <None>   Medication Order Taking? Sig Documenting Provider Last Dose Status Informant  aspirin EC 81 MG tablet [id number] Yes Take 81 mg by mouth. About once a five times a week [provider] Taking Active   atorvastatin (LIPITOR) 20 MG tablet [id number] Yes TAKE 1 TABLET BY MOUTH EVERY DAY AT 6PM FOR CHOLESTEROL [name], MD Taking Active   budesonide (ENTOCORT EC) 3 MG 24 hr capsule [id number] No Take 9 mg by mouth every morning.  Patient not taking: Reported on 04/26/2021   [provider] Not Taking Active   Calcium Carb-Cholecalciferol (CALCIUM 1000 + D PO) [id number] Yes Take 1 tablet by mouth 3 (three) times a week.  [provider] Taking Active Self  Cyanocobalamin (VITAMIN B 12) 500 MCG TABS [id number] Yes Take 1 tablet by mouth daily. [provider] Taking Active   Dutasteride-Tamsulosin HCl 0.5-0.4 MG CAPS [id number] Yes TAKE 1 CAPSULE BY MOUTH AT BEDTIME [name], MD Taking Active   ELDERBERRY PO [id number] Yes Take 50 mg by mouth. 3 times per week [provider] Taking Active   levothyroxine (SYNTHROID) 100 MCG tablet [id number] Yes TAKE 1 TABLET BY MOUTH EVERY MORNING BEFORE BREAKFAST [name], MD Taking Active            Med Note Antony Contras, [place] B   Thu Oct 22, 2020 12:07 PM) Taking 6 days per week.  LORazepam (ATIVAN) 0.5 MG tablet [id number] Yes TAKE 1 TABLET BY MOUTH TWICE DAILY AS NEEDED FOR ANXIETY [name], MD Taking Active   Melatonin 10 MG TABS [id number] Yes Take 1 tablet by mouth at bedtime as needed. [provider] Taking Active   Milk Thistle 1000 MG CAPS [id number] Yes Take 1 capsule by mouth daily. [provider] Taking Active   mirtazapine (REMERON) 15 MG tablet [id number] Yes TAKE 1 TABLET BY  MOUTH AT BEDTIME [name], MD Taking Active   Multiple Vitamins-Minerals (MULTIVITAMINS THER. W/MINERALS) Sheral Flow [id number] Yes Take 1 tablet by mouth daily. [provider] Taking Active Self  pantoprazole (PROTONIX) 40 MG tablet [id number] Yes Take 1 tablet (40 mg total) by mouth 2 (two) times daily before a meal. [name], MD Taking Active   Probiotic Product (PROBIOTIC DAILY PO) [id number] Yes Take by mouth. [provider] Taking Active   sildenafil (REVATIO) 20 MG tablet [id number] Yes TAKE 1 TO 5 TABLETS BY MOUTH EVERY DAY AS NEEDED [name], MD Taking Active   Testosterone 30 MG/ACT Bailey Mech [id number] Yes APPLY 1 PUMP UNDER EACH ARM EVERY MORNING AS DIRECTED [name], MD Taking Active   [place] [id number] Yes  Take 1 tablet by mouth daily. [provider] Taking Active            Med Note Antony Contras, Jenene Slicker Apr 26, 2021  1:47 PM) Using 5 days per week  zolpidem (AMBIEN) 10 MG tablet [id number] Yes TAKE 1 TABLET BY MOUTH EVERY NIGHT AT BEDTIME AS NEEDED FOR SLEEP  Patient taking differently: Take 2.5-5 mg by mouth at bedtime as needed.   [name], MD Taking Active             Patient Active Problem List   Diagnosis Date Noted   COVID-19 01/17/2021   Renal insufficiency 07/12/2020   Skin lesion of left leg 09/17/2019   Bilateral thoracic back pain 11/27/2016   Essential hypertension 11/27/2016   Abdominal bloating 05/19/2016   BPH (benign prostatic hyperplasia) 08/14/2015   Neck pain 05/03/2015   BCC (basal cell carcinoma of skin) 06/09/2014   Anxiety state 05/18/2014   Low testosterone 03/16/2014   Insomnia 03/16/2014   Preventative health care 03/16/2014   Diverticulosis of colon without hemorrhage 03/16/2014   Knee pain 10/19/2013   AAA (abdominal aortic aneurysm) without rupture 09/08/2013   Erectile dysfunction 09/08/2013   Benign paroxysmal positional vertigo 04/06/2013   Chest pain 01/03/2012    Hyperlipidemia 01/03/2012   CAD (coronary artery disease) 01/03/2012   Hypothyroidism 01/03/2012   GERD (gastroesophageal reflux disease) 01/03/2012   History of atrial fibrillation    Diverticulitis    Mitral valve prolapse     Immunization History  Administered Date(s) Administered   Fluad Quad(high Dose 65+) 07/09/2020   Influenza Whole 03/20/2012   Influenza, High Dose Seasonal PF 04/02/2013, 04/23/2015   Influenza,inj,Quad PF,6+ Mos 03/11/2014, 04/08/2019   Influenza-Unspecified 03/03/2016, 03/20/2018   PFIZER(Purple Top)SARS-COV-2 Vaccination 07/06/2019, 07/27/2019, 04/24/2020   Pneumococcal Conjugate-13 03/11/2014   Pneumococcal Polysaccharide-23 04/18/2009   Tdap 08/05/2009   Zoster Recombinat (Shingrix) 08/24/2020, 11/17/2020   Zoster, Live 09/10/2008    Conditions to be addressed/monitored: CAD, HTN, HLD, Anxiety and insomnia; low testosterone; BPH; lylmphocytic colitis; GERD; AAA;   Care Plan : General Pharmacy (Adult)  Updates made by Cherre Robins, RPH-CPP since 04/27/2021 12:00 AM     Problem: Medication and Chronic Disease Management support, education, and care coordination needs related to HTN, Hyperlipidemia/CAD, Insomnia/Anxiety, Hypothyroidism, BPH, Colitis, low  testosterone   Note:   Current Barriers:  Does not adhere to prescribed medication regimen Chronic Disease Management support, education, and care coordination needs related to Hypertension, Hyperlipidemia/CAD, Depression/Anxiety, Hypothyroidism, BPH, Colitis, Insomnia  Pharmacist Clinical Goal(s):  Over the next 180 days, patient will adhere to prescribed medication regimen as evidenced by patient self reporting and  collaborating with PharmD and provider.   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 He is doing great with daily physical activity and is adherent to low fat / low sodium diet.  Current regimen:  Diet and exercise management   Interventions: Discussed BP goal Continue with current diet and exercise  Hyperlipidemia  LDL goal < 70; At LDL goal Current regimen:  Atorvastatin 51m daily at bedtime Aspirin 845mdaily daily 5 days per week.  Last  Hgb was 13.9 on 07/09/2020 Interventions: Reviewed last lipid panel with patient and discussed lipid goals (his LDL and HDL are excellent!) Maintain cholesterol medication regimen.  Reminded patient to follow up with cardiologist - Dr Johney Frame  Anxiety/Insomnia Reports sleep has been variable recently Insurance formulary reviewed for sleep medication coverage:  Tier 1 medications eszopiclone / Lunesta (patient has tried in past but cannot remember why he stopped); doxepin / Silenor Tier 3 medications Belsomnra and Dayvigo (both are orexin receptor antagonist) Current regimen:  Lorazepam 0.31m twice daily as needed (uses about once per week) Mirtazapine 166mdaily at bedtime Zolpidem 1036maily at bedtime (patient takes 1/2 tablet before bedtime and additional 1/2 tablet is wakes during night) Melatonin 69m32mily at bedtime Interventions: Formulary review performed.  Discussed possibility of morning drowsiness with above medications Continue to take medications as directed  Low Testosterone:  Recent serum testosterone was 816 on 09/21/2020 - dose of topical testosterone was lowered to apply 5 days per week. Patient is aware and has made change.  Last CBC showed Hgb = 13.9 and HCT = 39.4 (09/21/2020) Last PSA was prior to starting testosterone and was 0.51 (checked 2016) and 0.47 on  01/09/2018. No change in PSA since initiation of testosterone therapy Current regimen:  Testosterone topical - 30mg74me - apply 1 pump under each arm each morning 5 days per week  Interventions: Reviewed proper application of testosterone and ways to decreased transfer to others.  Continue to monitor testosterone level and CBC every 3 to 6 months. Labs were ordered at last office visit but have not been drawn yet. Reminded patient to have labs drawn.  Continue testosterone at current dose  BPH:  Patient reports symptoms of BPH - urinary hesitancy and frequency are managed with current therapy Current regimen:  Dutasteride - tamsulosin 0.5/0.4mg a25medtime Interventions:  Recommended continue current therapy Reminded that refill is due  Hypothyroidism:  Last TSH was WNL on 07/09/2020 Current regimen:  Levothyroxine 100mcg 52my (per patient he is taking only 6 days per week) Interventions:  Continue current regimen Recheck TSH in 6 to 12 months   Lymphocytic Colitis / diarrhea / GERD  Monitored by GI - Dr PatrickCarol Adaes breakthrough reflux.  Colitis controlled currently Current regimen:  Budesonide 3mg up 67mtid (per patient he only takes as needed, about 1 or 2 times per week) Pantoprazole 40mg twi33m day Probiotic daily  Intervention:  Recommended continue current therapy and f/u with Dr Hung.   HBenson [place]h Maintenance  Reviewed vaccine records.  Patient has completed Shingrix series since last Chronic Care Management visit.  Interventions: Discussed COVID bivalent booster - patient declined.  Recommended patient receive Td booster (should have better coverage in 2023)   Medication management Current pharmacy: [facility]  Interventions Comprehensive medication review performed. Continue current medication management strategy  Patient Goals/Self-Care Activities Over the next 180 days, patient will:  take medications as prescribed and continue to exercise  daily and follow low fat / low sodium diet  Follow Up Plan: Telephone follow up appointment with care management team member scheduled for:  6 months       Medication Assistance: None required.  Patient affirms current coverage meets needs.  Patient's preferred pharmacy is:  [facility] [id number] [id number] [id number] - [id number] [facility] [place] [address] [phone]  [phone]   Follow Up:  Patient agrees to Care Plan and Follow-up.  Plan: Telephone follow up appointment with care management team member scheduled for:  6 months  Cherre Robins, PharmD Clinical Pharmacist [place] [facility]

## 2021-04-27 NOTE — Patient Instructions (Signed)
Hypertension BP Readings from Last 3 Encounters:  01/11/21 112/64  07/09/20 110/78  07/09/20 110/78   Pharmacist Clinical Goal(s): Over the next 90 days, patient will work with PharmD and providers to maintain BP goal <130/80 Current regimen:  Diet and exercise management   Interventions: Discussed blood pressure goal Patient self care activities - Over the next 90 days, patient will: Maintain blood pressure less than 130/80  Hyperlipidemia Lab Results  Component Value Date/Time   [facility] [place] 46 07/09/2020 03:26 PM   Pharmacist Clinical Goal(s): Over the next 90 days, patient will work with PharmD and providers to maintain LDL goal < 70 Current regimen:  Atorvastatin 20mg  daily at bedtime Aspirin 81mg  daily daily 5 days per week Patient self care activities - Over the next 90 days, patient will: Maintain cholesterol medication regimen.   Anxiety/Insomnia Pharmacist Clinical Goal(s) Over the next 90 days, patient will work with PharmD and providers to reduce risk of symptoms associated with depression/anxiety Current regimen:  Lorazepam 0.5mg  twice daily as needed (uses about once per week) Mirtazapine 15mg  daily at bedtime Zolpidem 10mg  daily at bedtime (patient takes 1/2 tablet before bedtime and additional 1/2 tablet is wakes during night) Melatonin 10mg  daily at bedtime Interventions: Discussed possibility of morning drowsiness with above medications Patient self care activities - Over the next 90 days, patient will: Continue to take medications as directed  Low Testosterone:  Pharmacist Clinical Goal(s) Over the next 90 days, patient will work with PharmD and providers to reduce symptoms of low testosterone and monitor for treatment effectiveness. Current regimen:  Testosterone topical - 30mg /dose - apply 1 pump under each arm each morning 5 days per week  Interventions: Reviewed proper application of testosterone and ways to decreased transfer to others.  Patient self  care activities - Over the next 90 days, patient will: Continue testosterone at current dose  Health Maintenance  Pharmacist Clinical Goal(s) Over the next 90 days, patient will work with PharmD and providers to complete health maintenance screenings/vaccinations Interventions: Recommended patient receive Tetanus booster Discussed COVID bivalent booster Patient self care activities - Over the next 90 days, patient will: Complete Shingrix vaccine series (done)  Receive Tetanus booster   Medication management Pharmacist Clinical Goal(s): Over the next 90 days, patient will work with PharmD and providers to maintain optimal medication adherence Current pharmacy: [facility]  Interventions Comprehensive medication review performed. Continue current medication management strategy Patient self care activities - Over the next 90 days, patient will: Focus on medication adherence by filling and taking medications appropriately  Take medications as prescribed Report any questions or concerns to PharmD and/or provider(s)  Patient verbalizes understanding of instructions provided today and agrees to view in [place].   Telephone follow up appointment with care management team member scheduled for: 3 to 6 months  Cherre Robins, PharmD Clinical Pharmacist [place] [facility]

## 2021-04-28 ENCOUNTER — Telehealth: Payer: Self-pay | Admitting: Family Medicine

## 2021-04-28 ENCOUNTER — Other Ambulatory Visit: Payer: Self-pay | Admitting: Family Medicine

## 2021-04-28 DIAGNOSIS — D485 Neoplasm of uncertain behavior of skin: Secondary | ICD-10-CM | POA: Diagnosis not present

## 2021-04-28 DIAGNOSIS — L570 Actinic keratosis: Secondary | ICD-10-CM | POA: Diagnosis not present

## 2021-04-28 DIAGNOSIS — L820 Inflamed seborrheic keratosis: Secondary | ICD-10-CM | POA: Diagnosis not present

## 2021-04-28 NOTE — Telephone Encounter (Signed)
Medication: zolpidem (AMBIEN) 10 MG tablet   Has the patient contacted their pharmacy? Yes.   (If no, request that the patient contact the pharmacy for the refill.) (If yes, when and what did the pharmacy advise?)  Preferred Pharmacy (with phone number or street name):  [facility], [place] - [address]  [place], [place] [address]  Phone:  [phone]  Fax:  [phone]  Agent: Please be advised that RX refills may take up to 3 business days. We ask that you follow-up with your pharmacy.

## 2021-04-29 NOTE — Telephone Encounter (Signed)
Medication sent.

## 2021-04-30 ENCOUNTER — Telehealth: Payer: Self-pay | Admitting: *Deleted

## 2021-04-30 NOTE — Telephone Encounter (Signed)
Pt is scheduled for lab appointment on 05/05/21.  Future lab orders say expected 07/14/20.  Upon chart review it appears that pt was due at that time for a cpe.  He is scheduled with wellness coach for wellness exam on 05/19/22. Due to insurance coverage / timeframe requirements, lab appt will need to be rescheduled for closer

## 2021-05-03 ENCOUNTER — Telehealth: Payer: Self-pay | Admitting: Family Medicine

## 2021-05-03 ENCOUNTER — Other Ambulatory Visit: Payer: Self-pay | Admitting: Family Medicine

## 2021-05-04 NOTE — Telephone Encounter (Signed)
Error

## 2021-05-04 NOTE — Telephone Encounter (Signed)
Spoke with pt and rescheduled lab appt for 07/15/21 at 8:30am for fasting labs prior to wellness exam on 07/19/21.

## 2021-05-05 ENCOUNTER — Other Ambulatory Visit: Payer: Medicare Other

## 2021-05-05 ENCOUNTER — Other Ambulatory Visit (HOSPITAL_BASED_OUTPATIENT_CLINIC_OR_DEPARTMENT_OTHER): Payer: Self-pay

## 2021-05-05 MED ORDER — INFLUENZA VAC A&B SA ADJ QUAD 0.5 ML IM PRSY
PREFILLED_SYRINGE | INTRAMUSCULAR | 0 refills | Status: DC
  Filled 2021-05-05: qty 0.5, 1d supply, fill #0

## 2021-05-18 ENCOUNTER — Other Ambulatory Visit: Payer: Self-pay | Admitting: Family Medicine

## 2021-05-18 DIAGNOSIS — I10 Essential (primary) hypertension: Secondary | ICD-10-CM

## 2021-05-19 ENCOUNTER — Telehealth: Payer: Self-pay | Admitting: *Deleted

## 2021-05-19 DIAGNOSIS — E039 Hypothyroidism, unspecified: Secondary | ICD-10-CM

## 2021-05-19 DIAGNOSIS — E785 Hyperlipidemia, unspecified: Secondary | ICD-10-CM

## 2021-05-19 DIAGNOSIS — I10 Essential (primary) hypertension: Secondary | ICD-10-CM | POA: Diagnosis not present

## 2021-05-19 DIAGNOSIS — R3911 Hesitancy of micturition: Secondary | ICD-10-CM | POA: Diagnosis not present

## 2021-05-19 DIAGNOSIS — N401 Enlarged prostate with lower urinary tract symptoms: Secondary | ICD-10-CM | POA: Diagnosis not present

## 2021-05-19 NOTE — Telephone Encounter (Signed)
Prior auth started via cover my meds.  Awaiting determination.  Key: [id number]

## 2021-05-19 NOTE — Telephone Encounter (Signed)
The Prior Authorization request has been approved for Testosterone 30MG /ACT TD SOLN. The authorization is valid from 04/19/2021 through 05/19/2022. A letter of explanation will also be mailed to the patient.

## 2021-06-01 ENCOUNTER — Other Ambulatory Visit: Payer: Self-pay | Admitting: Family Medicine

## 2021-06-01 NOTE — Telephone Encounter (Signed)
Requesting: zolpidem Contract: 01/11/21 UDS: 01/11/21 Last Visit: 01/11/21 Next Visit: 09/06/21 Last Refill: 04/29/21  Please Advise

## 2021-06-07 ENCOUNTER — Encounter: Payer: Self-pay | Admitting: Family Medicine

## 2021-06-07 ENCOUNTER — Ambulatory Visit: Payer: Self-pay

## 2021-06-07 ENCOUNTER — Ambulatory Visit (INDEPENDENT_AMBULATORY_CARE_PROVIDER_SITE_OTHER): Payer: Medicare Other | Admitting: Family Medicine

## 2021-06-07 VITALS — BP 112/70 | Ht 70.0 in | Wt 154.0 lb

## 2021-06-07 DIAGNOSIS — M25562 Pain in left knee: Secondary | ICD-10-CM

## 2021-06-07 DIAGNOSIS — M1712 Unilateral primary osteoarthritis, left knee: Secondary | ICD-10-CM | POA: Diagnosis not present

## 2021-06-07 MED ORDER — DICLOFENAC SODIUM 1 % EX GEL: 4.0000 g | Freq: Four times a day (QID) | CUTANEOUS | 11 refills | Status: AC

## 2021-06-07 NOTE — Progress Notes (Signed)
James MOHON - 80 y.o. male MRN [id number]  Date of birth: 03-07-1941  SUBJECTIVE:  Including CC & ROS.  No chief complaint on file.   James [name] is a 80 y.o. male that is presenting with left knee pain.  He is an active runner and is started to power walk.  Pain is occurring over the medial joint space.  He has a distant history of meniscectomy from years ago.  No inciting event.   Review of Systems See HPI   HISTORY: Past Medical, Surgical, Social, and Family History Reviewed & Updated per EMR.   Pertinent Historical Findings include:  Past Medical History:  Diagnosis Date   AAA (abdominal aortic aneurysm) without rupture 09/08/2013   Abdominal bloating 05/19/2016   Anxiety state 05/18/2014   Atrial fibrillation (HCC)    BCC (basal cell carcinoma of skin) 06/09/2014   Benign paroxysmal positional vertigo 04/06/2013   CAD (coronary artery disease)    nonobstructive by cath 2008; nonobstructive by Cardiac CT 11/2011   Cervical spine fracture ([place]) 1980   C6-7   Diverticulosis    Erectile dysfunction 09/08/2013   GERD (gastroesophageal reflux disease)    Hiatal hernia    History of atrial fibrillation    History of squamous cell carcinoma 10-05-10   Dr [name], [place] (hyperlipidemia)    Hypothyroidism    Medicare annual wellness visit, subsequent 03/16/2014   Sees [place] [facility] Follows with Dr Johnsie Cancel of cardiology Sees [facility] for colonoscopy, last colonoscopy in 2013    Mitral valve prolapse    Neck pain 05/03/2015   Preventative health care 03/16/2014   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   s/p excision   Syncope 02/03/2013   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    Past Surgical History:  Procedure Laterality Date   APPENDECTOMY  05/2011   CARDIAC CATHETERIZATION  01/03/12   30% ostial LAD stenosis, ostial 30-40% D1 stenosis, smooth and normal left main and RCA; LVEF 65%   CHOLECYSTECTOMY N/A 08/25/2014   Procedure: LAPAROSCOPIC  CHOLECYSTECTOMY WITH INTRAOPERATIVE CHOLANGIOGRAM;  Surgeon: [name], MD;  Location: [facility];  Service: General;  Laterality: N/A;   INGUINAL HERNIA REPAIR  07/2010   right   KNEE ARTHROPLASTY     bilat   LEFT HEART CATHETERIZATION WITH CORONARY ANGIOGRAM N/A 01/03/2012   Procedure: LEFT HEART CATHETERIZATION WITH CORONARY ANGIOGRAM;  Surgeon: [name], MD;  [facility];  Service: Cardiovascular;  Laterality: N/A;   MRI  10/31/2016   TONSILLECTOMY  1947   tumor removal  1958   "fatty tumor"    Family History  Problem Relation Age of Onset   Heart disease Mother        MI 49s   Stroke Mother    Hypertension Mother    Heart disease Maternal Grandfather        MI 65-80s   Heart disease Maternal Grandmother        MI 43-80s   Lung disease Father        penumonitis   Cancer Sister        colon   Cancer Paternal Uncle        GI: colon he thinks    Social History   Socioeconomic History   Marital status: Single    Spouse name: Not on file   Number of children: Not on file   Years of education: Not on file   Highest education level: Not on file  Occupational History   Occupation: Chief Financial Officer    Comment: Retired  Tobacco Use   Smoking status: Never   Smokeless tobacco: Never  Substance and Sexual Activity   Alcohol use: Yes    Comment: 2 glasses red wine/day   Drug use: No   Sexual activity: Yes  Other Topics Concern   Not on file  Social History Narrative   Not on file   Social Determinants of Health   Financial Resource Strain: Low Risk    Difficulty of Paying Living Expenses: Not very hard  Food Insecurity: No Food Insecurity   Worried About Charity fundraiser in the Last Year: Never true   Ran Out of Food in the Last Year: Never true  Transportation Needs: No Transportation Needs   Lack of Transportation (Medical): No   Lack of Transportation (Non-Medical): No  Physical Activity: Sufficiently Active   Days of Exercise per Week: 5 days    Minutes of Exercise per Session: 60 min  Stress: No Stress Concern Present   Feeling of Stress : Not at all  Social Connections: Moderately Integrated   Frequency of Communication with Friends and Family: More than three times a week   Frequency of Social Gatherings with Friends and Family: More than three times a week   Attends Religious Services: More than 4 times per year   Active Member of [employer] or Organizations: Yes   Attends Archivist Meetings: 1 to 4 times per year   Marital Status: Never married  Human resources officer Violence: Not At Risk   Fear of Current or Ex-Partner: No   Emotionally Abused: No   Physically Abused: No   Sexually Abused: No     PHYSICAL EXAM:  VS: BP 112/70 (BP Location: Left Arm, Patient Position: Sitting)    Ht 5\' 10"  (1.778 m)    Wt 154 lb (69.9 kg)    BMI 22.10 kg/m  Physical Exam Gen: NAD, alert, cooperative with exam, well-appearing   Limited ultrasound: Left knee:  No effusion suprapatellar pouch. Moderate to severe medial joint space narrowing with outpouching of the medial meniscus. Normal-appearing mild degenerative changes of the lateral meniscus.  Summary: Degenerative changes appreciated of the medial joint space  Ultrasound and interpretation by Clearance Coots, MD     ASSESSMENT & PLAN:   OA (osteoarthritis) of knee Symptoms are consistent with his degenerative changes.  Avid runner and walker.  Wants to get back to training and has a competition in July of next summer. -Counseled on home exercise therapy and supportive care. -Referral to physical therapy. -Pursue orthotics. -Voltaren. -Could consider injection or further imaging.

## 2021-06-07 NOTE — Patient Instructions (Signed)
Nice to meet you Please try ice  Please the exercises  Please try physical therapy   Please send me a message in MyChart with any questions or updates.  Please see me back to make orthotics .   --Dr. Raeford Razor

## 2021-06-08 NOTE — Assessment & Plan Note (Signed)
Symptoms are consistent with his degenerative changes.  Avid runner and walker.  Wants to get back to training and has a competition in July of next summer. -Counseled on home exercise therapy and supportive care. -Referral to physical therapy. -Pursue orthotics. -Voltaren. -Could consider injection or further imaging.

## 2021-06-10 DIAGNOSIS — M25562 Pain in left knee: Secondary | ICD-10-CM | POA: Diagnosis not present

## 2021-06-16 ENCOUNTER — Ambulatory Visit (INDEPENDENT_AMBULATORY_CARE_PROVIDER_SITE_OTHER): Payer: Medicare Other | Admitting: Family Medicine

## 2021-06-16 ENCOUNTER — Encounter: Payer: Self-pay | Admitting: Family Medicine

## 2021-06-16 DIAGNOSIS — M1712 Unilateral primary osteoarthritis, left knee: Secondary | ICD-10-CM

## 2021-06-16 DIAGNOSIS — M25562 Pain in left knee: Secondary | ICD-10-CM | POA: Diagnosis not present

## 2021-06-16 NOTE — Progress Notes (Signed)
James [name] - 80 y.o. male MRN [id number]  Date of birth: 1940-11-23  SUBJECTIVE:  Including CC & ROS.  No chief complaint on file.   James [name] is a 80 y.o. male that is here for orthotics.   Review of Systems See HPI   HISTORY: Past Medical, Surgical, Social, and Family History Reviewed & Updated per EMR.   Pertinent Historical Findings include:  Past Medical History:  Diagnosis Date   AAA (abdominal aortic aneurysm) without rupture 09/08/2013   Abdominal bloating 05/19/2016   Anxiety state 05/18/2014   Atrial fibrillation (HCC)    BCC (basal cell carcinoma of skin) 06/09/2014   Benign paroxysmal positional vertigo 04/06/2013   CAD (coronary artery disease)    nonobstructive by cath 2008; nonobstructive by Cardiac CT 11/2011   Cervical spine fracture ([place]) 1980   C6-7   Diverticulosis    Erectile dysfunction 09/08/2013   GERD (gastroesophageal reflux disease)    Hiatal hernia    History of atrial fibrillation    History of squamous cell carcinoma 10-05-10   Dr [name], [place] (hyperlipidemia)    Hypothyroidism    Medicare annual wellness visit, subsequent 03/16/2014   Sees [place] [facility] Follows with Dr Johnsie Cancel of cardiology Sees [facility] for colonoscopy, last colonoscopy in 2013    Mitral valve prolapse    Neck pain 05/03/2015   Preventative health care 03/16/2014   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   s/p excision   Syncope 02/03/2013   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    Past Surgical History:  Procedure Laterality Date   APPENDECTOMY  05/2011   CARDIAC CATHETERIZATION  01/03/12   30% ostial LAD stenosis, ostial 30-40% D1 stenosis, smooth and normal left main and RCA; LVEF 65%   CHOLECYSTECTOMY N/A 08/25/2014   Procedure: LAPAROSCOPIC CHOLECYSTECTOMY WITH INTRAOPERATIVE CHOLANGIOGRAM;  Surgeon: [name], MD;  Location: [facility];  Service: General;  Laterality: N/A;   INGUINAL HERNIA REPAIR  07/2010   right   KNEE ARTHROPLASTY      bilat   LEFT HEART CATHETERIZATION WITH CORONARY ANGIOGRAM N/A 01/03/2012   Procedure: LEFT HEART CATHETERIZATION WITH CORONARY ANGIOGRAM;  Surgeon: [name], MD;  Location: [facility] CATH LAB;  Service: Cardiovascular;  Laterality: N/A;   MRI  10/31/2016   TONSILLECTOMY  1947   tumor removal  1958   "fatty tumor"    Family History  Problem Relation Age of Onset   Heart disease Mother        MI 32s   Stroke Mother    Hypertension Mother    Heart disease Maternal Grandfather        MI 69-80s   Heart disease Maternal Grandmother        MI 10-80s   Lung disease Father        penumonitis   Cancer Sister        colon   Cancer Paternal Uncle        GI: colon he thinks    Social History   Socioeconomic History   Marital status: Single    Spouse name: Not on file   Number of children: Not on file   Years of education: Not on file   Highest education level: Not on file  Occupational History   Occupation: Chief Financial Officer    Comment: Retired  Tobacco Use   Smoking status: Never   Smokeless tobacco: Never  Substance and Sexual Activity   Alcohol use: Yes  Comment: 2 glasses red wine/day   Drug use: No   Sexual activity: Yes  Other Topics Concern   Not on file  Social History Narrative   Not on file   Social Determinants of Health   Financial Resource Strain: Low Risk    Difficulty of Paying Living Expenses: Not very hard  Food Insecurity: No Food Insecurity   Worried About Running Out of Food in the Last Year: Never true   Ran Out of Food in the Last Year: Never true  Transportation Needs: No Transportation Needs   Lack of Transportation (Medical): No   Lack of Transportation (Non-Medical): No  Physical Activity: Sufficiently Active   Days of Exercise per Week: 5 days   Minutes of Exercise per Session: 60 min  Stress: No Stress Concern Present   Feeling of Stress : Not at all  Social Connections: Moderately Integrated   Frequency of Communication with Friends and  Family: More than three times a week   Frequency of Social Gatherings with Friends and Family: More than three times a week   Attends Religious Services: More than 4 times per year   Active Member of [employer] or Organizations: Yes   Attends Archivist Meetings: 1 to 4 times per year   Marital Status: Never married  Human resources officer Violence: Not At Risk   Fear of Current or Ex-Partner: No   Emotionally Abused: No   Physically Abused: No   Sexually Abused: No     PHYSICAL EXAM:  VS: Ht 5\' 10"  (1.778 m)    Wt 154 lb (69.9 kg)    BMI 22.10 kg/m  Physical Exam Gen: NAD, alert, cooperative with exam, well-appearing   Patient was fitted for a standard, cushioned, semi-rigid orthotic. The orthotic was heated and afterward the patient stood on the orthotic blank positioned on the orthotic stand. The patient was positioned in subtalar neutral position and 10 degrees of ankle dorsiflexion in a weight bearing stance. After completion of molding, a stable base was applied to the orthotic blank. The blank was ground to a stable position for weight bearing. Size: 12 Pairs: 2 Base: Blue EVA Additional Posting and Padding: None The patient ambulated these, and they were very comfortable.    ASSESSMENT & PLAN:   OA (osteoarthritis) of knee Completed custom orthotics today.

## 2021-06-16 NOTE — Assessment & Plan Note (Signed)
Completed custom orthotics today.

## 2021-06-17 ENCOUNTER — Other Ambulatory Visit: Payer: Self-pay | Admitting: Family Medicine

## 2021-06-17 NOTE — Telephone Encounter (Signed)
[facility]  Requesting: lorazepam 0.5mg   Contract:01/11/2021 UDS:01/11/2021 Last Visit: 01/11/2021 Next Visit: 09/06/2021 Last Refill: 01/21/2021 #30 and 1RF  Please Advise

## 2021-06-18 DIAGNOSIS — M25562 Pain in left knee: Secondary | ICD-10-CM | POA: Diagnosis not present

## 2021-06-22 DIAGNOSIS — M25562 Pain in left knee: Secondary | ICD-10-CM | POA: Diagnosis not present

## 2021-06-24 DIAGNOSIS — M25562 Pain in left knee: Secondary | ICD-10-CM | POA: Diagnosis not present

## 2021-06-28 DIAGNOSIS — M25562 Pain in left knee: Secondary | ICD-10-CM | POA: Diagnosis not present

## 2021-07-01 DIAGNOSIS — M25562 Pain in left knee: Secondary | ICD-10-CM | POA: Diagnosis not present

## 2021-07-06 DIAGNOSIS — M25562 Pain in left knee: Secondary | ICD-10-CM | POA: Diagnosis not present

## 2021-07-08 DIAGNOSIS — M25562 Pain in left knee: Secondary | ICD-10-CM | POA: Diagnosis not present

## 2021-07-14 DIAGNOSIS — M25562 Pain in left knee: Secondary | ICD-10-CM | POA: Diagnosis not present

## 2021-07-15 ENCOUNTER — Ambulatory Visit: Payer: Medicare Other

## 2021-07-15 ENCOUNTER — Ambulatory Visit: Payer: Medicare Other | Admitting: Family Medicine

## 2021-07-15 ENCOUNTER — Other Ambulatory Visit (INDEPENDENT_AMBULATORY_CARE_PROVIDER_SITE_OTHER): Payer: Medicare Other

## 2021-07-15 DIAGNOSIS — I10 Essential (primary) hypertension: Secondary | ICD-10-CM | POA: Diagnosis not present

## 2021-07-15 DIAGNOSIS — E039 Hypothyroidism, unspecified: Secondary | ICD-10-CM | POA: Diagnosis not present

## 2021-07-15 DIAGNOSIS — R7989 Other specified abnormal findings of blood chemistry: Secondary | ICD-10-CM | POA: Diagnosis not present

## 2021-07-15 DIAGNOSIS — E785 Hyperlipidemia, unspecified: Secondary | ICD-10-CM

## 2021-07-15 LAB — CBC WITH DIFFERENTIAL/PLATELET
Basophils Absolute: 0 10*3/uL (ref 0.0–0.1)
Basophils Relative: 0.4 % (ref 0.0–3.0)
Eosinophils Absolute: 0.2 10*3/uL (ref 0.0–0.7)
Eosinophils Relative: 2.6 % (ref 0.0–5.0)
HCT: 40.9 % (ref 39.0–52.0)
Hemoglobin: 13.7 g/dL (ref 13.0–17.0)
Lymphocytes Relative: 45.9 % (ref 12.0–46.0)
Lymphs Abs: 3 10*3/uL (ref 0.7–4.0)
MCHC: 33.5 g/dL (ref 30.0–36.0)
MCV: 95.7 fl (ref 78.0–100.0)
Monocytes Absolute: 0.8 10*3/uL (ref 0.1–1.0)
Monocytes Relative: 12.1 % — ABNORMAL HIGH (ref 3.0–12.0)
Neutro Abs: 2.5 10*3/uL (ref 1.4–7.7)
Neutrophils Relative %: 39 % — ABNORMAL LOW (ref 43.0–77.0)
Platelets: 200 10*3/uL (ref 150.0–400.0)
RBC: 4.28 Mil/uL (ref 4.22–5.81)
RDW: 13.3 % (ref 11.5–15.5)
WBC: 6.5 10*3/uL (ref 4.0–10.5)

## 2021-07-15 LAB — COMPREHENSIVE METABOLIC PANEL
ALT: 14 U/L (ref 0–53)
AST: 21 U/L (ref 0–37)
Albumin: 3.9 g/dL (ref 3.5–5.2)
Alkaline Phosphatase: 54 U/L (ref 39–117)
BUN: 9 mg/dL (ref 6–23)
CO2: 29 mEq/L (ref 19–32)
Calcium: 9.1 mg/dL (ref 8.4–10.5)
Chloride: 100 mEq/L (ref 96–112)
Creatinine, Ser: 0.94 mg/dL (ref 0.40–1.50)
GFR: 75.97 mL/min (ref >60.00)
Glucose, Bld: 92 mg/dL (ref 70–99)
Potassium: 4 mEq/L (ref 3.5–5.1)
Sodium: 135 mEq/L (ref 135–145)
Total Bilirubin: 1.1 mg/dL (ref 0.2–1.2)
Total Protein: 6.6 g/dL (ref 6.0–8.3)

## 2021-07-15 LAB — TSH: TSH: 2.97 u[IU]/mL (ref 0.35–5.50)

## 2021-07-15 LAB — LIPID PANEL
Cholesterol: 125 mg/dL (ref 0–200)
HDL: 57.8 mg/dL (ref >39.00)
LDL Cholesterol: 53 mg/dL (ref 0–99)
NonHDL: 67.62
Total CHOL/HDL Ratio: 2
Triglycerides: 71 mg/dL (ref 0.0–149.0)
VLDL: 14.2 mg/dL (ref 0.0–40.0)

## 2021-07-15 LAB — TESTOSTERONE: Testosterone: 464.28 ng/dL (ref 300.00–890.00)

## 2021-07-19 ENCOUNTER — Ambulatory Visit: Payer: Medicare Other

## 2021-07-19 NOTE — Progress Notes (Deleted)
Subjective:   RORIK VESPA is a 81 y.o. male who presents for Medicare Annual/Subsequent preventive examination.  Review of Systems    ***       Objective:    There were no vitals filed for this visit. There is no height or weight on file to calculate BMI.  Advanced Directives 07/09/2020 06/29/2020 03/07/2020 06/27/2019 06/11/2018 06/05/2017 05/19/2016  Does Patient Have a Medical Advance Directive? No No No No No No No  Would patient like information on creating a medical advance directive? No - Patient declined No - Patient declined - No - Patient declined Yes (MAU/Ambulatory/Procedural Areas - Information given) Yes (MAU/Ambulatory/Procedural Areas - Information given) Yes (MAU/Ambulatory/Procedural Areas - Information given)  Pre-existing out of facility DNR order (yellow form or pink MOST form) - - - - - - -    Current Medications (verified) Outpatient Encounter Medications as of 07/19/2021  Medication Sig   LORazepam (ATIVAN) 0.5 MG tablet TAKE 1 TABLET BY MOUTH TWICE DAILY AS NEEDED FOR ANXIETY   zolpidem (AMBIEN) 10 MG tablet TAKE ONE TABLET BY MOUTH AT BEDTIME AS NEEDED FOR SLEEP   aspirin EC 81 MG tablet Take 81 mg by mouth. About once a five times a week   atorvastatin (LIPITOR) 20 MG tablet TAKE 1 TABLET BY MOUTH EVERY DAY AT 6PM FOR CHOLESTEROL   budesonide (ENTOCORT EC) 3 MG 24 hr capsule Take 9 mg by mouth every morning. (Patient not taking: Reported on 04/26/2021)   Calcium Carb-Cholecalciferol (CALCIUM 1000 + D PO) Take 1 tablet by mouth 3 (three) times a week.    Cyanocobalamin (VITAMIN B 12) 500 MCG TABS Take 1 tablet by mouth daily.   diclofenac Sodium (VOLTAREN) 1 % GEL Apply 4 g topically 4 (four) times daily. To affected joint.   Dutasteride-Tamsulosin HCl 0.5-0.4 MG CAPS TAKE 1 CAPSULE BY MOUTH AT BEDTIME   ELDERBERRY PO Take 50 mg by mouth. 3 times per week   influenza vaccine adjuvanted (FLUAD) 0.5 ML injection Inject into the muscle.   levothyroxine  (SYNTHROID) 100 MCG tablet TAKE 1 TABLET BY MOUTH EVERY MORNING BEFORE BREAKFAST   Melatonin 10 MG TABS Take 1 tablet by mouth at bedtime as needed.   Milk Thistle 1000 MG CAPS Take 1 capsule by mouth daily.   mirtazapine (REMERON) 15 MG tablet TAKE 1 TABLET BY MOUTH AT BEDTIME   Multiple Vitamins-Minerals (MULTIVITAMINS THER. W/MINERALS) TABS Take 1 tablet by mouth daily.   pantoprazole (PROTONIX) 40 MG tablet Take 1 tablet (40 mg total) by mouth 2 (two) times daily before a meal.   Probiotic Product (PROBIOTIC DAILY PO) Take by mouth.   sildenafil (REVATIO) 20 MG tablet TAKE 1 TO 5 TABLETS BY MOUTH EVERY DAY AS NEEDED   Testosterone 30 MG/ACT SOLN APPLY 1 PUMP UNDER EACH ARM EVERY MORNING AS DIRECTED   TURMERIC CURCUMIN PO Take 1 tablet by mouth daily.   No facility-administered encounter medications on file as of 07/19/2021.    Allergies (verified) Sulfa antibiotics   History: Past Medical History:  Diagnosis Date   AAA (abdominal aortic aneurysm) without rupture 09/08/2013   Abdominal bloating 05/19/2016   Anxiety state 05/18/2014   Atrial fibrillation (HCC)    BCC (basal cell carcinoma of skin) 06/09/2014   Benign paroxysmal positional vertigo 04/06/2013   CAD (coronary artery disease)    nonobstructive by cath 2008; nonobstructive by Cardiac CT 11/2011   Cervical spine fracture ([place]) 1980   C6-7   Diverticulosis  Erectile dysfunction 09/08/2013   GERD (gastroesophageal reflux disease)    Hiatal hernia    History of atrial fibrillation    History of squamous cell carcinoma 10-05-10   Dr [name], [place] (hyperlipidemia)    Hypothyroidism    Medicare annual wellness visit, subsequent 03/16/2014   Sees [place] [facility] Follows with Dr Johnsie Cancel of cardiology Sees [facility] for colonoscopy, last colonoscopy in 2013    Mitral valve prolapse    Neck pain 05/03/2015   Preventative health care 03/16/2014   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   s/p excision    Syncope 02/03/2013   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   Past Surgical History:  Procedure Laterality Date   APPENDECTOMY  05/2011   CARDIAC CATHETERIZATION  01/03/12   30% ostial LAD stenosis, ostial 30-40% D1 stenosis, smooth and normal left main and RCA; LVEF 65%   CHOLECYSTECTOMY N/A 08/25/2014   Procedure: LAPAROSCOPIC CHOLECYSTECTOMY WITH INTRAOPERATIVE CHOLANGIOGRAM;  Surgeon: [name], MD;  Location: [facility];  Service: General;  Laterality: N/A;   INGUINAL HERNIA REPAIR  07/2010   right   KNEE ARTHROPLASTY     bilat   LEFT HEART CATHETERIZATION WITH CORONARY ANGIOGRAM N/A 01/03/2012   Procedure: LEFT HEART CATHETERIZATION WITH CORONARY ANGIOGRAM;  Surgeon: [name], MD;  Location[facility] CATH LAB;  Service: Cardiovascular;  Laterality: N/A;   MRI  10/31/2016   TONSILLECTOMY  1947   tumor removal  1958   "fatty tumor"   Family History  Problem Relation Age of Onset   Heart disease Mother        MI 51s   Stroke Mother    Hypertension Mother    Heart disease Maternal Grandfather        MI 53-80s   Heart disease Maternal Grandmother        MI 50-80s   Lung disease Father        penumonitis   Cancer Sister        colon   Cancer Paternal Uncle        GI: colon he thinks   Social History   Socioeconomic History   Marital status: Single    Spouse name: Not on file   Number of children: Not on file   Years of education: Not on file   Highest education level: Not on file  Occupational History   Occupation: Chief Financial Officer    Comment: Retired  Tobacco Use   Smoking status: Never   Smokeless tobacco: Never  Substance and Sexual Activity   Alcohol use: Yes    Comment: 2 glasses red wine/day   Drug use: No   Sexual activity: Yes  Other Topics Concern   Not on file  Social History Narrative   Not on file   Social Determinants of Health   Financial Resource Strain: Low Risk    Difficulty of Paying Living Expenses: Not very hard  Food Insecurity: Not on file   Transportation Needs: No Transportation Needs   Lack of Transportation (Medical): No   Lack of Transportation (Non-Medical): No  Physical Activity: Sufficiently Active   Days of Exercise per Week: 5 days   Minutes of Exercise per Session: 60 min  Stress: Not on file  Social Connections: Not on file    Tobacco Counseling Counseling given: Not Answered   Clinical Intake:                 Diabetic?No  Activities of Daily Living No flowsheet data found.  Patient Care Team: [name], MD as PCP - General (Family Medicine) Hollar, Katharine Look, MD as Referring Physician (Dermatology) Nahser, Wonda Cheng, MD as Consulting Physician (Cardiology) Carol Ada, MD as Consulting Physician (Gastroenterology) Cherre Robins, [place] (Pharmacist)  Indicate any recent Medical Services you may have received from other than [facility] providers in the past year (date may be approximate).     Assessment:   This is a routine wellness examination for [employer].  Hearing/Vision screen No results found.  Dietary issues and exercise activities discussed:     Goals Addressed   None    Depression Screen PHQ 2/9 Scores 01/11/2021 07/09/2020 04/23/2020 01/20/2020 09/17/2019 06/11/2018 06/05/2017  PHQ - 2 Score 0 0 0 6 0 0 0  PHQ- 9 Score - - 0 13 2 - -    Fall Risk Fall Risk  01/11/2021 07/09/2020 06/27/2019 05/15/2019 06/11/2018  Falls in the past year? 0 0 0 0 0  Comment - - - Emmi Telephone Survey: data to providers prior to load -  Number falls in past yr: 0 0 - - -  Injury with Fall? 0 0 - - -  Risk for fall due to : No Fall Risks - - - -  Follow up Falls evaluation completed Falls prevention discussed Education provided;Falls prevention discussed - -    FALL RISK PREVENTION PERTAINING TO THE HOME:  Any stairs in or around the home? {YES/NO:21197} If so, are there any without handrails? {YES/NO:21197} Home free of loose throw rugs in walkways, pet beds, electrical  cords, etc? {YES/NO:21197} Adequate lighting in your home to reduce risk of falls? {YES/NO:21197}  ASSISTIVE DEVICES UTILIZED TO PREVENT FALLS:  Life alert? {YES/NO:21197} Use of a cane, walker or w/c? {YES/NO:21197} Grab bars in the bathroom? {YES/NO:21197} Shower chair or bench in shower? {YES/NO:21197} Elevated toilet seat or a handicapped toilet? {YES/NO:21197}  TIMED UP AND GO:  Was the test performed? {YES/NO:21197}.  Length of time to ambulate 10 feet: *** sec.   {Appearance of Gait:2101803}  Cognitive Function: MMSE - Mini Mental State Exam 06/05/2017 05/19/2016  Orientation to time 5 5  Orientation to Place 5 5  Registration 3 3  Attention/ Calculation 5 5  Recall 3 3  Language- name 2 objects 2 2  Language- repeat 1 1  Language- follow 3 step command 3 3  Language- read & follow direction 1 1  Write a sentence 1 1  Copy design 1 1  Total score 30 30        Immunizations Immunization History  Administered Date(s) Administered   Fluad Quad(high Dose 65+) 07/09/2020, 05/05/2021   Influenza Whole 03/20/2012   Influenza, High Dose Seasonal PF 04/02/2013, 04/23/2015   Influenza,inj,Quad PF,6+ Mos 03/11/2014, 04/08/2019   Influenza-Unspecified 03/03/2016, 03/20/2018   PFIZER(Purple Top)SARS-COV-2 Vaccination 07/06/2019, 07/27/2019, 04/24/2020   Pneumococcal Conjugate-13 03/11/2014   Pneumococcal Polysaccharide-23 04/18/2009   Tdap 08/05/2009   Zoster Recombinat (Shingrix) 08/24/2020, 11/17/2020   Zoster, Live 09/10/2008    TDAP status: Due, Education has been provided regarding the importance of this vaccine. Advised may receive this vaccine at local pharmacy or Health Dept. Aware to provide a copy of the vaccination record if obtained from local pharmacy or Health Dept. Verbalized acceptance and understanding.  Flu Vaccine status: Up to date  Pneumococcal vaccine status: Up to date  {Covid-19 vaccine status:2101808}  Qualifies for Shingles Vaccine? No    Zostavax completed Yes   Shingrix Completed?: Yes  Screening Tests Health Maintenance  Topic Date Due   TETANUS/TDAP  08/06/2019   COVID-19 Vaccine (4 - Booster for Pfizer series) 06/19/2020   Pneumonia Vaccine 53+ Years old  Completed   INFLUENZA VACCINE  Completed   Zoster Vaccines- Shingrix  Completed   HPV VACCINES  Aged Out    Health Maintenance  Health Maintenance Due  Topic Date Due   TETANUS/TDAP  08/06/2019   COVID-19 Vaccine (4 - Booster for Pfizer series) 06/19/2020    Colorectal cancer screening: No longer required.   Lung Cancer Screening: (Low Dose CT Chest recommended if Age 80-80 years, 30 pack-year currently smoking OR have quit w/in 15years.) does not qualify.     Additional Screening:  Hepatitis C Screening: does not qualify  Vision Screening: Recommended annual ophthalmology exams for early detection of glaucoma and other disorders of the eye. Is the patient up to date with their annual eye exam?  {YES/NO:21197} Who is the provider or what is the name of the office in which the patient attends annual eye exams? *** If pt is not established with a provider, would they like to be referred to a provider to establish care? {YES/NO:21197}.   Dental Screening: Recommended annual dental exams for proper oral hygiene  Community Resource Referral / Chronic Care Management: CRR required this visit?  {YES/NO:21197}  CCM required this visit?  {YES/NO:21197}     Plan:     I have personally reviewed and noted the following in the patients chart:   Medical and social history Use of alcohol, tobacco or illicit drugs  Current medications and supplements including opioid prescriptions. {Opioid [phone]} Functional ability and status Nutritional status Physical activity Advanced directives List of other physicians Hospitalizations, surgeries, and ER visits in previous 12 months Vitals Screenings to include cognitive, depression, and  falls Referrals and appointments  In addition, I have reviewed and discussed with patient certain preventive protocols, quality metrics, and best practice recommendations. A written personalized care plan for preventive services as well as general preventive health recommendations were provided to patient.     Marta Antu, LPN   [id number]/32/[id number]  Nurse Health Advisor  Nurse Notes: ***

## 2021-07-20 ENCOUNTER — Other Ambulatory Visit: Payer: Self-pay | Admitting: Family Medicine

## 2021-07-20 NOTE — Telephone Encounter (Signed)
Requesting: lorazepam 0.5mg   Contract: 01/11/2021 UDS: 01/11/2021 Last Visit: 01/11/2021 Next Visit: 09/06/2021 Last Refill: 06/17/2021 #30 and 0RF  Please Advise

## 2021-08-03 ENCOUNTER — Other Ambulatory Visit: Payer: Self-pay | Admitting: Pharmacist

## 2021-08-03 ENCOUNTER — Ambulatory Visit (INDEPENDENT_AMBULATORY_CARE_PROVIDER_SITE_OTHER): Payer: Medicare Other | Admitting: Pharmacist

## 2021-08-03 DIAGNOSIS — R7989 Other specified abnormal findings of blood chemistry: Secondary | ICD-10-CM

## 2021-08-03 DIAGNOSIS — E785 Hyperlipidemia, unspecified: Secondary | ICD-10-CM

## 2021-08-03 DIAGNOSIS — I10 Essential (primary) hypertension: Secondary | ICD-10-CM

## 2021-08-03 DIAGNOSIS — I2510 Atherosclerotic heart disease of native coronary artery without angina pectoris: Secondary | ICD-10-CM

## 2021-08-03 DIAGNOSIS — E039 Hypothyroidism, unspecified: Secondary | ICD-10-CM

## 2021-08-04 MED ORDER — ATORVASTATIN CALCIUM 20 MG PO TABS: 20.0000 mg | ORAL_TABLET | Freq: Every day | ORAL | 3 refills | Status: DC

## 2021-08-04 MED ORDER — TESTOSTERONE 30 MG/ACT TD SOLN: TRANSDERMAL | 0 refills | Status: DC

## 2021-08-04 MED ORDER — ZOLPIDEM TARTRATE 10 MG PO TABS: 10.0000 mg | ORAL_TABLET | Freq: Every evening | ORAL | 2 refills | Status: DC | PRN

## 2021-08-04 NOTE — Chronic Care Management (AMB) (Signed)
Chronic Care Management Pharmacy Note  08/04/2021 Name:  James [name] MRN:  [id number] DOB:  19-Jul-1940  Subjective: James [name] is an 81 y.o. year old male who is a primary patient of [name], MD.  The CCM team was consulted for assistance with disease management and care coordination needs.    Engaged with patient by telephone for follow up visit in response to provider referral for pharmacy case management and/or care coordination services.   Consent to Services:  The patient was given information about Chronic Care Management services, agreed to services, and gave verbal consent prior to initiation of services.  Please see initial visit note for detailed documentation.   Patient Care Team: [name], MD as PCP - General (Family Medicine) Hollar, Katharine Look, MD as Referring Physician (Dermatology) Nahser, Wonda Cheng, MD as Consulting Physician (Cardiology) Carol Ada, MD as Consulting Physician (Gastroenterology) Cherre Robins, RPH-CPP (Pharmacist)  Recent office visits: 01/11/2021 - Fam Med (Dr Charlett Blake) Follow up post COVID infection. No med changes. Labs checked.  07/09/2020 PCP (Dr Charlett Blake) follow up on chronic medical concerns. Low testosterone. Slightly over treated . Drop dosing from 7 days a week to 5 days a week and recheck in 3 months.   Recent consult visits: 11/09/2020 - Dermatology (Dr [facility])   Hospital visits: None in previous 6 months  Objective:  Lab Results  Component Value Date   CREATININE 0.94 07/15/2021   CREATININE 0.98 09/21/2020   CREATININE 0.87 07/27/2020    No results found for: HGBA1C Last diabetic Eye exam: No results found for: HMDIABEYEEXA  Last diabetic Foot exam: No results found for: HMDIABFOOTEX      Component Value Date/Time   CHOL 125 07/15/2021 0829   TRIG 71.0 07/15/2021 0829   HDL 57.80 07/15/2021 0829   CHOLHDL 2 07/15/2021 0829   VLDL 14.2 07/15/2021 0829   LDLCALC 53 07/15/2021 0829   LDLCALC 46  07/09/2020 1526    Hepatic Function Latest Ref Rng & Units 07/15/2021 09/21/2020 07/27/2020  Total Protein 6.0 - 8.3 g/dL 6.6 7.0 6.3  Albumin 3.5 - 5.2 g/dL 3.9 4.3 3.8  AST 0 - 37 U/L 21 20 19   ALT 0 - 53 U/L 14 17 13   Alk Phosphatase 39 - 117 U/L 54 49 47  Total Bilirubin 0.2 - 1.2 mg/dL 1.1 0.6 0.6  Bilirubin, Direct 0.0 - 0.2 mg/dL - - -    Lab Results  Component Value Date/Time   TSH 2.97 07/15/2021 08:29 AM   TSH 0.98 07/09/2020 03:26 PM    CBC Latest Ref Rng & Units 07/15/2021 07/09/2020 03/07/2020  WBC 4.0 - 10.5 K/uL 6.5 7.4 10.6(H)  Hemoglobin 13.0 - 17.0 g/dL 13.7 13.9 14.7  Hematocrit 39.0 - 52.0 % 40.9 39.4 42.7  Platelets 150.0 - 400.0 K/uL 200.0 194 199    No results found for: VD25OH  Clinical ASCVD: Yes  The ASCVD Risk score (Arnett DK, et al., 2019) failed to calculate for the following reasons:   The 2019 ASCVD risk score is only valid for ages 100 to 15     Social History   Tobacco Use  Smoking Status Never  Smokeless Tobacco Never   BP Readings from Last 3 Encounters:  06/07/21 112/70  01/11/21 112/64  07/09/20 110/78   Pulse Readings from Last 3 Encounters:  01/11/21 61  07/09/20 (!) 47  07/09/20 (!) 47   Wt Readings from Last 3 Encounters:  06/16/21 154 lb (69.9 kg)  06/07/21 154 lb (69.9 kg)  01/11/21 154 lb (69.9 kg)    Assessment: Review of patient past medical history, allergies, medications, health status, including review of consultants reports, laboratory and other test data, was performed as part of comprehensive evaluation and provision of chronic care management services.   SDOH:  (Social Determinants of Health) assessments and interventions performed:  SDOH Interventions    Flowsheet Row Most Recent Value  SDOH Interventions   Food Insecurity Interventions Intervention Not Indicated  Physical Activity Interventions Intervention Not Indicated       CCM Care Plan  Allergies  Allergen Reactions   Sulfa Antibiotics Other  (See Comments)    Childhood allergy    Medications Reviewed Today     Reviewed by Cherre Robins, RPH-CPP (Pharmacist) on 08/04/21 at 0527  Med List Status: <None>   Medication Order Taking? Sig Documenting Provider Last Dose Status Informant  aspirin EC 81 MG tablet [id number] Yes Take 81 mg by mouth. About once a five times a week [provider] Taking Active   atorvastatin (LIPITOR) 20 MG tablet [id number] Yes TAKE 1 TABLET BY MOUTH EVERY DAY AT 6PM FOR CHOLESTEROL [name], MD Taking Active   budesonide (ENTOCORT EC) 3 MG 24 hr capsule [id number] Yes Take 9 mg by mouth every morning. [provider] Taking Active   Calcium Carb-Cholecalciferol (CALCIUM 1000 + D PO) [id number] Yes Take 1 tablet by mouth 3 (three) times a week.  [provider] Taking Active Self  Cyanocobalamin (VITAMIN B 12) 500 MCG TABS [id number] Yes Take 1 tablet by mouth daily. [provider] Taking Active   diclofenac Sodium (VOLTAREN) 1 % GEL [id number] Yes Apply 4 g topically 4 (four) times daily. To affected joint. Rosemarie Ax, MD Taking Active   Dutasteride-Tamsulosin HCl 0.5-0.4 MG CAPS [id number] Yes TAKE 1 CAPSULE BY MOUTH AT BEDTIME [name], MD Taking Active   ELDERBERRY PO [id number] Yes Take 50 mg by mouth. 3 times per week [provider] Taking Active   levothyroxine (SYNTHROID) 100 MCG tablet [id number] Yes TAKE 1 TABLET BY MOUTH EVERY MORNING BEFORE BREAKFAST [name], MD Taking Active            Med Note ([place], [place] Aug 03, 2021  1:43 PM) Taking 6 days per week  LORazepam (ATIVAN) 0.5 MG tablet [id number] Yes TAKE 1 TABLET BY MOUTH TWICE DAILY AS NEEDED FOR ANXIETY [name], MD Taking Active   Melatonin 10 MG TABS [id number] Yes Take 1 tablet by mouth at bedtime as needed. [provider] Taking Active   Milk Thistle 1000 MG CAPS [id number] Yes Take 1 capsule by mouth daily. [provider] Taking Active    mirtazapine (REMERON) 15 MG tablet [id number] Yes TAKE 1 TABLET BY MOUTH AT BEDTIME [name], MD Taking Active   Multiple Vitamins-Minerals (MULTIVITAMINS THER. W/MINERALS) Sheral Flow [id number] Yes Take 1 tablet by mouth daily. [provider] Taking Active Self  pantoprazole (PROTONIX) 40 MG tablet [id number] Yes Take 1 tablet (40 mg total) by mouth 2 (two) times daily before a meal. [name], MD Taking Active   Probiotic Product (PROBIOTIC DAILY PO) [id number] Yes Take by mouth. [provider] Taking Active   sildenafil (REVATIO) 20 MG tablet [id number] Yes TAKE 1 TO 5 TABLETS BY MOUTH EVERY DAY AS NEEDED [name], MD Taking Active   Testosterone 30 MG/ACT SOLN [id number] Yes APPLY 1 PUMP UNDER EACH ARM EVERY  MORNING AS DIRECTED [name], MD Taking Active            Med Note ([place] Aug 03, 2021  1:43 PM) Taking 5 days per week  TURMERIC CURCUMIN PO [id number] Yes Take 1 tablet by mouth daily. 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   Anxiety state 05/18/2014   Low testosterone 03/16/2014   Insomnia 03/16/2014   Preventative health care 03/16/2014   Diverticulosis of colon without hemorrhage 03/16/2014   OA (osteoarthritis) of knee 10/19/2013   AAA (abdominal aortic aneurysm) without rupture 09/08/2013   Erectile dysfunction  09/08/2013   Benign paroxysmal positional vertigo 04/06/2013   Chest pain 01/03/2012   Hyperlipidemia 01/03/2012   CAD (coronary artery disease) 01/03/2012   Hypothyroidism 01/03/2012   GERD (gastroesophageal reflux disease) 01/03/2012   History of atrial fibrillation    Diverticulitis    Mitral valve prolapse     Immunization History  Administered Date(s) Administered   Fluad Quad(high Dose 65+) 07/09/2020, 05/05/2021   Influenza Whole 03/20/2012   Influenza, High Dose Seasonal PF 04/02/2013, 04/23/2015   Influenza,inj,Quad PF,6+ Mos 03/11/2014, 04/08/2019   Influenza-Unspecified 03/03/2016, 03/20/2018   PFIZER(Purple Top)SARS-COV-2 Vaccination 07/06/2019, 07/27/2019, 04/24/2020   Pneumococcal Conjugate-13 03/11/2014   Pneumococcal Polysaccharide-23 04/18/2009   Tdap 08/05/2009   Zoster Recombinat (Shingrix) 08/24/2020, 11/17/2020   Zoster, Live 09/10/2008    Conditions to be addressed/monitored: CAD, HTN, HLD, Anxiety and insomnia; low testosterone; BPH; lylmphocytic colitis; GERD; AAA;   Care Plan : General Pharmacy (Adult)  Updates made by Cherre Robins, RPH-CPP since 08/04/2021 12:00 AM     Problem: Medication and Chronic Disease Management support, education, and care coordination needs related to HTN, Hyperlipidemia/CAD, Insomnia/Anxiety, Hypothyroidism, BPH, Colitis, low  testosterone   Note:   Current Barriers:  Does not adhere to prescribed medication regimen - improving Chronic Disease Management support, education, and care coordination needs related to Hypertension, Hyperlipidemia/CAD, Depression/Anxiety, Hypothyroidism, BPH, Colitis, Insomnia  Pharmacist Clinical Goal(s):  Over the next 180 days, patient will adhere to prescribed medication regimen as evidenced by patient self reporting and  collaborating with PharmD and provider.   Interventions: 1:1 collaboration with [name], MD regarding development and update of comprehensive plan of care as  evidenced by provider attestation and co-signature Inter-disciplinary care team collaboration (see longitudinal plan of care) Comprehensive medication review performed; medication list updated in electronic medical record  Hypertension BP goal <130/80; Controlled Competes in the [employer] in the 1 mile and 3 mile run or speed walking events. He is doing great with daily physical activity and is adherent to low fat / low sodium diet.  Current regimen:  Diet and exercise management   Interventions: Discussed BP goal Continue  with current diet and exercise  Hyperlipidemia  LDL goal < 70; At LDL goal Current regimen:  Atorvastatin 52m daily at bedtime Aspirin 848mdaily daily 5 days per week.  Last Hgb was normal 07/15/2021 Interventions: Reviewed last lipid panel with patient and discussed lipid goals (his LDL and HDL are excellent!) Maintain cholesterol medication regimen.  Coordinated refill for atrovastatin  Anxiety/Insomnia Reports sleep has improved a little Insurance formulary reviewed for sleep medication coverage:  Tier 1 medications eszopiclone / Lunesta (patient has tried in past but cannot remember why he stopped); doxepin / Silenor Tier 3 medications Belsomnra and Dayvigo (both are orexin receptor antagonist) Current regimen:  Lorazepam 0.21m58mwice daily as needed (uses about once per week) Mirtazapine 121m44mily at bedtime Zolpidem 10mg7mly at bedtime (patient takes 1/2 tablet before bedtime and additional 1/2 tablet if wakes during night) Melatonin 10mg 61my at bedtime Interventions: Formulary review performed.  Discussed possibility of morning drowsiness with above medications Continue to take medications as directed Requested updated Rx for zolpidem from PCP per patient request  Low Testosterone:  Recent serum testosterone was 464 on 07/15/2021 - no change in topical testosterone dose  Last CBC showed Hgb and HCT were WNL PSA was prior to  starting testosterone and was 0.51 (checked 2016)   Lab Results  Component Value Date   PSA 0.47 01/09/2018   PSA 0.51 04/23/2015   PSA 0.62 03/11/2014  Current regimen:  Testosterone topical - 30mg/d29m- apply 1 pump under each arm each morning 5 days per week  Interventions: Reviewed proper application of testosterone and ways to decreased transfer to others.  Continue to monitor testosterone level and CBC every 6 months.  Continue testosterone at current dose Requested updated prescription for testosterone from PCP  BPH:  Patient reports symptoms of BPH - urinary hesitancy and frequency are managed with current therapy Current regimen:  Dutasteride - tamsulosin 0.5/0.4mg at 14mtime Interventions:  Recommended continue current therapy  Hypothyroidism:  Last TSH was WNL on 07/15/2021 Current regimen:  Levothyroxine 100mcg da3m(per patient he is taking only 6 days per week) Interventions:  Continue current regimen Recheck TSH in 6 to 12 months   Lymphocytic Colitis / diarrhea / GERD  Monitored by GI - Dr Patrick HCarol Ada breakthrough reflux.  Colitis controlled currently Current regimen:  Budesonide 3mg up to18md (per patient he only takes as needed, about 1 or 2 times per week) Pantoprazole 40mg twice37may Probiotic daily  Intervention:  Recommended continue current therapy and f/u with Dr Hung.   [place]aintenance  Reviewed vaccine records.  Interventions: Discussed COVID bivalent booster - patient declined.  Recommended patient receive Tetanus booster (should have better coverage in 2023)   Medication management Current pharmacy: [facility]  Interventions Comprehensive medication review performed. Reviewed refill history and adherence Continue current medication management strategy  Patient Goals/Self-Care Activities Over the next 180 days, patient will:  take medications as prescribed and continue to exercise daily and follow low fat / low  sodium diet  Follow Up Plan: Telephone follow up appointment with care management team member scheduled for:  6 months       Medication Assistance: None required.  Patient affirms current coverage meets needs.  [facility]'s [facility] [id number] - [id number] [place] [id number] [facility]   [place] Phone: [phone] Fax: [phone]    Follow Up:  Patient agrees to Care Plan and Follow-up.  Plan: Telephone follow up appointment with care management team member scheduled for:  6 months  Cherre Robins, PharmD Clinical Pharmacist [place] [facility]

## 2021-08-04 NOTE — Patient Instructions (Signed)
James [name] It was a pleasure speaking with you today.  I have attached a summary of our visit today and information about your health goals.   I have sent in refill for atorvastatin and sent requests to Dr Charlett Blake regarding refills for zolpidem (Ambien) and testosterone.  If you have any questions or concerns, please feel free to contact me either at the phone number below or with a MyChart message.   Keep up the good work!  Cherre Robins, PharmD Clinical Pharmacist [facility] [phone] (direct line)  [phone] (main office number)   CARE PLAN ENTRY (Updated 08/03/2021)   Hypertension BP Readings from Last 3 Encounters:  06/07/21 112/70  01/11/21 112/64  07/09/20 110/78   Pharmacist Clinical Goal(s): Over the next 90 days, patient will work with PharmD and providers to maintain BP goal <130/80 Current regimen:  Diet and exercise management   Interventions: Discussed blood pressure goal Patient self care activities - Over the next 90 days, patient will: Maintain blood pressure less than 130/80  Hyperlipidemia Lab Results  Component Value Date/Time   LDLCALC 53 07/15/2021 08:29 AM   LDLCALC 46 07/09/2020 03:26 PM   Pharmacist Clinical Goal(s): Over the next 90 days, patient will work with PharmD and providers to maintain LDL goal < 70 Current regimen:  Atorvastatin 20mg  daily at bedtime Aspirin 81mg  daily daily 5 days per week Patient self care activities - Over the next 90 days, patient will: Maintain cholesterol medication regimen.   Anxiety/Insomnia Pharmacist Clinical Goal(s) Over the next 90 days, patient will work with PharmD and providers to reduce risk of symptoms associated with depression/anxiety Current regimen:  Lorazepam 0.5mg  twice daily as needed (uses about once per week) Mirtazapine 15mg  daily at bedtime Zolpidem 10mg  daily at bedtime (patient takes 1/2 tablet before bedtime and additional 1/2 tablet is wakes  during night) Melatonin 10mg  daily at bedtime Interventions: Discussed possibility of morning drowsiness with above medications Patient self care activities - Over the next 90 days, patient will: Continue to take medications as directed  Low Testosterone:  Pharmacist Clinical Goal(s) Over the next 90 days, patient will work with PharmD and providers to reduce symptoms of low testosterone and monitor for treatment effectiveness. Current regimen:  Testosterone topical - 30mg /dose - apply 1 pump under each arm each morning 5 days per week  Interventions: Reviewed proper application of testosterone and ways to decreased transfer to others.  Patient self care activities - Over the next 90 days, patient will: Continue testosterone at current dose  Health Maintenance  Pharmacist Clinical Goal(s) Over the next 90 days, patient will work with PharmD and providers to complete health maintenance screenings/vaccinations Interventions: Recommended patient receive Tetanus booster Patient self care activities - Over the next 90 days, patient will: Receive Tetanus booster   Medication management Pharmacist Clinical Goal(s): Over the next 90 days, patient will work with PharmD and providers to maintain optimal medication adherence Current pharmacy: [facility]  Interventions Comprehensive medication review performed. Continue current medication management strategy Patient self care activities - Over the next 90 days, patient will: Focus on medication adherence by filling and taking medications appropriately  Take medications as prescribed Report any questions or concerns to PharmD and/or provider(s)  Patient Goals/Self-Care Activities Over the next 180 days, patient will:  Continue to take medications as directed Continue to exercise regularly with a goal of at least 150 minutes per week of moderate intensity exercise ([employer]!)  Patient verbalizes understanding of instructions  and  care plan provided today and agrees to view in [place]. Active MyChart status confirmed with patient.

## 2021-08-04 NOTE — Telephone Encounter (Signed)
Patient requested refills for zolpidem 10mg  qhs Pharmacy: [facility] Last RF: 07/29/2021 (no refill remaining for next refill per patient) Quantity: 30  testosterone gel / solution Pharmacy: [facility] Last FT: 05/19/2021 Quantity 148ml  Forwarded request to PCP for review and approval.

## 2021-08-16 ENCOUNTER — Ambulatory Visit: Payer: Medicare Other | Admitting: Family Medicine

## 2021-08-20 ENCOUNTER — Other Ambulatory Visit: Payer: Self-pay | Admitting: Family Medicine

## 2021-09-03 NOTE — Progress Notes (Deleted)
? ?Subjective:  ? ? Patient ID: James [name], male    DOB: Sep 30, 1940, 81 y.o.   MRN: [id number] ? ?No chief complaint on file. ? ? ?HPI ?Patient is in today for a follow up on chronic conditions. ? ?Past Medical History:  ?Diagnosis Date  ? AAA (abdominal aortic aneurysm) without rupture 09/08/2013  ? Abdominal bloating 05/19/2016  ? Anxiety state 05/18/2014  ? Atrial fibrillation ([place])   ? 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 Benign paroxysmal positional vertigo 04/06/2013  ? CAD (coronary artery disease)   ? nonobstructive by cath 2008; nonobstructive by Cardiac CT 11/2011  ? Cervical spine fracture ([place]) 1980  ? C6-7  ? Diverticulosis   ? Erectile dysfunction 09/08/2013  ? GERD (gastroesophageal reflux disease)   ? Hiatal hernia   ? History of atrial fibrillation   ? History of squamous cell carcinoma 10-05-10  ? Dr [name], [place]  ? HLD (hyperlipidemia)   ? Hypothyroidism   ? Medicare annual wellness visit, subsequent 03/16/2014  ? Sees [facility] Follows with Dr Johnsie Cancel of cardiology Sees [facility] for colonoscopy, last colonoscopy in 2013   ? Mitral valve prolapse   ? Neck pain 05/03/2015  ? Preventative health care 03/16/2014  ? 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 s/p excision  ? Syncope 02/03/2013  ? 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  ? ? ?Past Surgical History:  ?Procedure Laterality Date  ? APPENDECTOMY  05/2011  ? CARDIAC CATHETERIZATION  01/03/12  ? 30% ostial LAD stenosis, ostial 30-40% D1 stenosis, smooth and normal left main and RCA; LVEF 65%  ? CHOLECYSTECTOMY N/A 08/25/2014  ? Procedure: LAPAROSCOPIC CHOLECYSTECTOMY WITH INTRAOPERATIVE CHOLANGIOGRAM;  Surgeon: [name], MD;  Location: [facility];  Service: General;  Laterality: N/A;  ? INGUINAL HERNIA REPAIR  07/2010  ? right  ? KNEE ARTHROPLASTY    ? bilat  ? LEFT HEART CATHETERIZATION WITH CORONARY ANGIOGRAM N/A 01/03/2012  ? Procedure: LEFT HEART CATHETERIZATION WITH CORONARY ANGIOGRAM;  Surgeon: [name],  MD;  Location: [facility] CATH LAB;  Service: Cardiovascular;  Laterality: N/A;  ? MRI  10/31/2016  ? TONSILLECTOMY  1947  ? tumor removal  1958  ? "fatty tumor"  ? ? ?Family History  ?Problem Relation Age of Onset  ? Heart disease Mother   ?     MI 2s  ? Stroke Mother   ? Hypertension Mother   ? Heart disease Maternal Grandfather   ?     MI 70-80s  ? Heart disease Maternal Grandmother   ?     MI 70-80s  ? Lung disease Father   ?     penumonitis  ? Cancer Sister   ?     colon  ? Cancer Paternal Uncle   ?     GI: colon he thinks  ? ? ?Social History  ? ?Socioeconomic History  ? Marital status: Single  ?  Spouse name: Not on file  ? Number of children: Not on file  ? Years of education: Not on file  ? Highest education level: Not on file  ?Occupational History  ? 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 Smoking status: Never  ? Smokeless tobacco: Never  ?Substance and Sexual Activity  ? Alcohol use: Yes  ?  Comment: 2 glasses red wine/day  ? Drug use: No  ? Sexual activity: Yes  ?Other Topics Concern  ? Not on file  ?Social History Narrative  ?  Not on file  ? ?Social Determinants of Health  ? ?Financial Resource Strain: Low Risk   ? Difficulty of Paying Living Expenses: Not very hard  ?Food Insecurity: No Food Insecurity  ? Worried About Charity fundraiser in the Last Year: Never true  ? Ran Out of Food in the Last Year: Never true  ?Transportation Needs: No Transportation Needs  ? Lack of Transportation (Medical): No  ? Lack of Transportation (Non-Medical): No  ?Physical Activity: Sufficiently Active  ? Days of Exercise per Week: 5 days  ? Minutes of Exercise per Session: 60 min  ?Stress: Not on file  ?Social Connections: Not on file  ?Intimate Partner Violence: Not on file  ? ? ?Outpatient Medications Prior to Visit  ?Medication Sig Dispense Refill  ? aspirin EC 81 MG tablet Take 81 mg by mouth. About once a five times a week    ? atorvastatin (LIPITOR) 20 MG tablet Take 1 tablet (20 mg total) by mouth  daily. 90 tablet 3  ? budesonide (ENTOCORT EC) 3 MG 24 hr capsule Take 9 mg by mouth every morning.    ? Calcium Carb-Cholecalciferol (CALCIUM 1000 + D PO) Take 1 tablet by mouth 3 (three) times a week.     ? Cyanocobalamin (VITAMIN B 12) 500 MCG TABS Take 1 tablet by mouth daily.    ? diclofenac Sodium (VOLTAREN) 1 % GEL Apply 4 g topically 4 (four) times daily. To affected joint. 100 g 11  ? Dutasteride-Tamsulosin HCl 0.5-0.4 MG CAPS TAKE 1 CAPSULE BY MOUTH AT BEDTIME 90 capsule 1  ? ELDERBERRY PO Take 50 mg by mouth. 3 times per week    ? levothyroxine (SYNTHROID) 100 MCG tablet TAKE 1 TABLET BY MOUTH EVERY MORNING BEFORE BREAKFAST 90 tablet 1  ? LORazepam (ATIVAN) 0.5 MG tablet TAKE 1 TABLET BY MOUTH TWICE DAILY AS NEEDED FOR ANXIETY 30 tablet 1  ? Melatonin 10 MG TABS Take 1 tablet by mouth at bedtime as needed.    ? Milk Thistle 1000 MG CAPS Take 1 capsule by mouth daily.    ? mirtazapine (REMERON) 15 MG tablet TAKE 1 TABLET BY MOUTH AT BEDTIME 90 tablet 1  ? Multiple Vitamins-Minerals (MULTIVITAMINS THER. W/MINERALS) TABS Take 1 tablet by mouth daily.    ? pantoprazole (PROTONIX) 40 MG tablet TAKE 1 TABLET BY MOUTH 2 TIMES A DAY BEFORE MEALS FOR ACID REFLUX 180 tablet 3  ? Probiotic Product (PROBIOTIC DAILY PO) Take by mouth.    ? sildenafil (REVATIO) 20 MG tablet TAKE 1 TO 5 TABLETS BY MOUTH EVERY DAY AS NEEDED 35 tablet 3  ? Testosterone 30 MG/ACT SOLN Apply 1 pump under each arm every morning as directed 180 mL 0  ? TURMERIC CURCUMIN PO Take 1 tablet by mouth daily.    ? zolpidem (AMBIEN) 10 MG tablet Take 1 tablet (10 mg total) by mouth at bedtime as needed. for sleep 30 tablet 2  ? ?No facility-administered medications prior to visit.  ? ? ?Allergies  ?Allergen Reactions  ? Sulfa Antibiotics Other (See Comments)  ?  Childhood allergy  ? ? ?ROS ? ?   ?Objective:  ?  ?Physical Exam ? ?There were no vitals taken for this visit. ?Wt Readings from Last 3 Encounters:  ?06/16/21 154 lb (69.9 kg)  ?06/07/21 154  lb (69.9 kg)  ?01/11/21 154 lb (69.9 kg)  ? ? ?Diabetic Foot Exam - Simple   ?No data filed ?  ? ?Lab Results  ?Component Value  Date  ? WBC 6.5 07/15/2021  ? HGB 13.7 07/15/2021  ? HCT 40.9 07/15/2021  ? PLT 200.0 07/15/2021  ? GLUCOSE 92 07/15/2021  ? CHOL 125 07/15/2021  ? TRIG 71.0 07/15/2021  ? HDL 57.80 07/15/2021  ? [place] 53 07/15/2021  ? ALT 14 07/15/2021  ? AST 21 07/15/2021  ? NA 135 07/15/2021  ? K 4.0 07/15/2021  ? CL 100 07/15/2021  ? CREATININE 0.94 07/15/2021  ? BUN 9 07/15/2021  ? CO2 29 07/15/2021  ? TSH 2.97 07/15/2021  ? PSA 0.47 01/09/2018  ? INR 1.09 08/21/2014  ? ? ?Lab Results  ?Component Value Date  ? TSH 2.97 07/15/2021  ? ?Lab Results  ?Component Value Date  ? WBC 6.5 07/15/2021  ? HGB 13.7 07/15/2021  ? HCT 40.9 07/15/2021  ? MCV 95.7 07/15/2021  ? PLT 200.0 07/15/2021  ? ?Lab Results  ?Component Value Date  ? NA 135 07/15/2021  ? K 4.0 07/15/2021  ? CO2 29 07/15/2021  ? GLUCOSE 92 07/15/2021  ? BUN 9 07/15/2021  ? CREATININE 0.94 07/15/2021  ? BILITOT 1.1 07/15/2021  ? ALKPHOS 54 07/15/2021  ? AST 21 07/15/2021  ? ALT 14 07/15/2021  ? PROT 6.6 07/15/2021  ? ALBUMIN 3.9 07/15/2021  ? CALCIUM 9.1 07/15/2021  ? ANIONGAP 11 03/07/2020  ? GFR 75.97 07/15/2021  ? ?Lab Results  ?Component Value Date  ? CHOL 125 07/15/2021  ? ?Lab Results  ?Component Value Date  ? HDL 57.80 07/15/2021  ? ?Lab Results  ?Component Value Date  ? [place] 53 07/15/2021  ? ?Lab Results  ?Component Value Date  ? TRIG 71.0 07/15/2021  ? ?Lab Results  ?Component Value Date  ? CHOLHDL 2 07/15/2021  ? ?No results found for: HGBA1C ? ?   ?Assessment & Plan:  ? ?Problem List Items Addressed This Visit   ?None ? ? ?I am having Pandora Leiter. [place] maintain his multivitamins ther. w/minerals, Calcium Carb-Cholecalciferol (CALCIUM 1000 + D PO), Probiotic Product (PROBIOTIC DAILY PO), aspirin EC, Milk Thistle, ELDERBERRY PO, Vitamin B 12, Melatonin, budesonide, TURMERIC CURCUMIN PO, diclofenac Sodium, sildenafil, LORazepam,  Dutasteride-Tamsulosin HCl, levothyroxine, mirtazapine, zolpidem, Testosterone, atorvastatin, and pantoprazole. ? ?No orders of the defined types were placed in this encounter. ? ? ? ? ?

## 2021-09-06 ENCOUNTER — Ambulatory Visit (INDEPENDENT_AMBULATORY_CARE_PROVIDER_SITE_OTHER): Payer: Medicare Other | Admitting: Family Medicine

## 2021-09-06 ENCOUNTER — Encounter: Payer: Self-pay | Admitting: Family Medicine

## 2021-09-06 VITALS — BP 135/80 | HR 47 | Resp 20 | Ht 70.0 in | Wt 156.2 lb

## 2021-09-06 DIAGNOSIS — N289 Disorder of kidney and ureter, unspecified: Secondary | ICD-10-CM | POA: Diagnosis not present

## 2021-09-06 DIAGNOSIS — I2510 Atherosclerotic heart disease of native coronary artery without angina pectoris: Secondary | ICD-10-CM

## 2021-09-06 DIAGNOSIS — Z79899 Other long term (current) drug therapy: Secondary | ICD-10-CM | POA: Diagnosis not present

## 2021-09-06 DIAGNOSIS — E039 Hypothyroidism, unspecified: Secondary | ICD-10-CM

## 2021-09-06 DIAGNOSIS — E785 Hyperlipidemia, unspecified: Secondary | ICD-10-CM | POA: Diagnosis not present

## 2021-09-06 DIAGNOSIS — R2681 Unsteadiness on feet: Secondary | ICD-10-CM

## 2021-09-06 DIAGNOSIS — Z0000 Encounter for general adult medical examination without abnormal findings: Secondary | ICD-10-CM

## 2021-09-06 DIAGNOSIS — R351 Nocturia: Secondary | ICD-10-CM | POA: Diagnosis not present

## 2021-09-06 DIAGNOSIS — I2583 Coronary atherosclerosis due to lipid rich plaque: Secondary | ICD-10-CM

## 2021-09-06 DIAGNOSIS — R7989 Other specified abnormal findings of blood chemistry: Secondary | ICD-10-CM | POA: Diagnosis not present

## 2021-09-06 DIAGNOSIS — I10 Essential (primary) hypertension: Secondary | ICD-10-CM | POA: Diagnosis not present

## 2021-09-06 NOTE — Assessment & Plan Note (Signed)
Hydrate and monitor 

## 2021-09-06 NOTE — Patient Instructions (Addendum)
Tdap is the tetanus shot that contains the Pertussis booster (whooping cough or croup) ?   ?Add Tai Chi or Yoga (chair) ? ?Hypertension, Adult ?High blood pressure (hypertension) is when the force of blood pumping through the arteries is too strong. The arteries are the blood vessels that carry blood from the heart throughout the body. Hypertension forces the heart to work harder to pump blood and may cause arteries to become narrow or stiff. Untreated or uncontrolled hypertension can cause a heart attack, heart failure, a stroke, kidney disease, and other problems. ?A blood pressure reading consists of a higher number over a lower number. Ideally, your blood pressure should be below 120/80. The first ("top") number is called the systolic pressure. It is a measure of the pressure in your arteries as your heart beats. The second ("bottom") number is called the diastolic pressure. It is a measure of the pressure in your arteries as the heart relaxes. ?What are the causes? ?The exact cause of this condition is not known. There are some conditions that result in or are related to high blood pressure. ?What increases the risk? ?Some risk factors for high blood pressure are under your control. The following factors may make you more likely to develop this condition: ?Smoking. ?Having type 2 diabetes mellitus, high cholesterol, or both. ?Not getting enough exercise or physical activity. ?Being overweight. ?Having too much fat, sugar, calories, or salt (sodium) in your diet. ?Drinking too much alcohol. ?Some risk factors for high blood pressure may be difficult or impossible to change. Some of these factors include: ?Having chronic kidney disease. ?Having a family history of high blood pressure. ?Age. Risk increases with age. ?Race. You may be at higher risk if you are African American. ?Gender. Men are at higher risk than women before age 23. After age 39, women are at higher risk than men. ?Having obstructive sleep  apnea. ?Stress. ?What are the signs or symptoms? ?High blood pressure may not cause symptoms. Very high blood pressure (hypertensive crisis) may cause: ?Headache. ?Anxiety. ?Shortness of breath. ?Nosebleed. ?Nausea and vomiting. ?Vision changes. ?Severe chest pain. ?Seizures. ?How is this diagnosed? ?This condition is diagnosed by measuring your blood pressure while you are seated, with your arm resting on a flat surface, your legs uncrossed, and your feet flat on the floor. The cuff of the blood pressure monitor will be placed directly against the skin of your upper arm at the level of your heart. It should be measured at least twice using the same arm. Certain conditions can cause a difference in blood pressure between your right and left arms. ?Certain factors can cause blood pressure readings to be lower or higher than normal for a short period of time: ?When your blood pressure is higher when you are in a health care provider's office than when you are at home, this is called white coat hypertension. Most people with this condition do not need medicines. ?When your blood pressure is higher at home than when you are in a health care provider's office, this is called masked hypertension. Most people with this condition may need medicines to control blood pressure. ?If you have a high blood pressure reading during one visit or you have normal blood pressure with other risk factors, you may be asked to: ?Return on a different day to have your blood pressure checked again. ?Monitor your blood pressure at home for 1 week or longer. ?If you are diagnosed with hypertension, you may have other blood or imaging  tests to help your health care provider understand your overall risk for other conditions. ?How is this treated? ?This condition is treated by making healthy lifestyle changes, such as eating healthy foods, exercising more, and reducing your alcohol intake. Your health care provider may prescribe medicine if  lifestyle changes are not enough to get your blood pressure under control, and if: ?Your systolic blood pressure is above 130. ?Your diastolic blood pressure is above 80. ?Your personal target blood pressure may vary depending on your medical conditions, your age, and other factors. ?Follow these instructions at home: ?Eating and drinking ? ?Eat a diet that is high in fiber and potassium, and low in sodium, added sugar, and fat. An example eating plan is called the DASH (Dietary Approaches to Stop Hypertension) diet. To eat this way: ?Eat plenty of fresh fruits and vegetables. Try to fill one half of your plate at each meal with fruits and vegetables. ?Eat whole grains, such as whole-wheat pasta, brown rice, or whole-grain bread. Fill about one fourth of your plate with whole grains. ?Eat or drink low-fat dairy products, such as skim milk or low-fat yogurt. ?Avoid fatty cuts of meat, processed or cured meats, and poultry with skin. Fill about one fourth of your plate with lean proteins, such as fish, chicken without skin, beans, eggs, or tofu. ?Avoid pre-made and processed foods. These tend to be higher in sodium, added sugar, and fat. ?Reduce your daily sodium intake. Most people with hypertension should eat less than 1,500 mg of sodium a day. ?Do not drink alcohol if: ?Your health care provider tells you not to drink. ?You are pregnant, may be pregnant, or are planning to become pregnant. ?If you drink alcohol: ?Limit how much you use to: ?0-1 drink a day for women. ?0-2 drinks a day for men. ?Be aware of how much alcohol is in your drink. In the [place] one drink equals one 12 oz bottle of beer (355 mL), one 5 oz glass of wine (148 mL), or one 1? oz glass of hard liquor (44 mL). ?Lifestyle ? ?Work with your health care provider to maintain a healthy body weight or to lose weight. Ask what an ideal weight is for you. ?Get at least 30 minutes of exercise most days of the week. Activities may include walking,  swimming, or biking. ?Include exercise to strengthen your muscles (resistance exercise), such as Pilates or lifting weights, as part of your weekly exercise routine. Try to do these types of exercises for 30 minutes at least 3 days a week. ?Do not use any products that contain nicotine or tobacco, such as cigarettes, e-cigarettes, and chewing tobacco. If you need help quitting, ask your health care provider. ?Monitor your blood pressure at home as told by your health care provider. ?Keep all follow-up visits as told by your health care provider. This is important. ?Medicines ?Take over-the-counter and prescription medicines only as told by your health care provider. Follow directions carefully. Blood pressure medicines must be taken as prescribed. ?Do not skip doses of blood pressure medicine. Doing this puts you at risk for problems and can make the medicine less effective. ?Ask your health care provider about side effects or reactions to medicines that you should watch for. ?Contact a health care provider if you: ?Think you are having a reaction to a medicine you are taking. ?Have headaches that keep coming back (recurring). ?Feel dizzy. ?Have swelling in your ankles. ?Have trouble with your vision. ?Get help right away if you: ?Develop  a severe headache or confusion. ?Have unusual weakness or numbness. ?Feel faint. ?Have severe pain in your chest or abdomen. ?Vomit repeatedly. ?Have trouble breathing. ?Summary ?Hypertension is when the force of blood pumping through your arteries is too strong. If this condition is not controlled, it may put you at risk for serious complications. ?Your personal target blood pressure may vary depending on your medical conditions, your age, and other factors. For most people, a normal blood pressure is less than 120/80. ?Hypertension is treated with lifestyle changes, medicines, or a combination of both. Lifestyle changes include losing weight, eating a healthy, low-sodium diet,  exercising more, and limiting alcohol. ?This information is not intended to replace advice given to you by your health care provider. Make sure you discuss any questions you have with your health care provider. ?Document

## 2021-09-06 NOTE — Assessment & Plan Note (Signed)
Tolerating statin, encouraged heart healthy diet, avoid trans fats, minimize simple carbs and saturated fats. Increase exercise as tolerated 

## 2021-09-06 NOTE — Assessment & Plan Note (Signed)
Check PSA and report worsening symptoms ?

## 2021-09-06 NOTE — Progress Notes (Signed)
? ?Subjective:  ? ?By signing my name below, I, [name], attest that this documentation has been prepared under the direction and in the presence of [name], MD. 09/06/2021  ? ? ? Patient ID: James [name], male    DOB: Nov 19, 1940, 81 y.o.   MRN: [id number] ? ?Chief Complaint  ?Patient presents with  ? Follow-up  ? ? ?HPI ?Patient is in today for an office visit and 6 month f/u. ? ?He id doing well at this time. He is keeping busy and preparing for 15[place] walk [place].  ? ?He checks his blood pressure occasionally at home. His blood pressure is stable now but he would like it to be lower. ?BP Readings from Last 3 Encounters:  ?09/06/21 135/80  ?06/07/21 112/70  ?01/11/21 112/64  ?  ?He would like suggestions to improve his balance. He has not been steady as he used to be but denies falls and stumbling. He will like to see a physical therapist.  ? ?UTD on dental and vision care.  ? ?He will like to get a PSA screening for nocturia.  ? ?He is UTD on shingles and pneumonia vaccines. Not UTD on tetanus vaccine.  ? ?Past Medical History:  ?Diagnosis Date  ? AAA (abdominal aortic aneurysm) without rupture 09/08/2013  ? Abdominal bloating 05/19/2016  ? Anxiety state 05/18/2014  ? [place]   ? 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 Benign paroxysmal positional vertigo 04/06/2013  ? CAD (coronary artery disease)   ? nonobstructive by cath 2008; nonobstructive by Cardiac CT 11/2011  ? [place]ical [place]  ? C6-7  ? Diverticulosis   ? Erectile dysfunction 09/08/2013  ? GERD (gastroesophageal reflux disease)   ? Hiatal hernia   ? History of atrial fibrillation   ? History of squamous cell carcinoma 10-05-10  [place] ? HLD (hyperlipidemia)   ? Hypothyroidism   ? Medicare annual wellness visit, subsequent 03/16/2014  ? Sees [facility] Follows with Dr NJohnsie Cancelof cardiology [place] in 2013    ? Mitral valve prolapse   ? Neck pain 05/03/2015  ? Preventative health care 03/16/2014  ? 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 s/p excision  ? Syncope 02/03/2013  ? 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  ? ? ?Past Surgical History:  ?Procedure Laterality Date  ? APPENDECTOMY  05/2011  ? CARDIAC CATHETERIZATION  01/03/12  ? 30% ostial LAD stenosis, ostial 30-40% D1 stenosis, smooth and normal left main and RCA; LVEF 65%  ? CHOLECYSTECTOMY N/A 08/25/2014  ? Procedure: LAPAROSCOPIC CHOLECYSTECTOMY WITH INTRAOPERATIVE CHOLANGIOGRAM;  Surgeon: TJackolyn Confer MD;  [facility]  Service: General;  Laterality: N/A;  ? INGUINAL HERNIA REPAIR  07/2010  ? right  ? KNEE ARTHROPLASTY    ? bilat  ? LEFT HEART CATHETERIZATION WITH CORONARY ANGIOGRAM N/A 01/03/2012  ? Procedure: LEFT HEART CATHETERIZATION WITH CORONARY ANGIOGRAM;  Surgeon: PThayer Headings MD;  [facility];  Service: Cardiovascular;  Laterality: N/A;  ? MRI  10/31/2016  ? TONSILLECTOMY  1947  ? tumor removal  1958  ? "fatty tumor"  ? ? ?Family History  ?Problem Relation Age of Onset  ? Heart disease Mother   ?     MI 820s ? Stroke Mother   ? Hypertension Mother   ? Heart disease Maternal Grandfather   ?     MI 70-80s  ?  Heart disease Maternal Grandmother   ?     MI 70-80s  ? Lung disease Father   ?     penumonitis  ? Cancer Sister   ?     colon  ? Cancer Paternal Uncle   ?     GI: colon he thinks  ? ? ?Social History  ? ?Socioeconomic History  ? Marital status: Single  ?  Spouse name: Not on file  ? Number of children: Not on file  ? Years of education: Not on file  ? Highest education level: Not on file  ?Occupational History  ? 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 Smoking status: Never  ? Smokeless tobacco: Never  ?Substance and Sexual Activity  ? Alcohol use: Yes  ?  Comment: 2 glasses red wine/day  ? Drug use: No  ? Sexual activity: Yes  ?Other Topics Concern  ? Not on file  ?Social History Narrative  ? Not on file  ? ?Social  Determinants of Health  ? ?Financial Resource Strain: Low Risk   ? Difficulty of Paying Living Expenses: Not very hard  ?Food Insecurity: No Food Insecurity  ? Worried About Charity fundraiser in the Last Year: Never true  ? Ran Out of Food in the Last Year: Never true  ?Transportation Needs: No Transportation Needs  ? Lack of Transportation (Medical): No  ? Lack of Transportation (Non-Medical): No  ?Physical Activity: Sufficiently Active  ? Days of Exercise per Week: 5 days  ? Minutes of Exercise per Session: 60 min  ?Stress: Not on file  ?Social Connections: Not on file  ?Intimate Partner Violence: Not on file  ? ? ?Outpatient Medications Prior to Visit  ?Medication Sig Dispense Refill  ? aspirin EC 81 MG tablet Take 81 mg by mouth. About once a five times a week    ? atorvastatin (LIPITOR) 20 MG tablet Take 1 tablet (20 mg total) by mouth daily. 90 tablet 3  ? budesonide (ENTOCORT EC) 3 MG 24 hr capsule Take 9 mg by mouth every morning.    ? Calcium Carb-Cholecalciferol (CALCIUM 1000 + D PO) Take 1 tablet by mouth 3 (three) times a week.     ? Cyanocobalamin (VITAMIN B 12) 500 MCG TABS Take 1 tablet by mouth daily.    ? diclofenac Sodium (VOLTAREN) 1 % GEL Apply 4 g topically 4 (four) times daily. To affected joint. 100 g 11  ? Dutasteride-Tamsulosin HCl 0.5-0.4 MG CAPS TAKE 1 CAPSULE BY MOUTH AT BEDTIME 90 capsule 1  ? ELDERBERRY PO Take 50 mg by mouth. 3 times per week    ? levothyroxine (SYNTHROID) 100 MCG tablet TAKE 1 TABLET BY MOUTH EVERY MORNING BEFORE BREAKFAST 90 tablet 1  ? LORazepam (ATIVAN) 0.5 MG tablet TAKE 1 TABLET BY MOUTH TWICE DAILY AS NEEDED FOR ANXIETY 30 tablet 1  ? Melatonin 10 MG TABS Take 1 tablet by mouth at bedtime as needed.    ? Milk Thistle 1000 MG CAPS Take 1 capsule by mouth daily.    ? mirtazapine (REMERON) 15 MG tablet TAKE 1 TABLET BY MOUTH AT BEDTIME 90 tablet 1  ? Multiple Vitamins-Minerals (MULTIVITAMINS THER. W/MINERALS) TABS Take 1 tablet by mouth daily.    ? pantoprazole  (PROTONIX) 40 MG tablet TAKE 1 TABLET BY MOUTH 2 TIMES A DAY BEFORE MEALS FOR ACID REFLUX 180 tablet 3  ? Probiotic Product (PROBIOTIC DAILY PO) Take by mouth.    ? sildenafil (REVATIO)  20 MG tablet TAKE 1 TO 5 TABLETS BY MOUTH EVERY DAY AS NEEDED 35 tablet 3  ? Testosterone 30 MG/ACT SOLN Apply 1 pump under each arm every morning as directed 180 mL 0  ? TURMERIC CURCUMIN PO Take 1 tablet by mouth daily.    ? zolpidem (AMBIEN) 10 MG tablet Take 1 tablet (10 mg total) by mouth at bedtime as needed. for sleep 30 tablet 2  ? ?No facility-administered medications prior to visit.  ? ? ?Allergies  ?Allergen Reactions  ? Sulfa Antibiotics Other (See Comments)  ?  Childhood allergy  ? ? ?Review of Systems  ?Constitutional:  Negative for fever and malaise/fatigue.  ?HENT:  Negative for congestion.   ?Eyes:  Negative for redness.  ?Respiratory:  Negative for shortness of breath.   ?Cardiovascular:  Negative for chest pain, palpitations and leg swelling.  ?Gastrointestinal:  Negative for abdominal pain, blood in stool and nausea.  ?Genitourinary:  Negative for dysuria and frequency.  ?     (+) nocturia   ?Musculoskeletal:  Negative for falls and myalgias.  ?Skin:  Negative for rash.  ?Neurological:  Negative for dizziness, loss of consciousness and headaches.  ?     (+) loss of balance  ?Endo/Heme/Allergies:  Negative for polydipsia.  ?Psychiatric/Behavioral:  Negative for depression. The patient is not nervous/anxious.   ? ?   ?Objective:  ?  ?Physical Exam ?Constitutional:   ?   Appearance: Normal appearance. He is not ill-appearing.  ?HENT:  ?   Head: Normocephalic and atraumatic.  ?   Right Ear: Tympanic membrane, ear canal and external ear normal.  ?   Left Ear: Tympanic membrane, ear canal and external ear normal.  ?Eyes:  ?   Conjunctiva/sclera: Conjunctivae normal.  ?Cardiovascular:  ?   Rate and Rhythm: Normal rate and regular rhythm.  ?   Heart sounds: Normal heart sounds. No murmur heard. ?Pulmonary:  ?   Breath  sounds: Normal breath sounds. No wheezing.  ?Abdominal:  ?   General: Bowel sounds are normal. There is no distension.  ?   Palpations: Abdomen is soft.  ?   Tenderness: There is no abdominal tenderness.

## 2021-09-06 NOTE — Assessment & Plan Note (Signed)
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  ?

## 2021-09-07 LAB — PSA: PSA: 0.4 ng/mL (ref 0.10–4.00)

## 2021-09-17 ENCOUNTER — Other Ambulatory Visit: Payer: Self-pay | Admitting: Family Medicine

## 2021-09-17 NOTE — Telephone Encounter (Signed)
Requesting: lorazepam 0.'5mg'$   ?Contract: 09/03/21 ?UDS: 09/06/21 ?Last Visit: 09/06/21 ?Next Visit: 03/10/22 ?Last Refill: 07/20/2021 #30 and 1RF ? ?Please Advise ? ?

## 2021-09-21 ENCOUNTER — Ambulatory Visit: Payer: Medicare Other | Attending: Family Medicine | Admitting: Physical Therapy

## 2021-09-21 DIAGNOSIS — R2681 Unsteadiness on feet: Secondary | ICD-10-CM | POA: Insufficient documentation

## 2021-09-21 DIAGNOSIS — M6281 Muscle weakness (generalized): Secondary | ICD-10-CM | POA: Insufficient documentation

## 2021-09-21 NOTE — Patient Instructions (Signed)
Access Code: [email or web address] ?URL: [email or web address] ?Date: 09/21/2021 ?Prepared by: Glenetta Hew ? ?Exercises ?- Standing Tandem Balance with Counter Support  - 1 x daily - 7 x weekly - 1 sets - 3 reps - 30 sec  hold ?- Standing Single Leg Stance with Counter Support  - 1 x daily - 7 x weekly - 1 sets - 3 reps - 30 sec  hold ?- Squat with Chair Touch  - 1 x daily - 7 x weekly - 2-3 sets - 10 reps ?

## 2021-09-21 NOTE — Therapy (Signed)
[facility] ?[address] ?[place], [place], [address] ?Phone: [phone]   Fax:  [phone] ? ?Physical Therapy Evaluation ? ?Patient Details  ?Name: James [name] ?MRN: [id number] ?Date of Birth: 01-24-1941 ?Referring Provider (PT): Penni Homans A ? ? ?Encounter Date: 09/21/2021 ? ? PT End of Session - 09/21/21 1805   ? ? Visit Number 1   ? Number of Visits 6   ? Date for PT Re-Evaluation 11/02/21   ? Authorization Type Medicare & Federal BCBS   ? Progress Note Due on Visit 6   ? PT Start Time 1315   ? PT Stop Time 1400   ? PT Time Calculation (min) 45 min   ? Activity Tolerance Patient tolerated treatment well   ? Behavior During Therapy [facility] for tasks assessed/performed   ? ?  ?  ? ?  ? ? ?Past Medical History:  ?Diagnosis Date  ? AAA (abdominal aortic aneurysm) without rupture 09/08/2013  ? Abdominal bloating 05/19/2016  ? Anxiety state 05/18/2014  ? Atrial fibrillation ([place])   ? 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 Benign paroxysmal positional vertigo 04/06/2013  ? CAD (coronary artery disease)   ? nonobstructive by cath 2008; nonobstructive by Cardiac CT 11/2011  ? Cervical spine fracture ([place]) 1980  ? C6-7  ? Diverticulosis   ? Erectile dysfunction 09/08/2013  ? GERD (gastroesophageal reflux disease)   ? Hiatal hernia   ? History of atrial fibrillation   ? History of squamous cell carcinoma 10-05-10  ? Dr [name], [place]  ? HLD (hyperlipidemia)   ? Hypothyroidism   ? Medicare annual wellness visit, subsequent 03/16/2014  ? Sees [facility] Follows with Dr Johnsie Cancel of cardiology Sees [facility] for colonoscopy, last colonoscopy in 2013   ? Mitral valve prolapse   ? Neck pain 05/03/2015  ? Preventative health care 03/16/2014  ? 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 s/p excision  ? Syncope 02/03/2013  ? 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  ? ? ?Past Surgical History:  ?Procedure Laterality Date  ? APPENDECTOMY  05/2011  ?  CARDIAC CATHETERIZATION  01/03/12  ? 30% ostial LAD stenosis, ostial 30-40% D1 stenosis, smooth and normal left main and RCA; LVEF 65%  ? CHOLECYSTECTOMY N/A 08/25/2014  ? Procedure: LAPAROSCOPIC CHOLECYSTECTOMY WITH INTRAOPERATIVE CHOLANGIOGRAM;  Surgeon: [name], MD;  Location: [facility];  Service: General;  Laterality: N/A;  ? INGUINAL HERNIA REPAIR  07/2010  ? right  ? KNEE ARTHROPLASTY    ? bilat  ? LEFT HEART CATHETERIZATION WITH CORONARY ANGIOGRAM N/A 01/03/2012  ? Procedure: LEFT HEART CATHETERIZATION WITH CORONARY ANGIOGRAM;  Surgeon: [name], MD;  Location: [facility];  Service: Cardiovascular;  Laterality: N/A;  ? MRI  10/31/2016  ? TONSILLECTOMY  1947  ? tumor removal  1958  ? "fatty tumor"  ? ? ?There were no vitals filed for this visit. ? ? ? Subjective Assessment - 09/21/21 1319   ? ? Subjective I don't think its anything dramatic, but in recent years not as good as it used to be.  Does Sr. games - power walking 1500 meters.  Denies falls.  Did have some R knee pain that started yesterday but calming down already.  Exercises every other day - 2 miles on track, 1x/week jogging 4 miles, or 1500 time trial.   ? Pertinent History syncope, neck pain, mitral valve prolapse, hypothyroidism, HLD, a-fib, hiatal hernia, GERD, C6-7  fx 1980, CAD, BPPV, anxiety, AAA, B knee arthroscopy, hx skin CA   ? Patient Stated Goals go faster, feel more steady, be sure of balance around the house   ? Currently in Pain? Yes   ? Pain Score 3    ? Pain Location Knee   ? Pain Orientation Left   ? Pain Descriptors / Indicators Shooting   ? Pain Type Acute pain   ? ?  ?  ? ?  ? ? ? ? ? [facility] PT Assessment - 09/21/21 0001   ? ?  ? Assessment  ? Medical Diagnosis R26.81 (ICD-10-CM) - Unsteady gait   ? Referring Provider (PT) Penni Homans A   ? Prior Therapy yes for L hip pain 2022   ?  ? Precautions  ? Precautions None   ?  ? Restrictions  ? Weight Bearing Restrictions No   ?  ? Balance Screen  ? Has the patient fallen in  the past 6 months No   ? Has the patient had a decrease in activity level because of a fear of falling?  No   ? Is the patient reluctant to leave their home because of a fear of falling?  No   ?  ? Home Environment  ? Living Environment Private residence   ? Type of Home House   ? Home Layout Multi-level   ? Alternate Level Stairs-Number of Steps 10   ?  ? Cognition  ? Overall Cognitive Status Within Functional Limits for tasks assessed   ?  ? Observation/Other Assessments  ? Observations enters independently without deviation or device, no apparent distress.   ? Focus on Therapeutic Outcomes (FOTO)  balance 60%.  Only 62% predicted after 12 visits.   ?  ? Posture/Postural Control  ? Posture/Postural Control Postural limitations   ? Postural Limitations Forward head   ?  ? ROM / Strength  ? AROM / PROM / Strength Strength   ?  ? Strength  ? Overall Strength Deficits   ? Overall Strength Comments 5/5 LE strength except hip extensors 4+/5 bil, glut med 4+/5 bil   ?  ? Ambulation/Gait  ? Ambulation/Gait No   ? Ambulation Distance (Feet) 600 Feet   ? Assistive device None   ? Gait Pattern Within Functional Limits   ? Gait velocity not formally measured, but rapid walking speed >1.33 m/s   ? Stairs Yes   ? Stairs Assistance 7: Independent   ? Stair Management Technique No rails   ? Number of Stairs 14   ? Height of Stairs 8   ?  ? Balance  ? Balance Assessed Yes   ?  ? Static Standing Balance  ? Static Standing - Balance Support No upper extremity supported   ? Static Standing - Level of Assistance 7: Independent   ? Static Standing Balance -  Activities  Single Leg Stance - Right Leg;Single Leg Stance - Left Leg;Tandam Stance - Right Leg;Tandam Stance - Left Leg;Romberg - Eyes Opened;Romberg - Eyes Closed;Romberg - Eyes Opened, Foam;Romberg - Eyes Closed , Foam   ? Static Standing - Comment/# of Minutes SLS x 7 sec bil, tandem stance 30 sec bil, conditons 1-4 mCTSIB 30 sec   ?  ? Standardized Balance Assessment  ?  Standardized Balance Assessment Berg Balance Test;Five Times Sit to Stand   ?  ? Berg Balance Test  ? Sit to Stand Able to stand without using hands and stabilize independently   ? Standing Unsupported  Able to stand safely 2 minutes   ? Sitting with Back Unsupported but Feet Supported on Floor or Stool Able to sit safely and securely 2 minutes   ? Stand to Sit Sits safely with minimal use of hands   ? Transfers Able to transfer safely, minor use of hands   ? Standing Unsupported with Eyes Closed Able to stand 10 seconds safely   ? Standing Unsupported with Feet Together Able to place feet together independently and stand 1 minute safely   ? From Standing, Reach Forward with Outstretched Arm Can reach confidently >25 cm (10")   ? From Standing Position, Pick up Object from [place] to pick up shoe safely and easily   ? From Standing Position, Turn to Look Behind Over each Shoulder Needs supervision when turning   very unsteady  ? Turn 360 Degrees Able to turn 360 degrees safely in 4 seconds or less   ? Standing Unsupported, Alternately Place Feet on Step/Stool Able to stand independently and safely and complete 8 steps in 20 seconds   ? Standing Unsupported, One Foot in [place] to place foot tandem independently and hold 30 seconds   ? Standing on One Leg Able to lift leg independently and hold 5-10 seconds   ? Total Score 52   ? Berg comment: low risk of falls   ?  ? Functional Gait  Assessment  ? Gait assessed  Yes   ? Gait Level Surface Walks 20 ft in less than 5.5 sec, no assistive devices, good speed, no evidence for imbalance, normal gait pattern, deviates no more than 6 in outside of the 12 in walkway width.   ? Change in Gait Speed Able to smoothly change walking speed without loss of balance or gait deviation. Deviate no more than 6 in outside of the 12 in walkway width.   ? Gait with Horizontal Head Turns Performs head turns smoothly with slight change in gait velocity (eg, minor disruption to smooth  gait path), deviates 6-10 in outside 12 in walkway width, or uses an assistive device.   ? Gait with Vertical Head Turns Performs task with slight change in gait velocity (eg, minor disruption to smooth gait

## 2021-09-23 ENCOUNTER — Ambulatory Visit (INDEPENDENT_AMBULATORY_CARE_PROVIDER_SITE_OTHER): Payer: Medicare Other

## 2021-09-23 ENCOUNTER — Ambulatory Visit: Payer: Medicare Other | Admitting: Physical Therapy

## 2021-09-23 VITALS — Ht 70.0 in | Wt 155.0 lb

## 2021-09-23 DIAGNOSIS — Z0000 Encounter for general adult medical examination without abnormal findings: Secondary | ICD-10-CM | POA: Diagnosis not present

## 2021-09-23 NOTE — Patient Instructions (Signed)
James [name] , ?Thank you for taking time to come for your Medicare Wellness Visit. I appreciate your ongoing commitment to your health goals. Please review the following plan we discussed and let me know if I can assist you in the future.  ? ?Screening recommendations/referrals: ?Colonoscopy: check with Dr. Charlett Blake due to family history ?Recommended yearly ophthalmology/optometry visit for glaucoma screening and checkup ?Recommended yearly dental visit for hygiene and checkup ? ?Vaccinations: ?Influenza vaccine: due next flu season ?Pneumococcal vaccine: completed 03/11/2014 ?Tdap vaccine: due ?Shingles vaccine: completed   ?Covid-19:  04/24/2020, 07/27/2019, 07/06/2019 ? ?Advanced directives: Advance directive discussed with you today.  ? ?Conditions/risks identified: none ? ?Next appointment: Follow up in one year for your annual wellness visit.  ? ?Preventive Care 45 Years and Older, Male ?Preventive care refers to lifestyle choices and visits with your health care provider that can promote health and wellness. ?What does preventive care include? ?A yearly physical exam. This is also called an annual well check. ?Dental exams once or twice a year. ?Routine eye exams. Ask your health care provider how often you should have your eyes checked. ?Personal lifestyle choices, including: ?Daily care of your teeth and gums. ?Regular physical activity. ?Eating a healthy diet. ?Avoiding tobacco and drug use. ?Limiting alcohol use. ?Practicing safe sex. ?Taking low doses of aspirin every day. ?Taking vitamin and mineral supplements as recommended by your health care provider. ?What happens during an annual well check? ?The services and screenings done by your health care provider during your annual well check will depend on your age, overall health, lifestyle risk factors, and family history of disease. ?Counseling  ?Your health care provider may ask you questions about your: ?Alcohol use. ?Tobacco use. ?Drug use. ?Emotional  well-being. ?Home and relationship well-being. ?Sexual activity. ?Eating habits. ?History of falls. ?Memory and ability to understand (cognition). ?Work and work Statistician. ?Screening  ?You may have the following tests or measurements: ?Height, weight, and BMI. ?Blood pressure. ?Lipid and cholesterol levels. These may be checked every 5 years, or more frequently if you are over 32 years old. ?Skin check. ?Lung cancer screening. You may have this screening every year starting at age 40 if you have a 30-pack-year history of smoking and currently smoke or have quit within the past 15 years. ?Fecal occult blood test (FOBT) of the stool. You may have this test every year starting at age 67. ?Flexible sigmoidoscopy or colonoscopy. You may have a sigmoidoscopy every 5 years or a colonoscopy every 10 years starting at age 42. ?Prostate cancer screening. Recommendations will vary depending on your family history and other risks. ?Hepatitis C blood test. ?Hepatitis B blood test. ?Sexually transmitted disease (STD) testing. ?Diabetes screening. This is done by checking your blood sugar (glucose) after you have not eaten for a while (fasting). You may have this done every 1-3 years. ?Abdominal aortic aneurysm (AAA) screening. You may need this if you are a current or former smoker. ?Osteoporosis. You may be screened starting at age 5 if you are at high risk. ?Talk with your health care provider about your test results, treatment options, and if necessary, the need for more tests. ?Vaccines  ?Your health care provider may recommend certain vaccines, such as: ?Influenza vaccine. This is recommended every year. ?Tetanus, diphtheria, and acellular pertussis (Tdap, Td) vaccine. You may need a Td booster every 10 years. ?Zoster vaccine. You may need this after age 70. ?Pneumococcal 13-valent conjugate (PCV13) vaccine. One dose is recommended after age 68. ?Pneumococcal polysaccharide (PPSV23)  vaccine. One dose is recommended after  age 17. ?Talk to your health care provider about which screenings and vaccines you need and how often you need them. ?This information is not intended to replace advice given to you by your health care provider. Make sure you discuss any questions you have with your health care provider. ?Document Released: 07/03/2015 Document Revised: 02/24/2016 Document Reviewed: 04/07/2015 ?Elsevier Interactive Patient Education ? 2017 [place]. ? ?Fall Prevention in the Home ?Falls can cause injuries. They can happen to people of all ages. There are many things you can do to make your home safe and to help prevent falls. ?What can I do on the outside of my home? ?Regularly fix the edges of walkways and driveways and fix any cracks. ?Remove anything that might make you trip as you walk through a door, such as a raised step or threshold. ?Trim any bushes or trees on the path to your home. ?Use bright outdoor lighting. ?Clear any walking paths of anything that might make someone trip, such as rocks or tools. ?Regularly check to see if handrails are loose or broken. Make sure that both sides of any steps have handrails. ?Any raised decks and porches should have guardrails on the edges. ?Have any leaves, snow, or ice cleared regularly. ?Use sand or salt on walking paths during winter. ?Clean up any spills in your garage right away. This includes oil or grease spills. ?What can I do in the bathroom? ?Use night lights. ?Install grab bars by the toilet and in the tub and shower. Do not use towel bars as grab bars. ?Use non-skid mats or decals in the tub or shower. ?If you need to sit down in the shower, use a plastic, non-slip stool. ?Keep the floor dry. Clean up any water that spills on the floor as soon as it happens. ?Remove soap buildup in the tub or shower regularly. ?Attach bath mats securely with double-sided non-slip rug tape. ?Do not have throw rugs and other things on the floor that can make you trip. ?What can I do in the  bedroom? ?Use night lights. ?Make sure that you have a light by your bed that is easy to reach. ?Do not use any sheets or blankets that are too big for your bed. They should not hang down onto the floor. ?Have a firm chair that has side arms. You can use this for support while you get dressed. ?Do not have throw rugs and other things on the floor that can make you trip. ?What can I do in the kitchen? ?Clean up any spills right away. ?Avoid walking on wet floors. ?Keep items that you use a lot in easy-to-reach places. ?If you need to reach something above you, use a strong step stool that has a grab bar. ?Keep electrical cords out of the way. ?Do not use floor polish or wax that makes floors slippery. If you must use wax, use non-skid floor wax. ?Do not have throw rugs and other things on the floor that can make you trip. ?What can I do with my stairs? ?Do not leave any items on the stairs. ?Make sure that there are handrails on both sides of the stairs and use them. Fix handrails that are broken or loose. Make sure that handrails are as long as the stairways. ?Check any carpeting to make sure that it is firmly attached to the stairs. Fix any carpet that is loose or worn. ?Avoid having throw rugs at the top or bottom  of the stairs. If you do have throw rugs, attach them to the floor with carpet tape. ?Make sure that you have a light switch at the top of the stairs and the bottom of the stairs. If you do not have them, ask someone to add them for you. ?What else can I do to help prevent falls? ?Wear shoes that: ?Do not have high heels. ?Have rubber bottoms. ?Are comfortable and fit you well. ?Are closed at the toe. Do not wear sandals. ?If you use a stepladder: ?Make sure that it is fully opened. Do not climb a closed stepladder. ?Make sure that both sides of the stepladder are locked into place. ?Ask someone to hold it for you, if possible. ?Clearly mark and make sure that you can see: ?Any grab bars or  handrails. ?First and last steps. ?Where the edge of each step is. ?Use tools that help you move around (mobility aids) if they are needed. These include: ?Canes. ?Walkers. ?Scooters. ?Crutches. ?Turn on the lights when

## 2021-09-23 NOTE — Progress Notes (Signed)
?I connected with James [name] today by telephone and verified that I am speaking with the correct person using two identifiers. ?Location patient: home ?Location provider: work ?Persons participating in the virtual visit: James [name], [name] LPN. ?  ?I discussed the limitations, risks, security and privacy concerns of performing an evaluation and management service by telephone and the availability of in person appointments. I also discussed with the patient that there may be a patient responsible charge related to this service. The patient expressed understanding and verbally consented to this telephonic visit.  ?  ?Interactive audio and video telecommunications were attempted between this provider and patient, however failed, due to patient having technical difficulties OR patient did not have access to video capability.  We continued and completed visit with audio only. ? ?  ? ?Vital signs may be patient reported or missing. ? ?Subjective:  ? James [name] is a 81 y.o. male who presents for Medicare Annual/Subsequent preventive examination. ? ?Review of Systems    ? ?Cardiac Risk Factors include: advanced age (>32mn, >>68women);dyslipidemia;hypertension;male gender ? ?   ?Objective:  ?  ?Today's Vitals  ? 09/23/21 1057  ?Weight: 155 lb (70.3 kg)  ?Height: '5\' 10"'$  (1.778 m)  ? ?Body mass index is 22.24 kg/m?. ? ? ?  09/23/2021  ? 11:04 AM 09/21/2021  ?  1:27 PM 07/09/2020  ?  3:55 PM 06/29/2020  ?  2:11 PM 03/07/2020  ? 11:52 AM 06/27/2019  ?  1:21 PM 06/11/2018  ?  1:21 PM  ?Advanced Directives  ?Does Patient Have a Medical Advance Directive? No No No No No No No  ?Would patient like information on creating a medical advance directive?  Yes (MAU/Ambulatory/Procedural Areas - Information given) No - Patient declined No - Patient declined  No - Patient declined Yes (MAU/Ambulatory/Procedural Areas - Information given)  ? ? ?Current Medications (verified) ?Outpatient Encounter Medications as of 09/23/2021  ?Medication  Sig  ? aspirin EC 81 MG tablet Take 81 mg by mouth. About once a five times a week  ? atorvastatin (LIPITOR) 20 MG tablet Take 1 tablet (20 mg total) by mouth daily.  ? budesonide (ENTOCORT EC) 3 MG 24 hr capsule Take 9 mg by mouth every morning.  ? Calcium Carb-Cholecalciferol (CALCIUM 1000 + D PO) Take 1 tablet by mouth 3 (three) times a week.   ? Cyanocobalamin (VITAMIN B 12) 500 MCG TABS Take 1 tablet by mouth daily.  ? diclofenac Sodium (VOLTAREN) 1 % GEL Apply 4 g topically 4 (four) times daily. To affected joint.  ? Dutasteride-Tamsulosin HCl 0.5-0.4 MG CAPS TAKE 1 CAPSULE BY MOUTH AT BEDTIME  ? ELDERBERRY PO Take 50 mg by mouth. 3 times per week  ? levothyroxine (SYNTHROID) 100 MCG tablet TAKE 1 TABLET BY MOUTH EVERY MORNING BEFORE BREAKFAST  ? LORazepam (ATIVAN) 0.5 MG tablet TAKE 1 TABLET BY MOUTH TWICE DAILY AS NEEDED FOR ANXIETY  ? Melatonin 10 MG TABS Take 1 tablet by mouth at bedtime as needed.  ? Milk Thistle 1000 MG CAPS Take 1 capsule by mouth daily.  ? mirtazapine (REMERON) 15 MG tablet TAKE 1 TABLET BY MOUTH AT BEDTIME  ? Multiple Vitamins-Minerals (MULTIVITAMINS THER. W/MINERALS) TABS Take 1 tablet by mouth daily.  ? pantoprazole (PROTONIX) 40 MG tablet TAKE 1 TABLET BY MOUTH 2 TIMES A DAY BEFORE MEALS FOR ACID REFLUX  ? Probiotic Product (PROBIOTIC DAILY PO) Take by mouth.  ? sildenafil (REVATIO) 20 MG tablet TAKE 1 TO 5 TABLETS BY  MOUTH EVERY DAY AS NEEDED  ? Testosterone 30 MG/ACT SOLN Apply 1 pump under each arm every morning as directed  ? TURMERIC CURCUMIN PO Take 1 tablet by mouth daily.  ? zolpidem (AMBIEN) 10 MG tablet Take 1 tablet (10 mg total) by mouth at bedtime as needed. for sleep  ? ?No facility-administered encounter medications on file as of 09/23/2021.  ? ? ?Allergies (verified) ?Sulfa antibiotics  ? ?History: ?Past Medical History:  ?Diagnosis Date  ? AAA (abdominal aortic aneurysm) without rupture ([place]) 09/08/2013  ? Abdominal bloating 05/19/2016  ? Anxiety state 05/18/2014  ?  Atrial fibrillation ([place])   ? 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 Benign paroxysmal positional vertigo 04/06/2013  ? CAD (coronary artery disease)   ? nonobstructive by cath 2008; nonobstructive by Cardiac CT 11/2011  ? Cervical spine fracture ([place]) 1980  ? C6-7  ? Diverticulosis   ? Erectile dysfunction 09/08/2013  ? GERD (gastroesophageal reflux disease)   ? Hiatal hernia   ? History of atrial fibrillation   ? History of squamous cell carcinoma 10-05-10  ? Dr [name], [place]  ? HLD (hyperlipidemia)   ? Hypothyroidism   ? Medicare annual wellness visit, subsequent 03/16/2014  ? Sees [facility] Follows with Dr Johnsie Cancel of cardiology Sees [facility] for colonoscopy, last colonoscopy in 2013   ? Mitral valve prolapse   ? Neck pain 05/03/2015  ? Preventative health care 03/16/2014  ? 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 s/p excision  ? Syncope 02/03/2013  ? 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  ? ?Past Surgical History:  ?Procedure Laterality Date  ? APPENDECTOMY  05/2011  ? CARDIAC CATHETERIZATION  01/03/12  ? 30% ostial LAD stenosis, ostial 30-40% D1 stenosis, smooth and normal left main and RCA; LVEF 65%  ? CHOLECYSTECTOMY N/A 08/25/2014  ? Procedure: LAPAROSCOPIC CHOLECYSTECTOMY WITH INTRAOPERATIVE CHOLANGIOGRAM;  Surgeon: [name], MD;  Location: [facility];  Service: General;  Laterality: N/A;  ? INGUINAL HERNIA REPAIR  07/2010  ? right  ? KNEE ARTHROPLASTY    ? bilat  ? LEFT HEART CATHETERIZATION WITH CORONARY ANGIOGRAM N/A 01/03/2012  ? Procedure: LEFT HEART CATHETERIZATION WITH CORONARY ANGIOGRAM;  Surgeon: [name], MD;  Location: [facility] CATH LAB;  Service: Cardiovascular;  Laterality: N/A;  ? MRI  10/31/2016  ? TONSILLECTOMY  1947  ? tumor removal  1958  ? "fatty tumor"  ? ?Family History  ?Problem Relation Age of Onset  ? Heart disease Mother   ?     MI 57s  ? Stroke Mother   ? Hypertension Mother   ? Heart disease Maternal Grandfather   ?     MI 70-80s  ? Heart disease Maternal  Grandmother   ?     MI 70-80s  ? Lung disease Father   ?     penumonitis  ? Cancer Sister   ?     colon  ? Cancer Paternal Uncle   ?     GI: colon he thinks  ? ?Social History  ? ?Socioeconomic History  ? Marital status: Single  ?  Spouse name: Not on file  ? Number of children: Not on file  ? Years of education: Not on file  ? Highest education level: Not on file  ?Occupational History  ? 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 Smoking status: Never  ? Smokeless tobacco: Never  ?Vaping Use  ? Vaping Use: Never used  ?Substance  and Sexual Activity  ? Alcohol use: Yes  ?  Comment: 2 glasses red wine/day  ? Drug use: No  ? Sexual activity: Yes  ?Other Topics Concern  ? Not on file  ?Social History Narrative  ? Not on file  ? ?Social Determinants of Health  ? ?Financial Resource Strain: Low Risk   ? Difficulty of Paying Living Expenses: Not hard at all  ?Food Insecurity: No Food Insecurity  ? Worried About Charity fundraiser in the Last Year: Never true  ? Ran Out of Food in the Last Year: Never true  ?Transportation Needs: No Transportation Needs  ? Lack of Transportation (Medical): No  ? Lack of Transportation (Non-Medical): No  ?Physical Activity: Sufficiently Active  ? Days of Exercise per Week: 4 days  ? Minutes of Exercise per Session: 60 min  ?Stress: No Stress Concern Present  ? Feeling of Stress : Not at all  ?Social Connections: Not on file  ? ? ?Tobacco Counseling ?Counseling given: Not Answered ? ? ?Clinical Intake: ? ?Pre-visit preparation completed: Yes ? ?Pain : No/denies pain ? ?  ? ?Nutritional Status: BMI of 19-24  Normal ?Nutritional Risks: None ?Diabetes: No ? ?How often do you need to have someone help you when you read instructions, pamphlets, or other written materials from your doctor or pharmacy?: 1 - Never ?What is the last grade level you completed in school?: masters degree ? ?Diabetic? no ? ?  ? ?  ? ? ?Activities of Daily Living ? ?  09/23/2021  ? 11:06 AM  ?In your  present state of health, do you have any difficulty performing the following activities:  ?Hearing? 0  ?Vision? 0  ?Difficulty concentrating or making decisions? 0  ?Walking or climbing stairs? 0  ?Dressin

## 2021-09-27 ENCOUNTER — Telehealth: Payer: Self-pay | Admitting: Cardiology

## 2021-09-27 ENCOUNTER — Other Ambulatory Visit: Payer: Self-pay | Admitting: Family Medicine

## 2021-09-27 NOTE — Telephone Encounter (Signed)
Patient is supposed to be having a stress test on 4/17 to see if he needs to see Dr. Johney Frame become having surgery. Please call to verify. ?

## 2021-09-27 NOTE — Progress Notes (Deleted)
?Cardiology Office Note:   ? ?Date:  09/27/2021  ? ?ID:  James [name], DOB [birth year], MRN [id number] ? ?PCP:  [name], MD  ?[facility] Cardiologist:  None  ?[place] Electrophysiologist:  None  ? ?Referring MD: [name], MD  ? ? ?History of Present Illness:   ? ?James [name] is a 81 y.o. male with a hx of AAA, nonobstructive CAD, and HLD who presents to clinic for follow-up.  ?  ?Patient was in ED on 09/18/2 after running 5K where he developed significant lightheadedness.No LOC. Just felt weak and exhausted. No chest pain/pressure or SOB. Had diarrhea for several weeks prior leading up to the run having on average 6 episodes per day. ECG with NSR without ischemic changes.Trop 29-->32. Thought to be due to dehydration. ? ?He presented to Cardiology clinic on 03/17/20 for pre-operative clearance for colonoscopy. Myoview was negative for ischemia. EF normal. TTE with normal BiV function, no significant valvular abnormalities. Colonoscopy with lymphocytic colitis.  Polyp was benign. He was given medication to take for 3 months and his symptoms have since resolved.  ? ?Last seen in clinic on 05/2020 where he was doing very well from a CV standpoint. Remained very active. ? ?Today, *** ? ?Past Medical History:  ?Diagnosis Date  ? AAA (abdominal aortic aneurysm) without rupture ([place]) 09/08/2013  ? Abdominal bloating 05/19/2016  ? Anxiety state 05/18/2014  ? Atrial fibrillation ([place])   ? 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 Benign paroxysmal positional vertigo 04/06/2013  ? CAD (coronary artery disease)   ? nonobstructive by cath 2008; nonobstructive by Cardiac CT 11/2011  ? Cervical spine fracture ([place]) 1980  ? C6-7  ? Diverticulosis   ? Erectile dysfunction 09/08/2013  ? GERD (gastroesophageal reflux disease)   ? Hiatal hernia   ? History of atrial fibrillation   ? History of squamous cell carcinoma 10-05-10  ? Dr [name], [place]  ? HLD (hyperlipidemia)   ? Hypothyroidism   ? Medicare  annual wellness visit, subsequent 03/16/2014  ? Sees  ortho Follows with Dr Johnsie Cancel of cardiology Sees [place] for colonoscopy, last colonoscopy in 2013   ? Mitral valve prolapse   ? Neck pain 05/03/2015  ? Preventative health care 03/16/2014  ? 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 s/p excision  ? Syncope 02/03/2013  ? 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  ? ? ?Past Surgical History:  ?Procedure Laterality Date  ? APPENDECTOMY  05/2011  ? CARDIAC CATHETERIZATION  01/03/12  ? 30% ostial LAD stenosis, ostial 30-40% D1 stenosis, smooth and normal left main and RCA; LVEF 65%  ? CHOLECYSTECTOMY N/A 08/25/2014  ? Procedure: LAPAROSCOPIC CHOLECYSTECTOMY WITH INTRAOPERATIVE CHOLANGIOGRAM;  Surgeon: [name], MD;  Location: [facility];  Service: General;  Laterality: N/A;  ? INGUINAL HERNIA REPAIR  07/2010  ? right  ? KNEE ARTHROPLASTY    ? bilat  ? LEFT HEART CATHETERIZATION WITH CORONARY ANGIOGRAM N/A 01/03/2012  ? Procedure: LEFT HEART CATHETERIZATION WITH CORONARY ANGIOGRAM;  Surgeon: [name], MD;  Location: [facility] CATH LAB;  Service: Cardiovascular;  Laterality: N/A;  ? MRI  10/31/2016  ? TONSILLECTOMY  1947  ? tumor removal  1958  ? "fatty tumor"  ? ? ?Current Medications: ?No outpatient medications have been marked as taking for the 09/28/21 encounter (Appointment) with Freada Bergeron, MD.  ?  ? ?Allergies:   Sulfa antibiotics  ? ?Social History  ? ?Socioeconomic History  ? Marital status:  Single  ?  Spouse name: Not on file  ? Number of children: Not on file  ? Years of education: Not on file  ? Highest education level: Not on file  ?Occupational History  ? 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 Smoking status: Never  ? Smokeless tobacco: Never  ?Vaping Use  ? Vaping Use: Never used  ?Substance and Sexual Activity  ? Alcohol use: Yes  ?  Comment: 2 glasses red wine/day  ? Drug use: No  ? Sexual activity: Yes  ?Other Topics Concern  ? Not on file  ?Social History  Narrative  ? Not on file  ? ?Social Determinants of Health  ? ?Financial Resource Strain: Low Risk   ? Difficulty of Paying Living Expenses: Not hard at all  ?Food Insecurity: No Food Insecurity  ? Worried About Charity fundraiser in the Last Year: Never true  ? Ran Out of Food in the Last Year: Never true  ?Transportation Needs: No Transportation Needs  ? Lack of Transportation (Medical): No  ? Lack of Transportation (Non-Medical): No  ?Physical Activity: Sufficiently Active  ? Days of Exercise per Week: 4 days  ? Minutes of Exercise per Session: 60 min  ?Stress: No Stress Concern Present  ? Feeling of Stress : Not at all  ?Social Connections: Not on file  ?  ? ?Family History: ?The patient's family history includes Cancer in his paternal uncle and sister; Heart disease in his maternal grandfather, maternal grandmother, and mother; Hypertension in his mother; Lung disease in his father; Stroke in his mother. ? ?ROS:   ?Please see the history of present illness.    ?Review of Systems  ?Constitutional:  Negative for chills, fever and weight loss.  ?HENT:  Negative for nosebleeds.   ?Eyes:  Negative for blurred vision.  ?Respiratory:  Negative for shortness of breath.   ?Cardiovascular:  Negative for chest pain, palpitations, orthopnea, claudication, leg swelling and PND.  ?Gastrointestinal:  Negative for heartburn, nausea and vomiting.  ?Genitourinary:  Negative for frequency.  ?Musculoskeletal:  Positive for myalgias.  ?Neurological:  Negative for dizziness and loss of consciousness.  ?Endo/Heme/Allergies:  Negative for polydipsia.  ?Psychiatric/Behavioral:  Negative for substance abuse.   ? ?EKGs/Labs/Other Studies Reviewed:   ? ?The following studies were reviewed today: ?Myoview 03/19/20: ?The left ventricular ejection fraction is normal (55-65%). ?Nuclear stress EF: 63%. ?There was no ST segment deviation noted during stress. ?The study is normal. ?This is a low risk study. ?  ?Normal pharmacologic nuclear  stress test with no evidence for prior infarct or ischemia. Normal LVEF. ? ?TTE 04/01/20: ?IMPRESSIONS  ? 1. Left ventricular ejection fraction, by estimation, is 60 to 65%. The  ?left ventricle has normal function. The left ventricle has no regional  ?wall motion abnormalities. Left ventricular diastolic parameters are  ?indeterminate.  ? 2. Right ventricular systolic function is normal. The right ventricular  ?size is normal. There is normal pulmonary artery systolic pressure.  ? 3. Left atrial size was mildly dilated.  ? 4. Right atrial size was mildly dilated.  ? 5. The mitral valve is grossly normal. Trivial mitral valve  ?regurgitation.  ? 6. The aortic valve is normal in structure. Aortic valve regurgitation is  ?mild. No aortic stenosis is present.  ? 7. Aortic dilatation noted. 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 ? ?Abdominal Ultrasound 04/01/20: ?Summary:  ?Abdominal Aorta: No evidence of an abdominal  aortic aneurysm was  ?visualized. The largest aortic measurement is 2.6 cm. The largest aortic  ?diameter has decreased compared to prior exam. Previous diameter  ?measurement was 3.0 cm obtained on 2016.  ? ?IVC/Iliac: There is no evidence of thrombus involving the IVC. ? ?Carotid ultrasound 01/2013 ?1-39% bilteral stenosis ? ?Recent Labs: ?07/15/2021: ALT 14; BUN 9; Creatinine, Ser 0.94; Hemoglobin 13.7; Platelets 200.0; Potassium 4.0; Sodium 135; TSH 2.97  ?Recent Lipid Panel ?   ?Component Value Date/Time  ? CHOL 125 07/15/2021 0829  ? TRIG 71.0 07/15/2021 0829  ? HDL 57.80 07/15/2021 0829  ? CHOLHDL 2 07/15/2021 0829  ? VLDL 14.2 07/15/2021 0829  ? LDLCALC 53 07/15/2021 0829  ? LDLCALC 46 07/09/2020 1526  ? ? ? ? ?Physical Exam:   ? ?VS:  There were no vitals taken for this visit.   ? ?Wt Readings from Last 3 Encounters:  ?09/23/21 155 lb (70.3 kg)  ?09/06/21 156 lb 3.2 oz (70.9 kg)  ?06/16/21 154 lb (69.9 kg)  ?  ? ?GEN:  Well nourished, well developed in no  acute distress ?HEENT: Normal ?NECK: No JVD; No carotid bruits ?CARDIAC: Bradycardic, regular, no murmurs, rubs, gallops ?RESPIRATORY:  Clear to auscultation without rales, wheezing or rhonchi  ?ABDOMEN: Soft, no

## 2021-09-28 ENCOUNTER — Ambulatory Visit (INDEPENDENT_AMBULATORY_CARE_PROVIDER_SITE_OTHER): Payer: Medicare Other | Admitting: Cardiology

## 2021-09-28 ENCOUNTER — Encounter (HOSPITAL_COMMUNITY): Payer: Self-pay

## 2021-09-28 ENCOUNTER — Ambulatory Visit (INDEPENDENT_AMBULATORY_CARE_PROVIDER_SITE_OTHER): Payer: Medicare Other

## 2021-09-28 ENCOUNTER — Encounter: Payer: Self-pay | Admitting: Cardiology

## 2021-09-28 VITALS — BP 118/60 | HR 61 | Ht 70.0 in | Wt 152.0 lb

## 2021-09-28 DIAGNOSIS — R42 Dizziness and giddiness: Secondary | ICD-10-CM

## 2021-09-28 DIAGNOSIS — I77819 Aortic ectasia, unspecified site: Secondary | ICD-10-CM

## 2021-09-28 DIAGNOSIS — I2510 Atherosclerotic heart disease of native coronary artery without angina pectoris: Secondary | ICD-10-CM

## 2021-09-28 DIAGNOSIS — I6523 Occlusion and stenosis of bilateral carotid arteries: Secondary | ICD-10-CM | POA: Diagnosis not present

## 2021-09-28 DIAGNOSIS — I340 Nonrheumatic mitral (valve) insufficiency: Secondary | ICD-10-CM | POA: Diagnosis not present

## 2021-09-28 DIAGNOSIS — I2583 Coronary atherosclerosis due to lipid rich plaque: Secondary | ICD-10-CM | POA: Diagnosis not present

## 2021-09-28 LAB — EXERCISE TOLERANCE TEST
Angina Index: 0
Duke Treadmill Score: -1
Estimated workload: 10.7
Exercise duration (min): 9 min
Exercise duration (sec): 25 s
MPHR: 139 {beats}/min
Peak HR: 148 {beats}/min
Percent HR: 106 %
RPE: 15
Rest HR: 52 {beats}/min
ST Depression (mm): 2 mm

## 2021-09-28 NOTE — H&P (View-Only) (Signed)
?Cardiology Office Note:   ? ?Date:  09/28/2021  ? ?ID:  James [name], DOB Dec 05, 1940, MRN [id number] ? ?PCP:  [name], MD  ?[facility] Cardiologist:  None  ?[place] Electrophysiologist:  None  ? ?Referring MD: [name], MD  ? ? ?History of Present Illness:   ? ?James [name] is a 81 y.o. male with a hx of AAA, nonobstructive CAD, and HLD who presents to clinic for follow-up.  ?  ?Patient was in ED on 09/18/2 after running 5K where he developed significant lightheadedness.No LOC. Just felt weak and exhausted. No chest pain/pressure or SOB. Had diarrhea for several weeks prior leading up to the run having on average 6 episodes per day. ECG with NSR without ischemic changes.Trop 29-->32. Thought to be due to dehydration. ? ?He presented to Cardiology clinic on 03/17/20 for pre-operative clearance for colonoscopy. Myoview was negative for ischemia. EF normal. TTE with normal BiV function, no significant valvular abnormalities. Colonoscopy with lymphocytic colitis.  Polyp was benign. He was given medication to take for 3 months and his symptoms have since resolved.  ? ?Last seen in clinic on 05/2020 where he was doing very well from a CV standpoint. Remained very active. ? ?Today, the patient states he has begun to feel lightheaded with exertion. Specifically, he started power walking in July 2022 and started to feel lightheaded during exercise. His symptoms were mild but then progressively worsened since that time period such that he is often forced to get on his knees to recover to ensure he does not syncopize. He states that he remains hydrated and tries to eat before he works out. Notably he has cut back on carbohydrates significantly and has lost a mild amount of weight but believes he is getting adequate nutrition. He also takes several supplements which he has stopped about 4 days ago to see if his symptoms improve. Not on HTN medications or nodal agents. Takes tamsulosin but this is a  long term med. Has occasional chest discomfort that can occur at rest or with exertion. No SOB, orthopnea, PND.  States that his HR goes up to 150s with exertion but does not know his blood pressure. Currently in 110/60s in clinic ? ?He denies any palpitations, shortness of breath, or peripheral edema. No headaches, orthopnea, or PND.  ? ?Past Medical History:  ?Diagnosis Date  ? AAA (abdominal aortic aneurysm) without rupture ([place]) 09/08/2013  ? Abdominal bloating 05/19/2016  ? Anxiety state 05/18/2014  ? Atrial fibrillation ([place])   ? 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 Benign paroxysmal positional vertigo 04/06/2013  ? CAD (coronary artery disease)   ? nonobstructive by cath 2008; nonobstructive by Cardiac CT 11/2011  ? Cervical spine fracture ([place]) 1980  ? C6-7  ? Diverticulosis   ? Erectile dysfunction 09/08/2013  ? GERD (gastroesophageal reflux disease)   ? Hiatal hernia   ? History of atrial fibrillation   ? History of squamous cell carcinoma 10-05-10  ? Dr [name], [place]  ? HLD (hyperlipidemia)   ? Hypothyroidism   ? Medicare annual wellness visit, subsequent 03/16/2014  ? Sees [place] ortho Follows with Dr Johnsie Cancel of cardiology Sees [place] for colonoscopy, last colonoscopy in 2013   ? Mitral valve prolapse   ? Neck pain 05/03/2015  ? Preventative health care 03/16/2014  ? 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 s/p excision  ? Syncope 02/03/2013  ? 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  ? ? ?  Past Surgical History:  ?Procedure Laterality Date  ? APPENDECTOMY  05/2011  ? CARDIAC CATHETERIZATION  01/03/12  ? 30% ostial LAD stenosis, ostial 30-40% D1 stenosis, smooth and normal left main and RCA; LVEF 65%  ? CHOLECYSTECTOMY N/A 08/25/2014  ? Procedure: LAPAROSCOPIC CHOLECYSTECTOMY WITH INTRAOPERATIVE CHOLANGIOGRAM;  Surgeon: [name], MD;  Location: [facility];  Service: General;  Laterality: N/A;  ? INGUINAL HERNIA REPAIR  07/2010  ? right  ? KNEE ARTHROPLASTY    ? bilat  ? LEFT HEART  CATHETERIZATION WITH CORONARY ANGIOGRAM N/A 01/03/2012  ? Procedure: LEFT HEART CATHETERIZATION WITH CORONARY ANGIOGRAM;  Surgeon: [name], MD;  Location: [facility] CATH LAB;  Service: Cardiovascular;  Laterality: N/A;  ? MRI  10/31/2016  ? TONSILLECTOMY  1947  ? tumor removal  1958  ? "fatty tumor"  ? ? ?Current Medications: ?Current Meds  ?Medication Sig  ? ASHWAGANDHA PO Take by mouth daily.  ? atorvastatin (LIPITOR) 20 MG tablet Take 1 tablet (20 mg total) by mouth daily.  ? budesonide (ENTOCORT EC) 3 MG 24 hr capsule Take 9 mg by mouth every morning.  ? Calcium Carb-Cholecalciferol (CALCIUM 1000 + D PO) Take 1 tablet by mouth 3 (three) times a week.   ? Cyanocobalamin (VITAMIN B 12) 500 MCG TABS Take 1 tablet by mouth daily.  ? diclofenac Sodium (VOLTAREN) 1 % GEL Apply 4 g topically 4 (four) times daily. To affected joint.  ? Dutasteride-Tamsulosin HCl 0.5-0.4 MG CAPS TAKE 1 CAPSULE BY MOUTH AT BEDTIME  ? ELDERBERRY PO Take 50 mg by mouth. 3 times per week  ? levothyroxine (SYNTHROID) 100 MCG tablet TAKE 1 TABLET BY MOUTH EVERY MORNING BEFORE BREAKFAST  ? LORazepam (ATIVAN) 0.5 MG tablet TAKE 1 TABLET BY MOUTH TWICE DAILY AS NEEDED FOR ANXIETY  ? Melatonin 10 MG TABS Take 1 tablet by mouth at bedtime as needed.  ? Milk Thistle 1000 MG CAPS Take 1 capsule by mouth daily.  ? mirtazapine (REMERON) 15 MG tablet TAKE 1 TABLET BY MOUTH AT BEDTIME  ? Multiple Vitamins-Minerals (MULTIVITAMINS THER. W/MINERALS) TABS Take 1 tablet by mouth daily.  ? pantoprazole (PROTONIX) 40 MG tablet TAKE 1 TABLET BY MOUTH 2 TIMES A DAY BEFORE MEALS FOR ACID REFLUX  ? Probiotic Product (PROBIOTIC DAILY PO) Take by mouth.  ? sildenafil (REVATIO) 20 MG tablet TAKE 1 TO 5 TABLETS BY MOUTH EVERY DAY AS NEEDED  ? Testosterone 30 MG/ACT SOLN Apply 1 pump under each arm every morning as directed  ? TURMERIC CURCUMIN PO Take 1 tablet by mouth daily.  ? zolpidem (AMBIEN) 10 MG tablet Take 1 tablet (10 mg total) by mouth at bedtime as needed.  for sleep  ?  ? ?Allergies:   Sulfa antibiotics  ? ?Social History  ? ?Socioeconomic History  ? Marital status: Single  ?  Spouse name: Not on file  ? Number of children: Not on file  ? Years of education: Not on file  ? Highest education level: Not on file  ?Occupational History  ? 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 Smoking status: Never  ? Smokeless tobacco: Never  ?Vaping Use  ? Vaping Use: Never used  ?Substance and Sexual Activity  ? Alcohol use: Yes  ?  Comment: 2 glasses red wine/day  ? Drug use: No  ? Sexual activity: Yes  ?Other Topics Concern  ? Not on file  ?Social History Narrative  ? Not on file  ? ?Social Determinants of  Health  ? ?Financial Resource Strain: Low Risk   ? Difficulty of Paying Living Expenses: Not hard at all  ?Food Insecurity: No Food Insecurity  ? Worried About Charity fundraiser in the Last Year: Never true  ? Ran Out of Food in the Last Year: Never true  ?Transportation Needs: No Transportation Needs  ? Lack of Transportation (Medical): No  ? Lack of Transportation (Non-Medical): No  ?Physical Activity: Sufficiently Active  ? Days of Exercise per Week: 4 days  ? Minutes of Exercise per Session: 60 min  ?Stress: No Stress Concern Present  ? Feeling of Stress : Not at all  ?Social Connections: Not on file  ?  ? ?Family History: ?The patient's family history includes Cancer in his paternal uncle and sister; Heart disease in his maternal grandfather, maternal grandmother, and mother; Hypertension in his mother; Lung disease in his father; Stroke in his mother. ? ?ROS:   ?Please see the history of present illness.    ?Review of Systems  ?Constitutional:  Negative for chills, fever and weight loss.  ?HENT:  Negative for nosebleeds.   ?Eyes:  Negative for blurred vision.  ?Respiratory:  Negative for shortness of breath.   ?Cardiovascular:  Positive for chest pain. Negative for palpitations, orthopnea, claudication, leg swelling and PND.  ?Gastrointestinal:  Negative  for heartburn, nausea and vomiting.  ?Genitourinary:  Negative for frequency.  ?Musculoskeletal:  Negative for falls and myalgias.  ?Neurological:  Positive for dizziness. Negative for loss of consciousn

## 2021-09-28 NOTE — Patient Instructions (Addendum)
Medication Instructions:  ?Your physician recommends that you continue on your current medications as directed. Please refer to the Current Medication list given to you today. ? ?*If you need a refill on your cardiac medications before your next appointment, please call your pharmacy* ? ? ?Lab Work: ?None ordered  ? ?If you have labs (blood work) drawn today and your tests are completely normal, you will receive your results only by: ?MyChart Message (if you have MyChart) OR ?A paper copy in the mail ?If you have any lab test that is abnormal or we need to change your treatment, we will call you to review the results. ? ? ?Testing/Procedures: ?Your physician has requested that you have an exercise tolerance test. For further information please visit [email or web address]. Please also follow instruction sheet, as given. ? ?Your physician has ordered for you you to have a cardiac pet scan  ? ?Follow-Up: ?At [facility], you and your health needs are our priority.  As part of our continuing mission to provide you with exceptional heart care, we have created designated Provider Care Teams.  These Care Teams include your primary Cardiologist (physician) and Advanced Practice Providers (APPs -  Physician Assistants and Nurse Practitioners) who all work together to provide you with the care you need, when you need it. ? ?We recommend signing up for the patient portal called "MyChart".  Sign up information is provided on this After Visit Summary.  MyChart is used to connect with patients for Virtual Visits (Telemedicine).  Patients are able to view lab/test results, encounter notes, upcoming appointments, etc.  Non-urgent messages can be sent to your provider as well.   ?To learn more about what you can do with MyChart, go to [email or web address].   ? ?Your next appointment:   ?6 month(s) ? ?The format for your next appointment:   ?In Person ? ?Provider:   ? Gwyndolyn Kaufman, MD ? ? ?Other Instructions ? ? ?Important  Information About Sugar ? ? ? ? ?  ?

## 2021-09-28 NOTE — Progress Notes (Signed)
?Cardiology Office Note:   ? ?Date:  09/28/2021  ? ?ID:  TRASEAN [name], DOB 01-28-1941, MRN [id number] ? ?PCP:  [name], MD  ?[facility] Cardiologist:  None  ?[name] Electrophysiologist:  None  ? ?Referring MD: [name], MD  ? ? ?History of Present Illness:   ? ?James [name] is a 81 y.o. male with a hx of AAA, nonobstructive CAD, and HLD who presents to clinic for follow-up.  ?  ?Patient was in ED on 09/18/2 after running 5K where he developed significant lightheadedness.No LOC. Just felt weak and exhausted. No chest pain/pressure or SOB. Had diarrhea for several weeks prior leading up to the run having on average 6 episodes per day. ECG with NSR without ischemic changes.Trop 29-->32. Thought to be due to dehydration. ? ?He presented to Cardiology clinic on 03/17/20 for pre-operative clearance for colonoscopy. Myoview was negative for ischemia. EF normal. TTE with normal BiV function, no significant valvular abnormalities. Colonoscopy with lymphocytic colitis.  Polyp was benign. He was given medication to take for 3 months and his symptoms have since resolved.  ? ?Last seen in clinic on 05/2020 where he was doing very well from a CV standpoint. Remained very active. ? ?Today, the patient states he has begun to feel lightheaded with exertion. Specifically, he started power walking in July 2022 and started to feel lightheaded during exercise. His symptoms were mild but then progressively worsened since that time period such that he is often forced to get on his knees to recover to ensure he does not syncopize. He states that he remains hydrated and tries to eat before he works out. Notably he has cut back on carbohydrates significantly and has lost a mild amount of weight but believes he is getting adequate nutrition. He also takes several supplements which he has stopped about 4 days ago to see if his symptoms improve. Not on HTN medications or nodal agents. Takes tamsulosin but this is a  long term med. Has occasional chest discomfort that can occur at rest or with exertion. No SOB, orthopnea, PND.  States that his HR goes up to 150s with exertion but does not know his blood pressure. Currently in 110/60s in clinic ? ?He denies any palpitations, shortness of breath, or peripheral edema. No headaches, orthopnea, or PND.  ? ?Past Medical History:  ?Diagnosis Date  ? AAA (abdominal aortic aneurysm) without rupture ([place]) 09/08/2013  ? Abdominal bloating 05/19/2016  ? Anxiety state 05/18/2014  ? Atrial fibrillation ([place])   ? 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 Benign paroxysmal positional vertigo 04/06/2013  ? CAD (coronary artery disease)   ? nonobstructive by cath 2008; nonobstructive by Cardiac CT 11/2011  ? Cervical spine fracture ([place]) 1980  ? C6-7  ? Diverticulosis   ? Erectile dysfunction 09/08/2013  ? GERD (gastroesophageal reflux disease)   ? Hiatal hernia   ? History of atrial fibrillation   ? History of squamous cell carcinoma 10-05-10  ? Dr [name], [place]  ? HLD (hyperlipidemia)   ? Hypothyroidism   ? Medicare annual wellness visit, subsequent 03/16/2014  ? Sees [name] ortho Follows with Dr Johnsie Cancel of cardiology Sees [name] for colonoscopy, last colonoscopy in 2013   ? Mitral valve prolapse   ? Neck pain 05/03/2015  ? Preventative health care 03/16/2014  ? 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 s/p excision  ? Syncope 02/03/2013  ? 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  ? ? ?  Past Surgical History:  ?Procedure Laterality Date  ? APPENDECTOMY  05/2011  ? CARDIAC CATHETERIZATION  01/03/12  ? 30% ostial LAD stenosis, ostial 30-40% D1 stenosis, smooth and normal left main and RCA; LVEF 65%  ? CHOLECYSTECTOMY N/A 08/25/2014  ? Procedure: LAPAROSCOPIC CHOLECYSTECTOMY WITH INTRAOPERATIVE CHOLANGIOGRAM;  Surgeon: [name], MD;  Location: [place];  Service: General;  Laterality: N/A;  ? INGUINAL HERNIA REPAIR  07/2010  ? right  ? KNEE ARTHROPLASTY    ? bilat  ? LEFT HEART  CATHETERIZATION WITH CORONARY ANGIOGRAM N/A 01/03/2012  ? Procedure: LEFT HEART CATHETERIZATION WITH CORONARY ANGIOGRAM;  Surgeon: [name], MD;  Location: [facility] CATH LAB;  Service: Cardiovascular;  Laterality: N/A;  ? MRI  10/31/2016  ? TONSILLECTOMY  1947  ? tumor removal  1958  ? "fatty tumor"  ? ? ?Current Medications: ?Current Meds  ?Medication Sig  ? ASHWAGANDHA PO Take by mouth daily.  ? atorvastatin (LIPITOR) 20 MG tablet Take 1 tablet (20 mg total) by mouth daily.  ? budesonide (ENTOCORT EC) 3 MG 24 hr capsule Take 9 mg by mouth every morning.  ? Calcium Carb-Cholecalciferol (CALCIUM 1000 + D PO) Take 1 tablet by mouth 3 (three) times a week.   ? Cyanocobalamin (VITAMIN B 12) 500 MCG TABS Take 1 tablet by mouth daily.  ? diclofenac Sodium (VOLTAREN) 1 % GEL Apply 4 g topically 4 (four) times daily. To affected joint.  ? Dutasteride-Tamsulosin HCl 0.5-0.4 MG CAPS TAKE 1 CAPSULE BY MOUTH AT BEDTIME  ? ELDERBERRY PO Take 50 mg by mouth. 3 times per week  ? levothyroxine (SYNTHROID) 100 MCG tablet TAKE 1 TABLET BY MOUTH EVERY MORNING BEFORE BREAKFAST  ? LORazepam (ATIVAN) 0.5 MG tablet TAKE 1 TABLET BY MOUTH TWICE DAILY AS NEEDED FOR ANXIETY  ? Melatonin 10 MG TABS Take 1 tablet by mouth at bedtime as needed.  ? Milk Thistle 1000 MG CAPS Take 1 capsule by mouth daily.  ? mirtazapine (REMERON) 15 MG tablet TAKE 1 TABLET BY MOUTH AT BEDTIME  ? Multiple Vitamins-Minerals (MULTIVITAMINS THER. W/MINERALS) TABS Take 1 tablet by mouth daily.  ? pantoprazole (PROTONIX) 40 MG tablet TAKE 1 TABLET BY MOUTH 2 TIMES A DAY BEFORE MEALS FOR ACID REFLUX  ? Probiotic Product (PROBIOTIC DAILY PO) Take by mouth.  ? sildenafil (REVATIO) 20 MG tablet TAKE 1 TO 5 TABLETS BY MOUTH EVERY DAY AS NEEDED  ? Testosterone 30 MG/ACT SOLN Apply 1 pump under each arm every morning as directed  ? TURMERIC CURCUMIN PO Take 1 tablet by mouth daily.  ? zolpidem (AMBIEN) 10 MG tablet Take 1 tablet (10 mg total) by mouth at bedtime as needed.  for sleep  ?  ? ?Allergies:   Sulfa antibiotics  ? ?Social History  ? ?Socioeconomic History  ? Marital status: Single  ?  Spouse name: Not on file  ? Number of children: Not on file  ? Years of education: Not on file  ? Highest education level: Not on file  ?Occupational History  ? 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 Smoking status: Never  ? Smokeless tobacco: Never  ?Vaping Use  ? Vaping Use: Never used  ?Substance and Sexual Activity  ? Alcohol use: Yes  ?  Comment: 2 glasses red wine/day  ? Drug use: No  ? Sexual activity: Yes  ?Other Topics Concern  ? Not on file  ?Social History Narrative  ? Not on file  ? ?Social Determinants of  Health  ? ?Financial Resource Strain: Low Risk   ? Difficulty of Paying Living Expenses: Not hard at all  ?Food Insecurity: No Food Insecurity  ? Worried About Charity fundraiser in the Last Year: Never true  ? Ran Out of Food in the Last Year: Never true  ?Transportation Needs: No Transportation Needs  ? Lack of Transportation (Medical): No  ? Lack of Transportation (Non-Medical): No  ?Physical Activity: Sufficiently Active  ? Days of Exercise per Week: 4 days  ? Minutes of Exercise per Session: 60 min  ?Stress: No Stress Concern Present  ? Feeling of Stress : Not at all  ?Social Connections: Not on file  ?  ? ?Family History: ?The patient's family history includes Cancer in his paternal uncle and sister; Heart disease in his maternal grandfather, maternal grandmother, and mother; Hypertension in his mother; Lung disease in his father; Stroke in his mother. ? ?ROS:   ?Please see the history of present illness.    ?Review of Systems  ?Constitutional:  Negative for chills, fever and weight loss.  ?HENT:  Negative for nosebleeds.   ?Eyes:  Negative for blurred vision.  ?Respiratory:  Negative for shortness of breath.   ?Cardiovascular:  Positive for chest pain. Negative for palpitations, orthopnea, claudication, leg swelling and PND.  ?Gastrointestinal:  Negative  for heartburn, nausea and vomiting.  ?Genitourinary:  Negative for frequency.  ?Musculoskeletal:  Negative for falls and myalgias.  ?Neurological:  Positive for dizziness. Negative for loss of consciousn

## 2021-09-29 ENCOUNTER — Other Ambulatory Visit: Payer: Self-pay

## 2021-09-29 ENCOUNTER — Other Ambulatory Visit: Payer: Medicare Other | Admitting: *Deleted

## 2021-09-29 ENCOUNTER — Ambulatory Visit: Payer: Medicare Other

## 2021-09-29 DIAGNOSIS — I2510 Atherosclerotic heart disease of native coronary artery without angina pectoris: Secondary | ICD-10-CM

## 2021-09-29 DIAGNOSIS — R42 Dizziness and giddiness: Secondary | ICD-10-CM | POA: Diagnosis not present

## 2021-09-29 DIAGNOSIS — I2583 Coronary atherosclerosis due to lipid rich plaque: Secondary | ICD-10-CM | POA: Diagnosis not present

## 2021-09-29 LAB — BASIC METABOLIC PANEL
BUN/Creatinine Ratio: 14 (ref 10–24)
BUN: 12 mg/dL (ref 8–27)
CO2: 30 mmol/L — ABNORMAL HIGH (ref 20–29)
Calcium: 9.7 mg/dL (ref 8.6–10.2)
Chloride: 98 mmol/L (ref 96–106)
Creatinine, Ser: 0.87 mg/dL (ref 0.76–1.27)
Glucose: 117 mg/dL — ABNORMAL HIGH (ref 70–99)
Potassium: 4.4 mmol/L (ref 3.5–5.2)
Sodium: 136 mmol/L (ref 134–144)
eGFR: 87 mL/min/{1.73_m2} (ref >59)

## 2021-09-29 LAB — CBC
Hematocrit: 42.9 % (ref 37.5–51.0)
Hemoglobin: 14.8 g/dL (ref 13.0–17.7)
MCH: 32 pg (ref 26.6–33.0)
MCHC: 34.5 g/dL (ref 31.5–35.7)
MCV: 93 fL (ref 79–97)
Platelets: 207 10*3/uL (ref 150–450)
RBC: 4.62 x10E6/uL (ref 4.14–5.80)
RDW: 13.1 % (ref 11.6–15.4)
WBC: 6 10*3/uL (ref 3.4–10.8)

## 2021-09-29 NOTE — Progress Notes (Signed)
CBC and BMP ordered for 10/01/21 Left heart cath.   ?

## 2021-09-29 NOTE — Telephone Encounter (Signed)
Called pt in regards to MD request for [facility].  Pt agreeable to have procedure as soon as possible.  Pt scheduled for 10/01/21 with Dr. Tamala Julian.  Reviewed instructions with pt over the phone.  Also, left letter at front desk for pt to pick up when comes to office today for pre-op labs.  EKG completed at 09/27/21 OV.  Pt had no questions or concerns.  ?

## 2021-09-29 NOTE — Progress Notes (Signed)
Heart Cath Instructions.  ?

## 2021-09-29 NOTE — Telephone Encounter (Signed)
Called and spoke to the patient extensively about his exercise stress test results. He had both ST depressions in the anterior leads as well as runs of atach (?afib) and PVCs at peak exercise. Both could be contributing to his lightheadedness with exertion. 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 He notably has history of ostial LAD disease on cath in 2009 so we discussed that he has the possibility of requiring CABG. He cannot tolerate BB at this time due to baseline bradycardia with HR as low as 40s. Asked him to refrain from formal exercise until we have the cath results.  ? ?INFORMED CONSENT: ?I have reviewed the risks, indications, and alternatives to cardiac catheterization, possible angioplasty, and stenting with the patient. Risks include but are not limited to bleeding, infection, vascular injury, stroke, myocardial infection, arrhythmia, kidney injury, radiation-related injury in the case of prolonged fluoroscopy use, emergency cardiac surgery, and death. The patient understands the risks of serious complication is 1-2 in 0737 with diagnostic cardiac cath and 1-2% or less with angioplasty/stenting.   ? ?Gwyndolyn Kaufman, MD ?

## 2021-10-01 ENCOUNTER — Encounter (HOSPITAL_COMMUNITY)
Admission: RE | Disposition: A | Discharge status: Home / Self Care | Payer: Self-pay | Source: Home / Self Care | Attending: Interventional Cardiology

## 2021-10-01 ENCOUNTER — Other Ambulatory Visit: Payer: Self-pay

## 2021-10-01 ENCOUNTER — Ambulatory Visit (HOSPITAL_COMMUNITY)
Admission: RE | Admit: 2021-10-01 | Discharge: 2021-10-01 | Disposition: A | Discharge status: Home / Self Care | Payer: Medicare Other | Attending: Interventional Cardiology | Admitting: Interventional Cardiology

## 2021-10-01 ENCOUNTER — Encounter (HOSPITAL_COMMUNITY): Payer: Self-pay | Admitting: Interventional Cardiology

## 2021-10-01 DIAGNOSIS — R9439 Abnormal result of other cardiovascular function study: Secondary | ICD-10-CM

## 2021-10-01 DIAGNOSIS — I2583 Coronary atherosclerosis due to lipid rich plaque: Secondary | ICD-10-CM

## 2021-10-01 DIAGNOSIS — I714 Abdominal aortic aneurysm, without rupture, unspecified: Secondary | ICD-10-CM | POA: Diagnosis not present

## 2021-10-01 DIAGNOSIS — R42 Dizziness and giddiness: Secondary | ICD-10-CM | POA: Diagnosis not present

## 2021-10-01 DIAGNOSIS — R0789 Other chest pain: Secondary | ICD-10-CM | POA: Diagnosis not present

## 2021-10-01 DIAGNOSIS — I2510 Atherosclerotic heart disease of native coronary artery without angina pectoris: Secondary | ICD-10-CM | POA: Diagnosis not present

## 2021-10-01 DIAGNOSIS — Z7982 Long term (current) use of aspirin: Secondary | ICD-10-CM | POA: Diagnosis not present

## 2021-10-01 DIAGNOSIS — Z79899 Other long term (current) drug therapy: Secondary | ICD-10-CM | POA: Diagnosis not present

## 2021-10-01 DIAGNOSIS — I340 Nonrheumatic mitral (valve) insufficiency: Secondary | ICD-10-CM | POA: Insufficient documentation

## 2021-10-01 DIAGNOSIS — R079 Chest pain, unspecified: Secondary | ICD-10-CM | POA: Diagnosis present

## 2021-10-01 DIAGNOSIS — I6523 Occlusion and stenosis of bilateral carotid arteries: Secondary | ICD-10-CM | POA: Diagnosis not present

## 2021-10-01 HISTORY — PX: LEFT HEART CATH AND CORONARY ANGIOGRAPHY: CATH118249

## 2021-10-01 SURGERY — LEFT HEART CATH AND CORONARY ANGIOGRAPHY
Anesthesia: LOCAL

## 2021-10-01 MED ORDER — VERAPAMIL HCL 2.5 MG/ML IV SOLN
INTRAVENOUS | Status: DC | PRN
  Administered 2021-10-01: 10 mL via INTRA_ARTERIAL

## 2021-10-01 MED ORDER — IOHEXOL 350 MG/ML SOLN
INTRAVENOUS | Status: DC | PRN
  Administered 2021-10-01: 60 mL

## 2021-10-01 MED ORDER — SODIUM CHLORIDE 0.9% FLUSH: 3.0000 mL | Freq: Two times a day (BID) | INTRAVENOUS | Status: DC

## 2021-10-01 MED ORDER — HYDRALAZINE HCL 20 MG/ML IJ SOLN: 10.0000 mg | INTRAMUSCULAR | Status: DC | PRN

## 2021-10-01 MED ORDER — LIDOCAINE HCL (PF) 1 % IJ SOLN
INTRAMUSCULAR | Status: DC | PRN
  Administered 2021-10-01: 5 mL

## 2021-10-01 MED ORDER — MIDAZOLAM HCL 2 MG/2ML IJ SOLN
INTRAMUSCULAR | Status: DC | PRN
  Administered 2021-10-01: 1 mg via INTRAVENOUS

## 2021-10-01 MED ORDER — SODIUM CHLORIDE 0.9 % IV SOLN: INTRAVENOUS | Status: DC

## 2021-10-01 MED ORDER — VERAPAMIL HCL 2.5 MG/ML IV SOLN
INTRAVENOUS | Status: AC
  Filled 2021-10-01: qty 2

## 2021-10-01 MED ORDER — HEPARIN (PORCINE) IN NACL 1000-0.9 UT/500ML-% IV SOLN
INTRAVENOUS | Status: DC | PRN
  Administered 2021-10-01 (×2): 500 mL

## 2021-10-01 MED ORDER — ASPIRIN 81 MG PO CHEW: 81.0000 mg | CHEWABLE_TABLET | ORAL | Status: DC

## 2021-10-01 MED ORDER — HEPARIN SODIUM (PORCINE) 1000 UNIT/ML IJ SOLN
INTRAMUSCULAR | Status: AC
  Filled 2021-10-01: qty 10

## 2021-10-01 MED ORDER — HEPARIN (PORCINE) IN NACL 1000-0.9 UT/500ML-% IV SOLN
INTRAVENOUS | Status: AC
  Filled 2021-10-01: qty 1000

## 2021-10-01 MED ORDER — LIDOCAINE HCL (PF) 1 % IJ SOLN
INTRAMUSCULAR | Status: AC
  Filled 2021-10-01: qty 30

## 2021-10-01 MED ORDER — SODIUM CHLORIDE 0.9 % IV SOLN: 250.0000 mL | INTRAVENOUS | Status: DC | PRN

## 2021-10-01 MED ORDER — SODIUM CHLORIDE 0.9% FLUSH: 3.0000 mL | INTRAVENOUS | Status: DC | PRN

## 2021-10-01 MED ORDER — ACETAMINOPHEN 325 MG PO TABS: 650.0000 mg | ORAL_TABLET | ORAL | Status: DC | PRN

## 2021-10-01 MED ORDER — ONDANSETRON HCL 4 MG/2ML IJ SOLN: 4.0000 mg | Freq: Four times a day (QID) | INTRAMUSCULAR | Status: DC | PRN

## 2021-10-01 MED ORDER — FENTANYL CITRATE (PF) 100 MCG/2ML IJ SOLN
INTRAMUSCULAR | Status: DC | PRN
  Administered 2021-10-01: 25 ug via INTRAVENOUS

## 2021-10-01 MED ORDER — SODIUM CHLORIDE 0.9 % WEIGHT BASED INFUSION
3.0000 mL/kg/h | INTRAVENOUS | Status: AC
  Administered 2021-10-01: 3 mL/kg/h via INTRAVENOUS

## 2021-10-01 MED ORDER — SODIUM CHLORIDE 0.9 % WEIGHT BASED INFUSION: 1.0000 mL/kg/h | INTRAVENOUS | Status: DC

## 2021-10-01 MED ORDER — MIDAZOLAM HCL 2 MG/2ML IJ SOLN
INTRAMUSCULAR | Status: AC
  Filled 2021-10-01: qty 2

## 2021-10-01 MED ORDER — LABETALOL HCL 5 MG/ML IV SOLN: 10.0000 mg | INTRAVENOUS | Status: DC | PRN

## 2021-10-01 MED ORDER — FENTANYL CITRATE (PF) 100 MCG/2ML IJ SOLN
INTRAMUSCULAR | Status: AC
  Filled 2021-10-01: qty 2

## 2021-10-01 MED ORDER — HEPARIN SODIUM (PORCINE) 1000 UNIT/ML IJ SOLN
INTRAMUSCULAR | Status: DC | PRN
  Administered 2021-10-01: 3500 [IU] via INTRAVENOUS

## 2021-10-01 SURGICAL SUPPLY — 10 items
CATH 5FR JL3.5 JR4 ANG PIG MP (CATHETERS) ×1 IMPLANT
CATH 5FR JL4 DIAGNOSTIC (CATHETERS) ×1 IMPLANT
DEVICE RAD COMP TR BAND LRG (VASCULAR PRODUCTS) ×1 IMPLANT
GLIDESHEATH SLEND A-KIT 6F 22G (SHEATH) ×1 IMPLANT
GUIDEWIRE INQWIRE 1.5J.035X260 (WIRE) IMPLANT
INQWIRE 1.5J .035X260CM (WIRE) ×2
KIT HEART LEFT (KITS) ×2 IMPLANT
PACK CARDIAC CATHETERIZATION (CUSTOM PROCEDURE TRAY) ×2 IMPLANT
TRANSDUCER W/STOPCOCK (MISCELLANEOUS) ×2 IMPLANT
TUBING CIL FLEX 10 FLL-RA (TUBING) ×2 IMPLANT

## 2021-10-01 NOTE — Interval H&P Note (Signed)
Cath Lab Visit (complete for each Cath Lab visit) ? ?Clinical Evaluation Leading to the Procedure:  ? ?ACS: No. ? ?Non-ACS:   ? ?Anginal Classification: CCS II ? ?Anti-ischemic medical therapy: Minimal Therapy (1 class of medications) ? ?Non-Invasive Test Results: Intermediate-risk stress test findings: cardiac mortality 1-3%/year ? ?Prior CABG: No previous CABG ? ? ? ? ? ?History and Physical Interval Note: ? ?10/01/2021 ?9:00 AM ? ?James [name]  has presented today for surgery, with the diagnosis of abnormal stress test.  The various methods of treatment have been discussed with the patient and family. After consideration of risks, benefits and other options for treatment, the patient has consented to  Procedure(s): ?LEFT HEART CATH AND CORONARY ANGIOGRAPHY (N/A) as a surgical intervention.  The patient's history has been reviewed, patient examined, no change in status, stable for surgery.  I have reviewed the patient's chart and labs.  Questions were answered to the patient's satisfaction.   ? ? ?Belva Crome III ? ? ?

## 2021-10-01 NOTE — CV Procedure (Signed)
Right dominant coronary anatomy ?Marland KitchenWidely patent coronary arteries which are essentially angiographically normal. ?Minimal LAD calcification ?Normal LV function with normal contractility and EDP. ?Aorta is somewhat dilated.  Consider imaging if appropriate ?

## 2021-10-04 ENCOUNTER — Encounter: Payer: Self-pay | Admitting: Cardiology

## 2021-10-06 ENCOUNTER — Encounter: Payer: Medicare Other | Admitting: Physical Therapy

## 2021-10-06 ENCOUNTER — Encounter: Payer: Self-pay | Admitting: Cardiology

## 2021-10-06 ENCOUNTER — Ambulatory Visit: Payer: Medicare Other | Admitting: Physical Therapy

## 2021-10-06 ENCOUNTER — Other Ambulatory Visit: Payer: Self-pay | Admitting: Cardiology

## 2021-10-06 DIAGNOSIS — R002 Palpitations: Secondary | ICD-10-CM

## 2021-10-06 DIAGNOSIS — I428 Other cardiomyopathies: Secondary | ICD-10-CM

## 2021-10-06 IMAGING — DX DG CHEST 2V
2 series · 2 of 2 positions shown · non-contrast
Comparison: August 24, 2016

CLINICAL DATA: Chest pain

EXAM:
CHEST - 2 VIEW

[chest pa]
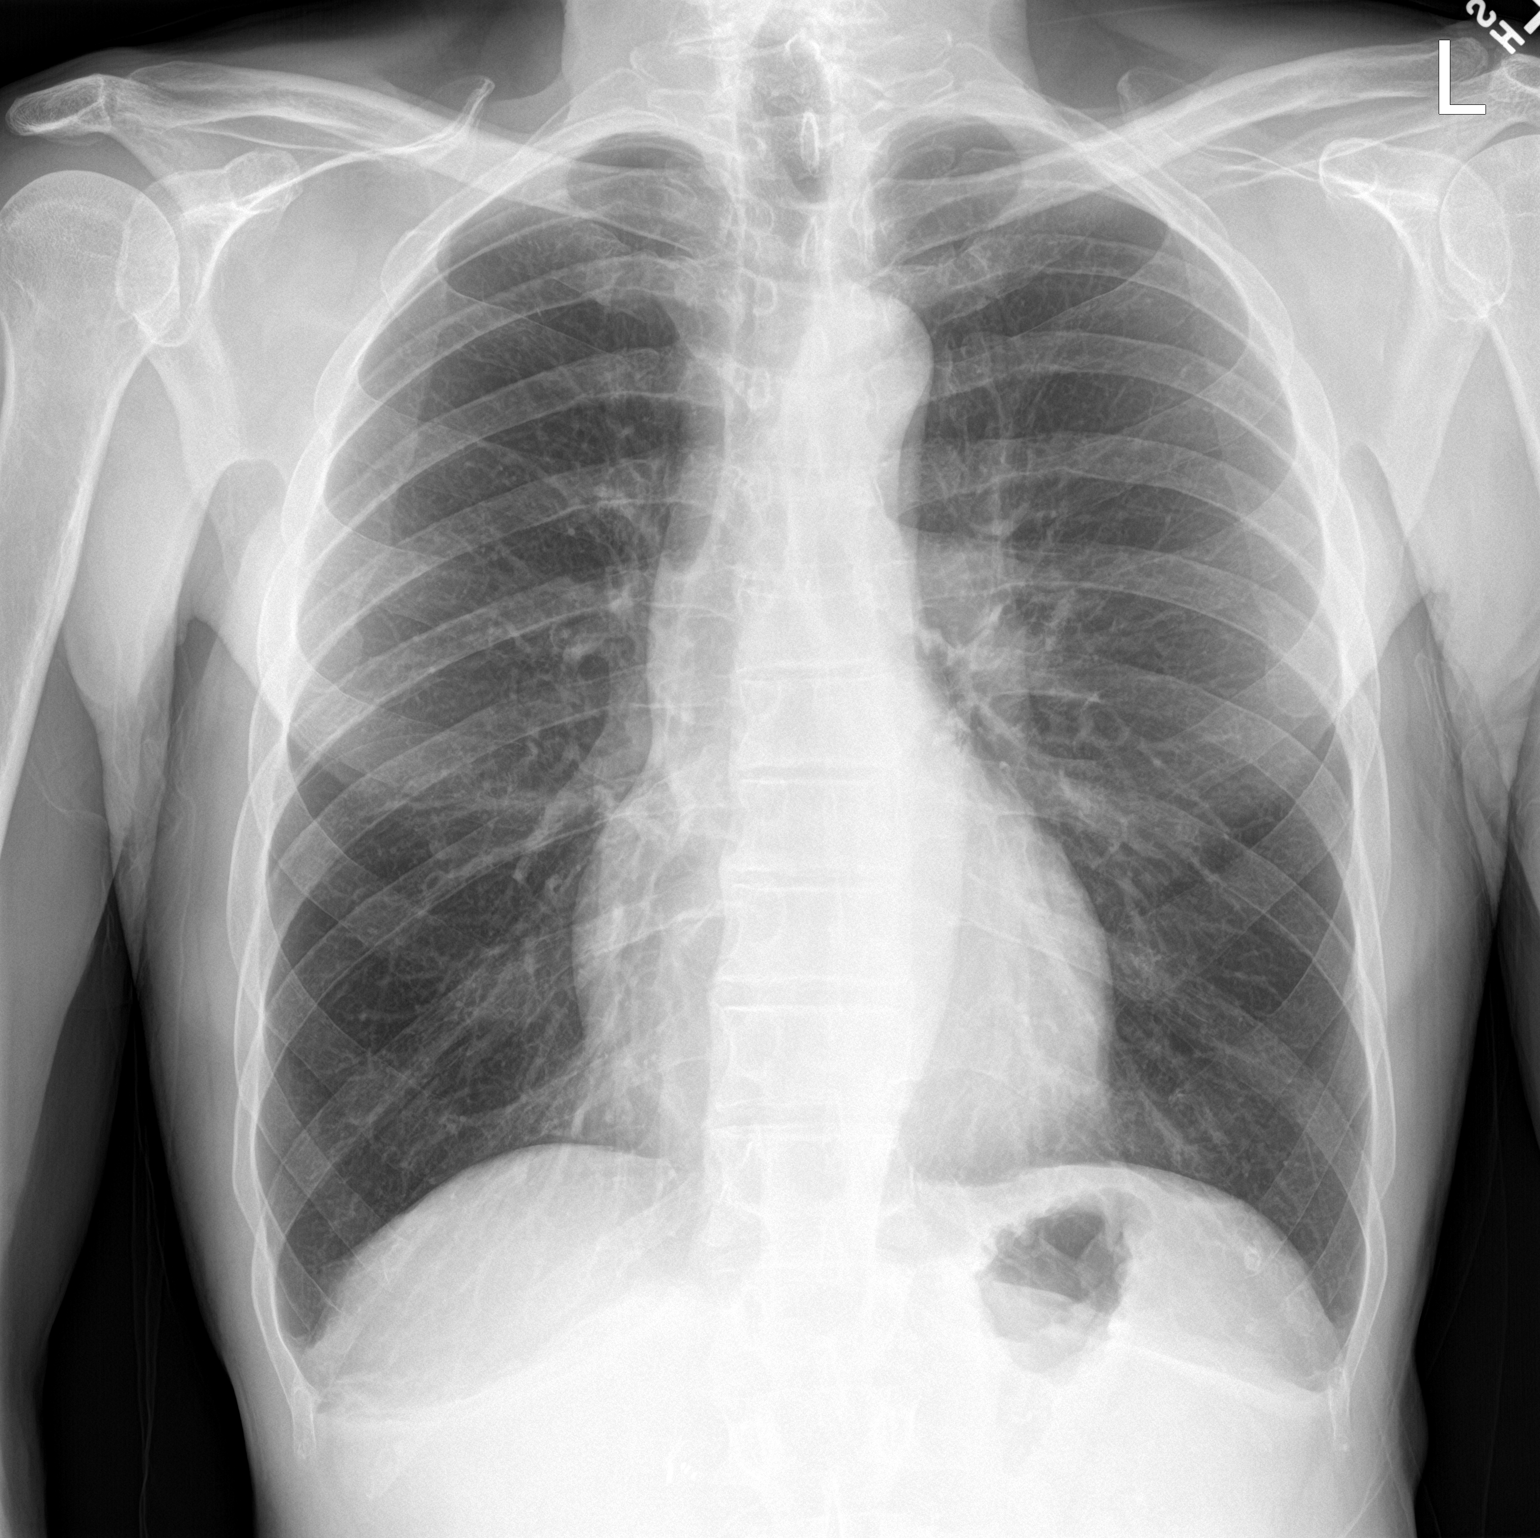

[chest lat]
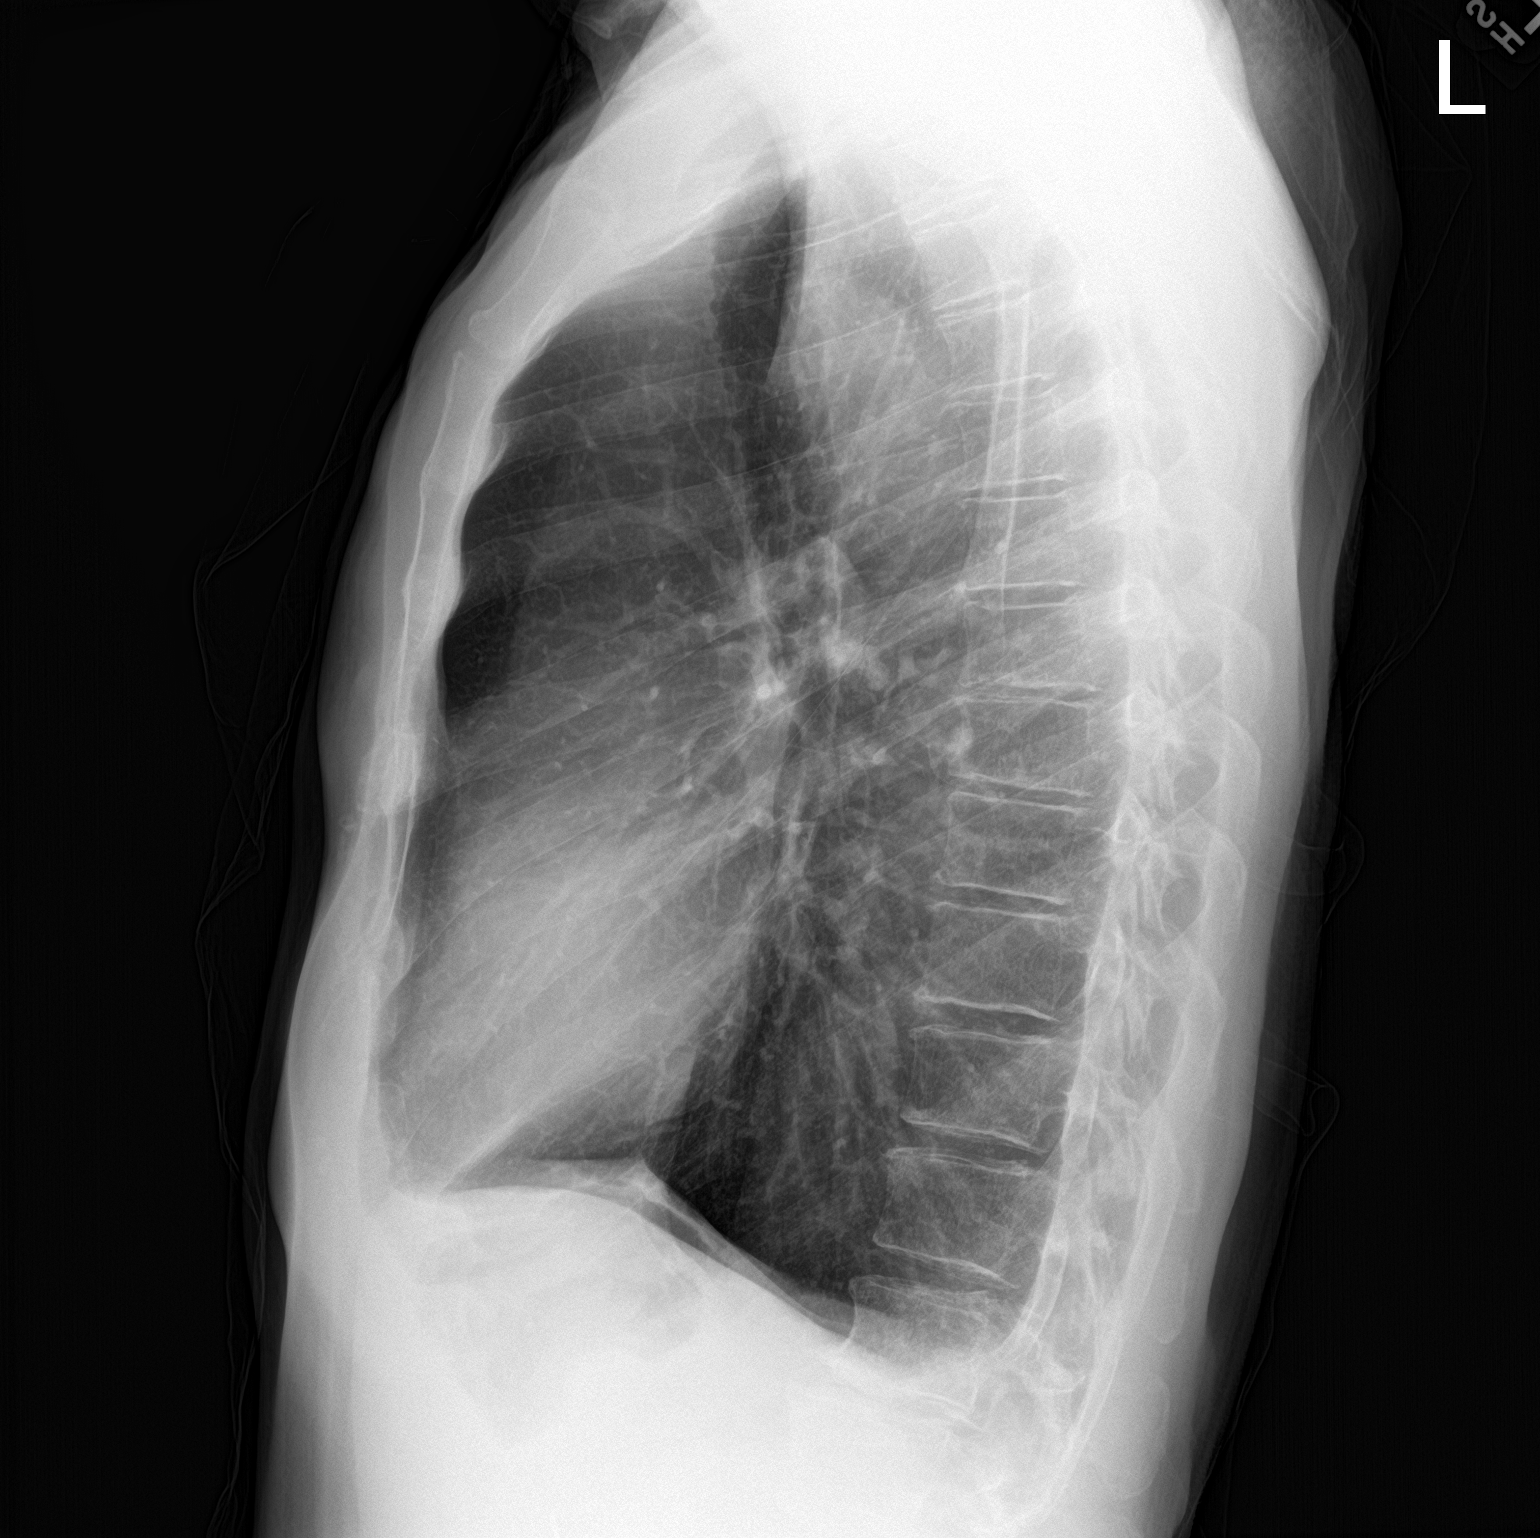

[2 of 2 positions shown; findings below may reference images not displayed]

FINDINGS: Lungs are clear. Heart size and pulmonary vascularity are normal. No
adenopathy. No pneumothorax. No bone lesions.
IMPRESSION: Lungs clear.  Cardiac silhouette within normal limits.

## 2021-10-06 MED ORDER — ISOSORBIDE MONONITRATE ER 30 MG PO TB24: 15.0000 mg | ORAL_TABLET | Freq: Every day | ORAL | 3 refills | Status: DC

## 2021-10-06 MED ORDER — METOPROLOL SUCCINATE ER 25 MG PO TB24: 12.5000 mg | ORAL_TABLET | Freq: Every evening | ORAL | 3 refills | Status: DC

## 2021-10-07 ENCOUNTER — Other Ambulatory Visit: Payer: Self-pay | Admitting: Cardiology

## 2021-10-07 ENCOUNTER — Ambulatory Visit (INDEPENDENT_AMBULATORY_CARE_PROVIDER_SITE_OTHER): Payer: Medicare Other

## 2021-10-07 ENCOUNTER — Other Ambulatory Visit (HOSPITAL_COMMUNITY): Payer: Self-pay | Admitting: Cardiology

## 2021-10-07 ENCOUNTER — Encounter: Payer: Self-pay | Admitting: *Deleted

## 2021-10-07 DIAGNOSIS — R002 Palpitations: Secondary | ICD-10-CM

## 2021-10-07 DIAGNOSIS — I77810 Thoracic aortic ectasia: Secondary | ICD-10-CM

## 2021-10-07 DIAGNOSIS — I7789 Other specified disorders of arteries and arterioles: Secondary | ICD-10-CM

## 2021-10-07 NOTE — Progress Notes (Unsigned)
Enrolled patient for a 3 day Zio XT monitor to be mailed to patients home  

## 2021-10-07 NOTE — Telephone Encounter (Signed)
RE: 3 day zio for palp per Dr. Johney Frame ?Received: Today ?Wilcox, Katrina Shaune Pollack M, LPN ?Done :-)   ?  ?   ?Previous Messages ?  ?----- Message -----  ?From: Nuala Alpha, LPN  ?Sent: 10/07/2021   7:41 AM EDT  ?To: Jennefer Bravo, [place]  ?Subject: 3 day zio for palp per Dr. Johney Frame          ? ?Dr. Johney Frame ordered for this pt to get a 3 day zio for palpitations.  ?Order is in.  Sent him the zio instructions via mychart.  ?Can you please enroll and let me know when you do? ?

## 2021-10-07 NOTE — Addendum Note (Signed)
Addended by: Nuala Alpha on: 10/07/2021 08:42 AM ? ? Modules accepted: Orders ? ?

## 2021-10-07 NOTE — Telephone Encounter (Signed)
RE: CMR per Dr. Johney Frame ?Received: Today ?Greggory Stallion, LPN ?I have scheduled this patient for 5/10 at 4pm. He will need to arrive at 3:30p.  ? ?Thanks,  ?Albina Billet  ?

## 2021-10-07 NOTE — Telephone Encounter (Signed)
Message ?Received: Yesterday ?Freada Bergeron, MD  Nuala Alpha, LPN ?We need to send him a 3 day zio for palpitations.  ? ?Just sending you a message because I will never remember to do this tomorrow.  ? ?[place]!  ? ?-Heather  ? ? ?Sent the pt a message via mychart with the above recommendations per Dr. Johney Frame.  Order for 3 day zio placed.  Will send the pt zio instructions via a separate email.  ?

## 2021-10-07 NOTE — Telephone Encounter (Signed)
Verbal order also given by Dr. Johney Frame this morning on this pt to get a Cardiac MRI done, for infiltrative cardiomyopathy.  ? ?Will place the order for Cardiac MRI in the system and send a message to our MRI scheduler and pre-cert, to call the pt back and arrange this test.  Dr. Johney Frame stated she will call the pt later today and endorse this information to him over the phone.  Pt should also still proceed with getting ordered 3 day zio done.  ?

## 2021-10-07 NOTE — Addendum Note (Signed)
Addended by: Nuala Alpha on: 10/07/2021 07:39 AM ? ? Modules accepted: Orders ? ?

## 2021-10-09 DIAGNOSIS — R002 Palpitations: Secondary | ICD-10-CM

## 2021-10-12 ENCOUNTER — Ambulatory Visit (HOSPITAL_COMMUNITY): Payer: Medicare Other

## 2021-10-12 ENCOUNTER — Ambulatory Visit: Payer: Medicare Other | Admitting: Physical Therapy

## 2021-10-18 DIAGNOSIS — R002 Palpitations: Secondary | ICD-10-CM | POA: Diagnosis not present

## 2021-10-19 ENCOUNTER — Encounter: Payer: Self-pay | Admitting: Physical Therapy

## 2021-10-19 ENCOUNTER — Ambulatory Visit: Payer: Medicare Other | Attending: Family Medicine | Admitting: Physical Therapy

## 2021-10-19 DIAGNOSIS — M6281 Muscle weakness (generalized): Secondary | ICD-10-CM | POA: Insufficient documentation

## 2021-10-19 DIAGNOSIS — R2681 Unsteadiness on feet: Secondary | ICD-10-CM | POA: Diagnosis not present

## 2021-10-19 NOTE — Therapy (Signed)
[facility] ?[address] ?[place], [place], [address] ?Phone: [phone]   Fax:  [phone] ? ?Physical Therapy Treatment ? ?Patient Details  ?Name: James [name] ?MRN: [id number] ?Date of Birth: 1940-12-12 ?Referring Provider (PT): Penni Homans A ? ? ?Encounter Date: 10/19/2021 ? ? PT End of Session - 10/19/21 1321   ? ? Visit Number 2   ? Number of Visits 6   ? Date for PT Re-Evaluation 11/02/21   ? Authorization Type Medicare & Federal BCBS   ? Progress Note Due on Visit 6   ? PT Start Time 1315   ? PT Stop Time 1400   ? PT Time Calculation (min) 45 min   ? Activity Tolerance Patient tolerated treatment well   ? Behavior During Therapy [facility] for tasks assessed/performed   ? ?  ?  ? ?  ? ? ?Past Medical History:  ?Diagnosis Date  ? AAA (abdominal aortic aneurysm) without rupture ([place]) 09/08/2013  ? Abdominal bloating 05/19/2016  ? Anxiety state 05/18/2014  ? Atrial fibrillation ([place])   ? 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 Benign paroxysmal positional vertigo 04/06/2013  ? CAD (coronary artery disease)   ? nonobstructive by cath 2008; nonobstructive by Cardiac CT 11/2011  ? Cervical spine fracture ([place]) 1980  ? C6-7  ? Diverticulosis   ? Erectile dysfunction 09/08/2013  ? GERD (gastroesophageal reflux disease)   ? Hiatal hernia   ? History of atrial fibrillation   ? History of squamous cell carcinoma 10-05-10  ? Dr [name], [place]  ? HLD (hyperlipidemia)   ? Hypothyroidism   ? Medicare annual wellness visit, subsequent 03/16/2014  ? Sees [place] ortho Follows with Dr Johnsie Cancel of cardiology Sees [place] for colonoscopy, last colonoscopy in 2013   ? Mitral valve prolapse   ? Neck pain 05/03/2015  ? Preventative health care 03/16/2014  ? 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 s/p excision  ? Syncope 02/03/2013  ? 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  ? ? ?Past Surgical History:  ?Procedure Laterality Date  ? APPENDECTOMY  05/2011   ? CARDIAC CATHETERIZATION  01/03/12  ? 30% ostial LAD stenosis, ostial 30-40% D1 stenosis, smooth and normal left main and RCA; LVEF 65%  ? CHOLECYSTECTOMY N/A 08/25/2014  ? Procedure: LAPAROSCOPIC CHOLECYSTECTOMY WITH INTRAOPERATIVE CHOLANGIOGRAM;  Surgeon: [name], MD;  Location: [place];  Service: General;  Laterality: N/A;  ? INGUINAL HERNIA REPAIR  07/2010  ? right  ? KNEE ARTHROPLASTY    ? bilat  ? LEFT HEART CATH AND CORONARY ANGIOGRAPHY N/A 10/01/2021  ? Procedure: LEFT HEART CATH AND CORONARY ANGIOGRAPHY;  Surgeon: Belva Crome, MD;  Location: [place] CV LAB;  Service: Cardiovascular;  Laterality: N/A;  ? LEFT HEART CATHETERIZATION WITH CORONARY ANGIOGRAM N/A 01/03/2012  ? Procedure: LEFT HEART CATHETERIZATION WITH CORONARY ANGIOGRAM;  Surgeon: [name], MD;  Location: [facility] CATH LAB;  Service: Cardiovascular;  Laterality: N/A;  ? MRI  10/31/2016  ? TONSILLECTOMY  1947  ? tumor removal  1958  ? "fatty tumor"  ? ? ?There were no vitals filed for this visit. ? ? Subjective Assessment - 10/19/21 1317   ? ? Subjective Pt. reports he had a stress test, didn't do wall, had to wear monitor x 3 days, that has been sent back.  Denies new falls.  Has not been training as hard as he was due to stress test.   ?  Pertinent History syncope, neck pain, mitral valve prolapse, hypothyroidism, HLD, a-fib, hiatal hernia, GERD, C6-7 fx 1980, CAD, BPPV, anxiety, AAA, B knee arthroscopy, hx skin CA   ? Patient Stated Goals go faster, feel more steady, be sure of balance around the house   ? Currently in Pain? No/denies   ? ?  ?  ? ?  ? ? ? ? ? ? ? ? ? ? ? ? ? ? ? ? ? ? ? ? [place] Adult PT Treatment/Exercise - 10/19/21 0001   ? ?  ? Exercises  ? Exercises Knee/Hip   ?  ? Knee/Hip Exercises: Aerobic  ? Stationary Bike L2 x 6 min   ?  ? Knee/Hip Exercises: Machines for Strengthening  ? Cybex Knee Extension 25# 2 x 10   ? Cybex Knee Flexion 25# 2 x 10   ? Cybex Leg Press 35# x 10 ( also heel press x 10)   ? ?  ?  ? ?   ? ? ? ? ? ? Balance Exercises - 10/19/21 0001   ? ?  ? Balance Exercises: Standing  ? Standing Eyes Opened Head turns;Foam/compliant surface;Limitations   ? Standing Eyes Opened Limitations in corner for safety with SBA, on airex - eyes open x 30 sec, head nods x 10, head turns x 10.   ? Standing Eyes Closed Head turns;Foam/compliant surface;Limitations;Narrow base of support (BOS)   ? Standing Eyes Closed Limitations in corner for safety with SBA, on airex - eyes closed x 30 sec, head nods x 10, head turns x 10.  repeated on level surface in semi-tandem.  Tactile cues to stabilize with larger sway.   ? SLS Eyes open;Limitations   ? SLS Limitations in corner for safety   ? SLS with Vectors Limitations   ? SLS with Vectors Limitations star excusion pattern with other leg on carpet slider x 4 each leg all directions, noted difficulty reaching behind.  SBA for safety   ? Rockerboard Anterior/posterior;Lateral;Limitations   ? Rockerboard Limitations lateral x 10, then hold x 1 min, ant/post x 10 then hold x 1 min   ? ?  ?  ? ?  ? ? ? ? ? PT Education - 10/19/21 1709   ? ? Education Details HEP update Access Code: [email or web address].  Education on safety with balance exercises.   ? Person(s) Educated Patient   ? Methods Explanation;Demonstration;Handout;Verbal cues   ? Comprehension Verbalized understanding;Returned demonstration   ? ?  ?  ? ?  ? ? ? PT Short Term Goals - 10/19/21 1712   ? ?  ? PT SHORT TERM GOAL #1  ? Title Patient to be independent with initial HEP.   ? Time 2   ? Period Weeks   ? Status On-going   ? Target Date 10/05/21   ? ?  ?  ? ?  ? ? ? ? PT Long Term Goals - 10/19/21 1712   ? ?  ? PT LONG TERM GOAL #1  ? Title Patient to be independent with advanced HEP.   ? Time 6   ? Period Weeks   ? Status On-going   ? Target Date 11/02/21   ?  ? PT LONG TERM GOAL #2  ? Title Pt. will demonstrate improved static balance by improving Berg to 56/56.   ? Baseline 52/56 - noted instability with turning, tandem stance and  SLS stance.   ? Time 6   ? Period Weeks   ? Status On-going   ?  Target Date 11/02/21   ?  ? PT LONG TERM GOAL #3  ? Title Pt. will demonstrate improved dynamic balance by improving FGA score to 28/30   ? Baseline 24/30- instability with tandem gait and head turns   ? Time 6   ? Period Weeks   ? Status On-going   ? Target Date 11/02/21   ?  ? PT LONG TERM GOAL #4  ? Title Pt. will be able to maintain SLS x 15 sec bil to demonstrate improved balance.   ? Baseline 7 seconds bil   ? Time 6   ? Period Weeks   ? Status Achieved   L 24 sec, R 30 sec  ? Target Date 11/02/21   ? ?  ?  ? ?  ? ? ? ? ? ? ? ? Plan - 10/19/21 1710   ? ? Clinical Impression Statement Mr. Mcnicholas returned today after cardiac cath and testing, still scheduled for more testing but ok to participate in PT.  Today focused on progressing balance exercises as demonstrated some instability on IE with head turns and trunk rotation, given corner balance exercises today for HEP.  He was able to maintain SLS x 24 sec L, 30 sec R, meeting LTG #4.  He did well on star excusion pattern but still noted some instability. Also introduced to weight machines.  He would benefit from continued skilled therapy.   ? Personal Factors and Comorbidities Comorbidity 3+   ? Comorbidities syncope, neck pain, mitral valve prolapse, hypothyroidism, HLD, a-fib, hiatal hernia, GERD, C6-7 fx 1980, CAD, BPPV, anxiety, AAA, B knee arthroscopy, hx skin CA   ? Examination-Activity Limitations Locomotion Level   ? Examination-Participation Restrictions Community Activity   ? Stability/Clinical Decision Making Stable/Uncomplicated   ? Rehab Potential Excellent   ? PT Frequency 1x / week   ? PT Duration 6 weeks   ? PT Treatment/Interventions ADLs/Self Care Home Management;Gait training;Therapeutic activities;Therapeutic exercise;Balance training;Neuromuscular re-education;Patient/family education;Functional mobility training   ? PT Next Visit Plan review gym exercise equipment, progress HEP    ? PT Home Exercise Plan Access Code: [email or web address]   ? Consulted and Agree with Plan of Care Patient   ? ?  ?  ? ?  ? ? ?Patient will benefit from skilled therapeutic intervention in order to improve the foll

## 2021-10-19 NOTE — Patient Instructions (Signed)
Access Code: [email or web address] ?URL: [email or web address] ?Date: 10/19/2021 ?Prepared by: Glenetta Hew ? ?Exercises ?- Single Leg Stance  - 1 x daily - 7 x weekly - 2 reps - 1 min  hold ?- Semi-Tandem Corner Balance With Eyes Open  - 1 x daily - 7 x weekly ?- Semi-Tandem Corner Balance With Eyes Closed  - 1 x daily - 7 x weekly ?

## 2021-10-21 ENCOUNTER — Encounter: Payer: Medicare Other | Admitting: Physical Therapy

## 2021-10-26 ENCOUNTER — Ambulatory Visit: Payer: Medicare Other

## 2021-10-26 ENCOUNTER — Telehealth (HOSPITAL_COMMUNITY): Payer: Self-pay | Admitting: *Deleted

## 2021-10-26 NOTE — Telephone Encounter (Signed)
Reaching out to patient to offer assistance regarding upcoming cardiac imaging study; pt verbalizes understanding of appt date/time, parking situation and where to check in, and verified current allergies; name and call back number provided for further questions should they arise ? ?Gordy Clement RN Navigator Cardiac Imaging ?[facility] ?[phone] office ?[phone] cell ? ?Patient denies metal or difficult IV. Does report claustrophobia but has a PRN ativan prescription. Advised him to take his ativan for test but he will need a driver. He verbalizes understanding. ?

## 2021-10-27 ENCOUNTER — Ambulatory Visit (HOSPITAL_COMMUNITY)
Admission: RE | Admit: 2021-10-27 | Discharge: 2021-10-27 | Disposition: A | Discharge status: Home / Self Care | Payer: Medicare Other | Source: Ambulatory Visit | Attending: Cardiology | Admitting: Cardiology

## 2021-10-27 DIAGNOSIS — I351 Nonrheumatic aortic (valve) insufficiency: Secondary | ICD-10-CM | POA: Diagnosis not present

## 2021-10-27 DIAGNOSIS — I77810 Thoracic aortic ectasia: Secondary | ICD-10-CM | POA: Insufficient documentation

## 2021-10-27 DIAGNOSIS — I7789 Other specified disorders of arteries and arterioles: Secondary | ICD-10-CM

## 2021-10-27 DIAGNOSIS — R002 Palpitations: Secondary | ICD-10-CM | POA: Insufficient documentation

## 2021-10-27 DIAGNOSIS — Q251 Coarctation of aorta: Secondary | ICD-10-CM | POA: Insufficient documentation

## 2021-10-27 DIAGNOSIS — I428 Other cardiomyopathies: Secondary | ICD-10-CM | POA: Insufficient documentation

## 2021-10-27 MED ORDER — GADOBUTROL 1 MMOL/ML IV SOLN
6.5000 mL | Freq: Once | INTRAVENOUS | Status: AC | PRN
  Administered 2021-10-27: 6.5 mL via INTRAVENOUS

## 2021-10-29 NOTE — Therapy (Addendum)
OUTPATIENT PHYSICAL THERAPY TREATMENT NOTE  PHYSICAL THERAPY DISCHARGE SUMMARY  Visits from Start of Care: 3  Current functional level related to goals / functional outcomes: Improved balance   Remaining deficits: LOB with retro tandem gait   Education / Equipment: HEP  Plan: Patient agrees to discharge.  Patient is being discharged due to meeting the stated rehab goals.  Patient was able to participate in higher level balance activities with good form, and maintain SLS x 30 sec and tandem stance x 30 sec, demonstrating decreased risk of falls.  He elected to be placed on 30 day hold after 11/02/2021 appointment and is independent and compliant with HEP.  He has not needed to return within stated interval.      Rennie Natter, PT, DPT 9:21 AM 12/03/2021   Patient Name: James [name] MRN: [id number] DOB:07-08-1940, 81 y.o., male, male Today's Date: 11/02/2021  PCP: [name], MD REFERRING PROVIDER: [name], MD   PT End of Session - 11/02/21 1314     Visit Number 3    Number of Visits 6    Date for PT Re-Evaluation 11/02/21    Authorization Type Medicare & Federal BCBS    Progress Note Due on Visit 6    PT Start Time 1315    PT Stop Time 2703    PT Time Calculation (min) 43 min    Activity Tolerance Patient tolerated treatment well    Behavior During Therapy [facility] for tasks assessed/performed             Past Medical History:  Diagnosis Date   AAA (abdominal aortic aneurysm) without rupture ([place]) 09/08/2013   Abdominal bloating 05/19/2016   Anxiety state 05/18/2014   Atrial fibrillation ([place])    BCC (basal cell carcinoma of skin) 06/09/2014   Benign paroxysmal positional vertigo 04/06/2013   CAD (coronary artery disease)    nonobstructive by cath 2008; nonobstructive by Cardiac CT 11/2011   Cervical spine fracture ([place]) 1980   C6-7   Diverticulosis    Erectile dysfunction 09/08/2013   GERD (gastroesophageal reflux disease)    Hiatal hernia    History  of atrial fibrillation    History of squamous cell carcinoma 10-05-10   Dr [name], [place] (hyperlipidemia)    Hypothyroidism    Medicare annual wellness visit, subsequent 03/16/2014   Sees [facility] Follows with Dr Johnsie Cancel of cardiology Sees [facility] for colonoscopy, last colonoscopy in 2013    Mitral valve prolapse    Neck pain 05/03/2015   Preventative health care 03/16/2014   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   s/p excision   Syncope 02/03/2013   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   Past Surgical History:  Procedure Laterality Date   APPENDECTOMY  05/2011   CARDIAC CATHETERIZATION  01/03/12   30% ostial LAD stenosis, ostial 30-40% D1 stenosis, smooth and normal left main and RCA; LVEF 65%   CHOLECYSTECTOMY N/A 08/25/2014   Procedure: LAPAROSCOPIC CHOLECYSTECTOMY WITH INTRAOPERATIVE CHOLANGIOGRAM;  Surgeon: [name], MD;  Location: [place];  Service: General;  Laterality: N/A;   INGUINAL HERNIA REPAIR  07/2010   right   KNEE ARTHROPLASTY     bilat   LEFT HEART CATH AND CORONARY ANGIOGRAPHY N/A 10/01/2021   Procedure: LEFT HEART CATH AND CORONARY ANGIOGRAPHY;  Surgeon: Belva Crome, MD;  Location: [facility] [facility];  Service: Cardiovascular;  Laterality: N/A;   LEFT HEART CATHETERIZATION WITH CORONARY ANGIOGRAM N/A 01/03/2012   Procedure:  LEFT HEART CATHETERIZATION WITH CORONARY ANGIOGRAM;  Surgeon: [name], MD;  Location: [facility] CATH LAB;  Service: Cardiovascular;  Laterality: N/A;   MRI  10/31/2016   TONSILLECTOMY  1947   tumor removal  1958   "fatty tumor"   Patient Active Problem List   Diagnosis Date Noted   Abnormal stress test    Unsteady gait 09/06/2021   Nocturia 09/06/2021   COVID-19 01/17/2021   Renal insufficiency 07/12/2020   Skin lesion of left leg 09/17/2019   Bilateral thoracic back pain 11/27/2016   Essential hypertension 11/27/2016   Abdominal bloating 05/19/2016   BPH (benign prostatic hyperplasia) 08/14/2015    Neck pain 05/03/2015   BCC (basal cell carcinoma of skin) 06/09/2014   Anxiety state 05/18/2014   Low testosterone 03/16/2014   Insomnia 03/16/2014   Preventative health care 03/16/2014   Diverticulosis of colon without hemorrhage 03/16/2014   OA (osteoarthritis) of knee 10/19/2013   AAA (abdominal aortic aneurysm) without rupture ([place]) 09/08/2013   Erectile dysfunction 09/08/2013   Benign paroxysmal positional vertigo 04/06/2013   Chest pain 01/03/2012   Hyperlipidemia 01/03/2012   CAD (coronary artery disease) 01/03/2012   Hypothyroidism 01/03/2012   GERD (gastroesophageal reflux disease) 01/03/2012   History of atrial fibrillation    Diverticulitis    Mitral valve prolapse     REFERRING DIAG: R26.81 (ICD-10-CM) - Unsteady gait  THERAPY DIAG:  Unsteadiness on feet  Muscle weakness (generalized)  PERTINENT HISTORY: syncope, neck pain, mitral valve prolapse, hypothyroidism, HLD, a-fib, hiatal hernia, GERD, C6-7 fx 1980, CAD, BPPV, anxiety, AAA, B knee   PRECAUTIONS: none  SUBJECTIVE: Pt. Reports he is doing well, race in [place] was easy "no real competition." Denies pain or LOB/falls.   PAIN:  Are you having pain? No  OBJECTIVE: (objective measures completed at initial evaluation unless otherwise dated) FOTO: balance 60%.  Only 62% predicted after 12 visits.       Posture/Postural Control  Posture/Postural Control Postural limitations   Postural Limitations Forward head      ROM / Strength  AROM / PROM / Strength Strength      Strength  Overall Strength Deficits   Overall Strength Comments 5/5 LE strength except hip extensors 4+/5 bil, glut med 4+/5 bil      Ambulation/Gait  Ambulation/Gait No   Ambulation Distance (Feet) 600 Feet   Assistive device None   Gait Pattern Within Functional Limits   Gait velocity not formally measured, but rapid walking speed >1.33 m/s   Stairs Yes   Stairs Assistance 7: Independent   Stair Management Technique No rails   Number of  Stairs 14   Height of Stairs 8      Balance  Balance Assessed Yes      Static Standing Balance  Static Standing - Balance Support No upper extremity supported   Static Standing - Level of Assistance 7: Independent   Static Standing Balance -  Activities  Single Leg Stance - Right Leg;Single Leg Stance - Left Leg;Tandam Stance - Right Leg;Tandam Stance - Left Leg;Romberg - Eyes Opened;Romberg - Eyes Closed;Romberg - Eyes Opened, Foam;Romberg - Eyes Closed , Foam   Static Standing - Comment/# of Minutes SLS x 7 sec bil, tandem stance 30 sec bil, conditons 1-4 mCTSIB 30 sec      Standardized Balance Assessment  Standardized Balance Assessment Berg Balance Test;Five Times Sit to Stand      Berg Balance Test  Sit to Stand Able to stand without using hands and stabilize  independently   Standing Unsupported Able to stand safely 2 minutes   Sitting with Back Unsupported but Feet Supported on Floor or Stool Able to sit safely and securely 2 minutes   Stand to Sit Sits safely with minimal use of hands   Transfers Able to transfer safely, minor use of hands   Standing Unsupported with Eyes Closed Able to stand 10 seconds safely   Standing Unsupported with Feet Together Able to place feet together independently and stand 1 minute safely   From Standing, Reach Forward with Outstretched Arm Can reach confidently >25 cm (10")   From Standing Position, Pick up Object from Floor Able to pick up shoe safely and easily   From Standing Position, Turn to Look Behind Over each Shoulder Needs supervision when turning   very unsteady  Turn 360 Degrees Able to turn 360 degrees safely in 4 seconds or less   Standing Unsupported, Alternately Place Feet on Step/Stool Able to stand independently and safely and complete 8 steps in 20 seconds   Standing Unsupported, One Foot in Front Able to place foot tandem independently and hold 30 seconds   Standing on One Leg Able to lift leg independently and hold 5-10 seconds    Total Score 52   Berg comment: low risk of falls      Functional Gait  Assessment  Gait assessed  Yes   Gait Level Surface Walks 20 ft in less than 5.5 sec, no assistive devices, good speed, no evidence for imbalance, normal gait pattern, deviates no more than 6 in outside of the 12 in walkway width.   Change in Gait Speed Able to smoothly change walking speed without loss of balance or gait deviation. Deviate no more than 6 in outside of the 12 in walkway width.   Gait with Horizontal Head Turns Performs head turns smoothly with slight change in gait velocity (eg, minor disruption to smooth gait path), deviates 6-10 in outside 12 in walkway width, or uses an assistive device.   Gait with Vertical Head Turns Performs task with slight change in gait velocity (eg, minor disruption to smooth gait path), deviates 6 - 10 in outside 12 in walkway width or uses assistive device   Gait and Pivot Turn Pivot turns safely within 3 sec and stops quickly with no loss of balance.   Step Over Obstacle Is able to step over 2 stacked shoe boxes taped together (9 in total height) without changing gait speed. No evidence of imbalance.   Gait with Narrow Base of Support Ambulates less than 4 steps heel to toe or cannot perform without assistance.   Gait with Eyes Closed Walks 20 ft, uses assistive device, slower speed, mild gait deviations, deviates 6-10 in outside 12 in walkway width. Ambulates 20 ft in less than 9 sec but greater than 7 sec.   Ambulating Backwards Walks 20 ft, no assistive devices, good speed, no evidence for imbalance, normal gait   Steps Alternating feet, no rail.   Total Score 24   FGA comment: <22/30 indicates fall risk       TODAY'S TREATMENT:  11/02/2021  Neuromuscular Reeducation: to improve balance and stability. SBA for safety throughout.  Bike L3 x 6 min for warm-up/subjective Star excursion pattern x 5 bil  Balance board frontal plane x 10, hold 3 x 30 sec, sagittal plane 2 x 10,  hold 1 min  Bosu - side step ups x 10 bil - intermittant UE support on bar, forward step up  x 10 bil  SLS 2 x 30 sec bil (noted weakness on LLE)  Tandem Stance  x 30 sec bil  In hallway : forward and retro walking with ball toss (lob x 1 reaching outside BOS, recovered without assistance), side stepping with ball toss, braiding, tandem gait, retro tandem gait.      PATIENT EDUCATION: Education details: HEP update Person educated: Patient Education method: Consulting civil engineer, Demonstration, Verbal cues, and Handouts Education comprehension: verbalized understanding and returned demonstration   HOME EXERCISE PROGRAM: [id number]   Exercises  Added  - Single Leg Balance with Clock Reach  - 1 x daily - 7 x weekly - 3 sets - 10 reps - Walking Tandem Stance  - 1 x daily - 7 x weekly - 10 reps in hallway for safety - Backward Tandem Walking  - 1 x daily - 7 x weekly - 10 reps in hallway for safety  ASSESSMENT: Clinical Impression: JOHNATTAN STRASSMAN is making good progress towards goals.  He is now able to maintain static SLS and tandem stance for 30 sec bil. demonstrating decreased risk of falls.  He was able to progress to more challenging dynamic exercises incorporating compliant surface on bosu, balance board, and ball catch with gait, able to correct small LOB and stabilize without assistance, until end of session where had larger LOB with retro tandem walking but this was due to muscle fatigue.  Progressed HEP but cautioned to perform tandem gait in hallway for safety at home.     PT Short Term Goals - 11/02/21 1402       PT SHORT TERM GOAL #1   Title Patient to be independent with initial HEP.    Time 2    Period Weeks    Status Achieved    Target Date 10/05/21              PT Long Term Goals - 11/02/21 1402       PT LONG TERM GOAL #1   Title Patient to be independent with advanced HEP.    Time 6    Period Weeks    Status On-going    Target Date 11/02/21      PT LONG TERM GOAL #2    Title Pt. will demonstrate improved static balance by improving Berg to 56/56.    Baseline 52/56 - noted instability with turning, tandem stance and SLS stance.    Time 6    Period Weeks    Status On-going    Target Date 11/02/21      PT LONG TERM GOAL #3   Title Pt. will demonstrate improved dynamic balance by improving FGA score to 28/30    Baseline 24/30- instability with tandem gait and head turns    Time 6    Period Weeks    Status On-going    Target Date 11/02/21      PT LONG TERM GOAL #4   Title Pt. will be able to maintain SLS x 15 sec bil to demonstrate improved balance.    Baseline 7 seconds bil    Time 6    Period Weeks    Status Achieved   L 24 sec, R 30 sec   Target Date 11/02/21            PLAN:    PT Frequency 1x / week   PT Duration 6 weeks   PT Treatment/Interventions ADLs/Self Care Home Management;Gait training;Therapeutic activities;Therapeutic exercise;Balance training;Neuromuscular re-education;Patient/family education;Functional mobility training   PT Next Visit  Plan Continue to progress dynamic balance exercises, reassess.                   Patient will benefit from skilled therapeutic intervention in order to improve the following deficits and impairments:  Decreased strength, Decreased balance    Rennie Natter, PT, DPT  11/02/2021, 2:09 PM

## 2021-11-02 ENCOUNTER — Encounter: Payer: Self-pay | Admitting: Physical Therapy

## 2021-11-02 ENCOUNTER — Ambulatory Visit: Payer: Medicare Other | Admitting: Physical Therapy

## 2021-11-02 DIAGNOSIS — R2681 Unsteadiness on feet: Secondary | ICD-10-CM

## 2021-11-02 DIAGNOSIS — M6281 Muscle weakness (generalized): Secondary | ICD-10-CM

## 2021-11-19 ENCOUNTER — Other Ambulatory Visit: Payer: Self-pay | Admitting: Family Medicine

## 2021-11-19 NOTE — Telephone Encounter (Signed)
Requesting:ambien 10 mg  Contract: 09/03/21 UDS:01/11/21 Last Visit:09/06/21 Next Visit:03/10/22 Last Refill:08/04/21  Please Advise

## 2021-12-06 ENCOUNTER — Other Ambulatory Visit: Payer: Self-pay | Admitting: Family Medicine

## 2021-12-10 ENCOUNTER — Telehealth: Payer: Self-pay | Admitting: Family Medicine

## 2021-12-10 DIAGNOSIS — I10 Essential (primary) hypertension: Secondary | ICD-10-CM

## 2021-12-17 ENCOUNTER — Other Ambulatory Visit: Payer: Self-pay | Admitting: Family Medicine

## 2022-01-10 DIAGNOSIS — L570 Actinic keratosis: Secondary | ICD-10-CM | POA: Diagnosis not present

## 2022-01-10 DIAGNOSIS — Z85828 Personal history of other malignant neoplasm of skin: Secondary | ICD-10-CM | POA: Diagnosis not present

## 2022-01-10 DIAGNOSIS — Z129 Encounter for screening for malignant neoplasm, site unspecified: Secondary | ICD-10-CM | POA: Diagnosis not present

## 2022-01-10 DIAGNOSIS — L820 Inflamed seborrheic keratosis: Secondary | ICD-10-CM | POA: Diagnosis not present

## 2022-01-10 DIAGNOSIS — Z808 Family history of malignant neoplasm of other organs or systems: Secondary | ICD-10-CM | POA: Diagnosis not present

## 2022-01-25 ENCOUNTER — Other Ambulatory Visit: Payer: Self-pay | Admitting: Family Medicine

## 2022-01-26 ENCOUNTER — Other Ambulatory Visit: Payer: Self-pay | Admitting: Pharmacist

## 2022-01-26 ENCOUNTER — Ambulatory Visit (INDEPENDENT_AMBULATORY_CARE_PROVIDER_SITE_OTHER): Payer: Medicare Other | Admitting: Pharmacist

## 2022-01-26 DIAGNOSIS — R7989 Other specified abnormal findings of blood chemistry: Secondary | ICD-10-CM

## 2022-01-26 DIAGNOSIS — E039 Hypothyroidism, unspecified: Secondary | ICD-10-CM

## 2022-01-26 DIAGNOSIS — I10 Essential (primary) hypertension: Secondary | ICD-10-CM

## 2022-01-26 DIAGNOSIS — E785 Hyperlipidemia, unspecified: Secondary | ICD-10-CM

## 2022-01-26 MED ORDER — LORAZEPAM 0.5 MG PO TABS: 0.5000 mg | ORAL_TABLET | Freq: Two times a day (BID) | ORAL | 1 refills | Status: DC | PRN

## 2022-01-26 NOTE — Chronic Care Management (AMB) (Signed)
Chronic Care Management Pharmacy Note  01/26/2022 Name:  James [name] MRN:  [id number] DOB:  May 22, 1941  Summary:  Reviewed medication list. Patient had questions about indications for isosorbide and metoprolol. Education provided about benefits of both therapies and side effects to monitor. Patient reports he is tolerating both medications.  He reports he stopped aspirin 67m - was taking 4 days per week. He stopped aspirin because he started tumeric and read that it also could "thin the blood" Expalined that tumeric is not a substitute for aspirin and that there are not studies with using it in the same was to prevent stoke and heart disease. I did send a message to his cardiologist. Dr PJohney Frame  to let her know that he stopped aspirin and to see if she recommends he restart aspirin. Awaiting response.  Exercising regularly 4 days per week for 60 minutes. He just completed in the [employer] Olympics in speed walking 1503mnd won silver. He does feel that his exercise tolerance has decreased a little since starting metoprolol.   Subjective: James HANNERs an [age].o. year old male who is a primary patient of BlMosie LukesMD.  The CCM team was consulted for assistance with disease management and care coordination needs.    Engaged with patient by telephone for follow up visit in response to provider referral for pharmacy case management and/or care coordination services.   Consent to Services:  The patient was given information about Chronic Care Management services, agreed to services, and gave verbal consent prior to initiation of services.  Please see initial visit note for detailed documentation.   Patient Care Team: BlMosie LukesMD as PCP - General (Family Medicine) Hollar, CaKatharine LookMD as Referring Physician (Dermatology) HuCarol AdaMD as Consulting Physician (Gastroenterology) EcCherre RobinsRPHartPharmacist) PeFreada BergeronMD as Consulting Physician  (Cardiology)  Recent office visits: 09/06/2021 - Fam MEd (Dr BlCharlett BlakePreventative health visit. Referred to PT for balance. Labs checked.  01/11/2021 - Fam Med (Dr BlCharlett BlakeFollow up post COVID infection. No med changes. Labs checked.   Recent consult visits: 09/28/2021 - Cardio (Dr PeJohney FrameF/U lightheadedness with exertion. Ordered ETT and perfusion PET. Recommended adequtae hydration and electrolyte repletion. Consider stopping tamsulosin if work up unrevealing.  11[facility]09[facility]2020 [facility]  [facility]: [facility] Results: Angiographically normal coronary arteries. Right dominant anatomy. Normal LV function with normal LVEDP.  EF 55%. Dilated aorta  Objective:  Lab Results  Component Value Date   CREATININE 0.87 09/29/2021   CREATININE 0.94 07/15/2021   CREATININE 0.98 09/21/2020    No results found for: "HGBA1C" Last diabetic Eye exam: No results found for: "HMDIABEYEEXA"  Last diabetic Foot exam: No results found for: "HMDIABFOOTEX"      Component Value Date/Time   CHOL 125 07/15/2021 0829   TRIG 71.0 07/15/2021 0829   HDL 57.80 07/15/2021 0829   CHOLHDL 2 07/15/2021 0829   VLDL 14.2 07/15/2021 0829   LDLCALC 53 07/15/2021 0829   LDLCALC 46 07/09/2020 1526       Latest Ref Rng & Units 07/15/2021    8:29 AM 09/21/2020    2:21 PM 07/27/2020    3:04 PM  Hepatic Function  Total Protein 6.0 - 8.3 g/dL 6.6  7.0  6.3   Albumin 3.5 - 5.2 g/dL 3.9  4.3  3.8   AST 0 -  37 U/L 21  20  19    ALT 0 - 53 U/L 14  17  13    Alk Phosphatase 39 - 117 U/L 54  49  47   Total Bilirubin 0.2 - 1.2 mg/dL 1.1  0.6  0.6     Lab Results  Component Value Date/Time   TSH 2.97 07/15/2021 08:29 AM   TSH 0.98 07/09/2020 03:26 PM       Latest Ref Rng & Units 09/29/2021   11:40 AM 07/15/2021    8:29 AM 07/09/2020    3:26 PM  CBC  WBC 3.4 - 10.8 x10E3/uL 6.0  6.5  7.4    Hemoglobin 13.0 - 17.7 g/dL 14.8  13.7  13.9   Hematocrit 37.5 - 51.0 % 42.9  40.9  39.4   Platelets 150 - 450 x10E3/uL 207  200.0  194     No results found for: "VD25OH"  Clinical ASCVD: Yes  The ASCVD Risk score (Arnett DK, et al., 2019) failed to calculate for the following reasons:   The 2019 ASCVD risk score is only valid for ages 67 to 17     Social History   Tobacco Use  Smoking Status Never  Smokeless Tobacco Never   BP Readings from Last 3 Encounters:  10/01/21 110/69  09/28/21 118/60  09/06/21 135/80   Pulse Readings from Last 3 Encounters:  10/01/21 (!) 46  09/28/21 61  09/06/21 (!) 47   Wt Readings from Last 3 Encounters:  10/01/21 152 lb (68.9 kg)  09/28/21 152 lb (68.9 kg)  09/23/21 155 lb (70.3 kg)    Assessment: Review of patient past medical history, allergies, medications, health status, including review of consultants reports, laboratory and other test data, was performed as part of comprehensive evaluation and provision of chronic care management services.   SDOH:  (Social Determinants of Health) assessments and interventions performed:  SDOH Interventions    Flowsheet Row Most Recent Value  SDOH Interventions   Physical Activity Interventions Intervention Not Indicated  Social Connections Interventions Intervention Not Indicated       CCM Care Plan  Allergies  Allergen Reactions   Sulfa Antibiotics Other (See Comments)    Childhood allergy    Medications Reviewed Today     Reviewed by Cherre Robins, RPH-CPP (Pharmacist) on 01/26/22 at 1425  Med List Status: <None>   Medication Order Taking? Sig Documenting Provider Last Dose Status Informant  ASHWAGANDHA PO [id number] Yes Take 1 tablet by mouth daily. [provider] Taking Active Self  atorvastatin (LIPITOR) 20 MG tablet [id number] Yes Take 1 tablet (20 mg total) by mouth daily. [name], MD Taking Active Self  Calcium Carb-Cholecalciferol (CALCIUM 1000 + D PO)  [id number] Yes Take 1 tablet by mouth 3 (three) times a week.  [provider] Taking Active Self  COLLAGEN PO [id number] Yes Take 1 capsule by mouth daily. (Chewable - has hyaluronic acid, collagen and biotin) [provider] Taking Active   Cyanocobalamin (VITAMIN B 12 PO) [id number] Yes Take 3,000 mcg by mouth daily. [provider] Taking Active Self  diclofenac Sodium (VOLTAREN) 1 % GEL [id number] Yes Apply 4 g topically 4 (four) times daily. To affected joint.  Patient taking differently: Apply 4 g topically daily as needed (To affected joint.).   Rosemarie Ax, MD Taking Active Self  Dutasteride-Tamsulosin HCl 0.5-0.4 MG CAPS [id number] Yes TAKE 1 CAPSULE BY MOUTH AT BEDTIME [name], MD Taking Active   ELDERBERRY PO [id number] Yes Take 50  mg by mouth 3 (three) times a week. [provider] Taking Active Self  isosorbide mononitrate (IMDUR) 30 MG 24 hr tablet [id number] Yes Take 0.5 tablets (15 mg total) by mouth daily. Freada Bergeron, MD Taking Active   levothyroxine (SYNTHROID) 100 MCG tablet [id number] Yes TAKE 1 TABLET BY MOUTH EVERY MORNING BEFORE BREAKFAST [name], MD Taking Active            Med Note [facility] [place] [facility]   Wed Jan 26, 2022  2:14 PM) Taking 6 days per week.   LORazepam (ATIVAN) 0.5 MG tablet [id number] Yes TAKE 1 TABLET BY MOUTH TWICE DAILY AS NEEDED FOR ANXIETY [name], MD Taking Active Self  Melatonin 10 MG TABS [id number] Yes Take 10 mg by mouth at bedtime. [provider] Taking Active Self  metoprolol succinate (TOPROL XL) 25 MG 24 hr tablet [id number] Yes Take 0.5 tablets (12.5 mg total) by mouth at bedtime. Freada Bergeron, MD Taking Active   Milk Thistle 1000 MG CAPS [id number] Yes Take 1,000 mg by mouth in the morning. [provider] Taking Active Self  mirtazapine (REMERON) 15 MG tablet [id number] Yes TAKE 1 TABLET BY MOUTH AT BEDTIME [name], MD Taking Active Self  Multiple  Vitamins-Minerals (MULTIVITAMINS THER. W/MINERALS) Sheral Flow [id number] Yes Take 1 tablet by mouth daily. [provider] Taking Active Self  pantoprazole (PROTONIX) 40 MG tablet [id number] Yes TAKE 1 TABLET BY MOUTH 2 TIMES A DAY BEFORE MEALS FOR ACID REFLUX  Patient taking differently: 40 mg daily before breakfast.   [name], MD Taking Active Self  Probiotic Product (PROBIOTIC DAILY PO) [id number] Yes Take 1 tablet by mouth daily. [provider] Taking Active Self  sildenafil (REVATIO) 20 MG tablet [id number] Yes TAKE 1 TO 5 TABLETS BY MOUTH EVERY DAY AS NEEDED [name], MD Taking Active   Testosterone 30 MG/ACT SOLN [id number] Yes Apply 1 Pump topically daily. Five days a week Colon Branch, MD Taking Active   Turmeric (QC TUMERIC COMPLEX) 500 MG CAPS [id number] Yes Take 1 tablet by mouth daily. 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   Anxiety state 05/18/2014   Low testosterone 03/16/2014   Insomnia 03/16/2014   Preventative health care 03/16/2014   Diverticulosis of colon without hemorrhage 03/16/2014   OA (osteoarthritis) of knee 10/19/2013   AAA (abdominal aortic aneurysm) without rupture ([place]) 09/08/2013   Erectile dysfunction 09/08/2013   Benign paroxysmal positional vertigo 04/06/2013   Chest pain 01/03/2012   Hyperlipidemia 01/03/2012   CAD (coronary artery  disease) 01/03/2012   Hypothyroidism 01/03/2012   GERD (gastroesophageal reflux disease) 01/03/2012   History of atrial fibrillation    Diverticulitis    Mitral valve prolapse     Immunization History  Administered Date(s) Administered   Fluad Quad(high Dose 65+)  07/09/2020, 05/05/2021   Influenza Whole 03/20/2012   Influenza, High Dose Seasonal PF 04/02/2013, 04/23/2015   Influenza,inj,Quad PF,6+ Mos 03/11/2014, 04/08/2019   Influenza-Unspecified 03/03/2016, 03/20/2018   PFIZER(Purple Top)SARS-COV-2 Vaccination 07/06/2019, 07/27/2019, 04/24/2020   Pneumococcal Conjugate-13 03/11/2014   Pneumococcal Polysaccharide-23 04/18/2009   Tdap 08/05/2009   Zoster Recombinat (Shingrix) 08/24/2020, 11/17/2020   Zoster, Live 09/10/2008    Conditions to be addressed/monitored: CAD, HTN, HLD, Anxiety and insomnia; low testosterone; BPH; lylmphocytic colitis; GERD; AAA;   Care Plan : General Pharmacy (Adult)  Updates made by Cherre Robins, RPH-CPP since 01/26/2022 12:00 AM     Problem: Medication and Chronic Disease Management support, education, and care coordination needs related to HTN, Hyperlipidemia/CAD, Insomnia/Anxiety, Hypothyroidism, BPH, Colitis, low  testosterone   Note:   Current Barriers:  Does not adhere to prescribed medication regimen - improving Chronic Disease Management support, education, and care coordination needs related to Hypertension, Hyperlipidemia/CAD, Depression/Anxiety, Hypothyroidism, BPH, Colitis, Insomnia  Pharmacist Clinical Goal(s):  Over the next 180 days, patient will adhere to prescribed medication regimen as evidenced by patient self reporting and  collaborating with PharmD and provider.   Interventions: 1:1 collaboration with [name], MD regarding development and update of comprehensive plan of care as evidenced by provider attestation and co-signature Inter-disciplinary care team collaboration (see longitudinal plan of care) Comprehensive  medication review performed; medication list updated in electronic medical record  Hypertension BP goal <130/80; Controlled Competes in the [employer] in the 1 mile and 3 mile run or speed walking events. He is doing great with daily physical activity and is adherent to low fat / low sodium diet.  Current regimen:  Diet and exercise management   Interventions: Discussed blood pressure  goal Continue with current diet and exercise  Cardiomyopathy / palpitations / Chest pain:  Goal: maintain heart health and prevent chest pain Current regimen:  Metoprolol 46m- take 0.5 tablet = 12.559mdaily Isosorbide ER 3090m take 0.5 tablet = 65m33mily Interventions:  Discussed indications for metoprolol and isosorbide. Patient had many questions about why they were started and if he needed to continue.  Continue with plan to follow up with Dr PembJohney Frameyperlipidemia  LDL goal < 70; At LDL goal Current regimen:  Atorvastatin 20mg69mly at bedtime Last Hgb was normal 07/15/2021 Patient stopped aspirin 81mg 49mis own - was taking 4 days per week Interventions: Reviewed last lipid panel with patient and discussed lipid goals (his LDL and HDL are excellent!) Maintain cholesterol medication regimen.  Reached out to cardiologist regarding aspirin therapy - awaiting recommendation  Anxiety/Insomnia Reports sleep has improved a little Insurance formulary reviewed for sleep medication coverage:  Tier 1 medications eszopiclone / Lunesta (patient has tried in past but cannot remember why he stopped); doxepin / Silenor Tier 3 medications Belsomnra and Dayvigo (both are orexin receptor antagonist) Current regimen:  Lorazepam 0.5mg tw39m daily as needed (uses about once per week) Mirtazapine 65mg da29mat bedtime Zolpidem 10mg dai57mt bedtime (patient takes 1/2 tablet before bedtime and additional 1/2 tablet if wakes during night) Melatonin 10mg dail20m  bedtime Interventions: Formulary review performed at previous visit.  Discussed possibility of morning drowsiness with above medications Continue to take medications as directed  Low Testosterone:  Recent serum testosterone was 464 on 07/15/2021 - no change in topical testosterone dose  Last CBC showed Hgb and HCT were WNL PSA was prior to starting testosterone and was 0.51 (checked 2016)   Lab Results  Component Value Date   PSA 0.47 01/09/2018   PSA 0.51 04/23/2015   PSA 0.62 03/11/2014  Current regimen:  Testosterone topical - 67m/dose - apply 1 pump under each arm each morning 5 days per week  Interventions: Reviewed proper application of testosterone and ways to decreased transfer to others.  Continue to monitor testosterone level and CBC every 6 months.  Continue testosterone at current dose Requested updated prescription for testosterone from PCP  BPH:  Patient reports symptoms of BPH - urinary hesitancy and frequency are managed with current therapy Current regimen:  Dutasteride - tamsulosin 0.5/0.443mat bedtime Interventions:  Recommended continue current therapy  Hypothyroidism:  Last TSH was WNL on 07/15/2021 Current regimen:  Levothyroxine 10021mdaily (per patient he is taking only 6 days per week) Interventions:  Continue current regimen Recheck TSH in 6 to 12 months   Lymphocytic Colitis / diarrhea / GERD  Monitored by GI - Dr PatCarol Adaclines breakthrough reflux.  Colitis controlled currently Current regimen:  Pantoprazole 71m56mice a day Probiotic daily  Intervention:  Recommended continue current therapy and f/u with Dr HungBenson [place] Medication management Current pharmacy: [facility]  Interventions Comprehensive medication review performed. Reviewed refill history and adherence Continue current medication management strategy  Patient Goals/Self-Care Activities Over the next 180 days, patient will:  take medications as prescribed and  continue to exercise daily and follow low fat / low sodium diet  Follow Up Plan: Telephone follow up appointment with care management team member scheduled for:  6 months        Medication Assistance: None required.  Patient affirms current coverage meets needs.  [facility]:  [facility]: [phone]: [phone]/[id number], [place] - [id number] [place] [id number]: [phone] Up:  Patient agrees to Care Plan and Follow-up.  Plan: Telephone follow up appointment with care management team member scheduled for:  6 months  TammyCherre RobinsrmD Clinical Pharmacist [facility]

## 2022-01-26 NOTE — Telephone Encounter (Signed)
Patient is requesting RF for lorazepam 0.'5mg'$  SIG: 1 tablet up to twice a day as needed for anxiety.   Last RF:  12/06/2021 #30 tablets  Pharmacy: [facility]

## 2022-01-26 NOTE — Patient Instructions (Addendum)
Mr. Carvalho It was a pleasure speaking with you today.  Below is a summary of your health goals and summary of our recent visit. You can also view your updated Chronic Care Management Care plan through your MyChart account.   Patient Goals/Self-Care Activities take medications as prescribed and  continue to exercise daily  Continue  low fat / low sodium diet Checking with Dr Johney Frame about aspirin therapy.    As always if you have any questions or concerns especially regarding medications, please feel free to contact me either at the phone number below or with a MyChart message.   Keep up the good work!  Cherre Robins, PharmD Clinical Pharmacist [facility] [phone] (direct line)  [phone] (main office number)   The patient verbalized understanding of instructions, educational materials, and care plan provided today and agreed to receive a mailed copy of patient instructions, educational materials, and care plan.

## 2022-01-28 ENCOUNTER — Telehealth: Payer: Self-pay | Admitting: Family Medicine

## 2022-01-28 NOTE — Telephone Encounter (Signed)
Pt called asking if he could take ambien or panoprazole with the isorbide he is on.

## 2022-01-28 NOTE — Telephone Encounter (Signed)
Ok to take isosorbide and ambien at night. He should try to take pantoprazole 30 minutes before either morning or evening meal.  Patient notified and voices understanding.

## 2022-02-17 DIAGNOSIS — N400 Enlarged prostate without lower urinary tract symptoms: Secondary | ICD-10-CM | POA: Diagnosis not present

## 2022-02-17 DIAGNOSIS — I10 Essential (primary) hypertension: Secondary | ICD-10-CM | POA: Diagnosis not present

## 2022-02-17 DIAGNOSIS — E785 Hyperlipidemia, unspecified: Secondary | ICD-10-CM

## 2022-02-17 DIAGNOSIS — E039 Hypothyroidism, unspecified: Secondary | ICD-10-CM | POA: Diagnosis not present

## 2022-03-10 ENCOUNTER — Ambulatory Visit: Payer: Medicare Other | Admitting: Family Medicine

## 2022-03-12 ENCOUNTER — Encounter: Payer: Self-pay | Admitting: Cardiology

## 2022-03-12 DIAGNOSIS — R9431 Abnormal electrocardiogram [ECG] [EKG]: Secondary | ICD-10-CM

## 2022-03-12 DIAGNOSIS — I471 Supraventricular tachycardia: Secondary | ICD-10-CM

## 2022-03-12 DIAGNOSIS — I499 Cardiac arrhythmia, unspecified: Secondary | ICD-10-CM

## 2022-03-12 DIAGNOSIS — I4719 Other supraventricular tachycardia: Secondary | ICD-10-CM

## 2022-03-12 DIAGNOSIS — R002 Palpitations: Secondary | ICD-10-CM

## 2022-03-12 DIAGNOSIS — R42 Dizziness and giddiness: Secondary | ICD-10-CM

## 2022-03-12 DIAGNOSIS — R9439 Abnormal result of other cardiovascular function study: Secondary | ICD-10-CM

## 2022-03-14 NOTE — Progress Notes (Unsigned)
Office Visit    Patient Name: James [name] Date of Encounter: 03/15/2022  Primary Care Provider:  [name], MD Primary Cardiologist:  Freada Bergeron, MD Primary Electrophysiologist: None  Chief Complaint    James [name] is a 81 y.o. male with PMH of atrial fibrillation, HLD, hypothyroidism, GERD, nonobstructive CAD, AAA who presents today for complaint of chest pain and lightheadedness.  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   Benign paroxysmal positional vertigo 04/06/2013   CAD (coronary artery disease)    nonobstructive by cath 2008; nonobstructive by Cardiac CT 11/2011   Cervical spine fracture ([place]) 1980   C6-7   Diverticulosis    Erectile dysfunction 09/08/2013   GERD (gastroesophageal reflux disease)    Hiatal hernia    History of atrial fibrillation    History of squamous cell carcinoma 10-05-10   Dr [name], [place] (hyperlipidemia)    Hypothyroidism    Medicare annual wellness visit, subsequent 03/16/2014   Sees  ortho Follows with Dr Johnsie Cancel of cardiology Sees [facility] for colonoscopy, last colonoscopy in 2013    Mitral valve prolapse    Neck pain 05/03/2015   Preventative health care 03/16/2014   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   s/p excision   Syncope 02/03/2013   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   Past Surgical History:  Procedure Laterality Date   APPENDECTOMY  05/2011   CARDIAC CATHETERIZATION  01/03/12   30% ostial LAD stenosis, ostial 30-40% D1 stenosis, smooth and normal left main and RCA; LVEF 65%   CHOLECYSTECTOMY N/A 08/25/2014   Procedure: LAPAROSCOPIC CHOLECYSTECTOMY WITH INTRAOPERATIVE CHOLANGIOGRAM;  Surgeon: [name], MD;  Location: [place];  Service: General;   Laterality: N/A;   INGUINAL HERNIA REPAIR  07/2010   right   KNEE ARTHROPLASTY     bilat   LEFT HEART CATH AND CORONARY ANGIOGRAPHY N/A 10/01/2021   Procedure: LEFT HEART CATH AND CORONARY ANGIOGRAPHY;  Surgeon: Belva Crome, MD;  Location: [place] [facility];  Service: Cardiovascular;  Laterality: N/A;   LEFT HEART CATHETERIZATION WITH CORONARY ANGIOGRAM N/A 01/03/2012   Procedure: LEFT HEART CATHETERIZATION WITH CORONARY ANGIOGRAM;  Surgeon: [name], MD;  Location: [facility] CATH LAB;  Service: Cardiovascular;  Laterality: N/A;   MRI  10/31/2016   TONSILLECTOMY  1947   tumor removal  1958   "fatty tumor"    Allergies  Allergies  Allergen Reactions   Sulfa Antibiotics Other (See Comments)    Childhood allergy    History of Present Illness    James [name]  is a 81 year old male with the above mention past medical history who presents today for complaint of chest pain and lightheadedness.  James [name] was initially seen by Dr. Acie Fredrickson in 2013 for complaint of chest pain.  Patient had left heart cath in 2008 that revealed 40% proximal LAD stenosis and stress Myoview completed 07/2010 with normal perfusion and no evidence of ischemia.  He underwent LHC 2013 that showed 30% ostial LAD stenosis, ostial 30-40% D1 stenosis, smooth and normal left main and RCA; LVEF 65%.  Patient was lost to follow-up until 2021 when he presented for surgical clearance.  Patient had Myoview performed 02/2020 that showed  no evidence of ischemia or infarct.  In 2023 patient had exercise stress test that showed ST depression in anterior leads and was sent for left heart cath for further evaluation.  Cath completed and revealed normal coronaries with right dominant anatomy and EF of 55% with dilated aorta.  Patient also had a 3-day ZIO monitor placed for palpitations that showed occasional PACs and PVCs.  Cardiac MR was completed for evaluation of aortic aneurysm seen during cardiac cath.  Results showed mild dilation with  41 mm dilation present.  James [name] presents today with his wife for follow-up.  Since last being seen in the office patient reports that he has noticed chest pain and increased lightheadedness.  He reports these symptoms occurred after workout.  He reports the pain is similar to previous chest discomfort that he has felt in the past.  He describes the pain as a waxing waning discomfort that bothers him mainly in the day and is not bothersome at night.  He is staying hydrated and has patient denies chest pain, palpitations, dyspnea, PND, orthopnea, nausea, vomiting, dizziness, syncope, edema, weight gain, or early satiety.  Home Medications    Current Outpatient Medications  Medication Sig Dispense Refill   ASHWAGANDHA PO Take 1 tablet by mouth daily.     atorvastatin (LIPITOR) 20 MG tablet Take 1 tablet (20 mg total) by mouth daily. 90 tablet 3   Calcium Carb-Cholecalciferol (CALCIUM 1000 + D PO) Take 1 tablet by mouth 3 (three) times a week.      COLLAGEN PO Take 1 capsule by mouth daily. (Chewable - has hyaluronic acid, collagen and biotin)     Cyanocobalamin (VITAMIN B 12 PO) Take 3,000 mcg by mouth daily.     diclofenac Sodium (VOLTAREN) 1 % GEL Apply 4 g topically 4 (four) times daily. To affected joint. 100 g 11   Dutasteride-Tamsulosin HCl 0.5-0.4 MG CAPS TAKE 1 CAPSULE BY MOUTH AT BEDTIME 90 capsule 1   ELDERBERRY PO Take 50 mg by mouth 3 (three) times a week.     levothyroxine (SYNTHROID) 100 MCG tablet TAKE 1 TABLET BY MOUTH EVERY MORNING BEFORE BREAKFAST 90 tablet 1   LORazepam (ATIVAN) 0.5 MG tablet Take 1 tablet (0.5 mg total) by mouth 2 (two) times daily as needed. for anxiety 30 tablet 1   Melatonin 10 MG TABS Take 10 mg by mouth at bedtime.     metoprolol succinate (TOPROL XL) 25 MG 24 hr tablet Take 0.5 tablets (12.5 mg total) by mouth at bedtime. 90 tablet 3   Milk Thistle 1000 MG CAPS Take 1,000 mg by mouth in the morning.     mirtazapine (REMERON) 15 MG tablet TAKE 1 TABLET BY  MOUTH AT BEDTIME 90 tablet 1   Multiple Vitamins-Minerals (MULTIVITAMINS THER. W/MINERALS) TABS Take 1 tablet by mouth daily.     pantoprazole (PROTONIX) 40 MG tablet TAKE 1 TABLET BY MOUTH 2 TIMES A DAY BEFORE MEALS FOR ACID REFLUX 180 tablet 3   Probiotic Product (PROBIOTIC DAILY PO) Take 1 tablet by mouth daily.     Testosterone 30 MG/ACT SOLN Apply 1 Pump topically daily. Five days a week 180 mL 0   Turmeric (QC TUMERIC COMPLEX) 500 MG CAPS Take 1 tablet by mouth daily.     zolpidem (AMBIEN) 10 MG tablet TAKE 1 TABLET BY MOUTH AT BEDTIME AS NEEDED FOR SLEEP 30 tablet 5   isosorbide mononitrate (IMDUR) 30 MG 24 hr tablet Take 1 tablet (30 mg total) by mouth daily.  90 tablet 3   No current facility-administered medications for this visit.     Review of Systems  Please see the history of present illness.    (+) Lightheadedness (+) Dizzy , chest discomfort  All other systems reviewed and are otherwise negative except as noted above.  Physical Exam    Wt Readings from Last 3 Encounters:  03/15/22 158 lb 12.8 oz (72 kg)  10/01/21 152 lb (68.9 kg)  09/28/21 152 lb (68.9 kg)   VS: Vitals:   03/15/22 1359 03/15/22 1447  BP: (!) 144/82 132/78  Pulse: 66   SpO2: 98%   ,Body mass index is 22.79 kg/m.  Constitutional:      Appearance: Healthy appearance. Not in distress.  Neck:     Vascular: JVD normal.  Pulmonary:     Effort: Pulmonary effort is normal.     Breath sounds: No wheezing. No rales. Diminished in the bases Cardiovascular:     Normal rate. Regular rhythm. Normal S1. Normal S2.      Murmurs: There is no murmur.  Edema:    Peripheral edema absent.  Abdominal:     Palpations: Abdomen is soft non tender. There is no hepatomegaly.  Skin:    General: Skin is warm and dry.  Neurological:     General: No focal deficit present.     Mental Status: Alert and oriented to person, place and time.     Cranial Nerves: Cranial nerves are intact.  EKG/LABS/Other Studies  Reviewed    ECG personally reviewed by me today -sinus rhythm with rate of 66 bpm PACs/PVCs   Lab Results  Component Value Date   WBC 6.0 09/29/2021   HGB 14.8 09/29/2021   HCT 42.9 09/29/2021   MCV 93 09/29/2021   PLT 207 09/29/2021   Lab Results  Component Value Date   CREATININE 0.87 09/29/2021   BUN 12 09/29/2021   NA 136 09/29/2021   K 4.4 09/29/2021   CL 98 09/29/2021   CO2 30 (H) 09/29/2021   Lab Results  Component Value Date   ALT 14 07/15/2021   AST 21 07/15/2021   ALKPHOS 54 07/15/2021   BILITOT 1.1 07/15/2021   Lab Results  Component Value Date   CHOL 125 07/15/2021   HDL 57.80 07/15/2021   LDLCALC 53 07/15/2021   TRIG 71.0 07/15/2021   CHOLHDL 2 07/15/2021    No results found for: "HGBA1C"  Assessment & Plan    1.  Nonobstructive CAD/stable angina: -s/p LHC performed 2023 showing normal coronaries and ascending aneurysm -Today patient reports chest discomfort that is similar to his previous anginal equivalent. -Continue GDMT with Lipitor 20 mg, and Toprol 12.5 mg daily -We will have patient increase Imdur to 30 mg daily -We will also arrange for Lexiscan Myoview to rule out possible ischemia related to chest discomfort  2.  History of AAA: -Surveillance MRI completed 10/2021  3.  Hyperlipidemia: Patient's LDL cholesterol was 53 -Continue Lipitor as noted above  4.  Essential hypertension: -Patient's blood pressure today was slightly elevated at 144/82 and was 132/78 on recheck -Continue metoprolol 12.5 mg daily  4.  Lightheadedness: -Patient reports today that lightheadedness ongoing -We will have him hold his sildenafil and patient was advised to check blood pressures at home and increase hydration   Disposition: Follow-up with Freada Bergeron, MD or APP in 2 weeks Shared Decision Making/Informed Consent The risks [chest pain, shortness of breath, cardiac arrhythmias, dizziness, blood pressure fluctuations, myocardial infarction,  stroke/transient  ischemic attack, nausea, vomiting, allergic reaction, radiation exposure, metallic taste sensation and life-threatening complications (estimated to be 1 in 10,000)], benefits (risk stratification, diagnosing coronary artery disease, treatment guidance) and alternatives of a nuclear stress test were discussed in detail with James [name] and he agrees to proceed.   Medication Adjustments/Labs and Tests Ordered: Current medicines are reviewed at length with the patient today.  Concerns regarding medicines are outlined above.   Signed, Mable Fill, Marissa Nestle, NP 03/15/2022, 2:49 PM [facility]  Note:  This document was prepared using Dragon voice recognition software and may include unintentional dictation errors.

## 2022-03-15 ENCOUNTER — Encounter: Payer: Self-pay | Admitting: Nurse Practitioner

## 2022-03-15 ENCOUNTER — Ambulatory Visit: Payer: Medicare Other | Attending: Nurse Practitioner | Admitting: Nurse Practitioner

## 2022-03-15 VITALS — BP 132/78 | HR 66 | Ht 70.0 in | Wt 158.8 lb

## 2022-03-15 DIAGNOSIS — E785 Hyperlipidemia, unspecified: Secondary | ICD-10-CM

## 2022-03-15 DIAGNOSIS — I2583 Coronary atherosclerosis due to lipid rich plaque: Secondary | ICD-10-CM | POA: Insufficient documentation

## 2022-03-15 DIAGNOSIS — I2510 Atherosclerotic heart disease of native coronary artery without angina pectoris: Secondary | ICD-10-CM | POA: Diagnosis not present

## 2022-03-15 DIAGNOSIS — R42 Dizziness and giddiness: Secondary | ICD-10-CM | POA: Diagnosis not present

## 2022-03-15 DIAGNOSIS — I714 Abdominal aortic aneurysm, without rupture, unspecified: Secondary | ICD-10-CM | POA: Diagnosis not present

## 2022-03-15 MED ORDER — ISOSORBIDE MONONITRATE ER 30 MG PO TB24: 30.0000 mg | ORAL_TABLET | Freq: Every day | ORAL | 3 refills | Status: DC

## 2022-03-15 NOTE — Patient Instructions (Addendum)
Medication Instructions:  Your physician has recommended you make the following change in your medication:  1-Increase Imdur 30 mg by mouth daily.  2- Stop sildenafil   *If you need a refill on your cardiac medications before your next appointment, please call your pharmacy*   Lab Work: If you have labs (blood work) drawn today and your tests are completely normal, you will receive your results only by: [place] (if you have MyChart) OR A paper copy in the mail If you have any lab test that is abnormal or we need to change your treatment, we will call you to review the results.  Testing/Procedures: Your physician has requested that you have en exercise stress myoview. For further information please visit [email or web address]. Please follow instruction sheet, as given.  Follow-Up: At [facility], you and your health needs are our priority.  As part of our continuing mission to provide you with exceptional heart care, we have created designated Provider Care Teams.  These Care Teams include your primary Cardiologist (physician) and Advanced Practice Providers (APPs -  Physician Assistants and Nurse Practitioners) who all work together to provide you with the care you need, when you need it.  We recommend signing up for the patient portal called "MyChart".  Sign up information is provided on this After Visit Summary.  MyChart is used to connect with patients for Virtual Visits (Telemedicine).  Patients are able to view lab/test results, encounter notes, upcoming appointments, etc.  Non-urgent messages can be sent to your provider as well.   To learn more about what you can do with MyChart, go to [email or web address].    Your next appointment:   2 week(s)  The format for your next appointment:   In Person  Provider:   Freada Bergeron, MD      Important Information About Sugar

## 2022-03-16 ENCOUNTER — Telehealth (HOSPITAL_COMMUNITY): Payer: Self-pay | Admitting: *Deleted

## 2022-03-16 NOTE — Telephone Encounter (Signed)
Patient given detailed instructions per Myocardial Perfusion Study Information Sheet for the test on 03/23/22 Patient notified to arrive 15 minutes early and that it is imperative to arrive on time for appointment to keep from having the test rescheduled.  If you need to cancel or reschedule your appointment, please call the office within 24 hours of your appointment. . Patient verbalized understanding. Kirstie Peri

## 2022-03-17 ENCOUNTER — Other Ambulatory Visit: Payer: Self-pay | Admitting: Internal Medicine

## 2022-03-17 DIAGNOSIS — I10 Essential (primary) hypertension: Secondary | ICD-10-CM

## 2022-03-17 NOTE — Telephone Encounter (Signed)
Requesting: testosterone  Contract: n/a UDS: n/a  Last Visit:09/06/21 Next Visit: None Last Refill:12/10/21 #141m and 0RF   Please Advise

## 2022-03-17 NOTE — Telephone Encounter (Signed)
Chest discomfort and lightheadedness back Received: Today James Bergeron, MD  Nuala Alpha, LPN; Marylu Lund., NP Can we get him in with EP? He has exercise induced arrhythmias based on his prior ETT.   Referral to EP placed and pt aware that EP [facility], will be calling him soon to coordinate this appt.   Will send a staff message to [place] now, to call and arrange.

## 2022-03-17 NOTE — Telephone Encounter (Signed)
Pt is scheduled for his EP Consult appt to see Dr. Myles Gip on 04/04/22 at 2:45 pm.  Pt made aware of appt date and time by EP Scheduler.   Will send this message to Dr. Johney Frame to make her aware of EP appt date and time.

## 2022-03-18 ENCOUNTER — Telehealth: Payer: Self-pay | Admitting: Pharmacist

## 2022-03-18 NOTE — Telephone Encounter (Signed)
Patient called to ask about new dose of isosorbide '30mg'$ . He was wondering if he could still take sildenafil '20mg'$  as long as he separated it from his isosorbide dose.  Explained that both medications dilate blood vessels and that taking them together could cause hypotension / low blood pressure, dizziness, syncope / passing out or near passing out, palpitations and other heart issues.  This could occur even if he did not take them at the same time because isosorbide is an extended release / long acting medication.

## 2022-03-18 NOTE — Telephone Encounter (Signed)
Stress test canceled. Will send a message to MD to see if 03/29/22 OV is needed.

## 2022-03-23 ENCOUNTER — Encounter (HOSPITAL_COMMUNITY): Payer: Medicare Other

## 2022-03-29 ENCOUNTER — Ambulatory Visit: Payer: Medicare Other | Admitting: Cardiology

## 2022-03-31 ENCOUNTER — Other Ambulatory Visit: Payer: Self-pay | Admitting: Family Medicine

## 2022-04-03 NOTE — Progress Notes (Unsigned)
Cardiology Office Note:    Date:  04/03/2022   ID:  James [name], DOB [birth year], MRN [id number]  PCP:  [name], MD   [place] [facility] Cardiologist:  Freada Bergeron, MD Electrophysiologist:  Melida Quitter, MD { Click to update primary MD,subspecialty MD or APP then REFRESH:1}    Referring MD: Freada Bergeron, MD   Chief complaint: ***  History of Present Illness:    James [name] is a 81 y.o. 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   Benign paroxysmal positional vertigo 04/06/2013   CAD (coronary artery disease)    nonobstructive by cath 2008; nonobstructive by Cardiac CT 11/2011   Cervical spine fracture ([place]) 1980   C6-7   Diverticulosis    Erectile dysfunction 09/08/2013   GERD (gastroesophageal reflux disease)    Hiatal hernia    History of atrial fibrillation    History of squamous cell carcinoma 10-05-10   Dr [name], [place] (hyperlipidemia)    Hypothyroidism    Medicare annual wellness visit, subsequent 03/16/2014   Sees [facility] Follows with Dr Johnsie Cancel of cardiology Sees [place] for colonoscopy, last colonoscopy in 2013    Mitral valve prolapse    Neck pain 05/03/2015   Preventative health care 03/16/2014   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   s/p excision   Syncope 02/03/2013   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    Past Surgical History:  Procedure Laterality Date   APPENDECTOMY  05/2011   CARDIAC CATHETERIZATION  01/03/12   30% ostial LAD stenosis, ostial 30-40% D1 stenosis, smooth  and normal left main and RCA; LVEF 65%   CHOLECYSTECTOMY N/A 08/25/2014   Procedure: LAPAROSCOPIC CHOLECYSTECTOMY WITH INTRAOPERATIVE CHOLANGIOGRAM;  Surgeon: [name], MD;  Location: [place];  Service: General;  Laterality: N/A;   INGUINAL HERNIA REPAIR  07/2010   right   KNEE ARTHROPLASTY     bilat   LEFT HEART CATH AND CORONARY ANGIOGRAPHY N/A 10/01/2021   Procedure: LEFT HEART CATH AND CORONARY ANGIOGRAPHY;  Surgeon: Belva Crome, MD;  Location: [place] [facility];  Service: Cardiovascular;  Laterality: N/A;   LEFT HEART CATHETERIZATION WITH CORONARY ANGIOGRAM N/A 01/03/2012   Procedure: LEFT HEART CATHETERIZATION WITH CORONARY ANGIOGRAM;  Surgeon: [name], MD;  Location: [facility] CATH LAB;  Service: Cardiovascular;  Laterality: N/A;   MRI  10/31/2016   TONSILLECTOMY  1947   tumor removal  1958   "fatty tumor"    Current Medications: No outpatient medications have been marked as taking for the 04/04/22 encounter (Appointment) with Nina Hoar, Yetta Barre, MD.     Allergies:   Sulfa antibiotics   Social History   Socioeconomic History   Marital status: Single    Spouse name: Not on file   Number of children: Not on file   Years of education: Not on file   Highest education level: Not on file  Occupational History   Occupation: Chief Financial Officer    Comment: Retired  Tobacco Use   Smoking status: Never   Smokeless tobacco: Never  Vaping Use  Vaping Use: Never used  Substance and Sexual Activity   Alcohol use: Yes    Comment: 2 glasses red wine/day   Drug use: No   Sexual activity: Yes  Other Topics Concern   Not on file  Social History Narrative   Not on file   Social Determinants of Health   Financial Resource Strain: Low Risk  (09/23/2021)   Overall Financial Resource Strain (CARDIA)    Difficulty of Paying Living Expenses: Not hard at all  Food Insecurity: No Food Insecurity (09/23/2021)   Hunger Vital Sign    Worried About Running Out of Food in the Last Year: Never  true    Ran Out of Food in the Last Year: Never true  Transportation Needs: No Transportation Needs (09/23/2021)   PRAPARE - Hydrologist (Medical): No    Lack of Transportation (Non-Medical): No  Physical Activity: Sufficiently Active (01/26/2022)   Exercise Vital Sign    Days of Exercise per Week: 4 days    Minutes of Exercise per Session: 60 min  Stress: No Stress Concern Present (09/23/2021)   [place]    Feeling of Stress : Not at all  Social Connections: Moderately Integrated (01/26/2022)   Social Connection and Isolation Panel [NHANES]    Frequency of Communication with Friends and Family: Three times a week    Frequency of Social Gatherings with Friends and Family: Three times a week    Attends Religious Services: More than 4 times per year    Active Member of Clubs or Organizations: Yes    Attends Archivist Meetings: 1 to 4 times per year    Marital Status: Never married     Family History: The patient's family history includes Cancer in his paternal uncle and sister; Heart disease in his maternal grandfather, maternal grandmother, and mother; Hypertension in his mother; Lung disease in his father; Stroke in his mother.  ROS:   Please see the history of present illness.    All other systems reviewed and are negative.  EKGs/Labs/Other Studies Reviewed:    Exercise stress test 09/28/2021 10.7 Mets, exercised for 9 minutes and 25 seconds. No symptoms during the test. St depression was noted. Test was stopped due to arrhythmia -- there were two brief runs of SVT lasting 2-3 seconds and occasional PVCs.  Coronary angiogram 10/01/2021 Angiographically normal coronary arteries Right dominant anatomy Normal LV function with normal LVEDP.  EF 55%. Dilated aorta   Cardiac MRI 1. Normal left ventricular chamber size and function, LVEF 55%. No LV delayed myocardial enhancement or  myocardial edema.   2. Normal right ventricular chamber size with borderline reduced RV systolic function, RVEF 17%. Borderline global RV hypokinesis.   3. There is delayed myocardial enhancement in an RV papillary muscle, likely the anterior papillary muscle. No RV free wall myocardial delayed enhancement.   4.  Mild aortic valve regurgitation, regurgitant fraction 11%.   5. Mild dilation of ascending aorta, approximately 41 mm in axial plane.    ZioPatch 10/2021: Patient had a min HR of 36 bpm, max HR of 133 bpm, and avg HR of 51 bpm. Predominant underlying rhythm was Sinus Rhythm. 7 Supraventricular Tachycardia runs occurred, the run with the fastest interval lasting 4 beats with a max rate of 133 bpm, the  longest lasting 11 beats with an avg rate of 96 bpm. Isolated SVEs were occasional (1.1%, 2430), SVE Couplets were rare (<1.0%, 462),  and SVE Triplets were rare (<1.0%, 81). Isolated VEs were rare (<1.0%, 1205), VE Couplets were rare (<1.0%, 8), and VE  Triplets were rare (<1.0%, 2).   EKG:  Last EKG results: ***   Recent Labs: 07/15/2021: ALT 14; TSH 2.97 09/29/2021: BUN 12; Creatinine, Ser 0.87; Hemoglobin 14.8; Platelets 207; Potassium 4.4; Sodium 136    Risk Assessment/Calculations:   {Does this patient have ATRIAL FIBRILLATION?:[phone]}  No BP recorded.  {Refresh Note OR Click here to enter BP  :1}***         Physical Exam:    VS:  There were no vitals taken for this visit.    Wt Readings from Last 3 Encounters:  03/15/22 158 lb 12.8 oz (72 kg)  10/01/21 152 lb (68.9 kg)  09/28/21 152 lb (68.9 kg)     GEN: *** Well nourished, well developed in no acute distress CARDIAC: ***RRR, no murmurs, rubs, gallops RESPIRATORY:  Normal work of breathing MUSCULOSKELETAL: *** edema    ASSESSMENT & PLAN:    Exercise induced chest-pain: non-ischemic as he had a normal cath within the past year.      {Are you ordering a CV Procedure (e.g. stress test, cath, DCCV,  TEE, etc)?   Press F2        :[id number]}    Medication Adjustments/Labs and Tests Ordered: Current medicines are reviewed at length with the patient today.  Concerns regarding medicines are outlined above.  No orders of the defined types were placed in this encounter.  No orders of the defined types were placed in this encounter.    Signed, Melida Quitter, MD  04/03/2022 8:10 PM    [place]

## 2022-04-04 ENCOUNTER — Ambulatory Visit: Payer: Medicare Other | Attending: Cardiovascular Disease | Admitting: Cardiovascular Disease

## 2022-04-04 ENCOUNTER — Encounter: Payer: Self-pay | Admitting: Cardiovascular Disease

## 2022-04-04 VITALS — BP 124/70 | HR 51 | Ht 70.0 in | Wt 161.0 lb

## 2022-04-04 DIAGNOSIS — I480 Paroxysmal atrial fibrillation: Secondary | ICD-10-CM

## 2022-04-04 NOTE — Patient Instructions (Signed)
Medication Instructions:  Your physician recommends that you continue on your current medications as directed. Please refer to the Current Medication list given to you today.  *If you need a refill on your cardiac medications before your next appointment, please call your pharmacy*   Follow-Up: At  [facility]Care, you and your health needs are our priority.  As part of our continuing mission to provide you with exceptional heart care, we have created designated Provider Care Teams.  These Care Teams include your primary Cardiologist (physician) and Advanced Practice Providers (APPs -  Physician Assistants and Nurse Practitioners) who all work together to provide you with the care you need, when you need it.   Your next appointment:   6 month(s)  The format for your next appointment:   In Person  Provider:   You may see Augustus E Mealor, MD or one of the following Advanced Practice Providers on your designated Care Team:   Renee Ursuy, PA-C Michael "Andy" Tillery, PA-C    Important Information About Sugar       

## 2022-04-08 NOTE — Progress Notes (Unsigned)
Cardiology Office Note:    Date:  04/08/2022   ID:  DAILY CRATE, DOB 1940/12/30, MRN [id number]  PCP:  [name], MD  [facility] Cardiologist:  Freada Bergeron, MD  [facility] Electrophysiologist:  Melida Quitter, MD   Referring MD: [name], MD    History of Present Illness:    James [name] is a 81 y.o. male with a hx of AAA, nonobstructive CAD, and HLD who presents to clinic for follow-up.    Patient was in ED on 09/18/2 after running 5K where he developed significant lightheadedness.No LOC. Just felt weak and exhausted. No chest pain/pressure or SOB. Had diarrhea for several weeks prior leading up to the run having on average 6 episodes per day. ECG with NSR without ischemic changes.Trop 29-->32. Thought to be due to dehydration.  He presented to Cardiology clinic on 03/17/20 for pre-operative clearance for colonoscopy. Myoview was negative for ischemia. EF normal. TTE with normal BiV function, no significant valvular abnormalities. Colonoscopy with lymphocytic colitis.  Polyp was benign. He was given medication to take for 3 months and his symptoms have since resolved.   Last seen in clinic on 05/2020 where he was doing very well from a CV standpoint. Remained very active.  Today, the patient states he has begun to feel lightheaded with exertion. Specifically, he started power walking in July 2022 and started to feel lightheaded during exercise. His symptoms were mild but then progressively worsened since that time period such that he is often forced to get on his knees to recover to ensure he does not syncopize. He states that he remains hydrated and tries to eat before he works out. Notably he has cut back on carbohydrates significantly and has lost a mild amount of weight but believes he is getting adequate nutrition. He also takes several supplements which he has stopped about 4 days ago to see if his symptoms improve. Not on HTN medications or nodal  agents. Takes tamsulosin but this is a long term med. Has occasional chest discomfort that can occur at rest or with exertion. No SOB, orthopnea, PND.  States that his HR goes up to 150s with exertion but does not know his blood pressure. Currently in 110/60s in clinic  He denies any palpitations, shortness of breath, or peripheral edema. No headaches, orthopnea, or PND.   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   Benign paroxysmal positional vertigo 04/06/2013   CAD (coronary artery disease)    nonobstructive by cath 2008; nonobstructive by Cardiac CT 11/2011   Cervical spine fracture ([place]) 1980   C6-7   Diverticulosis    Erectile dysfunction 09/08/2013   GERD (gastroesophageal reflux disease)    Hiatal hernia    History of atrial fibrillation    History of squamous cell carcinoma 10-05-10   Dr [name], [place] (hyperlipidemia)    Hypothyroidism    Medicare annual wellness visit, subsequent 03/16/2014   Sees [place] orth[facility] Follows with Dr Johnsie Cancel of cardiology Sees [place] for colonoscopy, last colonoscopy in 2013    Mitral valve prolapse    Neck pain 05/03/2015   Preventative health care 03/16/2014   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   s/p excision   Syncope 02/03/2013  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    Past Surgical History:  Procedure Laterality Date   APPENDECTOMY  05/2011   CARDIAC CATHETERIZATION  01/03/12   30% ostial LAD stenosis, ostial 30-40% D1 stenosis, smooth and normal left main and RCA; LVEF 65%   CHOLECYSTECTOMY N/A 08/25/2014   Procedure: LAPAROSCOPIC CHOLECYSTECTOMY WITH INTRAOPERATIVE CHOLANGIOGRAM;  Surgeon: [name], MD;  Location: [facility];  Service: General;  Laterality: N/A;   INGUINAL HERNIA REPAIR  07/2010   right   KNEE  ARTHROPLASTY     bilat   LEFT HEART CATH AND CORONARY ANGIOGRAPHY N/A 10/01/2021   Procedure: LEFT HEART CATH AND CORONARY ANGIOGRAPHY;  Surgeon: Belva Crome, MD;  Location: [facility] CV [facility];  Service: Cardiovascular;  Laterality: N/A;   LEFT HEART CATHETERIZATION WITH CORONARY ANGIOGRAM N/A 01/03/2012   Procedure: LEFT HEART CATHETERIZATION WITH CORONARY ANGIOGRAM;  Surgeon: [name], MD;  Location[facility] CATH LAB;  Service: Cardiovascular;  Laterality: N/A;   MRI  10/31/2016   TONSILLECTOMY  1947   tumor removal  1958   "fatty tumor"    Current Medications: No outpatient medications have been marked as taking for the 04/11/22 encounter (Appointment) with Freada Bergeron, MD.     Allergies:   Sulfa antibiotics   Social History   Socioeconomic History   Marital status: Single    Spouse name: Not on file   Number of children: Not on file   Years of education: Not on file   Highest education level: Not on file  Occupational History   Occupation: Chief Financial Officer    Comment: Retired  Tobacco Use   Smoking status: Never   Smokeless tobacco: Never  Vaping Use   Vaping Use: Never used  Substance and Sexual Activity   Alcohol use: Yes    Comment: 2 glasses red wine/day   Drug use: No   Sexual activity: Yes  Other Topics Concern   Not on file  Social History Narrative   Not on file   Social Determinants of Health   Financial Resource Strain: [place]  (09/23/2021)   Overall Financial Resource Strain (CARDIA)    Difficulty of Paying Living Expenses: Not hard at all  Food Insecurity: No Food Insecurity (09/23/2021)   Hunger Vital Sign    Worried About Running Out of Food in the Last Year: Never true    [place] in the Last Year: Never true  Transportation Needs: No Transportation Needs (09/23/2021)   PRAPARE - Hydrologist (Medical): No    Lack of Transportation (Non-Medical): No  Physical Activity: Sufficiently Active (01/26/2022)    Exercise Vital Sign    Days of Exercise per Week: 4 days    Minutes of Exercise per Session: 60 min  Stress: No Stress Concern Present (09/23/2021)   [place]    Feeling of Stress : Not at all  Social Connections: Moderately Integrated (01/26/2022)   Social Connection and Isolation Panel [NHANES]    Frequency of Communication with Friends and Family: Three times a week    Frequency of Social Gatherings with Friends and Family: Three times a week    Attends Religious Services: More than 4 times per year    Active Member of Clubs or Organizations: Yes    Attends Archivist Meetings: 1 to 4 times per year    Marital Status: Never married     Family History: The patient's family history includes  Cancer in his paternal uncle and sister; Heart disease in his maternal grandfather, maternal grandmother, and mother; Hypertension in his mother; Lung disease in his father; Stroke in his mother.  ROS:   Please see the history of present illness.    Review of Systems  Constitutional:  Negative for chills, fever and weight loss.  HENT:  Negative for nosebleeds.   Eyes:  Negative for blurred vision.  Respiratory:  Negative for shortness of breath.   Cardiovascular:  Positive for chest pain. Negative for palpitations, orthopnea, claudication, leg swelling and PND.  Gastrointestinal:  Negative for heartburn, nausea and vomiting.  Genitourinary:  Negative for frequency.  Musculoskeletal:  Negative for falls and myalgias.  Neurological:  Positive for dizziness. Negative for loss of consciousness.  Endo/Heme/Allergies:  Negative for polydipsia.  Psychiatric/Behavioral:  Negative for substance abuse.     EKGs/Labs/Other Studies Reviewed:    The following studies were reviewed today:   Myoview 03/19/20: The left ventricular ejection fraction is normal (55-65%). Nuclear stress EF: 63%. There was no ST segment deviation  noted during stress. The study is normal. This is a low risk study.   Normal pharmacologic nuclear stress test with no evidence for prior infarct or ischemia. Normal LVEF.  TTE 04/01/20: IMPRESSIONS   1. Left ventricular ejection fraction, by estimation, is 60 to 65%. The  left ventricle has normal function. The left ventricle has no regional  wall motion abnormalities. Left ventricular diastolic parameters are  indeterminate.   2. Right ventricular systolic function is normal. The right ventricular  size is normal. There is normal pulmonary artery systolic pressure.   3. Left atrial size was mildly dilated.   4. Right atrial size was mildly dilated.   5. The mitral valve is grossly normal. Trivial mitral valve  regurgitation.   6. The aortic valve is normal in structure. Aortic valve regurgitation is  mild. No aortic stenosis is present.   7. Aortic dilatation noted. There is mild to moderate dilatation of the  ascending aorta, measuring 41 mm.  Abdominal Ultrasound 04/01/20: Summary:  Abdominal Aorta: No evidence of an abdominal aortic aneurysm was  visualized. The largest aortic measurement is 2.6 cm. The largest aortic  diameter has decreased compared to prior exam. Previous diameter  measurement was 3.0 cm obtained on 2016.   IVC/Iliac: There is no evidence of thrombus involving the IVC.  Carotid ultrasound 01/2013 1-39% bilteral stenosis  EKG: EKG is personally reviewed. 09/28/2021: Sinus rhythm. Rate 61 bpm.   Recent Labs: 07/15/2021: ALT 14; TSH 2.97 09/29/2021: BUN 12; Creatinine, Ser 0.87; Hemoglobin 14.8; Platelets 207; Potassium 4.4; Sodium 136  Recent Lipid Panel    Component Value Date/Time   CHOL 125 07/15/2021 0829   TRIG 71.0 07/15/2021 0829   HDL 57.80 07/15/2021 0829   CHOLHDL 2 07/15/2021 0829   VLDL 14.2 07/15/2021 0829   LDLCALC 53 07/15/2021 0829   LDLCALC 46 07/09/2020 1526      Physical Exam:    VS:  There were no vitals taken for this  visit.    Wt Readings from Last 3 Encounters:  04/04/22 161 lb (73 kg)  03/15/22 158 lb 12.8 oz (72 kg)  10/01/21 152 lb (68.9 kg)     GEN:  Well nourished, well developed in no acute distress HEENT: Normal NECK: No JVD; No carotid bruits CARDIAC: RRR, 1/6 systolic murmur RESPIRATORY:  Clear to auscultation without rales, wheezing or rhonchi  ABDOMEN: Soft, non-tender, non-distended MUSCULOSKELETAL:  No edema; No deformity  SKIN: Warm and dry NEUROLOGIC:  Alert and oriented x 3 PSYCHIATRIC:  Normal affect   ASSESSMENT:    No diagnosis found.  PLAN:    In order of problems listed above:  #Lightheadedness with Exertion: Unknown etiology. Has history of mild ostial LAD disease in 2013 but negative myoview in 2021. HR augments with exercise per report from his watch with low suspicion for chronotropic incompetence. No known arrhythmias. TTE 2021 with normal BiV function and no significant valve disease. Notably, he has lost some weight and has cut back on carbohydrates and therefore it may be related to inadequate nutrition vs possible dehydration, however, he reports a well rounded diet and symptoms appear to be progressing. No known history of stroke. Will check perfusion PET as well as ETT to evaluate for ischemia and hypotension/arrhythmia with exertion respectively. -Check myocardial perfusion PET -Check ETT to assess for hypotension with exercise or arrhythmia given that symptoms occur with exertion -Check carotids given history of mild carotid artery disease in 2021 -Increase hydration and electrolyte repletion -Encouraged extra calories to ensure he is meeting his metabolic demand -Can consider stopping tamsulosin if above work-up unrevealing +/- referral to neuro -Stay off supplements for now while undergoing above work-up  #Non-obstructive CAD: Cath 2013 with LAD ostial 30-40% otherwise no significant disease. Myoview 2021 without ischemia. Currently with progressive  lightheadedness with exertion as detailed above. 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  -Continue medical management with ASA and statin -Repeat for monitoring   #Mild MR: Stable on repeat echo. -Continue to [facility]     Shared Decision Making/Informed Consent The risks [chest pain, shortness of breath, cardiac arrhythmias, dizziness, blood pressure fluctuations, myocardial infarction, stroke/transient ischemic attack, and life-threatening complications (estimated to be 1 in 10,000)], benefits (risk stratification, diagnosing coronary artery disease, treatment guidance) and alternatives of an exercise tolerance test were discussed in detail with Mr. Harrison and he agrees to proceed.  The risks [chest pain, shortness of breath, cardiac arrhythmias, dizziness, blood pressure fluctuations, myocardial infarction, stroke/transient ischemic attack, nausea, vomiting, allergic reaction, radiation exposure, metallic taste sensation and life-threatening complications (estimated to be 1 in 10,000)], benefits (risk stratification, diagnosing coronary artery disease, treatment guidance) and alternatives of a nuclear stress test were discussed in detail with Mr. Kalla and he agrees to proceed.    Medication Adjustments/Labs and Tests Ordered: Current medicines are reviewed at length with the patient today.  Concerns regarding medicines are outlined above.  No orders of the defined types were placed in this  encounter.  No orders of the defined types were placed in this encounter.   There are no Patient Instructions on file for this visit.   Signed, Freada Bergeron, MD  04/08/2022 8:41 AM    [facility]  Follow-up 6 months  Medication Adjustments/Labs and Tests Ordered: Current medicines are reviewed at length with the patient today.  Concerns regarding medicines are outlined above.  No orders of the defined types were placed in this encounter.  No orders of the defined  types were placed in this encounter.   There are no Patient Instructions on file for this visit.    I,Mathew Stumpf,acting as a Education administrator for Freada Bergeron, MD.,have documented all relevant documentation on the behalf of Freada Bergeron, MD,as directed by  Freada Bergeron, MD while in the presence of Freada Bergeron, MD.   I, Freada Bergeron, MD, have reviewed all documentation for this visit. The documentation on 04/08/22 for the exam, diagnosis, procedures, and orders are all accurate and complete.   Signed, Freada Bergeron, MD  04/08/2022 8:41 AM    [facility]

## 2022-04-11 ENCOUNTER — Telehealth: Payer: Self-pay

## 2022-04-11 ENCOUNTER — Ambulatory Visit: Payer: Medicare Other | Attending: Cardiology | Admitting: Cardiology

## 2022-04-11 ENCOUNTER — Encounter: Payer: Self-pay | Admitting: Cardiology

## 2022-04-11 VITALS — BP 98/72 | HR 56 | Ht 70.0 in | Wt 159.8 lb

## 2022-04-11 DIAGNOSIS — I2510 Atherosclerotic heart disease of native coronary artery without angina pectoris: Secondary | ICD-10-CM | POA: Diagnosis not present

## 2022-04-11 DIAGNOSIS — R42 Dizziness and giddiness: Secondary | ICD-10-CM

## 2022-04-11 DIAGNOSIS — I6523 Occlusion and stenosis of bilateral carotid arteries: Secondary | ICD-10-CM

## 2022-04-11 DIAGNOSIS — E785 Hyperlipidemia, unspecified: Secondary | ICD-10-CM | POA: Diagnosis not present

## 2022-04-11 DIAGNOSIS — I77819 Aortic ectasia, unspecified site: Secondary | ICD-10-CM

## 2022-04-11 DIAGNOSIS — I4719 Other supraventricular tachycardia: Secondary | ICD-10-CM | POA: Diagnosis not present

## 2022-04-11 NOTE — Telephone Encounter (Signed)
PA initiated via Covermymeds; KEY: [id number]. Awaiting determination.

## 2022-04-11 NOTE — Progress Notes (Signed)
Cardiology Office Note:    Date:  04/11/2022   ID:  James [name], DOB 06/07/1941, MRN [id number]  PCP:  [name], MD  [facility] Cardiologist:  Freada Bergeron, MD  [facility] Electrophysiologist:  Melida Quitter, MD   Referring MD: [name], MD    History of Present Illness:    James [name] is a 81 y.o. male with a hx of AAA, nonobstructive CAD, and HLD who presents to clinic for follow-up.    Patient was in ED on 09/18/2 after running 5K where he developed significant lightheadedness.No LOC. Just felt weak and exhausted. No chest pain/pressure or SOB. Had diarrhea for several weeks prior leading up to the run having on average 6 episodes per day. ECG with NSR without ischemic changes.Trop 29-->32. Thought to be due to dehydration.  He presented to Cardiology clinic on 03/17/20 for pre-operative clearance for colonoscopy. Myoview was negative for ischemia. EF normal. TTE with normal BiV function, no significant valvular abnormalities. Colonoscopy with lymphocytic colitis.  Polyp was benign. He was given medication to take for 3 months and his symptoms have since resolved.   He presented to clinic on 09/2021 where he was having significant lightheadedness with exertion with significant presyncope. ETT 09/2021 with ST depressions at peak exercise as well as runs of atach with exercise. [place] 09/2021 with clean coronaries. Cardiac monitor 10/2021 with 7 runs of nonsustained SVT, rare ectopy. CMR 10/2021 with normal EF, borderline RV systolic function, mild AR, mild aortic dilation 24m. He was placed on metop 12.'5mg'$  BID  Was seen back in clinic by EAmbrose [place] 02/2022 with increased lightheadedness thought to be due to atrial arrhythmias with exertion. Was seen by Dr. MMyles Gipwith EP and he was offered flec but the patient wished to continue with metop.  Today, he says he is feeling pretty well. He continues to have intermittent episodes of lightheadedness with  associated chest discomfort throughout the day. Can occur at rest and with activity. He feels his pulse at this time and notes it is irregular. Overall, these symptoms are manageable and not interfering with his daily life. He continues to take the metoprolol which has helped. Resting HR is in the 40-50s.  He denies any shortness of breath, or peripheral edema. No headaches, syncope, orthopnea, or PND.  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   Benign paroxysmal positional vertigo 04/06/2013   CAD (coronary artery disease)    nonobstructive by cath 2008; nonobstructive by Cardiac CT 11/2011   Cervical spine fracture ([facility] 1980   C6-7   Diverticulosis    Erectile dysfunction 09/08/2013   GERD (gastroesophageal reflux disease)    Hiatal hernia    History of atrial fibrillation    History of squamous cell carcinoma 10-05-10   Dr JWayna Chalet AWaller(hyperlipidemia)    Hypothyroidism    Medicare annual wellness visit, subsequent 03/16/2014   Sees [place] [facility] Follows with Dr NJohnsie [place]ancelof cardiology [place] last [place]oscopy in 2013    Mitral valve prolapse    Neck pain 05/03/2015   Preventative health care 03/16/2014   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   s/p excision   Syncope 02/03/2013   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  Past Surgical History:  Procedure Laterality Date   APPENDECTOMY  05/2011   CARDIAC CATHETERIZATION  01/03/12   30% ostial LAD stenosis, ostial 30-40% D1 stenosis, smooth and normal left main and RCA; LVEF 65%   CHOLECYSTECTOMY N/A 08/25/2014   Procedure: LAPAROSCOPIC CHOLECYSTECTOMY WITH INTRAOPERATIVE CHOLANGIOGRAM;  Surgeon: [name], MD;  Location: [place];  Service: General;  Laterality: N/A;   INGUINAL HERNIA  REPAIR  07/2010   right   KNEE ARTHROPLASTY     bilat   LEFT HEART CATH AND CORONARY ANGIOGRAPHY N/A 10/01/2021   Procedure: LEFT HEART CATH AND CORONARY ANGIOGRAPHY;  Surgeon: Belva Crome, MD;  Location: [place] [facility];  Service: Cardiovascular;  Laterality: N/A;   LEFT HEART CATHETERIZATION WITH CORONARY ANGIOGRAM N/A 01/03/2012   Procedure: LEFT HEART CATHETERIZATION WITH CORONARY ANGIOGRAM;  Surgeon: [name], MD;  Location: [facility] CATH LAB;  Service: Cardiovascular;  Laterality: N/A;   MRI  10/31/2016   TONSILLECTOMY  1947   tumor removal  1958   "fatty tumor"    Current Medications: Current Meds  Medication Sig   ASHWAGANDHA PO Take 1 tablet by mouth daily.   atorvastatin (LIPITOR) 20 MG tablet Take 1 tablet (20 mg total) by mouth daily.   Calcium Carb-Cholecalciferol (CALCIUM 1000 + D PO) Take 1 tablet by mouth 3 (three) times a week.    COLLAGEN PO Take 1 capsule by mouth daily. (Chewable - has hyaluronic acid, collagen and biotin)   Cyanocobalamin (VITAMIN B 12 PO) Take 3,000 mcg by mouth daily.   diclofenac Sodium (VOLTAREN) 1 % GEL Apply 4 g topically 4 (four) times daily. To affected joint.   Dutasteride-Tamsulosin HCl 0.5-0.4 MG CAPS TAKE 1 CAPSULE BY MOUTH AT BEDTIME   ELDERBERRY PO Take 50 mg by mouth 3 (three) times a week.   levothyroxine (SYNTHROID) 100 MCG tablet TAKE 1 TABLET BY MOUTH EVERY MORNING BEFORE BREAKFAST   LORazepam (ATIVAN) 0.5 MG tablet Take 1 tablet (0.5 mg total) by mouth 2 (two) times daily as needed. for anxiety   Melatonin 10 MG TABS Take 10 mg by mouth at bedtime.   metoprolol succinate (TOPROL XL) 25 MG 24 hr tablet Take 0.5 tablets (12.5 mg total) by mouth at bedtime.   Milk Thistle 1000 MG CAPS Take 1,000 mg by mouth in the morning.   mirtazapine (REMERON) 15 MG tablet Take 1 tablet (15 mg total) by mouth at bedtime.   Multiple Vitamins-Minerals (MULTIVITAMINS THER. W/MINERALS) TABS Take 1 tablet by mouth daily.   pantoprazole  (PROTONIX) 40 MG tablet TAKE 1 TABLET BY MOUTH 2 TIMES A DAY BEFORE MEALS FOR ACID REFLUX (Patient taking differently: 40 mg daily.)   Probiotic Product (PROBIOTIC DAILY PO) Take 1 tablet by mouth daily.   Testosterone 30 MG/ACT SOLN APPLY 1 PUMP TOPICALLY DAILY. FIVE DAYS A WEEK (Patient taking differently: Apply 1 Pump topically daily. Under each arm Five days a week)   Turmeric (QC TUMERIC COMPLEX) 500 MG CAPS Take 1 tablet by mouth daily.   zolpidem (AMBIEN) 10 MG tablet TAKE 1 TABLET BY MOUTH AT BEDTIME AS NEEDED FOR SLEEP   [DISCONTINUED] isosorbide mononitrate (IMDUR) 30 MG 24 hr tablet Take 1 tablet (30 mg total) by mouth daily.     Allergies:   Sulfa antibiotics   Social History   Socioeconomic History   Marital status: Single    Spouse name: Not on file   Number of children: Not on file   Years of education: Not on  file   Highest education level: Not on file  Occupational History   Occupation: Chief Financial Officer    Comment: Retired  Tobacco Use   Smoking status: Never   Smokeless tobacco: Never  Vaping Use   Vaping Use: Never used  Substance and Sexual Activity   Alcohol use: Yes    Comment: 2 glasses red wine/day   Drug use: No   Sexual activity: Yes  Other Topics Concern   Not on file  Social History Narrative   Not on file   Social Determinants of Health   Financial Resource Strain: Low Risk  (09/23/2021)   Overall Financial Resource Strain (CARDIA)    Difficulty of Paying Living Expenses: Not hard at all  Food Insecurity: No Food Insecurity (09/23/2021)   Hunger Vital Sign    Worried About Running Out of Food in the Last Year: Never true    Ran Out of Food in the Last Year: Never true  Transportation Needs: No Transportation Needs (09/23/2021)   PRAPARE - Hydrologist (Medical): No    Lack of Transportation (Non-Medical): No  Physical Activity: Sufficiently Active (01/26/2022)   Exercise Vital Sign    Days of Exercise per Week: 4 days     Minutes of Exercise per Session: 60 min  Stress: No Stress Concern Present (09/23/2021)   [place]    Feeling of Stress : Not at all  Social Connections: Moderately Integrated (01/26/2022)   Social Connection and Isolation Panel [NHANES]    Frequency of Communication with Friends and Family: Three times a week    Frequency of Social Gatherings with Friends and Family: Three times a week    Attends Religious Services: More than 4 times per year    Active Member of Clubs or Organizations: Yes    Attends Archivist Meetings: 1 to 4 times per year    Marital Status: Never married     Family History: The patient's family history includes Cancer in his paternal uncle and sister; Heart disease in his maternal grandfather, maternal grandmother, and mother; Hypertension in his mother; Lung disease in his father; Stroke in his mother.  ROS:   Please see the history of present illness.    Review of Systems  Constitutional:  Negative for chills, fever and weight loss.  HENT:  Negative for nosebleeds.   Eyes:  Negative for blurred vision.  Respiratory:  Negative for shortness of breath.   Cardiovascular:  Positive for chest pain. Negative for palpitations, orthopnea, claudication, leg swelling and PND.  Gastrointestinal:  Negative for blood in stool, heartburn, nausea and vomiting.  Genitourinary:  Negative for frequency and hematuria.  Musculoskeletal:  Negative for falls and myalgias.  Neurological:  Positive for dizziness. Negative for loss of consciousness.  Endo/Heme/Allergies:  Negative for polydipsia.  Psychiatric/Behavioral:  Negative for substance abuse.     EKGs/Labs/Other Studies Reviewed:    The following studies were reviewed today:  CMR 10/2021: FINDINGS: LEFT VENTRICLE:   Normal left ventricular chamber size.   Normal left ventricular wall thickness.   Normal left ventricular systolic function.    LVEF = 55%   There are no regional wall motion abnormalities.   No myocardial edema, T2 = 50 ms   First pass perfusion not performed.   There is no post contrast delayed myocardial enhancement in the left ventricle.   Normal T1 myocardial nulling kinetics suggest against a diagnosis of cardiac amyloidosis.  ECV = 27%, normal range.   RIGHT VENTRICLE:   Normal right ventricular chamber size.   Normal right ventricular wall thickness.   Borderline reduced right ventricular systolic function.   RVEF = 45%   There are no regional wall motion abnormalities. Borderline global hypokinesis, function best preserved at the base.   There is probable post contrast delayed myocardial enhancement in the RV papillary muscle, likely the anterior papillary muscle.   ATRIA:   Mild biatrial chamber enlargement.   VALVES:   Tricuspid aortic valve. Mild aortic valve regurgitation, regurgitant fraction 11.3%.   AORTA:   Mild dilation of ascending aorta, approximately 41 mm in axial plane.   PERICARDIUM:   Normal pericardium.  Trivial pericardial effusion.   OTHER: No significant extracardiac findings.   MEASUREMENTS: Left ventricle:   LV male   LV EF: 55% (normal 56-78%)   Absolute volumes:   LV EDV: 150 mL (Normal 77-195 mL)   LV ESV: 67 mL (Normal 19-72 mL)   LV SV: 83 mL (Normal 51-133 mL)   CO: 4.0 L/min (Normal 2.8-8.8 L/min)   Indexed volumes:   LV EDV: 82 mL/sq-m (Normal 47-92 mL/sq-m)   LV ESV: 36 mL/sq-m (Normal 13-30 mL/sq-m)   LV SV: 45 mL/sq-m (Normal 32-62 mL/sq-m)   CI: 2.15 L/min/sq-m (Normal 1.7-4.2 L/min/sq-m)   Right ventricle:   RV male   RV EF: 45 % (Normal 47-74%)   Absolute volumes:   RV EDV: 183 mL (Normal 88-227 mL)   RV ESV: 101 mL (Normal 23-103 mL)   RV SV: 82 mL (Normal 52-138 mL)   CO: 3.9 L/min (Normal 2.8-8.8 L/min)   Indexed volumes:   RV EDV: 99 mL/sq-m (Normal 55-105 mL/sq-m)   RV ESV: 55 mL/sq-m (Normal  15-43 mL/sq-m)   RV SV: 44 mL/sq-m (Normal 32-64 mL/sq-m)   CI: 2.10 L/min/sq-m (Normal 1.7-4.2 L/min/sq-m)   IMPRESSION: 1. Normal left ventricular chamber size and function, LVEF 55%. No LV delayed myocardial enhancement or myocardial edema.   2. Normal right ventricular chamber size with borderline reduced RV systolic function, RVEF 74%. Borderline global RV hypokinesis.   3. There is delayed myocardial enhancement in an RV papillary muscle, likely the anterior papillary muscle. No RV free wall myocardial delayed enhancement.   4.  Mild aortic valve regurgitation, regurgitant fraction 11%.   5. Mild dilation of ascending aorta, approximately 41 mm in axial plane.   No findings to suggest an infiltrative cardiomyopathic process. Cardiac monitor 10/2021: Patch wear time was 2 days and 22 hours Predominant rhythm was NSR with average HR 51bpm (36-133bpm) 7 runs of nonsustained SVT with longest lasting 11 beats Occasional SVE (1.1%), rare VE (<1%) Patient triggered events correlate with SVE and PVCs No sustained arrhythmias or significant pauses   LHC 10/01/21: CONCLUSIONS: Angiographically normal coronary arteries Right dominant anatomy Normal LV function with normal LVEDP.  EF 55%. Dilated aorta   Recommendations:   Ischemic EKG changes could be accounted for by decreased coronary vasodilator reserve/microvascular dysfunction in this elderly gentleman. Dilated aorta might cause consideration of aortic imaging to rule out aneurysm.  ETT 09/2021:  of horizontal ST depression (V4, V5 and V6) was noted.   Abnormal ECG treadmill stress test due to ischemic ST changes at peak exercise. Also noted is a marked increase in atrial arrhythmia with runs of nonsustained atrial tachycardia at peak exercise and during recovery. Cannot exclude brief atrial fibrillation at peak exercise.  Myoview 03/19/20: The left ventricular ejection fraction is normal (55-65%). Nuclear  stress  EF: 63%. There was no ST segment deviation noted during stress. The study is normal. This is a low risk study.   Normal pharmacologic nuclear stress test with no evidence for prior infarct or ischemia. Normal LVEF.  TTE 04/01/20: IMPRESSIONS   1. Left ventricular ejection fraction, by estimation, is 60 to 65%. The  left ventricle has normal function. The left ventricle has no regional  wall motion abnormalities. Left ventricular diastolic parameters are  indeterminate.   2. Right ventricular systolic function is normal. The right ventricular  size is normal. There is normal pulmonary artery systolic pressure.   3. Left atrial size was mildly dilated.   4. Right atrial size was mildly dilated.   5. The mitral valve is grossly normal. Trivial mitral valve  regurgitation.   6. The aortic valve is normal in structure. Aortic valve regurgitation is  mild. No aortic stenosis is present.   7. Aortic dilatation noted. There is mild to moderate dilatation of the  ascending aorta, measuring 41 mm.  Abdominal Ultrasound 04/01/20: Summary:  Abdominal Aorta: No evidence of an abdominal aortic aneurysm was  visualized. The largest aortic measurement is 2.6 cm. The largest aortic  diameter has decreased compared to prior exam. Previous diameter  measurement was 3.0 cm obtained on 2016.   IVC/Iliac: There is no evidence of thrombus involving the IVC.  Carotid ultrasound 01/2013 1-39% bilteral stenosis  EKG: EKG is personally reviewed. 04/11/22: EKG was not ordered. 09/28/2021: Sinus rhythm. Rate 61 bpm.   Recent Labs: 07/15/2021: ALT 14; TSH 2.97 09/29/2021: BUN 12; Creatinine, Ser 0.87; Hemoglobin 14.8; Platelets 207; Potassium 4.4; Sodium 136  Recent Lipid Panel    Component Value Date/Time   CHOL 125 07/15/2021 0829   TRIG 71.0 07/15/2021 0829   HDL 57.80 07/15/2021 0829   CHOLHDL 2 07/15/2021 0829   VLDL 14.2 07/15/2021 0829   LDLCALC 53 07/15/2021 0829   LDLCALC 46 07/09/2020  1526     Physical Exam:    VS:  BP 98/72 (BP Location: Left Arm, Patient Position: Sitting, Cuff Size: Normal)   Pulse (!) 56   Ht '5\' 10"'$  (1.778 m)   Wt 159 lb 12.8 oz (72.5 kg)   SpO2 98%   BMI 22.93 kg/m     Wt Readings from Last 3 Encounters:  04/11/22 159 lb 12.8 oz (72.5 kg)  04/04/22 161 lb (73 kg)  03/15/22 158 lb 12.8 oz (72 kg)     GEN:  Well nourished, well developed in no acute distress HEENT: Normal NECK: No JVD; No carotid bruits CARDIAC: RRR, 1/6 systolic murmur, no rubs or gallops RESPIRATORY:  Clear to auscultation without rales, wheezing or rhonchi  ABDOMEN: Soft, non-tender, non-distended MUSCULOSKELETAL:  No edema; No deformity  SKIN: Warm and dry NEUROLOGIC:  Alert and oriented x 3 PSYCHIATRIC:  Normal affect   ASSESSMENT:    1. Atrial tachycardia   2. Bilateral carotid artery stenosis   3. Dilation of aorta (HCC)   4. Hyperlipidemia, unspecified hyperlipidemia type   5. Coronary artery disease involving native coronary artery of native heart without angina pectoris   6. Lightheadedness    PLAN:    In order of problems listed above:  #Atrial Arrhythmias: #Lightheadedness with Activity: Patient with episodes with lightheadedness/presyncope with activity. ETT with atrial tach with peak exertion as well as STD. Cath with mild disease. CMR with normal EF, borderling RV EF, mild AI and mild aortic dilation but no LGE to suggest infiltrative process. Was  seen by EP and preferred to continue with metop at this time and declined flec. -Continue metop 12.'5mg'$  BID -May consider flec in the future -Follow-up with EP as scheduled  #Non-obstructive CAD: LHC 10/01/21 with mild nonobstructive disease. 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 -Continue medical management with ASA and statin -Repeat for monitoring   #Mild MR: #Mild AI: -Serial monitoring     Follow-up: 6 months.  Shared Decision Making/Informed Consent   Medication Adjustments/Labs and Tests Ordered: Current medicines are reviewed at length with the patient today.  Concerns regarding medicines are outlined above.  Orders Placed This Encounter  Procedures   ECHOCARDIOGRAM COMPLETE   VAS US CAROTID   No orders of the defined types were placed in this encounter.   Patient Instructions  Medication Instructions:   STOP TAKING ISOSORBIDE MONONITRATE (IMDUR) NOW  *If you need a refill on your cardiac medications before your next appointment, please call your pharmacy*   Testing/Procedures:  Your physician has requested that you have an echocardiogram. Echocardiography is a painless test that uses sound waves to create images of your heart. It provides your doctor with information about the size and shape of your heart and how well your heart's chambers and valves are working. This procedure takes approximately one hour. There are no restrictions for this procedure.  SCHEDULE ECHO TO BE DONE IN MAY 2024 PER DR. Johney Frame   Please do NOT wear cologne, perfume, aftershave, or lotions (deodorant is allowed). Please arrive 15 minutes prior to your appointment time.    Your physician has requested that you have a carotid duplex. This test is an ultrasound of the carotid arteries in your neck. It looks at blood flow through these arteries that supply the brain with blood. Allow one hour for this exam. There are no restrictions or special instructions.  SCHEDULE THIS TO BE DONE NOW    Follow-Up: At [facility], you and your health needs are our priority.  As part of our continuing mission to provide you with exceptional heart care, we have created designated Provider Care Teams.  These Care Teams include your primary Cardiologist (physician) and Advanced Practice Providers (APPs -  Physician Assistants and Nurse Practitioners) who all work together to provide you with the care you need, when you need it.  We recommend signing up for the patient portal called "MyChart".  Sign up information is provided on this After Visit Summary.  MyChart is used to connect with patients for Virtual Visits (Telemedicine).  Patients are able to view lab/test results, encounter notes, upcoming appointments, etc.  Non-urgent messages can be sent to your provider as well.   To learn more about what you can do with MyChart, go to [email or web address].    Your next appointment:   6 month(s)  The format for your next appointment:   In Person  Provider:   Robbie Lis, PA-C, Nicholes Rough, PA-C, Melina Copa, PA-C, Ambrose Pancoast, NP, Cecilie Kicks, NP, Ermalinda Barrios, PA-C, Christen Bame, NP, or Richardson Dopp, PA-C  I,Breanna Adamick,acting as a scribe for Freada Bergeron, MD.,have documented all relevant documentation on the behalf of Freada Bergeron, MD,as directed by  Freada Bergeron, MD while in the presence of Freada Bergeron, MD.  I, Freada Bergeron, MD, have reviewed all documentation for this visit. The documentation on 04/11/22 for the exam, diagnosis, procedures, and orders are all accurate and complete.   Signed, Freada Bergeron, MD  04/11/2022 12:58 PM    [place]

## 2022-04-11 NOTE — Patient Instructions (Signed)
Medication Instructions:   STOP TAKING ISOSORBIDE MONONITRATE (IMDUR) NOW  *If you need a refill on your cardiac medications before your next appointment, please call your pharmacy*   Testing/Procedures:  Your physician has requested that you have an echocardiogram. Echocardiography is a painless test that uses sound waves to create images of your heart. It provides your doctor with information about the size and shape of your heart and how well your heart's chambers and valves are working. This procedure takes approximately one hour. There are no restrictions for this procedure.  SCHEDULE ECHO TO BE DONE IN MAY 2024 PER DR. Johney Frame   Please do NOT wear cologne, perfume, aftershave, or lotions (deodorant is allowed). Please arrive 15 minutes prior to your appointment time.    Your physician has requested that you have a carotid duplex. This test is an ultrasound of the carotid arteries in your neck. It looks at blood flow through these arteries that supply the brain with blood. Allow one hour for this exam. There are no restrictions or special instructions.  SCHEDULE THIS TO BE DONE NOW    Follow-Up: At [facility], you and your health needs are our priority.  As part of our continuing mission to provide you with exceptional heart care, we have created designated Provider Care Teams.  These Care Teams include your primary Cardiologist (physician) and Advanced Practice Providers (APPs -  Physician Assistants and Nurse Practitioners) who all work together to provide you with the care you need, when you need it.  We recommend signing up for the patient portal called "MyChart".  Sign up information is provided on this After Visit Summary.  MyChart is used to connect with patients for Virtual Visits (Telemedicine).  Patients are able to view lab/test results, encounter notes, upcoming appointments, etc.  Non-urgent messages can be sent to your provider as well.   To learn more about  what you can do with MyChart, go to [email or web address].    Your next appointment:   6 month(s)  The format for your next appointment:   In Person  Provider:   Robbie Lis, PA-C, Nicholes Rough, PA-C, Melina Copa, PA-C, Ambrose Pancoast, NP, Cecilie Kicks, NP, Ermalinda Barrios, PA-C, Christen Bame, NP, or Richardson Dopp, PA-C

## 2022-04-12 ENCOUNTER — Ambulatory Visit: Payer: Medicare Other | Admitting: Family Medicine

## 2022-04-15 ENCOUNTER — Ambulatory Visit (HOSPITAL_COMMUNITY)
Admission: RE | Admit: 2022-04-15 | Discharge: 2022-04-15 | Disposition: A | Discharge status: Home / Self Care | Payer: Medicare Other | Source: Ambulatory Visit | Attending: Cardiology | Admitting: Cardiology

## 2022-04-15 DIAGNOSIS — I6523 Occlusion and stenosis of bilateral carotid arteries: Secondary | ICD-10-CM | POA: Diagnosis not present

## 2022-04-17 NOTE — Assessment & Plan Note (Signed)
Encouraged good sleep hygiene such as dark, quiet room. No blue/green glowing lights such as computer screens in bedroom. No alcohol or stimulants in evening. Cut down on caffeine as able. Regular exercise is helpful but not just prior to bed time.  Ambien prn 

## 2022-04-17 NOTE — Assessment & Plan Note (Signed)
Supplement and monitor 

## 2022-04-17 NOTE — Assessment & Plan Note (Addendum)
Well controlled, no changes to meds. Encouraged heart healthy diet such as the DASH diet and exercise as tolerated.  RSV (respiratory syncitial virus) vaccine at pharmacy, Arexvy Covid booster new version at pharmacy High dose flu shot  Tetanus is due

## 2022-04-17 NOTE — Assessment & Plan Note (Signed)
ON Remeron and Lorazepam prn

## 2022-04-17 NOTE — Assessment & Plan Note (Signed)
Encourage heart healthy diet such as MIND or DASH diet, increase exercise, avoid trans fats, simple carbohydrates and processed foods, consider a krill or fish or flaxseed oil cap daily. Tolerating Atorvastatin 

## 2022-04-17 NOTE — Progress Notes (Unsigned)
Subjective:    Patient ID: James [name], male    DOB: 12-27-1940, 81 y.o.   MRN: [id number]  No chief complaint on file.   HPI Patient is in today for ***  Past Medical History:  Diagnosis Date  . AAA (abdominal aortic aneurysm) without rupture (HCC) 09/08/2013  . Abdominal bloating 05/19/2016  . Anxiety state 05/18/2014  . Atrial fibrillation (HCC)   . 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 Benign paroxysmal positional vertigo 04/06/2013  . CAD (coronary artery disease)    nonobstructive by cath 2008; nonobstructive by Cardiac CT 11/2011  . Cervical spine fracture (HCC) 1980   C6-7  . Diverticulosis   . Erectile dysfunction 09/08/2013  . GERD (gastroesophageal reflux disease)   . Hiatal hernia   . History of atrial fibrillation   . History of squamous cell carcinoma 10-05-10   Dr Cristopher Estimable, [place]  . HLD (hyperlipidemia)   . Hypothyroidism   . Medicare annual wellness visit, subsequent 03/16/2014   Sees [facility] Follows with Dr Eden Emms of cardiology [facility] Sees GI for colonoscopy, last colonoscopy in 2013   . Mitral valve prolapse   . Neck pain 05/03/2015  . Preventative health care 03/16/2014  . 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 Syncope 02/03/2013  . 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    Past Surgical History:  Procedure Laterality Date  . APPENDECTOMY  05/2011  . CARDIAC CATHETERIZATION  01/03/12   30% ostial LAD stenosis, ostial 30-40% D1 stenosis, smooth and normal left main and RCA; LVEF 65%  . CHOLECYSTECTOMY N/A 08/25/2014   Procedure: LAPAROSCOPIC CHOLECYSTECTOMY WITH INTRAOPERATIVE CHOLANGIOGRAM;  Surgeon: Avel Peace, MD;  Location: [facility] OR;  Service: General;  Laterality: N/A;  . INGUINAL HERNIA REPAIR  07/2010   right  . KNEE ARTHROPLASTY     bilat  . LEFT HEART CATH AND CORONARY ANGIOGRAPHY N/A 10/01/2021   Procedure: LEFT HEART CATH AND CORONARY ANGIOGRAPHY;  Surgeon: Lyn Records, MD;  Location: [facility]  INVASIVE CV LAB;  Service: Cardiovascular;  Laterality: N/A;  . LEFT HEART CATHETERIZATION WITH CORONARY ANGIOGRAM N/A 01/03/2012   Procedure: LEFT HEART CATHETERIZATION WITH CORONARY ANGIOGRAM;  Surgeon: Vesta Mixer, MD;  Location[facility] CATH LAB;  Service: Cardiovascular;  Laterality: N/A;  . MRI  10/31/2016  . TONSILLECTOMY  1947  . tumor removal  1958   "fatty tumor"    Family History  Problem Relation Age of Onset  . Heart disease Mother        MI 69s  . Stroke Mother   . Hypertension Mother   . Heart disease Maternal Grandfather        MI 70-80s  . Heart disease Maternal Grandmother        MI 70-80s  . Lung disease Father        penumonitis  . Cancer Sister        colon  . Cancer Paternal Uncle        GI: colon he thinks    Social History   Socioeconomic History  . Marital status: Single    Spouse name: Not on file  . Number of children: Not on file  . Years of education: Not on file  . Highest education level: Not on file  Occupational History  . Occupation: Art gallery manager    Comment: Retired  Tobacco Use  . Smoking status: Never  . Smokeless tobacco: Never  Vaping Use  . Vaping Use: Never  used  Substance and Sexual Activity  . Alcohol use: Yes    Comment: 2 glasses red wine/day  . Drug use: No  . Sexual activity: Yes  Other Topics Concern  . Not on file  Social History Narrative  . Not on file   Social Determinants of Health   Financial Resource Strain: Low Risk  (09/23/2021)   Overall Financial Resource Strain (CARDIA)   . Difficulty of Paying Living Expenses: Not hard at all  Food Insecurity: No Food Insecurity (09/23/2021)   Hunger Vital Sign   . Worried About Programme researcher, broadcasting/film/video in the Last Year: Never true   . Ran Out of Food in the Last Year: Never true  Transportation Needs: No Transportation Needs (09/23/2021)   PRAPARE - Transportation   . Lack of Transportation (Medical): No   . Lack of Transportation (Non-Medical): No  Physical Activity:  Sufficiently Active (01/26/2022)   Exercise Vital Sign   . Days of Exercise per Week: 4 days   . Minutes of Exercise per Session: 60 min  Stress: No Stress Concern Present (09/23/2021)   [employer] of Occupational Health - Occupational Stress Questionnaire   . Feeling of Stress : Not at all  Social Connections: Moderately Integrated (01/26/2022)   Social Connection and Isolation Panel [NHANES]   . Frequency of Communication with Friends and Family: Three times a week   . Frequency of Social Gatherings with Friends and Family: Three times a week   . Attends Religious Services: More than 4 times per year   . Active Member of Clubs or Organizations: Yes   . Attends Banker Meetings: 1 to 4 times per year   . Marital Status: Never married  Intimate Partner Violence: Not At Risk (07/09/2020)   Humiliation, Afraid, Rape, and Kick questionnaire   . Fear of Current or Ex-Partner: No   . Emotionally Abused: No   . Physically Abused: No   . Sexually Abused: No    Outpatient Medications Prior to Visit  Medication Sig Dispense Refill  . ASHWAGANDHA PO Take 1 tablet by mouth daily.    Marland Kitchen atorvastatin (LIPITOR) 20 MG tablet Take 1 tablet (20 mg total) by mouth daily. 90 tablet 3  . Calcium Carb-Cholecalciferol (CALCIUM 1000 + D PO) Take 1 tablet by mouth 3 (three) times a week.     . COLLAGEN PO Take 1 capsule by mouth daily. (Chewable - has hyaluronic acid, collagen and biotin)    . Cyanocobalamin (VITAMIN B 12 PO) Take 3,000 mcg by mouth daily.    . diclofenac Sodium (VOLTAREN) 1 % GEL Apply 4 g topically 4 (four) times daily. To affected joint. 100 g 11  . Dutasteride-Tamsulosin HCl 0.5-0.4 MG CAPS TAKE 1 CAPSULE BY MOUTH AT BEDTIME 90 capsule 1  . ELDERBERRY PO Take 50 mg by mouth 3 (three) times a week.    . levothyroxine (SYNTHROID) 100 MCG tablet TAKE 1 TABLET BY MOUTH EVERY MORNING BEFORE BREAKFAST 90 tablet 1  . LORazepam (ATIVAN) 0.5 MG tablet Take 1 tablet (0.5 mg total) by  mouth 2 (two) times daily as needed. for anxiety 30 tablet 1  . Melatonin 10 MG TABS Take 10 mg by mouth at bedtime.    . metoprolol succinate (TOPROL XL) 25 MG 24 hr tablet Take 0.5 tablets (12.5 mg total) by mouth at bedtime. 90 tablet 3  . Milk Thistle 1000 MG CAPS Take 1,000 mg by mouth in the morning.    . mirtazapine (REMERON)  15 MG tablet Take 1 tablet (15 mg total) by mouth at bedtime. 90 tablet 0  . Multiple Vitamins-Minerals (MULTIVITAMINS THER. W/MINERALS) TABS Take 1 tablet by mouth daily.    . pantoprazole (PROTONIX) 40 MG tablet TAKE 1 TABLET BY MOUTH 2 TIMES A DAY BEFORE MEALS FOR ACID REFLUX (Patient taking differently: 40 mg daily.) 180 tablet 3  . Probiotic Product (PROBIOTIC DAILY PO) Take 1 tablet by mouth daily.    . Testosterone 30 MG/ACT SOLN APPLY 1 PUMP TOPICALLY DAILY. FIVE DAYS A WEEK (Patient taking differently: Apply 1 Pump topically daily. Under each arm Five days a week) 180 mL 0  . Turmeric (QC TUMERIC COMPLEX) 500 MG CAPS Take 1 tablet by mouth daily.    Marland Kitchen zolpidem (AMBIEN) 10 MG tablet TAKE 1 TABLET BY MOUTH AT BEDTIME AS NEEDED FOR SLEEP 30 tablet 5   No facility-administered medications prior to visit.    Allergies  Allergen Reactions  . Sulfa Antibiotics Other (See Comments)    Childhood allergy    Review of Systems  Constitutional:  Negative for fever and malaise/fatigue.  HENT:  Negative for congestion.   Eyes:  Negative for blurred vision.  Respiratory:  Negative for shortness of breath.   Cardiovascular:  Negative for chest pain, palpitations and leg swelling.  Gastrointestinal:  Negative for abdominal pain, blood in stool and nausea.  Genitourinary:  Negative for dysuria and frequency.  Musculoskeletal:  Negative for falls.  Skin:  Negative for rash.  Neurological:  Negative for dizziness, loss of consciousness and headaches.  Endo/Heme/Allergies:  Negative for environmental allergies.  Psychiatric/Behavioral:  Negative for depression. The  patient is not nervous/anxious.       Objective:    Physical Exam Constitutional:      General: He is not in acute distress.    Appearance: Normal appearance. He is not ill-appearing or toxic-appearing.  HENT:     Head: Normocephalic and atraumatic.     Right Ear: External ear normal.     Left Ear: External ear normal.     Nose: Nose normal.  Eyes:     General:        Right eye: No discharge.        Left eye: No discharge.  Cardiovascular:     Rate and Rhythm: Normal rate and regular rhythm.     Heart sounds: No murmur heard. Pulmonary:     Effort: Pulmonary effort is normal.     Breath sounds: Normal breath sounds.  Abdominal:     General: Bowel sounds are normal.     Tenderness: There is no abdominal tenderness.  Musculoskeletal:     Cervical back: Neck supple.  Skin:    Findings: No rash.  Neurological:     Mental Status: He is alert and oriented to person, place, and time.  Psychiatric:        Behavior: Behavior normal.   There were no vitals taken for this visit. Wt Readings from Last 3 Encounters:  04/11/22 159 lb 12.8 oz (72.5 kg)  04/04/22 161 lb (73 kg)  03/15/22 158 lb 12.8 oz (72 kg)    Diabetic Foot Exam - Simple   No data filed    Lab Results  Component Value Date   WBC 6.0 09/29/2021   HGB 14.8 09/29/2021   HCT 42.9 09/29/2021   PLT 207 09/29/2021   GLUCOSE 117 (H) 09/29/2021   CHOL 125 07/15/2021   TRIG 71.0 07/15/2021   HDL 57.80 07/15/2021  LDLCALC 53 07/15/2021   ALT 14 07/15/2021   AST 21 07/15/2021   NA 136 09/29/2021   K 4.4 09/29/2021   CL 98 09/29/2021   CREATININE 0.87 09/29/2021   BUN 12 09/29/2021   CO2 30 (H) 09/29/2021   TSH 2.97 07/15/2021   PSA 0.40 09/06/2021   INR 1.09 08/21/2014    Lab Results  Component Value Date   TSH 2.97 07/15/2021   Lab Results  Component Value Date   WBC 6.0 09/29/2021   HGB 14.8 09/29/2021   HCT 42.9 09/29/2021   MCV 93 09/29/2021   PLT 207 09/29/2021   Lab Results  Component  Value Date   NA 136 09/29/2021   K 4.4 09/29/2021   CO2 30 (H) 09/29/2021   GLUCOSE 117 (H) 09/29/2021   BUN 12 09/29/2021   CREATININE 0.87 09/29/2021   BILITOT 1.1 07/15/2021   ALKPHOS 54 07/15/2021   AST 21 07/15/2021   ALT 14 07/15/2021   PROT 6.6 07/15/2021   ALBUMIN 3.9 07/15/2021   CALCIUM 9.7 09/29/2021   ANIONGAP 11 03/07/2020   EGFR 87 09/29/2021   GFR 75.97 07/15/2021   Lab Results  Component Value Date   CHOL 125 07/15/2021   Lab Results  Component Value Date   HDL 57.80 07/15/2021   Lab Results  Component Value Date   LDLCALC 53 07/15/2021   Lab Results  Component Value Date   TRIG 71.0 07/15/2021   Lab Results  Component Value Date   CHOLHDL 2 07/15/2021   No results found for: "HGBA1C"     Assessment & Plan:   Problem List Items Addressed This Visit     Hyperlipidemia    Encourage heart healthy diet such as MIND or DASH diet, increase exercise, avoid trans fats, simple carbohydrates and processed foods, consider a krill or fish or flaxseed oil cap daily. Tolerating Atorvastatin      Low testosterone    Supplement and monitor      Insomnia    Encouraged good sleep hygiene such as dark, quiet room. No blue/green glowing lights such as computer screens in bedroom. No alcohol or stimulants in evening. Cut down on caffeine as able. Regular exercise is helpful but not just prior to bed time. Ambien prn      Anxiety state    ON Remeron and Lorazepam prn      Essential hypertension    Well controlled, no changes to meds. Encouraged heart healthy diet such as the DASH diet and exercise as tolerated.  RSV (respiratory syncitial virus) vaccine at pharmacy, Arexvy Covid booster new version at pharmacy High dose flu shot  Tetanus is due      Renal insufficiency    Supplement and monitor       I am having Rennis Harding E. Rusk maintain his multivitamins ther. w/minerals, Calcium Carb-Cholecalciferol (CALCIUM 1000 + D PO), Probiotic Product  (PROBIOTIC DAILY PO), Milk Thistle, ELDERBERRY PO, Cyanocobalamin (VITAMIN B 12 PO), Melatonin, diclofenac Sodium, atorvastatin, pantoprazole, ASHWAGANDHA PO, metoprolol succinate, zolpidem, Dutasteride-Tamsulosin HCl, levothyroxine, Turmeric, COLLAGEN PO, LORazepam, Testosterone, and mirtazapine.  No orders of the defined types were placed in this encounter.    Danise Edge, MD

## 2022-04-18 ENCOUNTER — Ambulatory Visit (INDEPENDENT_AMBULATORY_CARE_PROVIDER_SITE_OTHER): Payer: Medicare Other | Admitting: Family Medicine

## 2022-04-18 VITALS — BP 122/74 | HR 50 | Temp 98.0°F | Resp 16 | Ht 70.0 in | Wt 160.0 lb

## 2022-04-18 DIAGNOSIS — F411 Generalized anxiety disorder: Secondary | ICD-10-CM

## 2022-04-18 DIAGNOSIS — R7989 Other specified abnormal findings of blood chemistry: Secondary | ICD-10-CM

## 2022-04-18 DIAGNOSIS — Z23 Encounter for immunization: Secondary | ICD-10-CM | POA: Diagnosis not present

## 2022-04-18 DIAGNOSIS — E559 Vitamin D deficiency, unspecified: Secondary | ICD-10-CM | POA: Diagnosis not present

## 2022-04-18 DIAGNOSIS — G4700 Insomnia, unspecified: Secondary | ICD-10-CM | POA: Diagnosis not present

## 2022-04-18 DIAGNOSIS — E785 Hyperlipidemia, unspecified: Secondary | ICD-10-CM | POA: Diagnosis not present

## 2022-04-18 DIAGNOSIS — E039 Hypothyroidism, unspecified: Secondary | ICD-10-CM

## 2022-04-18 DIAGNOSIS — I10 Essential (primary) hypertension: Secondary | ICD-10-CM

## 2022-04-18 DIAGNOSIS — R739 Hyperglycemia, unspecified: Secondary | ICD-10-CM | POA: Diagnosis not present

## 2022-04-18 DIAGNOSIS — I6523 Occlusion and stenosis of bilateral carotid arteries: Secondary | ICD-10-CM | POA: Diagnosis not present

## 2022-04-18 DIAGNOSIS — N289 Disorder of kidney and ureter, unspecified: Secondary | ICD-10-CM

## 2022-04-18 NOTE — Assessment & Plan Note (Signed)
On Levothyroxine, continue to monitor 

## 2022-04-18 NOTE — Progress Notes (Signed)
Subjective:   By signing my name below, I, Shehryar Baig, attest that this documentation has been prepared under the direction and in the presence of [name], MD 04/18/2022     Patient ID: James [name], male    DOB: 01-21-1941, 81 y.o.   MRN: [id number]  Chief Complaint  Patient presents with   Follow-up    Here for follow up    HPI Patient is in today for a follow up visit.   He is interested in receiving the flu vaccine. He is not interested in receiving the new Covid-19 booster vaccine.  He continues exercising regularly. He attended the senior Olympics and placed second. His blood sugar was elevated during his last blood work. He has no family medical history of diabetes.  He was taken off Imdur from his cardiologist and he denies any new chest pain or new SOB symptoms.  BP Readings from Last 3 Encounters:  04/18/22 122/74  04/11/22 98/72  04/04/22 124/70   Pulse Readings from Last 3 Encounters:  04/18/22 (!) 50  04/11/22 (!) 56  04/04/22 (!) 51   He does not have his health care power of attorney set up.   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/2014   Benign paroxysmal positional vertigo 04/06/2013   CAD (coronary artery disease)    nonobstructive by cath 2008; nonobstructive by Cardiac CT 11/2011   Cervical spine fracture ([place]) 1980   C6-7   Diverticulosis    Erectile dysfunction 09/08/2013   GERD (gastroesophageal reflux disease)    Hiatal hernia    History of atrial fibrillation    History of squamous cell carcinoma 10-05-10   Dr [name], [place] (hyperlipidemia)    Hypothyroidism    Medicare annual wellness visit, subsequent 03/16/2014   Sees [facility] Follows with Dr Johnsie Cancel of cardiology Sees [facility] for colonoscopy, last  colonoscopy in 2013    Mitral valve prolapse    Neck pain 05/03/2015   Preventative health care 03/16/2014   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   s/p excision   Syncope 02/03/2013   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    Past Surgical History:  Procedure Laterality Date   APPENDECTOMY  05/2011   CARDIAC CATHETERIZATION  01/03/12   30% ostial LAD stenosis, ostial 30-40% D1 stenosis, smooth and normal left main and RCA; LVEF 65%   CHOLECYSTECTOMY N/A 08/25/2014   Procedure: LAPAROSCOPIC CHOLECYSTECTOMY WITH INTRAOPERATIVE CHOLANGIOGRAM;  Surgeon: [name], MD;  Location: [facility];  Service: General;  Laterality: N/A;   INGUINAL HERNIA REPAIR  07/2010   right   KNEE ARTHROPLASTY     bilat   LEFT HEART CATH AND CORONARY ANGIOGRAPHY N/A 10/01/2021   Procedure: LEFT HEART CATH AND CORONARY ANGIOGRAPHY;  Surgeon: Belva Crome, MD;  Location: [facility];  Service: Cardiovascular;  Laterality: N/A;   LEFT HEART CATHETERIZATION WITH CORONARY ANGIOGRAM N/A 01/03/2012   Procedure: LEFT HEART CATHETERIZATION WITH CORONARY ANGIOGRAM;  Surgeon: [name], MD;  Location: [facility] CATH [facility];  Service: Cardiovascular;  Laterality: N/A;   MRI  10/31/2016   TONSILLECTOMY  1947   tumor removal  1958   "fatty tumor"    Family History  Problem Relation Age of Onset   Heart disease Mother  MI 31s   Stroke Mother    Hypertension Mother    Heart disease Maternal Grandfather        MI 61-80s   Heart disease Maternal Grandmother        MI 88-80s   Lung disease Father        penumonitis   Cancer Sister        colon   Cancer Paternal Uncle        GI: colon he thinks    Social History   Socioeconomic History   Marital status: Single    Spouse name: Not on file   Number of children: Not on file   Years of education: Not on file   Highest education level: Not on file  Occupational History   Occupation: Chief Financial Officer    Comment: Retired  Tobacco Use   Smoking status: Never    Smokeless tobacco: Never  Vaping Use   Vaping Use: Never used  Substance and Sexual Activity   Alcohol use: Yes    Comment: 2 glasses red wine/day   Drug use: No   Sexual activity: Yes  Other Topics Concern   Not on file  Social History Narrative   Not on file   Social Determinants of Health   Financial Resource Strain: Low Risk  (09/23/2021)   Overall Financial Resource Strain (CARDIA)    Difficulty of Paying Living Expenses: Not hard at all  Food Insecurity: No Food Insecurity (09/23/2021)   Hunger Vital Sign    Worried About Running Out of Food in the Last Year: Never true    [place] in the Last Year: Never true  Transportation Needs: No Transportation Needs (09/23/2021)   PRAPARE - Hydrologist (Medical): No    Lack of Transportation (Non-Medical): No  Physical Activity: Sufficiently Active (01/26/2022)   Exercise Vital Sign    Days of Exercise per Week: 4 days    Minutes of Exercise per Session: 60 min  Stress: No Stress Concern Present (09/23/2021)   [place]    Feeling of Stress : Not at all  Social Connections: Moderately Integrated (01/26/2022)   Social Connection and Isolation Panel [NHANES]    Frequency of Communication with Friends and Family: Three times a week    Frequency of Social Gatherings with Friends and Family: Three times a week    Attends Religious Services: More than 4 times per year    Active Member of Clubs or Organizations: Yes    Attends Archivist Meetings: 1 to 4 times per year    Marital Status: Never married  Intimate Partner Violence: Not At Risk (07/09/2020)   Humiliation, Afraid, Rape, and Kick questionnaire    Fear of Current or Ex-Partner: No    Emotionally Abused: No    Physically Abused: No    Sexually Abused: No    Outpatient Medications Prior to Visit  Medication Sig Dispense Refill   ASHWAGANDHA PO Take 1 tablet by mouth  daily.     atorvastatin (LIPITOR) 20 MG tablet Take 1 tablet (20 mg total) by mouth daily. 90 tablet 3   Calcium Carb-Cholecalciferol (CALCIUM 1000 + D PO) Take 1 tablet by mouth 3 (three) times a week.      COLLAGEN PO Take 1 capsule by mouth daily. (Chewable - has hyaluronic acid, collagen and biotin)     Cyanocobalamin (VITAMIN B 12 PO) Take 3,000 mcg by mouth daily.  diclofenac Sodium (VOLTAREN) 1 % GEL Apply 4 g topically 4 (four) times daily. To affected joint. 100 g 11   Dutasteride-Tamsulosin HCl 0.5-0.4 MG CAPS TAKE 1 CAPSULE BY MOUTH AT BEDTIME 90 capsule 1   ELDERBERRY PO Take 50 mg by mouth 3 (three) times a week.     levothyroxine (SYNTHROID) 100 MCG tablet TAKE 1 TABLET BY MOUTH EVERY MORNING BEFORE BREAKFAST 90 tablet 1   LORazepam (ATIVAN) 0.5 MG tablet Take 1 tablet (0.5 mg total) by mouth 2 (two) times daily as needed. for anxiety 30 tablet 1   Melatonin 10 MG TABS Take 10 mg by mouth at bedtime.     metoprolol succinate (TOPROL XL) 25 MG 24 hr tablet Take 0.5 tablets (12.5 mg total) by mouth at bedtime. 90 tablet 3   Milk Thistle 1000 MG CAPS Take 1,000 mg by mouth in the morning.     mirtazapine (REMERON) 15 MG tablet Take 1 tablet (15 mg total) by mouth at bedtime. 90 tablet 0   Multiple Vitamins-Minerals (MULTIVITAMINS THER. W/MINERALS) TABS Take 1 tablet by mouth daily.     pantoprazole (PROTONIX) 40 MG tablet TAKE 1 TABLET BY MOUTH 2 TIMES A DAY BEFORE MEALS FOR ACID REFLUX (Patient taking differently: 40 mg daily.) 180 tablet 3   Probiotic Product (PROBIOTIC DAILY PO) Take 1 tablet by mouth daily.     Testosterone 30 MG/ACT SOLN APPLY 1 PUMP TOPICALLY DAILY. FIVE DAYS A WEEK (Patient taking differently: Apply 1 Pump topically daily. Under each arm Five days a week) 180 mL 0   Turmeric (QC TUMERIC COMPLEX) 500 MG CAPS Take 1 tablet by mouth daily.     zolpidem (AMBIEN) 10 MG tablet TAKE 1 TABLET BY MOUTH AT BEDTIME AS NEEDED FOR SLEEP 30 tablet 5   No  facility-administered medications prior to visit.    Allergies  Allergen Reactions   Sulfa Antibiotics Other (See Comments)    Childhood allergy    Review of Systems  Constitutional:  Negative for fever and malaise/fatigue.  HENT:  Negative for congestion.   Eyes:  Negative for blurred vision.  Respiratory:  Negative for shortness of breath.   Cardiovascular:  Negative for chest pain, palpitations and leg swelling.  Gastrointestinal:  Negative for abdominal pain, blood in stool and nausea.  Genitourinary:  Negative for dysuria and frequency.  Musculoskeletal:  Negative for falls.  Skin:  Negative for rash.  Neurological:  Negative for dizziness, loss of consciousness and headaches.  Endo/Heme/Allergies:  Negative for environmental allergies.  Psychiatric/Behavioral:  Negative for depression. The patient is not nervous/anxious.        Objective:    Physical Exam Constitutional:      General: He is not in acute distress.    Appearance: Normal appearance. He is not ill-appearing or toxic-appearing.  HENT:     Head: Normocephalic and atraumatic.     Right Ear: External ear normal.     Left Ear: External ear normal.     Nose: Nose normal.  Eyes:     General:        Right eye: No discharge.        Left eye: No discharge.  Cardiovascular:     Rate and Rhythm: Normal rate and regular rhythm.     Heart sounds: No murmur heard. Pulmonary:     Effort: Pulmonary effort is normal.     Breath sounds: Normal breath sounds.  Abdominal:     General: Bowel sounds are normal.  Tenderness: There is no abdominal tenderness.  Musculoskeletal:     Cervical back: Neck supple.  Skin:    Findings: No rash.  Neurological:     Mental Status: He is alert and oriented to person, place, and time.  Psychiatric:        Behavior: Behavior normal.    BP 122/74 (BP Location: Right Arm, Patient Position: Sitting, Cuff Size: Normal)   Pulse (!) 50   Temp 98 F (36.7 C) (Oral)   Resp 16    Ht _0  (1.778 m)   Wt 160 lb (72.6 kg)   SpO2 96%   BMI 22.96 kg/m  Wt Readings from Last 3 Encounters:  04/18/22 160 lb (72.6 kg)  04/11/22 159 lb 12.8 oz (72.5 kg)  04/04/22 161 lb (73 kg)    Diabetic Foot Exam - Simple   No data filed    Lab Results  Component Value Date   WBC 6.0 09/29/2021   HGB 14.8 09/29/2021   HCT 42.9 09/29/2021   PLT 207 09/29/2021   GLUCOSE 117 (H) 09/29/2021   CHOL 125 07/15/2021   TRIG 71.0 07/15/2021   HDL 57.80 07/15/2021   LDLCALC 53 07/15/2021   ALT 14 07/15/2021   AST 21 07/15/2021   NA 136 09/29/2021   K 4.4 09/29/2021   CL 98 09/29/2021   CREATININE 0.87 09/29/2021   BUN 12 09/29/2021   CO2 30 (H) 09/29/2021   TSH 2.97 07/15/2021   PSA 0.40 09/06/2021   INR 1.09 08/21/2014    Lab Results  Component Value Date   TSH 2.97 07/15/2021   Lab Results  Component Value Date   WBC 6.0 09/29/2021   HGB 14.8 09/29/2021   HCT 42.9 09/29/2021   MCV 93 09/29/2021   PLT 207 09/29/2021   Lab Results  Component Value Date   NA 136 09/29/2021   K 4.4 09/29/2021   CO2 30 (H) 09/29/2021   GLUCOSE 117 (H) 09/29/2021   BUN 12 09/29/2021   CREATININE 0.87 09/29/2021   BILITOT 1.1 07/15/2021   ALKPHOS 54 07/15/2021   AST 21 07/15/2021   ALT 14 07/15/2021   PROT 6.6 07/15/2021   ALBUMIN 3.9 07/15/2021   CALCIUM 9.7 09/29/2021   ANIONGAP 11 03/07/2020   EGFR 87 09/29/2021   GFR 75.97 07/15/2021   Lab Results  Component Value Date   CHOL 125 07/15/2021   Lab Results  Component Value Date   HDL 57.80 07/15/2021   Lab Results  Component Value Date   LDLCALC 53 07/15/2021   Lab Results  Component Value Date   TRIG 71.0 07/15/2021   Lab Results  Component Value Date   CHOLHDL 2 07/15/2021   No results found for: "HGBA1C"     Assessment & Plan:   Problem List Items Addressed This Visit     Hyperlipidemia    Encourage heart healthy diet such as MIND or DASH diet, increase exercise, avoid trans fats, simple  carbohydrates and processed foods, consider a krill or fish or flaxseed oil cap daily. Tolerating Atorvastatin      Relevant Orders   Lipid panel   Hypothyroidism    On Levothyroxine, continue to monitor      Low testosterone    Supplement and monitor      Insomnia    Encouraged good sleep hygiene such as dark, quiet room. No blue/green glowing lights such as computer screens in bedroom. No alcohol or stimulants in evening. Cut down on caffeine as  able. Regular exercise is helpful but not just prior to bed time. Ambien prn      Anxiety state    ON Remeron and Lorazepam prn      Essential hypertension    Well controlled, no changes to meds. Encouraged heart healthy diet such as the DASH diet and exercise as tolerated.  RSV (respiratory syncitial virus) vaccine at pharmacy, Arexvy Covid booster new version at pharmacy High dose flu shot  Tetanus is due      Relevant Orders   CBC   Comprehensive metabolic panel   TSH   Renal insufficiency    Supplement and monitor      Hyperglycemia    hgba1c acceptable, minimize simple carbs. Increase exercise as tolerated.       Relevant Orders   Hemoglobin A1c   Other Visit Diagnoses     Need for influenza vaccination    -  Primary   Relevant Orders   Flu Vaccine QUAD High Dose(Fluad) (Completed)   Vitamin D deficiency       Relevant Orders   VITAMIN D 25 Hydroxy (Vit-D Deficiency, Fractures)       I am having Lissa Merlin E. Savidge maintain his multivitamins ther. w/minerals, Calcium Carb-Cholecalciferol (CALCIUM 1000 + D PO), Probiotic Product (PROBIOTIC DAILY PO), Milk Thistle, ELDERBERRY PO, Cyanocobalamin (VITAMIN B 12 PO), Melatonin, diclofenac Sodium, atorvastatin, pantoprazole, ASHWAGANDHA PO, metoprolol succinate, zolpidem, Dutasteride-Tamsulosin HCl, levothyroxine, Turmeric, COLLAGEN PO, LORazepam, Testosterone, and mirtazapine.  No orders of the defined types were placed in this encounter.  I, Penni Homans, MD,  personally preformed the services described in this documentation.  All medical record entries made by the scribe were at my direction and in my presence.  I have reviewed the chart and discharge instructions (if applicable) and agree that the record reflects my personal performance and is accurate and complete. 04/18/2022   I,Shehryar Baig,acting as a scribe for Penni Homans, MD.,have documented all relevant documentation on the behalf of Penni Homans, MD,as directed by  Penni Homans, MD while in the presence of Penni Homans, MD.  Penni Homans, MD

## 2022-04-18 NOTE — Assessment & Plan Note (Addendum)
hgba1c acceptable, minimize simple carbs. Increase exercise as tolerated.  

## 2022-04-18 NOTE — Patient Instructions (Addendum)
RSV (respiratory syncitial virus) vaccine at pharmacy, Arexvy take alone wait 2 weeks before any other shots. Covid booster new version at pharmacy High dose flu shot today

## 2022-04-19 LAB — CBC
HCT: 40 % (ref 39.0–52.0)
Hemoglobin: 13.7 g/dL (ref 13.0–17.0)
MCHC: 34.3 g/dL (ref 30.0–36.0)
MCV: 95 fl (ref 78.0–100.0)
Platelets: 212 10*3/uL (ref 150.0–400.0)
RBC: 4.21 Mil/uL — ABNORMAL LOW (ref 4.22–5.81)
RDW: 13.2 % (ref 11.5–15.5)
WBC: 6.5 10*3/uL (ref 4.0–10.5)

## 2022-04-19 LAB — COMPREHENSIVE METABOLIC PANEL
ALT: 20 U/L (ref 0–53)
AST: 22 U/L (ref 0–37)
Albumin: 4.1 g/dL (ref 3.5–5.2)
Alkaline Phosphatase: 53 U/L (ref 39–117)
BUN: 12 mg/dL (ref 6–23)
CO2: 29 mEq/L (ref 19–32)
Calcium: 9.3 mg/dL (ref 8.4–10.5)
Chloride: 98 mEq/L (ref 96–112)
Creatinine, Ser: 0.88 mg/dL (ref 0.40–1.50)
GFR: 80.16 mL/min (ref >60.00)
Glucose, Bld: 79 mg/dL (ref 70–99)
Potassium: 4.5 mEq/L (ref 3.5–5.1)
Sodium: 135 mEq/L (ref 135–145)
Total Bilirubin: 0.6 mg/dL (ref 0.2–1.2)
Total Protein: 6.9 g/dL (ref 6.0–8.3)

## 2022-04-19 LAB — LIPID PANEL
Cholesterol: 126 mg/dL (ref 0–200)
HDL: 62.6 mg/dL (ref >39.00)
LDL Cholesterol: 51 mg/dL (ref 0–99)
NonHDL: 63.22
Total CHOL/HDL Ratio: 2
Triglycerides: 61 mg/dL (ref 0.0–149.0)
VLDL: 12.2 mg/dL (ref 0.0–40.0)

## 2022-04-19 LAB — TSH: TSH: 1.88 u[IU]/mL (ref 0.35–5.50)

## 2022-04-19 LAB — VITAMIN D 25 HYDROXY (VIT D DEFICIENCY, FRACTURES): VITD: 46.21 ng/mL (ref 30.00–100.00)

## 2022-04-19 LAB — HEMOGLOBIN A1C: Hgb A1c MFr Bld: 5.5 % (ref 4.6–6.5)

## 2022-04-22 ENCOUNTER — Encounter: Payer: Self-pay | Admitting: Family Medicine

## 2022-04-27 ENCOUNTER — Other Ambulatory Visit: Payer: Self-pay | Admitting: Family Medicine

## 2022-05-09 ENCOUNTER — Other Ambulatory Visit: Payer: Self-pay | Admitting: Family Medicine

## 2022-05-09 NOTE — Telephone Encounter (Signed)
Requesting: Ambien '10mg'$   Contract: 09/03/21 UDS: 01/11/21 Last Visit: 04/18/22 Next Visit: None Last Refill: 11/19/21 #30 and 5rf  Please Advise

## 2022-05-20 ENCOUNTER — Encounter: Payer: Self-pay | Admitting: Nurse Practitioner

## 2022-05-20 ENCOUNTER — Ambulatory Visit (INDEPENDENT_AMBULATORY_CARE_PROVIDER_SITE_OTHER): Payer: Medicare Other | Admitting: Nurse Practitioner

## 2022-05-20 VITALS — BP 130/76 | HR 54 | Temp 98.0°F | Ht 70.0 in | Wt 157.6 lb

## 2022-05-20 DIAGNOSIS — J019 Acute sinusitis, unspecified: Secondary | ICD-10-CM | POA: Insufficient documentation

## 2022-05-20 MED ORDER — FLUTICASONE PROPIONATE 50 MCG/ACT NA SUSP: 2.0000 | Freq: Every day | NASAL | 6 refills | Status: AC

## 2022-05-20 MED ORDER — DOXYCYCLINE HYCLATE 100 MG PO TABS: 100.0000 mg | ORAL_TABLET | Freq: Two times a day (BID) | ORAL | 0 refills | Status: DC

## 2022-05-20 NOTE — Progress Notes (Signed)
Acute Office Visit  Subjective:     Patient ID: James [name], male    DOB: 1941/03/17, 81 y.o.   MRN: [id number]  Chief Complaint  Patient presents with   Acute Visit    Pt c/o congestion chest sinus congestion feeling sick off and on coughing x 1 month    HPI Patient is in today for malaise, cough, weakness for the last month.  He went to urgent care on 05/02/2022 and was given Tessalon Perles and Augmentin.  He tested negative for strep, flu, COVID.  He states that he is doing better,  however is not back to baseline.  He has been experiencing ear pressure, nasal congestion and sinus pressure.  He denies fevers, sore throat, shortness of breath, and chest pain.  He has tried liquid cough medication, halls cough drop, and sudafed.  He is normally very active and he has not been able to exercise in the last month.  ROS See pertinent positives and negatives per HPI.     Objective:    BP 130/76 (BP Location: Left Arm, Patient Position: Sitting, Cuff Size: Normal)   Pulse (!) 54   Temp 98 F (36.7 C) (Temporal)   Ht '5\' 10"'$  (1.778 m)   Wt 157 lb 9.6 oz (71.5 kg)   SpO2 98%   BMI 22.61 kg/m    Physical Exam Vitals and nursing note reviewed.  Constitutional:      Appearance: Normal appearance.  HENT:     Head: Normocephalic.     Right Ear: Tympanic membrane, ear canal and external ear normal.     Left Ear: Tympanic membrane, ear canal and external ear normal.     Nose:     Right Sinus: No maxillary sinus tenderness or frontal sinus tenderness.     Left Sinus: No maxillary sinus tenderness or frontal sinus tenderness.  Eyes:     Conjunctiva/sclera: Conjunctivae normal.  Cardiovascular:     Rate and Rhythm: Normal rate and regular rhythm.     Pulses: Normal pulses.     Heart sounds: Normal heart sounds.  Pulmonary:     Effort: Pulmonary effort is normal.     Breath sounds: Normal breath sounds.  Musculoskeletal:     Cervical back: Normal range of motion and neck  supple. No tenderness.  Lymphadenopathy:     Cervical: No cervical adenopathy.  Skin:    General: Skin is warm.  Neurological:     General: No focal deficit present.     Mental Status: He is alert and oriented to person, place, and time.  Psychiatric:        Mood and Affect: Mood normal.        Behavior: Behavior normal.        Thought Content: Thought content normal.        Judgment: Judgment normal.      Assessment & Plan:   Problem List Items Addressed This Visit       Respiratory   Acute non-recurrent sinusitis - Primary    He has been experiencing sinus congestion, cough, not feeling well for the last month.  He tested negative for strep, flu, COVID.  He went to an urgent care on 05/02/22 was given Augmentin and Tessalon Perles.  He states that he is doing slightly better, however overall he is still not feeling back to baseline.  He still has sinus pressure and cough.  Will treat him with a round of doxycycline 100 mg twice a day  for 10 days and he can start Flonase nasal spray daily.  He can continue the over-the-counter medications that he is taking.  Follow-up if symptoms worsen or do not improve.      Relevant Medications   doxycycline (VIBRA-TABS) 100 MG tablet   fluticasone (FLONASE) 50 MCG/ACT nasal spray    Meds ordered this encounter  Medications   doxycycline (VIBRA-TABS) 100 MG tablet    Sig: Take 1 tablet (100 mg total) by mouth 2 (two) times daily.    Dispense:  20 tablet    Refill:  0   fluticasone (FLONASE) 50 MCG/ACT nasal spray    Sig: Place 2 sprays into both nostrils daily.    Dispense:  16 g    Refill:  6    Return if symptoms worsen or fail to improve.  Charyl Dancer, NP

## 2022-05-20 NOTE — Patient Instructions (Signed)
It was great to see you!  Start doxycycline twice a day for 10 days. Start flonase (fluticasone) nasal spray 2 sprays in each nostril once a day. Keep taking the sudafed, cough syrup. Make sure you are drinking plenty of fluids.   Let's follow-up if your symptoms worsen or don't improve.   Take care,  Vance Peper, NP

## 2022-05-20 NOTE — Assessment & Plan Note (Signed)
He has been experiencing sinus congestion, cough, not feeling well for the last month.  He tested negative for strep, flu, COVID.  He went to an urgent care on 05/02/22 was given Augmentin and Tessalon Perles.  He states that he is doing slightly better, however overall he is still not feeling back to baseline.  He still has sinus pressure and cough.  Will treat him with a round of doxycycline 100 mg twice a day for 10 days and he can start Flonase nasal spray daily.  He can continue the over-the-counter medications that he is taking.  Follow-up if symptoms worsen or do not improve.

## 2022-06-06 ENCOUNTER — Other Ambulatory Visit: Payer: Self-pay | Admitting: Family Medicine

## 2022-06-07 ENCOUNTER — Ambulatory Visit (INDEPENDENT_AMBULATORY_CARE_PROVIDER_SITE_OTHER): Payer: Medicare Other | Admitting: Family Medicine

## 2022-06-07 ENCOUNTER — Encounter: Payer: Self-pay | Admitting: Family Medicine

## 2022-06-07 VITALS — BP 118/69 | HR 85 | Temp 98.4°F | Resp 18 | Ht 70.0 in | Wt 158.0 lb

## 2022-06-07 DIAGNOSIS — R051 Acute cough: Secondary | ICD-10-CM

## 2022-06-07 DIAGNOSIS — J101 Influenza due to other identified influenza virus with other respiratory manifestations: Secondary | ICD-10-CM

## 2022-06-07 LAB — POCT INFLUENZA A/B
Influenza A, POC: POSITIVE — AB
Influenza B, POC: NEGATIVE

## 2022-06-07 LAB — POC COVID19 BINAXNOW: SARS Coronavirus 2 Ag: NEGATIVE

## 2022-06-07 MED ORDER — BENZONATATE 200 MG PO CAPS: 200.0000 mg | ORAL_CAPSULE | Freq: Two times a day (BID) | ORAL | 0 refills | Status: DC | PRN

## 2022-06-07 MED ORDER — OSELTAMIVIR PHOSPHATE 75 MG PO CAPS: 75.0000 mg | ORAL_CAPSULE | Freq: Two times a day (BID) | ORAL | 0 refills | Status: DC

## 2022-06-07 MED ORDER — HYDROCOD POLI-CHLORPHE POLI ER 10-8 MG/5ML PO SUER: 5.0000 mL | Freq: Two times a day (BID) | ORAL | 0 refills | Status: AC | PRN

## 2022-06-07 MED ORDER — ONDANSETRON HCL 4 MG PO TABS: 4.0000 mg | ORAL_TABLET | Freq: Three times a day (TID) | ORAL | 0 refills | Status: DC | PRN

## 2022-06-07 MED ORDER — PREDNISONE 20 MG PO TABS: 40.0000 mg | ORAL_TABLET | Freq: Every day | ORAL | 0 refills | Status: AC

## 2022-06-07 NOTE — Patient Instructions (Signed)
Flu A positive Sending in Tamiflu, prednisone, cough medicine, and zofran for nausea Continue supportive measures including rest, hydration, humidifier use, steam showers, warm compresses to sinuses, warm liquids with lemon and honey, and over-the-counter cough, cold, and analgesics as needed.   Please contact office for follow-up if symptoms do not improve or worsen. Seek emergency care if symptoms become severe.   The following information is provided as a Human resources officer for ADULT patients only and does NOT take into account PREGNANCY, ALLERGIES, LIVER CONDITIONS, KIDNEY CONDITIONS, GASTROINTESTINAL CONDITIONS, OR PRESCRIPTION MEDICATION INTERACTIONS. Please be sure to ask your provider if the following are safe to take with your specific medical history, conditions, or current medication regimen if you are unsure.   Adult Basic Symptom Management   Congestion: Guaifenesin (Mucinex)- follow directions on packaging with a maximum dose of '2400mg'$  in a 24 hour period.  Pain/Fever: Ibuprofen '200mg'$  - '400mg'$  every 4-6 hours as needed (MAX '1200mg'$  in a 24 hour period) Pain/Fever: Tylenol '500mg'$  -'1000mg'$  every 6-8 hours as needed (MAX '3000mg'$  in a 24 hour period)  Cough: Dextromethorphan (Delsym)- follow directions on packing with a maximum dose of '120mg'$  in a 24 hour period.  Nasal Stuffiness: Saline nasal spray and/or Nettie Pot with sterile saline solution  Runny Nose: Fluticasone nasal spray (Flonase) OR Mometasone nasal spray (Nasonex) OR Triamcinolone Acetonide nasal spray (Nasacort)- follow directions on the packaging  Pain/Pressure: Warm washcloth to the face  Sore Throat: Warm salt water gargles  If you have allergies, you may also consider taking an oral antihistamine (like Zyrtec or Claritin) as these may also help with your symptoms.  **Many medications will have more than one ingredient, be sure you are reading the packaging carefully and not taking more than one dose of the same kind  of medication at the same time or too close together. It is OK to use formulas that have all of the ingredients you want, but do not take them in a combined medication and as separate dose too close together. If you have any questions, the pharmacist will be happy to help you decide what is safe.

## 2022-06-07 NOTE — Progress Notes (Signed)
Acute Office Visit  Subjective:     Patient ID: James [name], male    DOB: 09/04/1940, 81 y.o.   MRN: [id number]  Chief Complaint  Patient presents with   Nasal Congestion    Was seen at [facility] on 05/20/22- rx: doxy BID x 10 days and Flonase. Pt says he felt like the meds helped but sxs worsened once Rx completed. He had cough, fatigue, nausea, chills, loss of appetite.     HPI Patient is in today for URI symptoms.   Patient is reporting 3 to 4 days of cough (occasionally productive with clear sputum), sneezing, weakness, chills, body aches, decreased appetite, nausea, occasional wheezing, ear popping bilaterally, mild chest soreness from coughing.  Patient reports that back at the beginning of November he had gone to urgent care and was treated for sinusitis with Augmentin.  Symptoms persisted and on 05/20/2022 [place] went to [place] and was treated with doxycycline and Flonase.  Reports he felt good for 1 to 2 weeks until the past couple days when symptoms started again.  No known exposures to flu/COVID/RSV.  He denies any dyspnea, chest pain, sore throat, diarrhea, headaches, sinus pressure.  So far he has taken over-the-counter cough and cold medicines, Tylenol, Flonase.     ROS All review of systems negative except what is listed in the HPI      Objective:    BP 118/69 (BP Location: Left Arm, Patient Position: Sitting, Cuff Size: Normal)   Pulse 85   Temp 98.4 F (36.9 C) (Oral)   Resp 18   Ht '5\' 10"'$  (1.778 m)   Wt 158 lb (71.7 kg)   BMI 22.67 kg/m    Physical Exam Vitals reviewed.  Constitutional:      Appearance: Normal appearance. He is normal weight.  HENT:     Head: Normocephalic and atraumatic.     Right Ear: There is impacted cerumen.     Left Ear: There is impacted cerumen.     Nose: Nose normal.     Mouth/Throat:     Mouth: Mucous membranes are moist.     Pharynx: Oropharynx is clear. Posterior oropharyngeal erythema present.  Eyes:      Conjunctiva/sclera: Conjunctivae normal.  Cardiovascular:     Rate and Rhythm: Normal rate and regular rhythm.     Pulses: Normal pulses.     Heart sounds: Normal heart sounds.  Pulmonary:     Effort: Pulmonary effort is normal.     Breath sounds: Normal breath sounds.  Musculoskeletal:     Cervical back: No tenderness.  Lymphadenopathy:     Cervical: No cervical adenopathy.  Skin:    General: Skin is warm and dry.  Neurological:     Mental Status: He is alert and oriented to person, place, and time.  Psychiatric:        Mood and Affect: Mood normal.        Behavior: Behavior normal.        Thought Content: Thought content normal.        Judgment: Judgment normal.      Results for orders placed or performed in visit on 06/07/22  POC COVID-19 BinaxNow  Result Value Ref Range   SARS Coronavirus 2 Ag Negative Negative  POCT Influenza A/B  Result Value Ref Range   Influenza A, POC Positive (A) Negative   Influenza B, POC Negative Negative        Assessment & Plan:   Problem List Items Addressed  This Visit   None Visit Diagnoses     Acute cough    -  Primary Flu A positive COVID negative    Relevant Orders   POC COVID-19 BinaxNow (Completed)   POCT Influenza A/B (Completed)   Influenza A     Flu A positive Sending in Tamiflu, prednisone, cough medicine, and zofran for nausea Continue supportive measures including rest, hydration, humidifier use, steam showers, warm compresses to sinuses, warm liquids with lemon and honey, and over-the-counter cough, cold, and analgesics as needed.  Meds discussed and information attached to AVS.  Please contact office for follow-up if symptoms do not improve or worsen. Seek emergency care if symptoms become severe.    Relevant Medications   oseltamivir (TAMIFLU) 75 MG capsule   predniSONE (DELTASONE) 20 MG tablet   benzonatate (TESSALON) 200 MG capsule   chlorpheniramine-HYDROcodone (TUSSIONEX) 10-8 MG/5ML   ondansetron  (ZOFRAN) 4 MG tablet       Meds ordered this encounter  Medications   oseltamivir (TAMIFLU) 75 MG capsule    Sig: Take 1 capsule (75 mg total) by mouth 2 (two) times daily.    Dispense:  10 capsule    Refill:  0    Order Specific Question:   Supervising Provider    Answer:   Penni Homans A [[id number]]   predniSONE (DELTASONE) 20 MG tablet    Sig: Take 2 tablets (40 mg total) by mouth daily with breakfast for 5 days.    Dispense:  10 tablet    Refill:  0    Order Specific Question:   Supervising Provider    Answer:   Penni Homans A [[id number]]   benzonatate (TESSALON) 200 MG capsule    Sig: Take 1 capsule (200 mg total) by mouth 2 (two) times daily as needed for cough.    Dispense:  20 capsule    Refill:  0    Order Specific Question:   Supervising Provider    Answer:   Penni Homans A [[id number]]   chlorpheniramine-HYDROcodone (TUSSIONEX) 10-8 MG/5ML    Sig: Take 5 mLs by mouth every 12 (twelve) hours as needed for up to 5 days.    Dispense:  50 mL    Refill:  0    Order Specific Question:   Supervising Provider    Answer:   Penni Homans A [[id number]]   ondansetron (ZOFRAN) 4 MG tablet    Sig: Take 1 tablet (4 mg total) by mouth every 8 (eight) hours as needed for nausea or vomiting.    Dispense:  20 tablet    Refill:  0    Order Specific Question:   Supervising Provider    Answer:   Penni Homans A [[id number]]    Return if symptoms worsen or fail to improve.  Terrilyn Saver, NP

## 2022-06-23 ENCOUNTER — Other Ambulatory Visit: Payer: Self-pay | Admitting: Family Medicine

## 2022-06-24 ENCOUNTER — Ambulatory Visit (INDEPENDENT_AMBULATORY_CARE_PROVIDER_SITE_OTHER)
Admission: RE | Admit: 2022-06-24 | Discharge: 2022-06-24 | Disposition: A | Discharge status: Home / Self Care | Payer: Medicare Other | Source: Ambulatory Visit | Attending: Family | Admitting: Family

## 2022-06-24 ENCOUNTER — Ambulatory Visit (INDEPENDENT_AMBULATORY_CARE_PROVIDER_SITE_OTHER): Payer: Medicare Other | Admitting: Family

## 2022-06-24 ENCOUNTER — Encounter: Payer: Self-pay | Admitting: Family

## 2022-06-24 ENCOUNTER — Other Ambulatory Visit (INDEPENDENT_AMBULATORY_CARE_PROVIDER_SITE_OTHER): Payer: Medicare Other

## 2022-06-24 VITALS — BP 118/62 | HR 67 | Temp 97.8°F | Ht 70.0 in | Wt 159.0 lb

## 2022-06-24 DIAGNOSIS — R053 Chronic cough: Secondary | ICD-10-CM | POA: Diagnosis not present

## 2022-06-24 DIAGNOSIS — R059 Cough, unspecified: Secondary | ICD-10-CM | POA: Diagnosis not present

## 2022-06-24 DIAGNOSIS — E039 Hypothyroidism, unspecified: Secondary | ICD-10-CM

## 2022-06-24 DIAGNOSIS — R5383 Other fatigue: Secondary | ICD-10-CM | POA: Diagnosis not present

## 2022-06-24 LAB — COMPREHENSIVE METABOLIC PANEL
ALT: 19 U/L (ref 0–53)
AST: 20 U/L (ref 0–37)
Albumin: 3.7 g/dL (ref 3.5–5.2)
Alkaline Phosphatase: 59 U/L (ref 39–117)
BUN: 9 mg/dL (ref 6–23)
CO2: 30 mEq/L (ref 19–32)
Calcium: 8.9 mg/dL (ref 8.4–10.5)
Chloride: 97 mEq/L (ref 96–112)
Creatinine, Ser: 0.77 mg/dL (ref 0.40–1.50)
GFR: 83.35 mL/min (ref >60.00)
Glucose, Bld: 89 mg/dL (ref 70–99)
Potassium: 4.1 mEq/L (ref 3.5–5.1)
Sodium: 133 mEq/L — ABNORMAL LOW (ref 135–145)
Total Bilirubin: 0.8 mg/dL (ref 0.2–1.2)
Total Protein: 6.8 g/dL (ref 6.0–8.3)

## 2022-06-24 LAB — CBC WITH DIFFERENTIAL/PLATELET
Basophils Absolute: 0 10*3/uL (ref 0.0–0.1)
Basophils Relative: 0.4 % (ref 0.0–3.0)
Eosinophils Absolute: 0.1 10*3/uL (ref 0.0–0.7)
Eosinophils Relative: 1.8 % (ref 0.0–5.0)
HCT: 38.1 % — ABNORMAL LOW (ref 39.0–52.0)
Hemoglobin: 13.4 g/dL (ref 13.0–17.0)
Lymphocytes Relative: 31.9 % (ref 12.0–46.0)
Lymphs Abs: 2.2 10*3/uL (ref 0.7–4.0)
MCHC: 35.2 g/dL (ref 30.0–36.0)
MCV: 93.1 fl (ref 78.0–100.0)
Monocytes Absolute: 0.7 10*3/uL (ref 0.1–1.0)
Monocytes Relative: 10.3 % (ref 3.0–12.0)
Neutro Abs: 3.9 10*3/uL (ref 1.4–7.7)
Neutrophils Relative %: 55.6 % (ref 43.0–77.0)
Platelets: 230 10*3/uL (ref 150.0–400.0)
RBC: 4.09 Mil/uL — ABNORMAL LOW (ref 4.22–5.81)
RDW: 13.1 % (ref 11.5–15.5)
WBC: 7 10*3/uL (ref 4.0–10.5)

## 2022-06-24 LAB — TSH: TSH: 1.62 u[IU]/mL (ref 0.35–5.50)

## 2022-06-24 MED ORDER — AZITHROMYCIN 250 MG PO TABS: ORAL_TABLET | ORAL | 0 refills | Status: DC

## 2022-06-24 MED ORDER — PREDNISONE 20 MG PO TABS: 20.0000 mg | ORAL_TABLET | Freq: Every day | ORAL | 0 refills | Status: DC

## 2022-06-24 NOTE — Progress Notes (Signed)
James [name] is a 82 y.o. 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   Anxiety state 05/18/2014   Low testosterone 03/16/2014   Insomnia 03/16/2014   Preventative health care 03/16/2014   Diverticulosis of colon without hemorrhage 03/16/2014   OA (osteoarthritis) of knee 10/19/2013   AAA (abdominal aortic aneurysm) without rupture ([place]) 09/08/2013   Erectile dysfunction 09/08/2013   Benign paroxysmal positional vertigo 04/06/2013   Chest pain 01/03/2012   Hyperlipidemia 01/03/2012   CAD (coronary artery disease) 01/03/2012   Hypothyroidism 01/03/2012   GERD (gastroesophageal reflux disease) 01/03/2012   History of atrial fibrillation    Mitral valve prolapse     Current Outpatient Medications  Medication Sig Dispense Refill   ASHWAGANDHA PO Take 1 tablet by mouth daily.     atorvastatin (LIPITOR) 20 MG tablet Take 1 tablet (20 mg total) by mouth daily. 90 tablet 3   azithromycin (ZITHROMAX Z-PAK) 250 MG tablet Take 2 tablets (500 mg) PO today, then 1 tablet (250 mg) PO daily x4 days. 6 tablet 0   Calcium Carb-Cholecalciferol (CALCIUM 1000 + D PO) Take 1 tablet by mouth 3 (three) times a week.      COLLAGEN PO Take 1 capsule by mouth daily. (Chewable - has hyaluronic acid, collagen and biotin)     Cyanocobalamin (VITAMIN B 12 PO) Take 3,000 mcg by mouth daily.     diclofenac Sodium (VOLTAREN) 1 % GEL Apply 4 g topically 4 (four) times daily. To affected  joint. 100 g 11   Dutasteride-Tamsulosin HCl 0.5-0.4 MG CAPS Take 1 capsule by mouth at bedtime. 90 capsule 1   ELDERBERRY PO Take 50 mg by mouth 3 (three) times a week.     fluticasone (FLONASE) 50 MCG/ACT nasal spray Place 2 sprays into both nostrils daily. 16 g 6   levothyroxine (SYNTHROID) 100 MCG tablet TAKE 1 TABLET BY MOUTH EVERY MORNING BEFORE BREAKFAST 90 tablet 1   LORazepam (ATIVAN) 0.5 MG tablet Take 1 tablet (0.5 mg total) by mouth 2 (two) times daily as needed. for anxiety 30 tablet 1   Melatonin 10 MG TABS Take 10 mg by mouth at bedtime.     metoprolol succinate (TOPROL XL) 25 MG 24 hr tablet Take 0.5 tablets (12.5 mg total) by mouth at bedtime. 90 tablet 3   Milk Thistle 1000 MG CAPS Take 1,000 mg by mouth in the morning.     mirtazapine (REMERON) 15 MG tablet TAKE 1 TABLET BY MOUTH AT BEDTIME 90 tablet 0   Multiple Vitamins-Minerals (MULTIVITAMINS THER. W/MINERALS) TABS Take 1 tablet by mouth daily.     pantoprazole (PROTONIX) 40 MG tablet TAKE 1 TABLET BY MOUTH 2 TIMES A DAY BEFORE MEALS FOR ACID REFLUX (Patient taking differently: 40 mg daily.) 180 tablet 3   predniSONE (DELTASONE) 20 MG tablet Take 1 tablet (20 mg total) by mouth daily with breakfast. 5 tablet 0   Probiotic Product (PROBIOTIC DAILY PO) Take 1 tablet by mouth  daily.     sildenafil (REVATIO) 20 MG tablet TAKE 1 TO 5 TABLETS BY MOUTH EVERY DAY AS NEEDED 35 tablet 1   Testosterone 30 MG/ACT SOLN APPLY 1 PUMP TOPICALLY DAILY. FIVE DAYS A WEEK (Patient taking differently: Apply 1 Pump topically daily. Under each arm Five days a week) 180 mL 0   Turmeric (QC TUMERIC COMPLEX) 500 MG CAPS Take 1 tablet by mouth daily.     zolpidem (AMBIEN) 10 MG tablet TAKE 1 TABLET BY MOUTH AT BEDTIME AS NEEDED FOR SLEEP 30 tablet 3   No current facility-administered medications for this visit.    Allergies: Sulfa antibiotics  Past Medical History:  Diagnosis Date   AAA (abdominal aortic aneurysm) without rupture ([place]) 09/08/2013    Abdominal bloating 05/19/2016   Anxiety state 05/18/2014   Atrial fibrillation (HCC)    BCC (basal cell carcinoma of skin) 06/09/2014   Benign paroxysmal positional vertigo 04/06/2013   CAD (coronary artery disease)    nonobstructive by cath 2008; nonobstructive by Cardiac CT 11/2011   Cervical spine fracture ([place]) 1980   C6-7   Diverticulosis    Erectile dysfunction 09/08/2013   GERD (gastroesophageal reflux disease)    Hiatal hernia    History of atrial fibrillation    History of squamous cell carcinoma 10-05-10   Dr [name], [place] (hyperlipidemia)    Hypothyroidism    Medicare annual wellness visit, subsequent 03/16/2014   Sees [facility] Follows with Dr Johnsie Cancel of cardiology Sees [facility] for colonoscopy, last colonoscopy in 2013    Mitral valve prolapse    Neck pain 05/03/2015   Preventative health care 03/16/2014   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   s/p excision   Syncope 02/03/2013   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    Past Surgical History:  Procedure Laterality Date   APPENDECTOMY  05/2011   CARDIAC CATHETERIZATION  01/03/12   30% ostial LAD stenosis, ostial 30-40% D1 stenosis, smooth and normal left main and RCA; LVEF 65%   CHOLECYSTECTOMY N/A 08/25/2014   Procedure: LAPAROSCOPIC CHOLECYSTECTOMY WITH INTRAOPERATIVE CHOLANGIOGRAM;  Surgeon: [name], MD;  Location: [place];  Service: General;  Laterality: N/A;   INGUINAL HERNIA REPAIR  07/2010   right   KNEE ARTHROPLASTY     bilat   LEFT HEART CATH AND CORONARY ANGIOGRAPHY N/A 10/01/2021   Procedure: LEFT HEART CATH AND CORONARY ANGIOGRAPHY;  Surgeon: Belva Crome, MD;  Location: [place] [facility];  Service: Cardiovascular;  Laterality: N/A;   LEFT HEART CATHETERIZATION WITH CORONARY ANGIOGRAM N/A 01/03/2012   Procedure: LEFT HEART CATHETERIZATION WITH CORONARY ANGIOGRAM;  Surgeon: [name], MD;  Location: [facility] CATH LAB;  Service: Cardiovascular;  Laterality: N/A;   MRI   10/31/2016   TONSILLECTOMY  1947   tumor removal  1958   "fatty tumor"    Family History  Problem Relation Age of Onset   Heart disease Mother        MI 66s   Stroke Mother    Hypertension Mother    Heart disease Maternal Grandfather        MI 10-80s   Heart disease Maternal Grandmother        MI 67-80s   Lung disease Father        penumonitis   Cancer Sister        colon   Cancer Paternal Uncle        GI: colon he thinks    Social History  Tobacco Use   Smoking status: Never   Smokeless tobacco: Never  Substance Use Topics   Alcohol use: Yes    Comment: 2 glasses red wine/day    Subjective:   Accompanied by his sister today; Notes has been sick since mid- November- "just can't shake it;" Treated for acute sinusitis with Augmentin for 7 days in mid- November; did not feel resolved- re-treated with Doxycycline x 10 days;  Felt some improvement for about 1 week before being diagnosed with influenza on December 19; Continuing to feel very congested/ "weak." No fevers, night sweats;     Objective:  Vitals:   06/24/22 0851  BP: 118/62  Pulse: 67  Temp: 97.8 F (36.6 C)  TempSrc: Oral  SpO2: 99%  Weight: 159 lb (72.1 kg)  Height: '5\' 10"'$  (1.778 m)    General: Well developed, well nourished, in no acute distress  Skin : Warm and dry.  Head: Normocephalic and atraumatic  Eyes: Sclera and conjunctiva clear; pupils round and reactive to light; extraocular movements intact  Ears: External normal; canals clear; tympanic membranes normal  Oropharynx: Pink, supple. No suspicious lesions  Neck: Supple without thyromegaly, adenopathy  Lungs: Respirations unlabored; clear to auscultation bilaterally without wheeze, rales, rhonchi  CVS exam: normal rate and regular rhythm.  Neurologic: Alert and oriented; speech intact; face symmetrical; moves all extremities well; CNII-XII intact without focal deficit   Assessment:  1. Other fatigue   2. Hypothyroidism, unspecified  type   3. Persistent cough for 3 weeks or longer     Plan:  Will update CXR and labs today including CBC, CMP, TSH; suspect secondary bacterial infection from recent flu;  Will treat with Z-pak and second course of prednisone; continue Flonase and add Zyrtec;  Follow up to be determined;   No follow-ups on file.  Orders Placed This Encounter  Procedures   DG Chest 2 View    Standing Status:   Future    Standing Expiration Date:   06/25/2023    Order Specific Question:   Reason for Exam (SYMPTOM  OR DIAGNOSIS REQUIRED)    Answer:   persistent cough x 2 months    Order Specific Question:   Preferred imaging location?    Answer:   [facility]   CBC with Differential/Platelet    Standing Status:   Future    Standing Expiration Date:   06/25/2023   Comp Met (CMET)    Standing Status:   Future    Standing Expiration Date:   06/25/2023   TSH    Standing Status:   Future    Standing Expiration Date:   06/25/2023    Requested Prescriptions   Signed Prescriptions Disp Refills   azithromycin (ZITHROMAX Z-PAK) 250 MG tablet 6 tablet 0    Sig: Take 2 tablets (500 mg) PO today, then 1 tablet (250 mg) PO daily x4 days.   predniSONE (DELTASONE) 20 MG tablet 5 tablet 0    Sig: Take 1 tablet (20 mg total) by mouth daily with breakfast.

## 2022-06-24 NOTE — Patient Instructions (Signed)
Please add OTC Zyrtec 10 mg daily x 10-14 days; ( this is the antihistamine); Please take Flonase twice a day for the next 3-5 days and then daily for another 10 days;  We will be in touch once results of labs and CXR are back;

## 2022-07-19 ENCOUNTER — Ambulatory Visit: Payer: Medicare Other | Admitting: Family Medicine

## 2022-07-20 ENCOUNTER — Ambulatory Visit (INDEPENDENT_AMBULATORY_CARE_PROVIDER_SITE_OTHER): Payer: Medicare Other | Admitting: Pharmacist

## 2022-07-20 DIAGNOSIS — F411 Generalized anxiety disorder: Secondary | ICD-10-CM

## 2022-07-20 DIAGNOSIS — I2583 Coronary atherosclerosis due to lipid rich plaque: Secondary | ICD-10-CM

## 2022-07-20 DIAGNOSIS — I2510 Atherosclerotic heart disease of native coronary artery without angina pectoris: Secondary | ICD-10-CM

## 2022-07-20 DIAGNOSIS — I10 Essential (primary) hypertension: Secondary | ICD-10-CM

## 2022-07-20 DIAGNOSIS — N400 Enlarged prostate without lower urinary tract symptoms: Secondary | ICD-10-CM

## 2022-07-20 DIAGNOSIS — E039 Hypothyroidism, unspecified: Secondary | ICD-10-CM

## 2022-07-21 NOTE — Progress Notes (Signed)
Pharmacy Note  07/20/2022 Name: James [name] MRN: [id number] DOB: 1940-08-15  Subjective: James [name] is a 82 y.o. year old male who is a primary care patient of [name], MD. Clinical Pharmacist Practitioner referral was placed to assist with medication management.    Engaged with patient by telephone for follow up visit today.  Hypertension / Cardiomyopathy / palpitations:  Patient is a regular exercises though he has several acute illness in November, December and January (flu, sinusitis) that kept him from exercising as usual. He has restarted power walking 3 days per week - about 3 miles in 40 minutes. He is training to compete in the spring in the [employer].     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 Patient reports he is sleeping well most nights of the week   Low Testosterone:  Current therapy: Testosterone topical - '30mg'$ /dose - apply 1 pump under each arm each morning 5 days per week  Lab Results  Component Value Date   TESTOSTERONE 464.28 07/15/2021   Last CBC showed Hgb was WNL; HCT was slightly low. PSA prior to starting testosterone and was 0.51 (checked 2016)    Lab Results  Component Value Date   PSA 0.40  09/06/2021   PSA 0.47 01/09/2018   PSA 0.51 04/23/2015      BPH:  Patient endorses that symptoms of BPH - urinary hesitancy and frequency are managed with current therapy Current regimen:  Dutasteride - tamsulosin 0.5/0.'4mg'$  at bedtime   Hypothyroidism:  Current regimen: Levothyroxine 176mg daily (per patient he is taking only 6 days per week)  Lab Results  Component Value Date   TSH 1.62 06/24/2022    Objective: Review of patient status, including review of consultants reports, laboratory and other test data, was performed as part of comprehensive.  Lab Results  Component Value Date   CREATININE 0.77 06/24/2022   CREATININE 0.88 04/18/2022   CREATININE 0.87 09/29/2021    Lab Results  Component Value Date   HGBA1C 5.5 04/18/2022       Component Value Date/Time   CHOL 126 04/18/2022 1450   TRIG 61.0 04/18/2022 1450   HDL 62.60 04/18/2022 1450   CHOLHDL 2 04/18/2022 1450   VLDL 12.2 04/18/2022 1450   LDLCALC 51 04/18/2022 1450   LDLCALC 46 07/09/2020 1526     Clinical ASCVD: Yes  The ASCVD Risk score (Arnett DK, et al., 2019) failed to calculate for the following reasons:   The 2019 ASCVD risk score is only valid for ages 458to 746   BP Readings from Last 3 Encounters:  06/24/22 118/62  06/07/22 118/69  05/20/22 130/76     Allergies  Allergen Reactions   Sulfa Antibiotics Other (See Comments)    Childhood allergy    Medications Reviewed Today     Reviewed by ECherre Robins RPH-CPP (Pharmacist) on  07/20/22 at 1439  Med List Status: <None>   Medication Order Taking? Sig Documenting Provider Last Dose Status Informant  ASHWAGANDHA PO [id number] Yes Take 1 tablet by mouth daily. [provider] Taking Active Self  atorvastatin (LIPITOR) 20 MG tablet [id number] Yes Take 1 tablet (20 mg total) by mouth daily. [name], MD Taking Active Self  Calcium Carb-Cholecalciferol (CALCIUM 1000 + D PO) [id number] Yes Take 1 tablet by mouth 3 (three) times  a week.  [provider] Taking Active Self  COLLAGEN PO [id number] Yes Take 1 capsule by mouth daily. (Chewable - has hyaluronic acid, collagen and biotin) [provider] Taking Active   Cyanocobalamin (VITAMIN B 12 PO) [id number] Yes Take 3,000 mcg by mouth daily. [provider] Taking Active Self  diclofenac Sodium (VOLTAREN) 1 % GEL [id number] Yes Apply 4 g topically 4 (four) times daily. To affected joint. Rosemarie Ax, MD Taking Active Self  Dutasteride-Tamsulosin HCl 0.5-0.4 MG CAPS [id number] Yes Take 1 capsule by mouth at bedtime. [name], MD Taking Active   ELDERBERRY PO [id number] Yes Take 50 mg by mouth. 4 or 5 times per week [provider] Taking Active Self  fluticasone (FLONASE) 50 MCG/ACT nasal spray [id number] No Place 2 sprays into both nostrils daily.  Patient not taking: Reported on 07/20/2022   Charyl Dancer, NP Not Taking Active   levothyroxine (SYNTHROID) 100 MCG tablet [id number] Yes TAKE 1 TABLET BY MOUTH EVERY MORNING BEFORE BREAKFAST [name], MD Taking Active            Med Note Antony Contras, [place] B   Wed Jul 20, 2022  2:33 PM) Taking 1 tablet 6 days per week  LORazepam (ATIVAN) 0.5 MG tablet [id number] Yes Take 1 tablet (0.5 mg total) by mouth 2 (two) times daily as needed. for anxiety [name], MD Taking Active   Melatonin 10 MG TABS [id number] Yes Take 10 mg by mouth at bedtime. [provider] Taking Active Self  metoprolol succinate (TOPROL XL) 25 MG 24 hr tablet [id number] Yes Take 0.5 tablets (12.5 mg total) by mouth at bedtime. Freada Bergeron, MD Taking Active   Milk Thistle 1000 MG CAPS [id number] Yes Take 1,000 mg by mouth in the morning. [provider] Taking Active Self  mirtazapine (REMERON) 15 MG tablet [id number] Yes TAKE 1 TABLET BY MOUTH AT BEDTIME [name], MD Taking Active   Multiple Vitamins-Minerals (MULTIVITAMINS THER. W/MINERALS) Sheral Flow [id number] Yes Take 1 tablet by  mouth daily. [provider] Taking Active Self  pantoprazole (PROTONIX) 40 MG tablet [id number] Yes TAKE 1 TABLET BY MOUTH 2 TIMES A DAY BEFORE MEALS FOR ACID REFLUX  Patient taking differently: 40 mg daily.   [name], MD Taking Active Self  Probiotic Product (PROBIOTIC DAILY PO) [id number] Yes Take 1 tablet by mouth daily. [provider] Taking Active Self  sildenafil (REVATIO) 20 MG tablet [id number] Yes TAKE 1 TO 5 TABLETS BY MOUTH EVERY DAY AS NEEDED [name], MD Taking Active   Testosterone 30 MG/ACT SOLN [id number] Yes APPLY 1 PUMP TOPICALLY DAILY. FIVE DAYS A WEEK  Patient taking differently: Apply 1 Pump topically daily. Under each arm Five days a week   [name], MD Taking Active   Turmeric (QC TUMERIC COMPLEX) 500 MG CAPS [id number] Yes Take 1 tablet by mouth daily. [provider] Taking Active   zolpidem (AMBIEN) 10 MG tablet [id number] Yes TAKE 1 TABLET BY MOUTH AT  BEDTIME AS NEEDED FOR SLEEP [name], MD Taking Active             Patient Active Problem List   Diagnosis Date Noted   Acute non-recurrent sinusitis 05/20/2022   Hyperglycemia 04/18/2022   Abnormal stress test    Unsteady gait 09/06/2021   Nocturia 09/06/2021   COVID-19 01/17/2021   Renal insufficiency 07/12/2020   Skin lesion of left leg 09/17/2019   Bilateral thoracic back pain 11/27/2016   Essential hypertension 11/27/2016   Abdominal bloating 05/19/2016   BPH (benign prostatic hyperplasia) 08/14/2015   Neck pain 05/03/2015   BCC (basal cell carcinoma of skin) 06/09/2014   Anxiety state 05/18/2014   Low testosterone 03/16/2014   Insomnia 03/16/2014   Preventative health care 03/16/2014   Diverticulosis of colon without hemorrhage 03/16/2014   OA (osteoarthritis) of knee 10/19/2013   AAA (abdominal aortic aneurysm) without rupture ([place]) 09/08/2013   Erectile dysfunction 09/08/2013   Benign paroxysmal positional vertigo 04/06/2013   Chest pain  01/03/2012   Hyperlipidemia 01/03/2012   CAD (coronary artery disease) 01/03/2012   Hypothyroidism 01/03/2012   GERD (gastroesophageal reflux disease) 01/03/2012   History of atrial fibrillation    Mitral valve prolapse      Medication Assistance:  None required.  Patient affirms current coverage meets needs.   Assessment / Plan: Hypertension / Cardiomyopathy / palpitations: blood pressure has been at goal < 130/80 Continue to exercise - goal of 150 minutes per week.  Continue Metoprolol '25mg'$ - take 0.5 tablet = 12.'5mg'$  daily Restart aspirin '81mg'$  daily per Dr Jacolyn Reedy notes   Hyperlipidemia: LDL goal < 70; At LDL goal Continue Atorvastatin '20mg'$  daily at bedtime Check lipids every 6 to 12 months.   Anxiety/Insomnia: controlled Continue current medications Lorazepam 0.'5mg'$  twice daily as needed (uses about once per week); Mirtazapine '15mg'$  daily at bedtime; Zolpidem '10mg'$  daily at bedtime (patient takes 1/2 tablet before bedtime and additional 1/2 tablet if wakes during night); Melatonin '10mg'$  daily at bedtime. Patient reports he is sleeping well most nights of the week   Low Testosterone:  Continue Testosterone topical - '30mg'$ /dose - apply 1 pump under each arm each morning 5 days per week  Continue to check CBC every 6 to 12 months Continue to check PSA and testosterone level at least yearly   BPH:  Continue Dutasteride - tamsulosin 0.5/0.'4mg'$  at bedtime   Hypothyroidism:  Continue Levothyroxine 127mg daily (per patient he is taking only 6 days per week)  Medication management:  Reviewed and updated medication list Reviewed refill history and adherence   Follow Up:  No further follow up required: Patient is meeting medication related goals   TCherre Robins PharmD Clinical Pharmacist [phone]

## 2022-09-01 ENCOUNTER — Other Ambulatory Visit: Payer: Self-pay | Admitting: Family Medicine

## 2022-09-01 NOTE — Telephone Encounter (Signed)
Patient called to make sure that we received refill request for zolpidem. I let him know we did receive it. Will be reviewed by provider or CMA covering refills likely sometime today but will be be address in 24 to 48 hours.

## 2022-09-01 NOTE — Telephone Encounter (Signed)
Requesting: Ambien '10mg'$   Contract: 09/03/21 UDS: 01/11/21 Last Visit: 06/24/22 w/ Mickel Baas Next Visit: 10/11/22 Last Refill: 05/09/22 #30 and 3RF  Please Advise

## 2022-09-06 ENCOUNTER — Telehealth: Payer: Self-pay | Admitting: Pharmacist

## 2022-09-06 ENCOUNTER — Telehealth (INDEPENDENT_AMBULATORY_CARE_PROVIDER_SITE_OTHER): Payer: Medicare Other | Admitting: Family Medicine

## 2022-09-06 ENCOUNTER — Encounter: Payer: Self-pay | Admitting: Family Medicine

## 2022-09-06 DIAGNOSIS — U071 COVID-19: Secondary | ICD-10-CM | POA: Diagnosis not present

## 2022-09-06 MED ORDER — NIRMATRELVIR/RITONAVIR (PAXLOVID)TABLET: 3.0000 | ORAL_TABLET | Freq: Two times a day (BID) | ORAL | 0 refills | Status: AC

## 2022-09-06 MED ORDER — GUAIFENESIN ER 600 MG PO TB12: 1200.0000 mg | ORAL_TABLET | Freq: Two times a day (BID) | ORAL | 0 refills | Status: AC | PRN

## 2022-09-06 NOTE — Patient Instructions (Signed)
Paxlovid sent in and medication reviewed - do not take your sildenafil or atorvastatin while on the Paxlovid.   Continue supportive measures including rest, hydration, humidifier use, steam showers, warm compresses to sinuses, warm liquids with lemon and honey, and over-the-counter cough, cold, and analgesics as needed.   Over the counter medications that may be helpful for symptoms:  Guaifenesin 1200 mg extended release tabs twice daily, with plenty of water For cough and congestion Brand name: Mucinex   Pseudoephedrine 30 mg, one or two tabs every 4 to 6 hours For sinus congestion Brand name: Sudafed You must get this from the pharmacy counter.  Oxymetazoline nasal spray each morning, one spray in each nostril, for NO MORE THAN 3 days  For nasal and sinus congestion Brand name: Afrin Saline nasal spray or Saline Nasal Irrigation 3-5 times a day For nasal and sinus congestion Brand names: Ocean or AYR Fluticasone nasal spray, one spray in each nostril, each morning (after oxymetazoline and saline, if used) For nasal and sinus congestion Brand name: Flonase Warm salt water gargles  For sore throat Every few hours as needed Alternate ibuprofen 400-600 mg and acetaminophen 1000 mg every 4-6 hours For fever, body aches, headache Brand names: Motrin or Advil and Tylenol Dextromethorphan 12-hour cough version 30 mg every 12 hours  For cough Brand name: Delsym Stop all other cold medications for now (Nyquil, Dayquil, Tylenol Cold, Theraflu, etc) and other non-prescription cough/cold preparations. Many of these have the same ingredients listed above and could cause an overdose of medication.   Herbal treatments that have been shown to be helpful in some patients include: Vitamin C 1000mg  per day Vitamin D 4000iU per day Zinc 100mg  per day Quercetin 25-500mg  twice a day Melatonin 5-10mg  at bedtime  General Instructions Allow your body to rest Drink PLENTY of fluids Isolate yourself  from everyone, even family, until test results have returned  If your COVID-19 test is positive Then you ARE INFECTED and you can pass the virus to others You must quarantine from others for a minimum of  5 days since symptoms started AND You are fever free for 24 hours WITHOUT any medication to reduce fever AND Your symptoms are improving Wear mask for the next 5 days If not improved by day 5, quarantine for the full 10 days. Do not go to the store or other public areas Do not go around household members who are not known to be infected with COVID-19 If you MUST leave your area of quarantine (example: go to a bathroom you share with others in your home), you must Wear a mask Wash your hands thoroughly Wipe down any surfaces you touch Do not share food, drinks, towels, or other items with other persons Dispose of your own tissues, food containers, etc  Once you have recovered, please continue good preventive care measures, including:  wearing a mask when in public wash your hands frequently avoid touching your face/nose/eyes cover coughs/sneezes with the inside of your elbow stay out of crowds keep a 6 foot distance from others  If you develop severe shortness of breath, uncontrolled fevers, coughing up blood, confusion, chest pain, or signs of dehydration (such as significantly decreased urine amounts or dizziness with standing) please go to the ER.

## 2022-09-06 NOTE — Telephone Encounter (Signed)
Pt called and VV was scheduled

## 2022-09-06 NOTE — Progress Notes (Signed)
Virtual Video Visit via MyChart Note  I connected with  James [name] on 09/06/22 at 10:40 AM EDT by the video enabled telemedicine application for MyChart, and verified that I am speaking with the correct person using two identifiers.   I introduced myself as a Designer, jewellery with the practice. We discussed the limitations of evaluation and management by telemedicine and the availability of in person appointments. The patient expressed understanding and agreed to proceed.  Participating parties in this visit include: The patient and the nurse practitioner listed.  The patient is: At home I am: In the office - [facility] [place]:    CC:  Chief Complaint  Patient presents with   Covid Positive    Symptoms Started 3.18.24    HPI: James [name] is a 82 y.o. year old male presenting today via [place] today for COVID +.  Upper Respiratory Infection: Patient complains of symptoms of a URI. Symptoms include  cough, fatigue, body aches, fever, chills, nasal congestion, rhinorrhea . Onset of symptoms was 1 day ago, unchanged since that time. He is drinking plenty of fluids. Evaluation to date: none. Treatment to date: none. He denies any chest pain, dyspnea, wheezing, GI/GU symptoms.      Past medical history, Surgical history, Family history not pertinant except as noted below, Social history, Allergies, and medications have been entered into the medical record, reviewed, and corrections made.   Review of Systems:  All review of systems negative except what is listed in the HPI   Objective:    General:  Speaking clearly in complete sentences. Absent shortness of breath noted.   Alert and oriented x3.   Normal judgment.  Absent acute distress.   Impression and Recommendations:    1. COVID-19 - nirmatrelvir/ritonavir (PAXLOVID) 20 x 150 MG & 10 x 100MG  TABS; Take 3 tablets by mouth 2 (two) times daily for 5 days. (Take nirmatrelvir 150 mg  two tablets twice daily for 5 days and ritonavir 100 mg one tablet twice daily for 5 days) Patient GFR is 83  Dispense: 30 tablet; Refill: 0 - guaiFENesin (MUCINEX) 600 MG 12 hr tablet; Take 2 tablets (1,200 mg total) by mouth 2 (two) times daily as needed.  Dispense: 30 tablet; Refill: 0  Paxlovid sent in and medication reviewed - do not take your sildenafil or atorvastatin while on the Paxlovid.   Continue supportive measures including rest, hydration, humidifier use, steam showers, warm compresses to sinuses, warm liquids with lemon and honey, and over-the-counter cough, cold, and analgesics as needed.   Patient aware of signs/symptoms requiring further/urgent evaluation.    Follow-up if symptoms worsen or fail to improve.    I discussed the assessment and treatment plan with the patient. The patient was provided an opportunity to ask questions and all were answered. The patient agreed with the plan and demonstrated an understanding of the instructions.   The patient was advised to call back or seek an in-person evaluation if the symptoms worsen or if the condition fails to improve as anticipated.    Terrilyn Saver, NP

## 2022-09-06 NOTE — Telephone Encounter (Signed)
Patient called requesting Rx for Paxlovid. He reports testing positive for COVID this morning.  Forwarding message to scheduler to make Video Visit appointment for assessment.

## 2022-09-17 ENCOUNTER — Other Ambulatory Visit: Payer: Self-pay | Admitting: Family Medicine

## 2022-09-20 ENCOUNTER — Telehealth: Payer: Self-pay | Admitting: Family Medicine

## 2022-09-20 NOTE — Telephone Encounter (Signed)
Contacted James [name] to schedule their annual wellness visit. Appointment made for 09/26/2022.  Sherol Dade; Care Guide [facility] Direct Dial: [phone]

## 2022-09-26 ENCOUNTER — Ambulatory Visit (INDEPENDENT_AMBULATORY_CARE_PROVIDER_SITE_OTHER): Payer: Medicare Other | Admitting: *Deleted

## 2022-09-26 VITALS — BP 120/74 | HR 49 | Ht 70.0 in | Wt 162.6 lb

## 2022-09-26 DIAGNOSIS — Z0000 Encounter for general adult medical examination without abnormal findings: Secondary | ICD-10-CM

## 2022-09-26 NOTE — Progress Notes (Signed)
Subjective:   James [name] is a 82 y.o. male who presents for Medicare Annual/Subsequent preventive examination.  Review of Systems     Cardiac Risk Factors include: advanced age (>9men, >22 women);hypertension;male gender     Objective:    Today's Vitals   09/26/22 1419  BP: 120/74  Pulse: (!) 49  Weight: 162 lb 9.6 oz (73.8 kg)  Height:  (1.778 m)   Body mass index is 23.33 kg/m.     10/01/2021    7:20 AM 09/23/2021   11:04 AM 09/21/2021    1:27 PM 07/09/2020    3:55 PM 06/29/2020    2:11 PM 03/07/2020   11:52 AM 06/27/2019    1:21 PM  Advanced Directives  Does Patient Have a Medical Advance Directive? No No No No No No No  Would patient like information on creating a medical advance directive? No - Patient declined  Yes (MAU/Ambulatory/Procedural Areas - Information given) No - Patient declined No - Patient declined  No - Patient declined    Current Medications (verified) Outpatient Encounter Medications as of 09/26/2022  Medication Sig   ASHWAGANDHA PO Take 1 tablet by mouth daily.   atorvastatin (LIPITOR) 20 MG tablet TAKE 1 TABLET BY MOUTH EVERY DAY FOR CHOLESTEROL   Calcium Carb-Cholecalciferol (CALCIUM 1000 + D PO) Take 1 tablet by mouth 3 (three) times a week.    COLLAGEN PO Take 1 capsule by mouth daily. (Chewable - has hyaluronic acid, collagen and biotin)   Cyanocobalamin (VITAMIN B 12 PO) Take 3,000 mcg by mouth daily.   diclofenac Sodium (VOLTAREN) 1 % GEL Apply 4 g topically 4 (four) times daily. To affected joint.   Dutasteride-Tamsulosin HCl 0.5-0.4 MG CAPS Take 1 capsule by mouth at bedtime.   ELDERBERRY PO Take 50 mg by mouth. 4 or 5 times per week   fluticasone (FLONASE) 50 MCG/ACT nasal spray Place 2 sprays into both nostrils daily. (Patient not taking: Reported on 07/20/2022)   guaiFENesin (MUCINEX) 600 MG 12 hr tablet Take 2 tablets (1,200 mg total) by mouth 2 (two) times daily as needed.   levothyroxine (SYNTHROID) 100 MCG tablet TAKE 1 TABLET BY  MOUTH EVERY MORNING BEFORE BREAKFAST   LORazepam (ATIVAN) 0.5 MG tablet Take 1 tablet (0.5 mg total) by mouth 2 (two) times daily as needed. for anxiety   Melatonin 10 MG TABS Take 10 mg by mouth at bedtime.   metoprolol succinate (TOPROL XL) 25 MG 24 hr tablet Take 0.5 tablets (12.5 mg total) by mouth at bedtime.   Milk Thistle 1000 MG CAPS Take 1,000 mg by mouth in the morning.   mirtazapine (REMERON) 15 MG tablet TAKE 1 TABLET BY MOUTH AT BEDTIME   Multiple Vitamins-Minerals (MULTIVITAMINS THER. W/MINERALS) TABS Take 1 tablet by mouth daily.   pantoprazole (PROTONIX) 40 MG tablet TAKE 1 TABLET BY MOUTH 2 TIMES A DAY BEFORE MEALS FOR ACID REFLUX (Patient taking differently: 40 mg daily.)   Probiotic Product (PROBIOTIC DAILY PO) Take 1 tablet by mouth daily.   sildenafil (REVATIO) 20 MG tablet TAKE 1 TO 5 TABLETS BY MOUTH EVERY DAY AS NEEDED   Testosterone 30 MG/ACT SOLN APPLY 1 PUMP TOPICALLY DAILY. FIVE DAYS A WEEK (Patient taking differently: Apply 1 Pump topically daily. Under each arm Five days a week)   Turmeric (QC TUMERIC COMPLEX) 500 MG CAPS Take 1 tablet by mouth daily.   zolpidem (AMBIEN) 10 MG tablet TAKE 1 TABLET BY MOUTH AT BEDTIME AS NEEDED FOR SLEEP  No facility-administered encounter medications on file as of 09/26/2022.    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   Benign paroxysmal positional vertigo 04/06/2013   CAD (coronary artery disease)    nonobstructive by cath 2008; nonobstructive by Cardiac CT 11/2011   Cervical spine fracture 1980   C6-7   Diverticulosis    Erectile dysfunction 09/08/2013   GERD (gastroesophageal reflux disease)    Hiatal hernia    History of atrial fibrillation    History of squamous cell carcinoma  10-05-10   Dr Cristopher Est[place]y [place], [place]   HLD (hyperlipidemia)    Hypothyroidism    Medicare annual wellness visit, subsequent 03/16/2014   Sees [facility] Follows with Dr Eden EmmsNishan of cardiology Sees [facility] Sees [facility] for colonoscopy, last colonoscopy in 2013    Mitral valve prolapse    Neck pain 05/03/2015   Preventative health care 03/16/2014   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   s/p excision   Syncope 02/03/2013   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   Past Surgical History:  Procedure Laterality Date   APPENDECTOMY  05/2011   CARDIAC CATHETERIZATION  01/03/12   30% ostial LAD stenosis, ostial 30-40% D1 stenosis, smooth and normal left main and RCA; LVEF 65%   CHOLECYSTECTOMY N/A 08/25/2014   Procedure: LAPAROSCOPIC CHOLECYSTECTOMY WITH INTRAOPERATIVE CHOLANGIOGRAM;  Surgeon: Avel Peaceodd Rosenbower, MD;  [facility];  Service: General;  Laterality: N/A;   INGUINAL HERNIA REPAIR  07/2010   right   KNEE ARTHROPLASTY     bilat   LEFT HEART CATH AND CORONARY ANGIOGRAPHY N/A 10/01/2021   Procedure: LEFT HEART CATH AND CORONARY ANGIOGRAPHY;  Surgeon: Lyn RecordsSmith, Henry W, MD;  Location: [facility] INVASIVE [facility];  Service: Cardiovascular;  Laterality: N/A;   LEFT HEART CATHETERIZATION WITH CORONARY ANGIOGRAM N/A 01/03/2012   Procedure: LEFT HEART CATHETERIZATION WITH CORONARY ANGIOGRAM;  Surgeon: Vesta MixerPhilip J Nahser, MD;  [facility];  Service: Cardiovascular;  Laterality: N/A;   MRI  10/31/2016   TONSILLECTOMY  1947   tumor removal  1958   "fatty tumor"   Family History  Problem Relation Age of Onset   Heart disease Mother        MI 7980s   Stroke Mother    Hypertension Mother    Heart disease Maternal Grandfather        MI 4070-80s   Heart disease Maternal Grandmother        MI 3670-80s   Lung disease Father        penumonitis   Cancer Sister        colon   Cancer Paternal Uncle        GI: colon he thinks   Social History   Socioeconomic History   Marital status: Single    Spouse  name: Not on file   Number of children: Not on file   Years of education: Not on file   Highest education level: Not on file  Occupational History   Occupation: Art gallery managerengineer    Comment: Retired  Tobacco Use   Smoking status: Never   Smokeless tobacco: Never  Vaping Use   Vaping Use: Never used  Substance and Sexual Activity   Alcohol use: Yes    Comment: 2 glasses red wine/day   Drug use: No  Sexual activity: Yes  Other Topics Concern   Not on file  Social History Narrative   Not on file   Social Determinants of Health   Financial Resource Strain: Low Risk  (09/23/2021)   Overall Financial Resource Strain (CARDIA)    Difficulty of Paying Living Expenses: Not hard at all  Food Insecurity: No Food Insecurity (09/26/2022)   Hunger Vital Sign    Worried About Running Out of Food in the Last Year: Never true    Ran Out of Food in the Last Year: Never true  Transportation Needs: No Transportation Needs (09/26/2022)   PRAPARE - Administrator, Civil Service (Medical): No    Lack of Transportation (Non-Medical): No  Physical Activity: Sufficiently Active (01/26/2022)   Exercise Vital Sign    Days of Exercise per Week: 4 days    Minutes of Exercise per Session: 60 min  Stress: No Stress Concern Present (09/23/2021)   [employer] of Occupational Health - Occupational Stress Questionnaire    Feeling of Stress : Not at all  Social Connections: Moderately Integrated (01/26/2022)   Social Connection and Isolation Panel [NHANES]    Frequency of Communication with Friends and Family: Three times a week    Frequency of Social Gatherings with Friends and Family: Three times a week    Attends Religious Services: More than 4 times per year    Active Member of Clubs or Organizations: Yes    Attends Banker Meetings: 1 to 4 times per year    Marital Status: Never married    Tobacco Counseling Counseling given: Not Answered   Clinical Intake:  Pre-visit preparation  completed: Yes  Pain : No/denies pain     BMI - recorded: 23.33 Nutritional Status: BMI of 19-24  Normal Nutritional Risks: None Diabetes: No  How often do you need to have someone help you when you read instructions, pamphlets, or other written materials from your doctor or pharmacy?: 1 - Never  Activities of Daily Living    09/26/2022    2:23 PM  In your present state of health, do you have any difficulty performing the following activities:  Hearing? 0  Vision? 0  Comment wears readers sometimes  Difficulty concentrating or making decisions? 0  Walking or climbing stairs? 0  Dressing or bathing? 0  Doing errands, shopping? 0  Preparing Food and eating ? N  Using the Toilet? N  In the past six months, have you accidently leaked urine? N  Do you have problems with loss of bowel control? N  Managing your Medications? N  Managing your Finances? N  Housekeeping or managing your Housekeeping? N    Patient Care Team: Bradd Canary, MD as PCP - General (Family Medicine) Meriam Sprague, MD as PCP - Cardiology (Cardiology) Mealor, Roberts Gaudy, MD as PCP - Electrophysiology (Cardiology) Hollar, Ronal Fear, MD as Referring Physician (Dermatology) Jeani Hawking, MD as Consulting Physician (Gastroenterology) Henrene Pastor, RPH-CPP (Pharmacist) Meriam Sprague, MD as Consulting Physician (Cardiology)  Indicate any recent Medical Services you may have received from other than [facility] providers in the past year (date may be approximate).     Assessment:   This is a routine wellness examination for [employer].  Hearing/Vision screen No results found.  Dietary issues and exercise activities discussed: Current Exercise Habits: Home exercise routine, Type of exercise: walking;Other - see comments (runs), Time (Minutes): 30, Frequency (Times/Week): 4, Weekly Exercise (Minutes/Week): 120, Intensity: Moderate, Exercise limited by: None identified  Goals Addressed   None     Depression Screen    09/26/2022    2:23 PM 06/24/2022    8:58 AM 05/20/2022   11:10 AM 04/18/2022    1:55 PM 09/23/2021   11:05 AM 09/06/2021    2:06 PM 01/11/2021    2:04 PM  PHQ 2/9 Scores  PHQ - 2 Score 0 0 0 0 0 0 0  PHQ- 9 Score    0       Fall Risk    09/26/2022    2:22 PM 06/24/2022    8:58 AM 05/20/2022   11:10 AM 04/18/2022    1:55 PM 09/23/2021   11:05 AM  Fall Risk   Falls in the past year? 0 0 0 0 0  Number falls in past yr: 0 0 0 0 0  Injury with Fall? 0 0 0 0 0  Risk for fall due to : No Fall Risks No Fall Risks   Medication side effect  Follow up Falls evaluation completed Falls evaluation completed  Falls evaluation completed Falls evaluation completed;Education provided;Falls prevention discussed    FALL RISK PREVENTION PERTAINING TO THE HOME:  Any stairs in or around the home? Yes  If so, are there any without handrails? No  Home free of loose throw rugs in walkways, pet beds, electrical cords, etc? Yes  Adequate lighting in your home to reduce risk of falls? Yes   ASSISTIVE DEVICES UTILIZED TO PREVENT FALLS:  Life alert? No  Use of a cane, walker or w/c? No  Grab bars in the bathroom? Yes  Shower chair or bench in shower? No  Elevated toilet seat or a handicapped toilet? No   TIMED UP AND GO:  Was the test performed? Yes .  Length of time to ambulate 10 feet: 5 sec.   Gait steady and fast without use of assistive device  Cognitive Function:    06/05/2017    1:27 PM 05/19/2016    3:06 PM  MMSE - Mini Mental State Exam  Orientation to time 5 5  Orientation to Place 5 5  Registration 3 3  Attention/ Calculation 5 5  Recall 3 3  Language- name 2 objects 2 2  Language- repeat 1 1  Language- follow 3 step command 3 3  Language- read & follow direction 1 1  Write a sentence 1 1  Copy design 1 1  Total score 30 30        09/26/2022    2:28 PM 09/23/2021   11:07 AM  6CIT Screen  What Year? 0 points 0 points  What month? 0 points 0 points  What  time? 0 points 0 points  Count back from 20 0 points 0 points  Months in reverse 0 points 0 points  Repeat phrase 0 points 0 points  Total Score 0 points 0 points    Immunizations Immunization History  Administered Date(s) Administered   Fluad Quad(high Dose 65+) 07/09/2020, 05/05/2021, 04/18/2022   Influenza Whole 03/20/2012   Influenza, High Dose Seasonal PF 04/02/2013, 04/23/2015   Influenza,inj,Quad PF,6+ Mos 03/11/2014, 04/08/2019   Influenza-Unspecified 03/03/2016, 03/20/2018   PFIZER(Purple Top)SARS-COV-2 Vaccination 07/06/2019, 07/27/2019, 04/24/2020   Pneumococcal Conjugate-13 03/11/2014   Pneumococcal Polysaccharide-23 04/18/2009   Tdap 08/05/2009   Zoster Recombinat (Shingrix) 08/24/2020, 11/17/2020   Zoster, Live 09/10/2008    TDAP status: Due, Education has been provided regarding the importance of this vaccine. Advised may receive this vaccine at local pharmacy or Health Dept. Aware to provide  a copy of the vaccination record if obtained from local pharmacy or Health Dept. Verbalized acceptance and understanding.  Flu Vaccine status: Up to date  Pneumococcal vaccine status: Up to date  Covid-19 vaccine status: Information provided on how to obtain vaccines.   Qualifies for Shingles Vaccine? Yes   Zostavax completed Yes   Shingrix Completed?: Yes  Screening Tests Health Maintenance  Topic Date Due   DTaP/Tdap/Td (2 - Td or Tdap) 08/06/2019   COVID-19 Vaccine (4 - 2023-24 season) 02/18/2022   Medicare Annual Wellness (AWV)  09/24/2022   INFLUENZA VACCINE  01/19/2023   Pneumonia Vaccine 31+ Years old  Completed   Zoster Vaccines- Shingrix  Completed   HPV VACCINES  Aged Out    Health Maintenance  Health Maintenance Due  Topic Date Due   DTaP/Tdap/Td (2 - Td or Tdap) 08/06/2019   COVID-19 Vaccine (4 - 2023-24 season) 02/18/2022   Medicare Annual Wellness (AWV)  09/24/2022    Colorectal cancer screening: No longer required.   Lung Cancer Screening:  (Low Dose CT Chest recommended if Age 67-80 years, 30 pack-year currently smoking OR have quit w/in 15years.) does not qualify.   Additional Screening:  Hepatitis C Screening: does not qualify  Vision Screening: Recommended annual ophthalmology exams for early detection of glaucoma and other disorders of the eye. Is the patient up to date with their annual eye exam?  No  Who is the provider or what is the name of the office in which the patient attends annual eye exams? Doesn't remember name at this time If pt is not established with a provider, would they like to be referred to a provider to establish care? No .   Dental Screening: Recommended annual dental exams for proper oral hygiene  Community Resource Referral / Chronic Care Management: CRR required this visit?  No   CCM required this visit?  No      Plan:     I have personally reviewed and noted the following in the patient's chart:   Medical and social history Use of alcohol, tobacco or illicit drugs  Current medications and supplements including opioid prescriptions. Patient is not currently taking opioid prescriptions. Functional ability and status Nutritional status Physical activity Advanced directives List of other physicians Hospitalizations, surgeries, and ER visits in previous 12 months Vitals Screenings to include cognitive, depression, and falls Referrals and appointments  In addition, I have reviewed and discussed with patient certain preventive protocols, quality metrics, and best practice recommendations. A written personalized care plan for preventive services as well as general preventive health recommendations were provided to patient.     Donne Anon, [place]   09/26/2022   Nurse Notes: None

## 2022-09-26 NOTE — Patient Instructions (Signed)
[name] , Thank you for taking time to come for your Medicare Wellness Visit. I appreciate your ongoing commitment to your health goals. Please review the following plan we discussed and let me know if I can assist you in the future.     This is a list of the screening recommended for you and due dates:  Health Maintenance  Topic Date Due   DTaP/Tdap/Td vaccine (2 - Td or Tdap) 08/06/2019   COVID-19 Vaccine (4 - 2023-24 season) 02/18/2022   Flu Shot  01/19/2023   Medicare Annual Wellness Visit  09/26/2023   Pneumonia Vaccine  Completed   Zoster (Shingles) Vaccine  Completed   HPV Vaccine  Aged Out     Next appointment: Follow up in one year for your annual wellness visit. CALL YOU EYE DOCTOR!  Preventive Care 7165 Years and Older, Male Preventive care refers to lifestyle choices and visits with your health care provider that can promote health and wellness. What does preventive care include? A yearly physical exam. This is also called an annual well check. Dental exams once or twice a year. Routine eye exams. Ask your health care provider how often you should have your eyes checked. Personal lifestyle choices, including: Daily care of your teeth and gums. Regular physical activity. Eating a healthy diet. Avoiding tobacco and drug use. Limiting alcohol use. Practicing safe sex. Taking low doses of aspirin every day. Taking vitamin and mineral supplements as recommended by your health care provider. What happens during an annual well check? The services and screenings done by your health care provider during your annual well check will depend on your age, overall health, lifestyle risk factors, and family history of disease. Counseling  Your health care provider may ask you questions about your: Alcohol use. Tobacco use. Drug use. Emotional well-being. Home and relationship well-being. Sexual activity. Eating habits. History of falls. Memory and ability to understand  (cognition). Work and work Astr[employer]. Screening  You may have the following tests or measurements: Height, weight, and BMI. Blood pressure. Lipid and cholesterol levels. These may be checked every 5 years, or more frequently if you are over 83 years old. Skin check. Lung cancer screening. You may have this screening every year starting at age 83 if you have a 30-pack-year history of smoking and currently smoke or have quit within the past 15 years. Fecal occult blood test (FOBT) of the stool. You may have this test every year starting at age 83. Flexible sigmoidoscopy or colonoscopy. You may have a sigmoidoscopy every 5 years or a colonoscopy every 10 years starting at age 83. Prostate cancer screening. Recommendations will vary depending on your family history and other risks. Hepatitis C blood test. Hepatitis B blood test. Sexually transmitted disease (STD) testing. Diabetes screening. This is done by checking your blood sugar (glucose) after you have not eaten for a while (fasting). You may have this done every 1-3 years. Abdominal aortic aneurysm (AAA) screening. You may need this if you are a current or former smoker. Osteoporosis. You may be screened starting at age 83 if you are at high risk. Talk with your health care provider about your test results, treatment options, and if necessary, the need for more tests. Vaccines  Your health care provider may recommend certain vaccines, such as: Influenza vaccine. This is recommended every year. Tetanus, diphtheria, and acellular pertussis (Tdap, Td) vaccine. You may need a Td booster every 10 years. Zoster vaccine. You may need this after age 83. Pneumococcal  13-valent conjugate (PCV13) vaccine. One dose is recommended after age 68. Pneumococcal polysaccharide (PPSV23) vaccine. One dose is recommended after age 69. Talk to your health care provider about which screenings and vaccines you need and how often you need them. This  information is not intended to replace advice given to you by your health care provider. Make sure you discuss any questions you have with your health care provider. Document Released: 07/03/2015 Document Revised: 02/24/2016 Document Reviewed: 04/07/2015 Elsevier Interactive Patient Education  2017 [employer].  Fall Prevention in the Home Falls can cause injuries. They can happen to people of all ages. There are many things you can do to make your home safe and to help prevent falls. What can I do on the outside of my home? Regularly fix the edges of walkways and driveways and fix any cracks. Remove anything that might make you trip as you walk through a door, such as a raised step or threshold. Trim any bushes or trees on the path to your home. Use bright outdoor lighting. Clear any walking paths of anything that might make someone trip, such as rocks or tools. Regularly check to see if handrails are loose or broken. Make sure that both sides of any steps have handrails. Any raised decks and porches should have guardrails on the edges. Have any leaves, snow, or ice cleared regularly. Use sand or salt on walking paths during winter. Clean up any spills in your garage right away. This includes oil or grease spills. What can I do in the bathroom? Use night lights. Install grab bars by the toilet and in the tub and shower. Do not use towel bars as grab bars. Use non-skid mats or decals in the tub or shower. If you need to sit down in the shower, use a plastic, non-slip stool. Keep the floor dry. Clean up any water that spills on the floor as soon as it happens. Remove soap buildup in the tub or shower regularly. Attach bath mats securely with double-sided non-slip rug tape. Do not have throw rugs and other things on the floor that can make you trip. What can I do in the bedroom? Use night lights. Make sure that you have a light by your bed that is easy to reach. Do not use any sheets or  blankets that are too big for your bed. They should not hang down onto the floor. Have a firm chair that has side arms. You can use this for support while you get dressed. Do not have throw rugs and other things on the floor that can make you trip. What can I do in the kitchen? Clean up any spills right away. Avoid walking on wet floors. Keep items that you use a lot in easy-to-reach places. If you need to reach something above you, use a strong step stool that has a grab bar. Keep electrical cords out of the way. Do not use floor polish or wax that makes floors slippery. If you must use wax, use non-skid floor wax. Do not have throw rugs and other things on the floor that can make you trip. What can I do with my stairs? Do not leave any items on the stairs. Make sure that there are handrails on both sides of the stairs and use them. Fix handrails that are broken or loose. Make sure that handrails are as long as the stairways. Check any carpeting to make sure that it is firmly attached to the stairs. Fix any carpet  that is loose or worn. Avoid having throw rugs at the top or bottom of the stairs. If you do have throw rugs, attach them to the floor with carpet tape. Make sure that you have a light switch at the top of the stairs and the bottom of the stairs. If you do not have them, ask someone to add them for you. What else can I do to help prevent falls? Wear shoes that: Do not have high heels. Have rubber bottoms. Are comfortable and fit you well. Are closed at the toe. Do not wear sandals. If you use a stepladder: Make sure that it is fully opened. Do not climb a closed stepladder. Make sure that both sides of the stepladder are locked into place. Ask someone to hold it for you, if possible. Clearly mark and make sure that you can see: Any grab bars or handrails. First and last steps. Where the edge of each step is. Use tools that help you move around (mobility aids) if they are  needed. These include: Canes. Walkers. Scooters. Crutches. Turn on the lights when you go into a dark area. Replace any light bulbs as soon as they burn out. Set up your furniture so you have a clear path. Avoid moving your furniture around. If any of your floors are uneven, fix them. If there are any pets around you, be aware of where they are. Review your medicines with your doctor. Some medicines can make you feel dizzy. This can increase your chance of falling. Ask your doctor what other things that you can do to help prevent falls. This information is not intended to replace advice given to you by your health care provider. Make sure you discuss any questions you have with your health care provider. Document Released: 04/02/2009 Document Revised: 11/12/2015 Document Reviewed: 07/11/2014 Elsevier Interactive Patient Education  2017 [employer].

## 2022-10-03 ENCOUNTER — Encounter: Payer: Self-pay | Admitting: *Deleted

## 2022-10-03 ENCOUNTER — Ambulatory Visit: Payer: Medicare Other | Admitting: Family Medicine

## 2022-10-04 ENCOUNTER — Ambulatory Visit: Payer: Medicare Other | Admitting: Family Medicine

## 2022-10-11 ENCOUNTER — Ambulatory Visit: Payer: Medicare Other | Admitting: Family Medicine

## 2022-10-11 DIAGNOSIS — F329 Major depressive disorder, single episode, unspecified: Secondary | ICD-10-CM | POA: Insufficient documentation

## 2022-10-11 DIAGNOSIS — M940 Chondrocostal junction syndrome [Tietze]: Secondary | ICD-10-CM | POA: Insufficient documentation

## 2022-10-12 NOTE — Progress Notes (Unsigned)
   Established Patient Office Visit  Subjective   Patient ID: James [name], male    DOB: 1940-08-01  Age: 82 y.o. MRN: [id number]  No chief complaint on file.   HPI  Patient is here for 57-month follow-up and chronic disease management.   Hyperlipidemia: - medications: atorvastatin 20 mg daily - compliance: *** - medication SEs: *** The ASCVD Risk score (Arnett DK, et al., 2019) failed to calculate for the following reasons:   The 2019 ASCVD risk score is only valid for ages 25 to 81  Hypothyroidism: - Management: -Taking medications as prescribed in the morning, apart from other foods, meds, vitamins, etc.  -No recent changes to hair, skin, nails, energy levels  Mood/Insomnia follow-up: - Diagnosis: anxiety, insomnia - Treatment: lorazepam 0.5 mg BID PRN, mirtazapine 15 mg bedtime, zolpidem 10 mg bedtime PRN - Medication side effects:  - SI/HI:  - Update:  Hypertension: - Medications: imdur 30 mg daily, metoprolol succinate 25 mg daily - Compliance: *** - Checking BP at home: *** - Denies any SOB, recurrent headaches, CP, vision changes, LE edema, dizziness, palpitations, or medication side effects. - Diet: *** - Exercise: *** - Stressors:  Vitamin D deficiency: - Treatment:   Vitamin B12 deficiency: - Treatment: PO 3000 units daily     {History (Optional):23778}  ROS    Objective:     There were no vitals taken for this visit. {Vitals History (Optional):23777}  Physical Exam   No results found for any visits on 10/13/22.  {Labs (Optional):23779}  The ASCVD Risk score (Arnett DK, et al., 2019) failed to calculate for the following reasons:   The 2019 ASCVD risk score is only valid for ages 78 to 3    Assessment & Plan:   Problem List Items Addressed This Visit   None   No follow-ups on file.    Clayborne Dana, NP

## 2022-10-13 ENCOUNTER — Ambulatory Visit (INDEPENDENT_AMBULATORY_CARE_PROVIDER_SITE_OTHER): Payer: Medicare Other | Admitting: Family Medicine

## 2022-10-13 ENCOUNTER — Ambulatory Visit: Payer: Medicare Other | Admitting: Family Medicine

## 2022-10-13 ENCOUNTER — Encounter: Payer: Self-pay | Admitting: Family Medicine

## 2022-10-13 VITALS — BP 94/58 | HR 62 | Ht 70.0 in | Wt 161.0 lb

## 2022-10-13 DIAGNOSIS — F411 Generalized anxiety disorder: Secondary | ICD-10-CM

## 2022-10-13 DIAGNOSIS — E538 Deficiency of other specified B group vitamins: Secondary | ICD-10-CM | POA: Diagnosis not present

## 2022-10-13 DIAGNOSIS — E559 Vitamin D deficiency, unspecified: Secondary | ICD-10-CM | POA: Diagnosis not present

## 2022-10-13 DIAGNOSIS — G4700 Insomnia, unspecified: Secondary | ICD-10-CM | POA: Diagnosis not present

## 2022-10-13 DIAGNOSIS — E039 Hypothyroidism, unspecified: Secondary | ICD-10-CM

## 2022-10-13 DIAGNOSIS — E785 Hyperlipidemia, unspecified: Secondary | ICD-10-CM | POA: Diagnosis not present

## 2022-10-13 DIAGNOSIS — I10 Essential (primary) hypertension: Secondary | ICD-10-CM | POA: Diagnosis not present

## 2022-10-13 NOTE — Assessment & Plan Note (Signed)
Doing well on current regimen. Discussed safety.  Contract updated today

## 2022-10-13 NOTE — Assessment & Plan Note (Signed)
Doing well with nightly remeron and ambien, PRN lorazepam Contract today No refills needed yet Discussed safety

## 2022-10-13 NOTE — Assessment & Plan Note (Signed)
Medication management: atorvastatin 20 mg daily  Lifestyle factors for lowering cholesterol include: Diet therapy - heart-healthy diet rich in fruits, veggies, fiber-rich whole grains, lean meats, chicken, fish (at least twice a week), fat-free or 1% dairy products; foods low in saturated/trans fats, cholesterol, sodium, and sugar. Mediterranean diet has shown to be very heart healthy. Regular exercise - recommend at least 30 minutes a day, 5 times per week Weight management  Repeat CMP and lipid panel today

## 2022-10-13 NOTE — Patient Instructions (Signed)
Schedule future lab appointment - no Biotin for 3 days prior to lab draw.

## 2022-10-13 NOTE — Assessment & Plan Note (Signed)
No acute concerns today.  Only taking 12.5 metoprolol, but BP is soft today. Recommend he start monitoring BP and if consistently low we need to discuss adjust medications.  Continue healthy diet and regular exercise

## 2022-10-13 NOTE — Assessment & Plan Note (Signed)
Previously well controlled Continue Synthroid at current dose  Recheck TSH and adjust Synthroid as indicated   

## 2022-10-17 ENCOUNTER — Other Ambulatory Visit (INDEPENDENT_AMBULATORY_CARE_PROVIDER_SITE_OTHER): Payer: Medicare Other

## 2022-10-17 DIAGNOSIS — I10 Essential (primary) hypertension: Secondary | ICD-10-CM | POA: Diagnosis not present

## 2022-10-17 DIAGNOSIS — E785 Hyperlipidemia, unspecified: Secondary | ICD-10-CM | POA: Diagnosis not present

## 2022-10-17 DIAGNOSIS — E559 Vitamin D deficiency, unspecified: Secondary | ICD-10-CM

## 2022-10-17 DIAGNOSIS — E039 Hypothyroidism, unspecified: Secondary | ICD-10-CM

## 2022-10-17 DIAGNOSIS — E538 Deficiency of other specified B group vitamins: Secondary | ICD-10-CM

## 2022-10-17 LAB — CBC WITH DIFFERENTIAL/PLATELET
Basophils Absolute: 0 10*3/uL (ref 0.0–0.1)
Basophils Relative: 0.3 % (ref 0.0–3.0)
Eosinophils Absolute: 0.2 10*3/uL (ref 0.0–0.7)
Eosinophils Relative: 2.2 % (ref 0.0–5.0)
HCT: 41.4 % (ref 39.0–52.0)
Hemoglobin: 14.5 g/dL (ref 13.0–17.0)
Lymphocytes Relative: 42.1 % (ref 12.0–46.0)
Lymphs Abs: 3 10*3/uL (ref 0.7–4.0)
MCHC: 35.1 g/dL (ref 30.0–36.0)
MCV: 93.9 fl (ref 78.0–100.0)
Monocytes Absolute: 0.7 10*3/uL (ref 0.1–1.0)
Monocytes Relative: 10.1 % (ref 3.0–12.0)
Neutro Abs: 3.2 10*3/uL (ref 1.4–7.7)
Neutrophils Relative %: 45.3 % (ref 43.0–77.0)
Platelets: 221 10*3/uL (ref 150.0–400.0)
RBC: 4.41 Mil/uL (ref 4.22–5.81)
RDW: 12.9 % (ref 11.5–15.5)
WBC: 7.1 10*3/uL (ref 4.0–10.5)

## 2022-10-17 LAB — COMPREHENSIVE METABOLIC PANEL
ALT: 18 U/L (ref 0–53)
AST: 21 U/L (ref 0–37)
Albumin: 4 g/dL (ref 3.5–5.2)
Alkaline Phosphatase: 55 U/L (ref 39–117)
BUN: 9 mg/dL (ref 6–23)
CO2: 29 mEq/L (ref 19–32)
Calcium: 9.1 mg/dL (ref 8.4–10.5)
Chloride: 98 mEq/L (ref 96–112)
Creatinine, Ser: 0.88 mg/dL (ref 0.40–1.50)
GFR: 79.88 mL/min (ref >60.00)
Glucose, Bld: 95 mg/dL (ref 70–99)
Potassium: 4.5 mEq/L (ref 3.5–5.1)
Sodium: 135 mEq/L (ref 135–145)
Total Bilirubin: 1 mg/dL (ref 0.2–1.2)
Total Protein: 6.7 g/dL (ref 6.0–8.3)

## 2022-10-17 LAB — LIPID PANEL
Cholesterol: 118 mg/dL (ref 0–200)
HDL: 55.7 mg/dL (ref >39.00)
LDL Cholesterol: 50 mg/dL (ref 0–99)
NonHDL: 62.43
Total CHOL/HDL Ratio: 2
Triglycerides: 62 mg/dL (ref 0.0–149.0)
VLDL: 12.4 mg/dL (ref 0.0–40.0)

## 2022-10-17 LAB — VITAMIN B12: Vitamin B-12: 719 pg/mL (ref 211–911)

## 2022-10-17 LAB — VITAMIN D 25 HYDROXY (VIT D DEFICIENCY, FRACTURES): VITD: 41.33 ng/mL (ref 30.00–100.00)

## 2022-10-17 LAB — TSH: TSH: 1 u[IU]/mL (ref 0.35–5.50)

## 2022-10-25 ENCOUNTER — Other Ambulatory Visit: Payer: Self-pay | Admitting: Family Medicine

## 2022-10-25 NOTE — Telephone Encounter (Signed)
Requesting: Ambien 10mg   Contract: 09/03/21 UDS: 01/11/21 Last Visit: 10/13/22 w/ Ladona Ridgel Next Visit: None Last Refill: 09/01/22 #30 and 1RF  Please Advise

## 2022-10-26 ENCOUNTER — Ambulatory Visit (HOSPITAL_COMMUNITY): Payer: Medicare Other | Attending: Cardiology

## 2022-10-26 DIAGNOSIS — I517 Cardiomegaly: Secondary | ICD-10-CM | POA: Diagnosis not present

## 2022-10-26 DIAGNOSIS — I714 Abdominal aortic aneurysm, without rupture, unspecified: Secondary | ICD-10-CM | POA: Insufficient documentation

## 2022-10-26 DIAGNOSIS — I340 Nonrheumatic mitral (valve) insufficiency: Secondary | ICD-10-CM

## 2022-10-26 DIAGNOSIS — E785 Hyperlipidemia, unspecified: Secondary | ICD-10-CM | POA: Insufficient documentation

## 2022-10-26 DIAGNOSIS — I4891 Unspecified atrial fibrillation: Secondary | ICD-10-CM | POA: Diagnosis not present

## 2022-10-26 DIAGNOSIS — I77810 Thoracic aortic ectasia: Secondary | ICD-10-CM

## 2022-10-26 DIAGNOSIS — I351 Nonrheumatic aortic (valve) insufficiency: Secondary | ICD-10-CM | POA: Diagnosis not present

## 2022-10-26 DIAGNOSIS — I77819 Aortic ectasia, unspecified site: Secondary | ICD-10-CM

## 2022-10-26 DIAGNOSIS — I2510 Atherosclerotic heart disease of native coronary artery without angina pectoris: Secondary | ICD-10-CM | POA: Insufficient documentation

## 2022-10-26 DIAGNOSIS — R55 Syncope and collapse: Secondary | ICD-10-CM | POA: Diagnosis not present

## 2022-10-26 LAB — ECHOCARDIOGRAM COMPLETE
Area-P 1/2: 1.89 cm2
P 1/2 time: 564 msec
S' Lateral: 3 cm

## 2022-10-27 ENCOUNTER — Telehealth: Payer: Self-pay | Admitting: *Deleted

## 2022-10-27 DIAGNOSIS — I351 Nonrheumatic aortic (valve) insufficiency: Secondary | ICD-10-CM

## 2022-10-27 DIAGNOSIS — I77819 Aortic ectasia, unspecified site: Secondary | ICD-10-CM

## 2022-10-27 NOTE — Telephone Encounter (Signed)
-----   Message from Meriam Sprague, MD sent at 10/26/2022  8:01 PM EDT ----- His echo shows normal pumping function. 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 This is stable from prior and we will continue with yearly echoes.

## 2022-10-27 NOTE — Telephone Encounter (Signed)
The patient has been notified of the result and verbalized understanding.  All questions (if any) were answered.  Pt aware that we will repeat another echo on him in one year for surveillance.    He is aware that I will place the order in the system and send a message to our Echo Scheduler to call him back to arrange this appt.   Pt verbalized understanding and agrees with this plan.

## 2022-10-28 ENCOUNTER — Other Ambulatory Visit: Payer: Self-pay | Admitting: Family Medicine

## 2022-10-28 NOTE — Telephone Encounter (Signed)
Requesting: lorazepam 0.5mg   Contract: 01/11/21 UDS: 01/11/21 Last Visit: 10/13/22 w/ Ladona Ridgel Next Visit: None Last Refill: 01/26/22 #30 and 1RF  Please Advise

## 2022-10-31 ENCOUNTER — Encounter (HOSPITAL_COMMUNITY): Payer: Self-pay

## 2022-11-03 ENCOUNTER — Other Ambulatory Visit: Payer: Self-pay | Admitting: Family Medicine

## 2022-11-03 DIAGNOSIS — I10 Essential (primary) hypertension: Secondary | ICD-10-CM

## 2022-11-03 MED ORDER — TESTOSTERONE 30 MG/ACT TD SOLN
1.0000 | Freq: Every day | TRANSDERMAL | 1 refills | Status: DC
Start: 2022-11-03 — End: 2022-11-18

## 2022-11-04 ENCOUNTER — Telehealth: Payer: Self-pay

## 2022-11-04 NOTE — Telephone Encounter (Signed)
[facility] called regarding Rx [facility] Called [facility] to them know [facility]. Pt said to leave it at [facility] for now .

## 2022-11-04 NOTE — Telephone Encounter (Signed)
PA initiated via Covermymeds; KEY: [id number]. Awaiting determination.

## 2022-11-04 NOTE — Telephone Encounter (Signed)
PA approved.   Your request has been approved Authorization Expiration Date: 11/04/2023

## 2022-11-04 NOTE — Telephone Encounter (Signed)
Patient called to see if I could assist with getting his testosterone refills. It appears that [facility] but all his other meds at [facility]. Rx for testosterone was sent to [facility].  [facility] and [facility] for them to transfer from [facility].

## 2022-11-18 ENCOUNTER — Telehealth: Payer: Self-pay | Admitting: Pharmacist

## 2022-11-18 ENCOUNTER — Telehealth: Payer: Self-pay | Admitting: Family Medicine

## 2022-11-18 ENCOUNTER — Other Ambulatory Visit: Payer: Self-pay | Admitting: Family Medicine

## 2022-11-18 DIAGNOSIS — I10 Essential (primary) hypertension: Secondary | ICD-10-CM

## 2022-11-18 MED ORDER — TESTOSTERONE 30 MG/ACT TD SOLN
2.0000 | Freq: Every day | TRANSDERMAL | 1 refills | Status: DC
Start: 2022-11-18 — End: 2023-04-07

## 2022-11-18 NOTE — Telephone Encounter (Signed)
PT called to get clarification on his testosterone prescription. He said the instructions say to use one pump topically for 5 days but he said in the past it has been one pump per arm and the current prescription as written will not last. Please call pt to advise

## 2022-11-18 NOTE — Telephone Encounter (Signed)
Patient called with questions about recent testosterone 30mg  / actuation prescription and dose. This is generic for Axiron. Patient states he has been using one pump under each arm but the most recent directions is just 1 pump daily.  He had to have filled at [facility] because [facility] and 1 bottle was filled as 84 days supply but per patient it will not last that long due to dose.   Will check with PCP to make sure she does what him to continue 1 pump under each arm. [facility] due to [facility]. Will need [facility] and cancel any refills at [facility].  Patient does has 1 bottle of testosterone 30mg 

## 2022-11-21 NOTE — Telephone Encounter (Signed)
Called pt was advised the testosterone prescription was sent  On 11/18/22.

## 2022-11-26 ENCOUNTER — Other Ambulatory Visit: Payer: Self-pay | Admitting: Family Medicine

## 2022-12-03 ENCOUNTER — Other Ambulatory Visit: Payer: Self-pay | Admitting: Family Medicine

## 2022-12-05 ENCOUNTER — Other Ambulatory Visit: Payer: Self-pay | Admitting: Family Medicine

## 2022-12-09 ENCOUNTER — Telehealth: Payer: Self-pay | Admitting: Cardiology

## 2022-12-09 NOTE — Telephone Encounter (Signed)
Pt c/o of Chest Pain: STAT if CP now or developed within 24 hours  1. Are you having CP right now? Some chest discomfort  2. Are you experiencing any other symptoms (ex. SOB, nausea, vomiting, sweating)? Lightheadedness and weakness  3. How long have you been experiencing CP? For about a week   4. Is your CP continuous or coming and going? Chest discomfort comes and goes.   5. Have you taken Nitroglycerin? no ?

## 2022-12-09 NOTE — Telephone Encounter (Signed)
Pt is calling in with complaints of feeling lightheaded/dizziness/chest discomfort for the last week.  These are intermittent episodes.  Pt states he notices these more when goes out to do his daily power walk.  Pt states he is very active.  Pt states for the last week he has had mild chest discomfort, weakness, lightheadedness that is intermittent in nature.  He states the chest discomfort is mid-sternal and feels more like mild pressure than sharp pain.  He states this does not radiate.  He states he feels weak at times, and has to sit to help with this.  He also states he gets mildly sob when the episodes occur.  Pt denies palpitations, fast heart rate, fluttering, N/V, diaphoresis, orthopnea, pre-syncopal or syncopal episodes.   He states his heart rates are staying regular and running in the 60s.    He states he has been monitoring his pressures and they're staying baseline in the low 100s systolic 50s-60s diastolic.  Last BP taken this morning was 108/59.   He denies having active chest pain at this current time.  He reiterated these episodes are intermittent in nature.  He states it is making him anxious when they occur.   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   He denies any lower extremity edema.   He is leaving for [place] with his family today to go to a concert, and states he feels well enough to attend this function.  He will be back on Sunday.  Pt is asking for an appt next week for further evaluation of this.   Scheduled the pt to see the DOD Dr. Lynnette Caffey on next Monday 6/24 at 3:30 pm.  Advised him to arrive 15 mins prior to this appt.   Advised him that in the interim between now and his appt next Monday, if his symptoms return/worsen/persist, then he should seek immediate medical attention.   Advised him on his car ride to [place], he should get up and move around about every hour, wear compressions, stay out of the heat/sun, avoid exerting  himself or power walking until seen next week and otherwise advised differently.  ED precautions provided.   Advised him to continue monitoring his HR/BP.   Informed the pt that Dr. Shari Prows is out of the office at this time, but I will route this message to her to make her aware of this plan.  Pt verbalized understanding and agrees with this plan.

## 2022-12-09 NOTE — Telephone Encounter (Signed)
[place], [place] - 12/09/2022 10:19 AM Meriam Sprague, MD  Sent: Caleen Essex December 09, 2022  2:24 PM  To: Loa Socks, LPN         Message  Thank you so much!

## 2022-12-12 ENCOUNTER — Ambulatory Visit: Payer: Medicare Other | Attending: Internal Medicine | Admitting: Internal Medicine

## 2022-12-12 ENCOUNTER — Encounter: Payer: Self-pay | Admitting: Internal Medicine

## 2022-12-12 ENCOUNTER — Ambulatory Visit (INDEPENDENT_AMBULATORY_CARE_PROVIDER_SITE_OTHER): Payer: Medicare Other

## 2022-12-12 VITALS — BP 120/70 | HR 58 | Ht 70.0 in | Wt 161.2 lb

## 2022-12-12 DIAGNOSIS — R42 Dizziness and giddiness: Secondary | ICD-10-CM

## 2022-12-12 DIAGNOSIS — R072 Precordial pain: Secondary | ICD-10-CM

## 2022-12-12 DIAGNOSIS — I2510 Atherosclerotic heart disease of native coronary artery without angina pectoris: Secondary | ICD-10-CM | POA: Diagnosis not present

## 2022-12-12 DIAGNOSIS — E785 Hyperlipidemia, unspecified: Secondary | ICD-10-CM | POA: Insufficient documentation

## 2022-12-12 DIAGNOSIS — I77819 Aortic ectasia, unspecified site: Secondary | ICD-10-CM | POA: Diagnosis not present

## 2022-12-12 MED ORDER — ISOSORBIDE MONONITRATE ER 30 MG PO TB24: 30.0000 mg | ORAL_TABLET | Freq: Every day | ORAL | 3 refills | Status: DC

## 2022-12-12 NOTE — Telephone Encounter (Signed)
Pt saw the DOD today for complaints. Please refer to office visit note from today for further details.

## 2022-12-12 NOTE — Progress Notes (Signed)
Cardiology Office Note:    Date:  12/12/2022   ID:  James [name], DOB 05-Nov-1940, MRN [id number]  PCP:  Bradd Canary, MD   [facility] Providers Cardiologist:  Alverda Skeans, MD Referring MD: Bradd Canary, MD   Chief Complaint/Reason for Referral: DOD appointment for chest discomfort and lightheadedness  ASSESSMENT:    1. Lightheadedness   2. Precordial pain   3. Dilation of aorta (HCC)   4. Hyperlipidemia LDL goal <70   5. Coronary artery disease involving native coronary artery of native heart without angina pectoris     PLAN:    In order of problems listed above: 1.  Presyncope: Will obtain a monitor to evaluate further.  He had an echo recently which was reassuring.  If the monitor is reassuring I told him that he needs to remain well-hydrated and to eat something prior to exercise. 2.  Chest pain: The patient has had an extensive evaluation which has been negative.  EKG demonstrates some nonspecific abnormalities but no acute ischemic changes.  Will refer for PET stress test to evaluate for microvascular dysfunction.  The development of the ST depressions on exercise treadmill stress test in conjunction with the absence of obstructive coronary artery disease on cardiac catheterization suggests the possibility of microvascular dysfunction.  If PET stress test is not consistent with microvascular dysfunction, then empiric therapy geared towards vasospasm (CCBs) may be a reasonable approach.  Interestingly his chest pain symptoms did start after he stopped Imdur.  Will restart Imdur 30 mg.  This may suggest a vasospastic component to his chest pain syndrome. 3.  Aortic dilatation: Being followed by Dr. Shari Prows. 4.  Hyperlipidemia: LDL in April was 50. 5.  Coronary artery disease: Very mild on coronary angiography study.          Informed Consent   Shared Decision Making/Informed Consent The risks [chest pain, shortness of breath, cardiac arrhythmias, dizziness,  blood pressure fluctuations, myocardial infarction, stroke/transient ischemic attack, nausea, vomiting, allergic reaction, radiation exposure, metallic taste sensation and life-threatening complications (estimated to be 1 in 10,000)], benefits (risk stratification, diagnosing coronary artery disease, treatment guidance) and alternatives of a cardiac PET stress test were discussed in detail with [name] and he agrees to proceed.      Dispo:  Return in about 3 months (around 03/14/2023).      Medication Adjustments/Labs and Tests Ordered: Current medicines are reviewed at length with the patient today.  Concerns regarding medicines are outlined above.  The following changes have been made:     Labs/tests ordered: Orders Placed This Encounter  Procedures   NM PET CT CARDIAC PERFUSION MULTI W/ABSOLUTE BLOODFLOW   LONG TERM MONITOR (3-14 DAYS)   EKG 12-Lead    Medication Changes: Meds ordered this encounter  Medications   isosorbide mononitrate (IMDUR) 30 MG 24 hr tablet    Sig: Take 1 tablet (30 mg total) by mouth daily.    Dispense:  90 tablet    Refill:  3     Current medicines are reviewed at length with the patient today.  The patient does not have concerns regarding medicines.   History of Present Illness:    FOCUSED PROBLEM LIST:   1.  Hyperlipidemia 2.  Mild nonobstructive coronary artery disease on cardiac catheterization 2023 4.  Ascending aortic dilatation 5.  Mild carotid artery disease   The patient is a 82 y.o. male with the indicated medical history here for that I did doctor of the day appointment.  The patient is normally seen by Dr. Shari Prows.  The patient has a long history of lightheadedness with intermittent chest discomfort.  He has undergone a number of cardiac procedures most notably coronary angiography last year which demonstrated mild disease, cardiac MRI which showed normal function with borderline RV systolic function, and exercise treadmill stress  test which showed atrial tachycardia, cardiac monitor which demonstrated occasional PACs and PVCs supraventricular tachycardia, and echocardiography which confirmed normal LV function with only mild aortic insufficiency.  He was seen by Dr. Shari Prows in October 2023 and was doing fairly well.  His metoprolol 12.5 mg twice daily was continued.  His Imdur was stopped.  He had seen EP for recommendations and they suggested continue medical therapy.  The patient is a competitive speed walker.  He tells me that on occasion he will develop some chest pain when he starts walking and will have to slow down a bit.  Chest pain will improve.  He will complete his walk.  After he stops he will get chest pain and lightheaded at times.  He occasionally gets chest pain at rest on occasion as well.  He tells me that sometimes when he gets lightheaded he has not eaten anything prior to doing his 2 mile walks.  He fortunately has not had any frank syncope.       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   Benign paroxysmal positional vertigo 04/06/2013   CAD (coronary artery disease)    nonobstructive by cath 2008; nonobstructive by Cardiac CT 11/2011   Cervical spine fracture (HCC) 1980   C6-7   Diverticulosis    Erectile dysfunction 09/08/2013   GERD (gastroesophageal reflux disease)    Hiatal hernia    History of atrial fibrillation    History of squamous cell carcinoma 10-05-10   Dr Cristopher Estimable, [place]   HLD (hyperlipidemia)    Hypothyroidism    Medicare annual wellness visit, subsequent 03/16/2014   Sees [facility] Follows with Dr Eden Emms of cardiology Sees [facility]es [facility] for colonoscopy, last colonoscopy in 2013    Mitral valve prolapse    Neck pain 05/03/2015    Preventative health care 03/16/2014   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   atorvastatin (LIPITOR) 20 MG tablet TAKE 1 TABLET BY MOUTH EVERY DAY FOR CHOLESTEROL   Calcium Carb-Cholecalciferol (CALCIUM 1000 + D PO) Take 1 tablet by mouth 3 (three) times a week.    COLLAGEN PO Take 1 capsule by mouth daily. (Chewable - has hyaluronic acid, collagen and biotin)   Cyanocobalamin (VITAMIN B 12 PO) Take 3,000 mcg by mouth daily.   diclofenac Sodium (VOLTAREN) 1 % GEL Apply 4 g topically 4 (four) times daily. To affected joint.   Dutasteride-Tamsulosin HCl 0.5-0.4 MG CAPS TAKE 1 CAPSULE BY MOUTH AT BEDTIME   ELDERBERRY PO Take 50 mg by mouth. 4 or 5 times per week   isosorbide mononitrate (IMDUR) 30 MG 24 hr tablet Take 1 tablet (30 mg total) by mouth daily.   levothyroxine (SYNTHROID) 100 MCG tablet Take 1 tablet (100 mcg total) by  mouth daily before breakfast.   LORazepam (ATIVAN) 0.5 MG tablet TAKE 1 TABLET BY MOUTH TWICE DAILY AS NEEDED FOR ANXIETY   Melatonin 10 MG TABS Take 10 mg by mouth at bedtime.   metoprolol succinate (TOPROL XL) 25 MG 24 hr tablet Take 0.5 tablets (12.5 mg total) by mouth at bedtime.   Milk Thistle 1000 MG CAPS Take 1,000 mg by mouth in the morning.   mirtazapine (REMERON) 15 MG tablet TAKE 1 TABLET BY MOUTH AT BEDTIME   Multiple Vitamins-Minerals (MULTIVITAMINS THER. W/MINERALS) TABS Take 1 tablet by mouth daily.   pantoprazole (PROTONIX) 40 MG tablet TAKE 1 TABLET BY MOUTH 2 TIMES A DAY BEFORE MEALS FOR ACID REFLUX   Probiotic Product (PROBIOTIC DAILY PO) Take 1 tablet by mouth daily.   sildenafil (REVATIO) 20 MG tablet TAKE 1 TO 5 TABLETS BY MOUTH EVERY DAY AS NEEDED   Testosterone 30 MG/ACT SOLN Apply 2 Pump topically daily.   Turmeric (QC TUMERIC COMPLEX) 500 MG CAPS Take 1 tablet by  mouth daily.   zolpidem (AMBIEN) 10 MG tablet TAKE 1 TABLET BY MOUTH AT BEDTIME AS NEEDED FOR SLEEP     Allergies:    Sulfa antibiotics   Social History:   Social History   Tobacco Use   Smoking status: Never   Smokeless tobacco: Never  Vaping Use   Vaping Use: Never used  Substance Use Topics   Alcohol use: Yes    Comment: 2 glasses red wine/day   Drug use: No     Family Hx: Family History  Problem Relation Age of Onset   Heart disease Mother        MI 74s   Stroke Mother    Hypertension Mother    Heart disease Maternal Grandfather        MI 37-80s   Heart disease Maternal Grandmother        MI 39-80s   Lung disease Father        penumonitis   Cancer Sister        colon   Cancer Paternal Uncle        GI: colon he thinks     Review of Systems:   Please see the history of present illness.    All other systems reviewed and are negative.     EKGs/Labs/Other Test Reviewed:    EKG:    EKG Interpretation  Date/Time:  Monday December 12 2022 15:37:01 EDT Ventricular Rate:  54 PR Interval:  150 QRS Duration: 106 QT Interval:  438 QTC Calculation: 415 R Axis:   58 Text Interpretation: Sinus bradycardia When compared with ECG of 01-Oct-2021 07:34, Nonspecific T wave abnormality now evident in Inferior leads Nonspecific T wave abnormality now evident in Lateral leads Confirmed by Alverda Skeans ([id number]) on 12/12/2022 3:38:03 PM         Prior CV studies:  Cardiac Studies & Procedures   CARDIAC CATHETERIZATION  CARDIAC CATHETERIZATION 10/01/2021  Narrative CONCLUSIONS: Angiographically normal coronary arteries Right dominant anatomy Normal LV function with normal LVEDP.  EF 55%. Dilated aorta  Recommendations:  Ischemic EKG changes could be accounted for by decreased coronary vasodilator reserve/microvascular dysfunction in this elderly gentleman. Dilated aorta might cause consideration of aortic imaging to rule out aneurysm.  Findings Coronary  Findings Diagnostic  Dominance: Right  No diagnostic findings have been documented. Intervention  No interventions have been documented.   STRESS TESTS  EXERCISE TOLERANCE TEST (ETT) 09/30/2021  Narrative   2.0 mm of horizontal ST depression (  V4, V5 and V6) was noted.  Abnormal ECG treadmill stress test due to ischemic ST changes at peak exercise. Also noted is a marked increase in atrial arrhythmia with runs of nonsustained atrial tachycardia at peak exercise and during recovery. Cannot exclude brief atrial fibrillation at peak exercise.   ECHOCARDIOGRAM  ECHOCARDIOGRAM COMPLETE 10/26/2022  Narrative ECHOCARDIOGRAM REPORT    Patient Name:   James [name] [facility] Date of Exam: 10/26/2022 Medical Rec #:  [id number]     Height:       70.0 in Accession #:    [id number]    Weight:       161.0 lb Date of Birth:  31-May-1941     BSA:          1.903 m Patient Age:    81 years      BP:           109/67 mmHg Patient Gender: M             HR:           50 bpm. Exam Location:  [address]  Procedure: 2D Echo, 3D Echo, Cardiac Doppler and Color Doppler  Indications:    I77.819 Dilation of Aorta  History:        Patient has prior history of Echocardiogram examinations, most recent 04/01/2020. CAD, AAA, Mitral Valve Prolapse, Arrythmias:Atrial Fibrillation, Signs/Symptoms:Dizziness/Lightheadedness and Syncope; Risk Factors:HLD.  Sonographer:    Clearence Ped RCS Referring Phys: [id number] HEATHER E [place]  IMPRESSIONS   1. Left ventricular ejection fraction, by estimation, is 55 to 60%. The left ventricle has normal function. The left ventricle has no regional wall motion abnormalities. There is mild left ventricular hypertrophy. Left ventricular diastolic parameters are consistent with Grade I diastolic dysfunction (impaired relaxation). 2. Right ventricular systolic function is normal. The right ventricular size is normal. There is normal pulmonary artery systolic pressure. The estimated  right ventricular systolic pressure is 24.9 mmHg. 3. The mitral valve is normal in structure. Trivial mitral valve regurgitation. No evidence of mitral stenosis. 4. The aortic valve is tricuspid. Aortic valve regurgitation is mild. No aortic stenosis is present. 5. Aortic dilatation noted. There is dilatation of the ascending aorta, measuring 41 mm. 6. The inferior vena cava is normal in size with greater than 50% respiratory variability, suggesting right atrial pressure of 3 mmHg.  FINDINGS Left Ventricle: Left ventricular ejection fraction, by estimation, is 55 to 60%. The left ventricle has normal function. The left ventricle has no regional wall motion abnormalities. 3D ejection fraction reviewed and evaluated as part of the interpretation. Alternate measurement of EF is felt to be most reflective of LV function. The left ventricular internal cavity size was normal in size. There is mild left ventricular hypertrophy. Left ventricular diastolic parameters are consistent with Grade I diastolic dysfunction (impaired relaxation).  Right Ventricle: The right ventricular size is normal. No increase in right ventricular wall thickness. Right ventricular systolic function is normal. There is normal pulmonary artery systolic pressure. The tricuspid regurgitant velocity is 2.34 m/s, and with an assumed right atrial pressure of 3 mmHg, the estimated right ventricular systolic pressure is 24.9 mmHg.  Left Atrium: Left atrial size was normal in size.  Right Atrium: Right atrial size was normal in size.  Pericardium: There is no evidence of pericardial effusion.  Mitral Valve: The mitral valve is normal in structure. Trivial mitral valve regurgitation. No evidence of mitral valve stenosis.  Tricuspid Valve: The tricuspid valve is normal in structure. Tricuspid valve  regurgitation is mild.  Aortic Valve: The aortic valve is tricuspid. Aortic valve regurgitation is mild. Aortic regurgitation PHT measures  564 msec. No aortic stenosis is present.  Pulmonic Valve: The pulmonic valve was not well visualized. Pulmonic valve regurgitation is not visualized.  Aorta: The aortic root is normal in size and structure and aortic dilatation noted. There is dilatation of the ascending aorta, measuring 41 mm.  Venous: The inferior vena cava is normal in size with greater than 50% respiratory variability, suggesting right atrial pressure of 3 mmHg.  IAS/Shunts: The interatrial septum was not well visualized.   LEFT VENTRICLE PLAX 2D LVIDd:         4.30 cm   Diastology LVIDs:         3.00 cm   LV e' medial:    5.44 cm/s LV PW:         1.10 cm   LV E/e' medial:  8.1 LV IVS:        1.10 cm   LV e' lateral:   10.20 cm/s LVOT diam:     2.00 cm   LV E/e' lateral: 4.3 LV SV:         69 LV SV Index:   36 LVOT Area:     3.14 cm  3D Volume EF: 3D EF:        53 % LV EDV:       135 ml LV ESV:       64 ml LV SV:        71 ml  RIGHT VENTRICLE RV Basal diam:  4.00 cm RV Mid diam:    3.70 cm RV S prime:     16.80 cm/s TAPSE (M-mode): 2.4 cm RVSP:           24.9 mmHg  LEFT ATRIUM             Index        RIGHT ATRIUM           Index LA diam:        3.30 cm 1.73 cm/m   RA Pressure: 3.00 mmHg LA Vol (A2C):   50.5 ml 26.53 ml/m  RA Area:     15.30 cm LA Vol (A4C):   52.3 ml 27.48 ml/m  RA Volume:   41.80 ml  21.96 ml/m LA Biplane Vol: 54.0 ml 28.37 ml/m AORTIC VALVE LVOT Vmax:   118.00 cm/s LVOT Vmean:  75.400 cm/s LVOT VTI:    0.220 m AI PHT:      564 msec  AORTA Ao Root diam: 3.30 cm Ao Asc diam:  4.10 cm  MITRAL VALVE               TRICUSPID VALVE MV Area (PHT):             TR Peak grad:   21.9 mmHg MV Decel Time:             TR Vmax:        234.00 cm/s MV E velocity: 44.00 cm/s  Estimated RAP:  3.00 mmHg MV A velocity: 54.00 cm/s  RVSP:           24.9 mmHg MV E/A ratio:  0.81 SHUNTS Systemic VTI:  0.22 m Systemic Diam: 2.00 cm  Epifanio Lesches MD Electronically signed by  Epifanio Lesches MD Signature Date/Time: 10/26/2022/7:09:27 PM    Final    MONITORS  LONG TERM MONITOR (3-14 DAYS) 10/19/2021  Narrative  Patch wear time was 2  days and 22 hours  Predominant rhythm was NSR with average HR 51bpm (36-133bpm)  7 runs of nonsustained SVT with longest lasting 11 beats  Occasional SVE (1.1%), rare VE (<1%)  Patient triggered events correlate with SVE and PVCs  No sustained arrhythmias or significant pauses   Patch Wear Time:  2 days and 22 hours (2023-04-22T16:27:26-0400 to 2023-04-25T14:58:45-0400)  Patient had a min HR of 36 bpm, max HR of 133 bpm, and avg HR of 51 bpm. Predominant underlying rhythm was Sinus Rhythm. 7 Supraventricular Tachycardia runs occurred, the run with the fastest interval lasting 4 beats with a max rate of 133 bpm, the longest lasting 11 beats with an avg rate of 96 bpm. Isolated SVEs were occasional (1.1%, 2430), SVE Couplets were rare (<1.0%, 462), and SVE Triplets were rare (<1.0%, 81). Isolated VEs were rare (<1.0%, 1205), VE Couplets were rare (<1.0%, 8), and VE Triplets were rare (<1.0%, 2).  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 A dedicated cardiac coil was used. Functional imaging was done using Fiesta sequences. 2,3, and 4 chamber views were done to assess for RWMA's. Modified Simpson's rule using a short axis stack was used to calculate an ejection fraction on a dedicated work [employer]. The patient received 6.50mL GADAVIST GADOBUTROL 1 MMOL/ML IV SOLN. After 10 minutes inversion recovery sequences were used to assess for infiltration and scar tissue.  This examination is tailored for evaluation cardiac anatomy and function and provides very limited assessment of  noncardiac structures, which are accordingly not evaluated during interpretation. If there is clinical concern for extracardiac pathology, further evaluation with CT imaging should be considered.  FINDINGS: LEFT VENTRICLE:  Normal left ventricular chamber size.  Normal left ventricular wall thickness.  Normal left ventricular systolic function.  LVEF = 55%  There are no regional wall motion abnormalities.  No myocardial edema, T2 = 50 ms  First pass perfusion not performed.  There is no post contrast delayed myocardial enhancement in the left ventricle.  Normal T1 myocardial nulling kinetics suggest against a diagnosis of cardiac amyloidosis.  ECV = 27%, normal range.  RIGHT VENTRICLE:  Normal right ventricular chamber size.  Normal right ventricular wall thickness.  Borderline reduced right ventricular systolic function.  RVEF = 45%  There are no regional wall motion abnormalities. Borderline global hypokinesis, function best preserved at the base.  There is probable post contrast delayed myocardial enhancement in the RV papillary muscle, likely the anterior papillary muscle.  ATRIA:  Mild biatrial chamber enlargement.  VALVES:  Tricuspid aortic valve. Mild aortic valve regurgitation, regurgitant fraction 11.3%.  AORTA:  Mild dilation of ascending aorta, approximately 41 mm in axial plane.  PERICARDIUM:  Normal pericardium.  Trivial pericardial effusion.  OTHER: No significant extracardiac findings.  MEASUREMENTS: Left ventricle:  LV male  LV EF: 55% (normal 56-78%)  Absolute volumes:  LV EDV: 150 mL (Normal 77-195 mL)  LV ESV: 67 mL (Normal 19-72 mL)  LV SV: 83 mL (Normal 51-133 mL)  CO: 4.0 L/min (Normal 2.8-8.8 L/min)  Indexed volumes:  LV EDV: 82 mL/sq-m (Normal 47-92 mL/sq-m)  LV ESV: 36 mL/sq-m (Normal 13-30 mL/sq-m)  LV SV: 45 mL/sq-m (Normal 32-62 mL/sq-m)  CI: 2.15 L/min/sq-m (Normal 1.7-4.2 L/min/sq-m)  Right  ventricle:  RV male  RV EF: 45 % (  Normal 47-74%)  Absolute volumes:  RV EDV: 183 mL (Normal 88-227 mL)  RV ESV: 101 mL (Normal 23-103 mL)  RV SV: 82 mL (Normal 52-138 mL)  CO: 3.9 L/min (Normal 2.8-8.8 L/min)  Indexed volumes:  RV EDV: 99 mL/sq-m (Normal 55-105 mL/sq-m)  RV ESV: 55 mL/sq-m (Normal 15-43 mL/sq-m)  RV SV: 44 mL/sq-m (Normal 32-64 mL/sq-m)  CI: 2.10 L/min/sq-m (Normal 1.7-4.2 L/min/sq-m)  IMPRESSION: 1. Normal left ventricular chamber size and function, LVEF 55%. No LV delayed myocardial enhancement or myocardial edema.  2. Normal right ventricular chamber size with borderline reduced RV systolic function, RVEF 45%. Borderline global RV hypokinesis.  3. There is delayed myocardial enhancement in an RV papillary muscle, likely the anterior papillary muscle. No RV free wall myocardial delayed enhancement.  4.  Mild aortic valve regurgitation, regurgitant fraction 11%.  5. Mild dilation of ascending aorta, approximately 41 mm in axial plane.  No findings to suggest an infiltrative cardiomyopathic process.   Electronically Signed By: Weston Brass M.D. On: 10/31/2021 22:45         Other studies Reviewed: Review of the additional studies/records demonstrates: No aortic atherosclerosis seen on imaging studies reviewed  Recent Labs: 10/17/2022: ALT 18; BUN 9; Creatinine, Ser 0.88; Hemoglobin 14.5; Platelets 221.0; Potassium 4.5; Sodium 135; TSH 1.00   Recent Lipid Panel Lab Results  Component Value Date/Time   CHOL 118 10/17/2022 10:29 AM   TRIG 62.0 10/17/2022 10:29 AM   HDL 55.70 10/17/2022 10:29 AM   LDLCALC 50 10/17/2022 10:29 AM   LDLCALC 46 07/09/2020 03:26 PM    Risk Assessment/Calculations:                Physical Exam:    VS:  BP 120/70   Pulse (!) 58   Ht 5\' 10"  (1.778 m)   Wt 161 lb 3.2 oz (73.1 kg)   SpO2 97%   BMI 23.13 kg/m    Wt Readings from Last 3 Encounters:  12/12/22 161 lb 3.2 oz (73.1 kg)  10/13/22 161  lb (73 kg)  09/26/22 162 lb 9.6 oz (73.8 kg)    GENERAL:  No apparent distress, AOx3 HEENT:  No carotid bruits, +2 carotid impulses, no scleral icterus CAR: RRR no murmurs, gallops, rubs, or thrills RES:  Clear to auscultation bilaterally ABD:  Soft, nontender, nondistended, positive bowel sounds x 4 VASC:  +2 radial pulses, +2 carotid pulses, palpable pedal pulses NEURO:  CN 2-12 grossly intact; motor and sensory grossly intact PSYCH:  No active depression or anxiety EXT:  No edema, ecchymosis, or cyanosis  Signed, Orbie Pyo, MD  12/12/2022 3:56 PM    [facility] [address] [place], [place], [place]  [address] Phone: [phone]; Fax: [phone]   Note:  This document was prepared using Dragon voice recognition software and may include unintentional dictation errors.

## 2022-12-12 NOTE — Progress Notes (Unsigned)
Enrolled for [employer] to mail a ZIO XT long term holter monitor to the patients address on file.  

## 2022-12-12 NOTE — Patient Instructions (Addendum)
Medication Instructions:  Your physician has recommended you make the following change in your medication:   Restart Imdur 30 mg daily  *If you need a refill on your cardiac medications before your next appointment, please call your pharmacy*   Testing/Procedures: Your physician has recommended that you wear an event monitor. Event monitors are medical devices that record the heart's electrical activity. Doctors most often [place] these monitors to diagnose arrhythmias. Arrhythmias are problems with the speed or rhythm of the heartbeat. The monitor is a small, portable device. You can wear one while you do your normal daily activities. This is usually used to diagnose what is causing palpitations/syncope (passing out).   Cardiac Pet stress test    Follow-Up: At [facility], you and your health needs are our priority.  As part of our continuing mission to provide you with exceptional heart care, we have created designated Provider Care Teams.  These Care Teams include your primary Cardiologist (physician) and Advanced Practice Providers (APPs -  Physician Assistants and Nurse Practitioners) who all work together to provide you with the care you need, when you need it.   Your next appointment:   3 month(s)  Provider:   APP  Other Instructions ZIO XT- Long Term Monitor Instructions  Your physician has requested you wear a ZIO patch monitor for 3 days.  This is a single patch monitor. [employer] supplies one patch monitor per enrollment. Additional stickers are not available. Please do not apply patch if you will be having a Nuclear Stress Test,  Echocardiogram, Cardiac CT, MRI, or Chest Xray during the period you would be wearing the  monitor. The patch cannot be worn during these tests. You cannot remove and re-apply the  ZIO XT patch monitor.  Your ZIO patch monitor will be mailed 3 day USPS to your address on file. It may take 3-5 days  to receive your monitor after you have been  enrolled.  Once you have received your monitor, please review the enclosed instructions. Your monitor  has already been registered assigning a specific monitor serial # to you.  Billing and Patient Assistance Program Information  We have supplied [employer] with any of your insurance information on file for billing purposes. [employer] offers a sliding scale Patient Assistance Program for patients that do not have  insurance, or whose insurance does not completely cover the cost of the ZIO monitor.  You must apply for the Patient Assistance Program to qualify for this discounted rate.  To apply, please call [employer] at [phone], select option 4, select option 2, ask to apply for  Patient Assistance Program. Meredeth Ide will ask your household income, and how many people  are in your household. They will quote your out-of-pocket cost based on that information.  [employer] will also be able to set up a 65-month, interest-free payment plan if needed.  Applying the monitor   Shave hair from upper left chest.  Hold abrader disc by orange tab. Rub abrader in 40 strokes over the upper left chest as  indicated in your monitor instructions.  Clean area with 4 enclosed alcohol pads. Let dry.  Apply patch as indicated in monitor instructions. Patch will be placed under collarbone on left  side of chest with arrow pointing upward.  Rub patch adhesive wings for 2 minutes. Remove white label marked "1". Remove the white  label marked "2". Rub patch adhesive wings for 2 additional minutes.  While looking in a mirror, press and release button in center  of patch. A small green light will  flash 3-4 times. This will be your only indicator that the monitor has been turned on.  Do not shower for the first 24 hours. You may shower after the first 24 hours.  Press the button if you feel a symptom. You will hear a small click. Record Date, Time and  Symptom in the Patient Logbook.  When you are ready to remove the  patch, follow instructions on the last 2 pages of Patient  Logbook. Stick patch monitor onto the last page of Patient Logbook.  Place Patient Logbook in the blue and white box. Use locking tab on box and tape box closed  securely. The blue and white box has prepaid postage on it. Please place it in the mailbox as  soon as possible. Your physician should have your test results approximately 7 days after the  monitor has been mailed back to [facility].  Call [facility] Customer Care at [phone] if you have questions regarding  your ZIO XT patch monitor. Call them immediately if you see an orange light blinking on your  monitor.  If your monitor falls off in less than 4 days, contact our Monitor department at [phone].  If your monitor becomes loose or falls off after 4 days call [employer] at [phone] for  suggestions on securing your monitor   How to Prepare for Your Cardiac PET/CT Stress Test:  1. Please do not take these medications before your test:   Medications that may interfere with the cardiac pharmacological stress agent (ex. nitrates - including erectile dysfunction medications, isosorbide mononitrate, tamulosin or beta-blockers) the day of the exam.  Imdur  Sildenafil (Erectile dysfunction medication should be held for at least 72 hrs prior to test)   2. Nothing to eat or drink, except water, 3 hours prior to arrival time.   NO caffeine/decaffeinated products, or chocolate 12 hours prior to arrival.  3. NO perfume, cologne or lotion  4. Total time is 1 to 2 hours; you may want to bring reading material for the waiting time.  5. Please report to Radiology at the [facility] Main Entrance 30 minutes early for your test.  [address] [place], [place] [address]    In preparation for your appointment, medication and supplies will be purchased.  Appointment availability is limited, so if you need to cancel or reschedule, please call the  Radiology Department at [phone]  24 hours in advance to avoid a cancellation fee of $100.00  What to Expect After you Arrive:  Once you arrive and check in for your appointment, you will be taken to a preparation room within the Radiology Department.  A technologist or Nurse will obtain your medical history, verify that you are correctly prepped for the exam, and explain the procedure.  Afterwards,  an IV will be started in your arm and electrodes will be placed on your skin for EKG monitoring during the stress portion of the exam. Then you will be escorted to the PET/CT scanner.  There, staff will get you positioned on the scanner and obtain a blood pressure and EKG.  During the exam, you will continue to be connected to the EKG and blood pressure machines.  A small, safe amount of a radioactive tracer will be injected in your IV to obtain a series of pictures of your heart along with an injection of a stress agent.    After your Exam:  It is recommended that you eat  a meal and drink a caffeinated beverage to counter act any effects of the stress agent.  Drink plenty of fluids for the remainder of the day and urinate frequently for the first couple of hours after the exam.  Your doctor will inform you of your test results within 7-10 business days.  For more information and frequently asked questions, please visit our website : [email or web address]  For questions about your test or how to prepare for your test, please call: Rockwell Alexandria, Cardiac Imaging Nurse Navigator  Larey Brick, Cardiac Imaging Nurse Navigator Office: [phone]

## 2022-12-14 DIAGNOSIS — R42 Dizziness and giddiness: Secondary | ICD-10-CM | POA: Diagnosis not present

## 2022-12-14 DIAGNOSIS — I2510 Atherosclerotic heart disease of native coronary artery without angina pectoris: Secondary | ICD-10-CM | POA: Diagnosis not present

## 2022-12-14 DIAGNOSIS — R072 Precordial pain: Secondary | ICD-10-CM | POA: Diagnosis not present

## 2022-12-19 ENCOUNTER — Telehealth: Payer: Self-pay | Admitting: Cardiology

## 2022-12-19 ENCOUNTER — Other Ambulatory Visit: Payer: Self-pay

## 2022-12-19 MED ORDER — METOPROLOL SUCCINATE ER 25 MG PO TB24: 12.5000 mg | ORAL_TABLET | Freq: Every evening | ORAL | 3 refills | Status: DC

## 2022-12-19 MED ORDER — ISOSORBIDE MONONITRATE ER 30 MG PO TB24: 15.0000 mg | ORAL_TABLET | Freq: Every day | ORAL | Status: DC

## 2022-12-19 NOTE — Telephone Encounter (Signed)
Pt aware okay to take 15 mg of Isosorbide .Zack Seal

## 2022-12-19 NOTE — Telephone Encounter (Signed)
Pt c/o medication issue:  1. Name of Medication:   isosorbide mononitrate (IMDUR) 30 MG 24 hr tablet   2. How are you currently taking this medication (dosage and times per day)?  One 30 mg tablet daily  3. Are you having a reaction (difficulty breathing--STAT)?   Headaches  4. What is your medication issue?   Patient stated he was recently put back on this medication and has been taking the medication at one 15 mg tablet daily.  Patient noted he started taking the medication at 30 mg which gave him headaches and he reduced the dosage to 15 mg and he has not had headaches.  Patient wants to confirm which dosage he should be taking.

## 2022-12-19 NOTE — Telephone Encounter (Signed)
James Pyo, MD  Alois Cliche, LPN Caller: Unspecified (Today,  1:44 PM) OK, please change

## 2022-12-19 NOTE — Telephone Encounter (Signed)
Spoke with pt and after starting back on the Imdur 30 mg noted having a headache Per pt decreased to 15 mg which  had taken in the past and headache is gone and angina is gone as well Will forward to Dr Lynnette Caffey for review Pt prefers to take 15 mg at this time ./cy

## 2022-12-26 DIAGNOSIS — R072 Precordial pain: Secondary | ICD-10-CM | POA: Diagnosis not present

## 2022-12-26 DIAGNOSIS — R42 Dizziness and giddiness: Secondary | ICD-10-CM | POA: Diagnosis not present

## 2022-12-28 ENCOUNTER — Telehealth: Payer: Self-pay | Admitting: *Deleted

## 2022-12-28 MED ORDER — APIXABAN 5 MG PO TABS: 5.0000 mg | ORAL_TABLET | Freq: Two times a day (BID) | ORAL | 5 refills | Status: DC

## 2022-12-28 MED ORDER — METOPROLOL SUCCINATE ER 25 MG PO TB24: 25.0000 mg | ORAL_TABLET | Freq: Every day | ORAL | 3 refills | Status: DC

## 2022-12-28 NOTE — Telephone Encounter (Signed)
-----   Message from Orbie Pyo, MD sent at 12/27/2022 12:25 PM EDT ----- Increase Toprol to 25, start Eliquis 5mg  BID, and AF clinic referral.

## 2022-12-28 NOTE — Telephone Encounter (Signed)
Called and spoke w patient.  He voices understanding of all recommendations.  Will pick up Eliquis 5 mg and start this evening and will increase Toprol XL to whole tablet each day at bedtime.  He is hopeful this increase will help his lightheadedness, weakness, dizziness.  He has not heard about when the PET NM CT Stress will be scheduled yet.    Arranged follow up with afib clinic.  01/05/23.

## 2023-01-05 ENCOUNTER — Ambulatory Visit (HOSPITAL_COMMUNITY)
Admission: RE | Admit: 2023-01-05 | Discharge: 2023-01-05 | Disposition: A | Discharge status: Home / Self Care | Payer: Medicare Other | Source: Ambulatory Visit | Attending: Internal Medicine | Admitting: Internal Medicine

## 2023-01-05 ENCOUNTER — Encounter (HOSPITAL_COMMUNITY): Payer: Self-pay | Admitting: Internal Medicine

## 2023-01-05 VITALS — BP 136/84 | HR 54 | Ht 70.0 in | Wt 160.9 lb

## 2023-01-05 DIAGNOSIS — I480 Paroxysmal atrial fibrillation: Secondary | ICD-10-CM | POA: Diagnosis not present

## 2023-01-05 DIAGNOSIS — D6869 Other thrombophilia: Secondary | ICD-10-CM | POA: Diagnosis not present

## 2023-01-05 DIAGNOSIS — I4891 Unspecified atrial fibrillation: Secondary | ICD-10-CM | POA: Insufficient documentation

## 2023-01-05 DIAGNOSIS — Z7901 Long term (current) use of anticoagulants: Secondary | ICD-10-CM | POA: Insufficient documentation

## 2023-01-05 DIAGNOSIS — E785 Hyperlipidemia, unspecified: Secondary | ICD-10-CM | POA: Diagnosis not present

## 2023-01-05 DIAGNOSIS — I351 Nonrheumatic aortic (valve) insufficiency: Secondary | ICD-10-CM | POA: Insufficient documentation

## 2023-01-05 DIAGNOSIS — I77819 Aortic ectasia, unspecified site: Secondary | ICD-10-CM | POA: Diagnosis not present

## 2023-01-05 NOTE — Progress Notes (Addendum)
Primary Care Physician: Bradd Canary, MD Primary Cardiologist: Meriam Sprague, MD Electrophysiologist: Maurice Small, MD     Referring Physician: Dr. Otis Brace is a 82 y.o. male with a history of presyncope, HLD, and paroxysmal atrial fibrillation who presents for consultation in the [facility]. Seen by Dr. Lynnette Caffey on 6/24 and 3-day cardiac monitor placed for presyncope found Afib (<1% burden, longest episode 23 min). Patient is on Eliquis 5 mg BID for a CHADS2VASC score of 2.  On evaluation today, James [name] is currently in NSR. James [name] began Eliquis and increased Toprol 25 mg as directed by Dr. Lynnette Caffey. James [name] just received Kardiamobile device yesterday to monitor rhythm at home. 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   Today, James [name] denies symptoms of palpitations, chest pain, shortness of breath, orthopnea, PND, lower extremity edema, dizziness, presyncope, syncope, snoring, daytime somnolence, bleeding, or neurologic sequela. The patient is tolerating medications without difficulties and is otherwise without complaint today.   Atrial Fibrillation Risk Factors:  James [name] does not have symptoms or diagnosis of sleep apnea.  James [name] has a BMI of Body mass index is 23.09 kg/m.Marland Kitchen Filed Weights   01/05/23 1436  Weight: 73 kg    Current Outpatient Medications  Medication Sig Dispense Refill   apixaban (ELIQUIS) 5 MG TABS tablet Take 1 tablet (5 mg total) by mouth 2 (two) times daily. 60 tablet 5   atorvastatin (LIPITOR) 20 MG tablet TAKE 1 TABLET BY MOUTH EVERY DAY FOR CHOLESTEROL 90 tablet 3   Calcium Carb-Cholecalciferol (CALCIUM 1000 + D PO) Take 1 tablet by mouth 3 (three) times a week.      COLLAGEN PO Take 1 capsule by mouth every other day. (Chewable - has hyaluronic acid, collagen and biotin)     Cyanocobalamin (VITAMIN B 12 PO) Take 3,000 mcg by mouth daily.     diclofenac Sodium  (VOLTAREN) 1 % GEL Apply 4 g topically 4 (four) times daily. To affected joint. (Patient taking differently: Apply 4 g topically as needed. To affected joint.) 100 g 11   Dutasteride-Tamsulosin HCl 0.5-0.4 MG CAPS TAKE 1 CAPSULE BY MOUTH AT BEDTIME 90 capsule 1   ELDERBERRY PO Take 50 mg by mouth. 4 or 5 times per week     fluticasone (FLONASE) 50 MCG/ACT nasal spray Place 2 sprays into both nostrils daily. 16 g 6   guaiFENesin (MUCINEX) 600 MG 12 hr tablet Take 2 tablets (1,200 mg total) by mouth 2 (two) times daily as needed. 30 tablet 0   isosorbide mononitrate (IMDUR) 30 MG 24 hr tablet Take 0.5 tablets (15 mg total) by mouth daily.     levothyroxine (SYNTHROID) 100 MCG tablet Take 1 tablet (100 mcg total) by mouth daily before breakfast. 90 tablet 1   LORazepam (ATIVAN) 0.5 MG tablet TAKE 1 TABLET BY MOUTH TWICE DAILY AS NEEDED FOR ANXIETY 30 tablet 1   Melatonin 10 MG TABS Take 10 mg by mouth as needed.     metoprolol succinate (TOPROL XL) 25 MG 24 hr tablet Take 1 tablet (25 mg total) by mouth daily. 90 tablet 3   Milk Thistle 1000 MG CAPS Take 1,000 mg by mouth in the morning.     mirtazapine (REMERON) 15 MG tablet TAKE 1 TABLET BY MOUTH AT BEDTIME 90 tablet 0   Multiple Vitamins-Minerals (MULTIVITAMINS THER. W/MINERALS) TABS Take 1 tablet  by mouth daily.     pantoprazole (PROTONIX) 40 MG tablet TAKE 1 TABLET BY MOUTH 2 TIMES A DAY BEFORE MEALS FOR ACID REFLUX (Patient taking differently: Take 40 mg by mouth in the morning.) 180 tablet 1   Probiotic Product (PROBIOTIC DAILY PO) Take 1 tablet by mouth daily.     sildenafil (REVATIO) 20 MG tablet TAKE 1 TO 5 TABLETS BY MOUTH EVERY DAY AS NEEDED 35 tablet 1   Testosterone 30 MG/ACT SOLN Apply 2 Pump topically daily. 180 mL 1   Turmeric (QC TUMERIC COMPLEX) 500 MG CAPS Take 1 tablet by mouth daily.     zolpidem (AMBIEN) 10 MG tablet TAKE 1 TABLET BY MOUTH AT BEDTIME AS NEEDED FOR SLEEP 30 tablet 2   ASHWAGANDHA PO Take 1 tablet by mouth daily.  (Patient not taking: Reported on 01/05/2023)     No current facility-administered medications for this encounter.    Atrial Fibrillation Management history:  Previous antiarrhythmic drugs: None Previous cardioversions: None Previous ablations: None Anticoagulation history: Eliquis 5 mg BID  ROS- All systems are reviewed and negative except as per the HPI above.  Physical Exam: BP 136/84   Pulse (!) 54   Ht 5\' 10"  (1.778 m)   Wt 73 kg   BMI 23.09 kg/m   GEN: Well nourished, well developed in no acute distress NECK: No JVD; No carotid bruits CARDIAC: Regular rate and rhythm, no murmurs, rubs, gallops RESPIRATORY:  Clear to auscultation without rales, wheezing or rhonchi  ABDOMEN: Soft, non-tender, non-distended EXTREMITIES:  No edema; No deformity   EKG today demonstrates  Vent. rate 54 BPM PR interval 142 ms QRS duration 112 ms QT/QTcB 434/411 ms P-R-T axes 64 51 29 Sinus bradycardia Otherwise normal ECG When compared with ECG of 12-Dec-2022 15:37, PREVIOUS ECG IS PRESENT  Echo 10/26/22 demonstrated  1. Left ventricular ejection fraction, by estimation, is 55 to 60%. The  left ventricle has normal function. The left ventricle has no regional  wall motion abnormalities. There is mild left ventricular hypertrophy.  Left ventricular diastolic parameters  are consistent with Grade I diastolic dysfunction (impaired relaxation).   2. Right ventricular systolic function is normal. The right ventricular  size is normal. There is normal pulmonary artery systolic pressure. The  estimated right ventricular systolic pressure is 24.9 mmHg.   3. The mitral valve is normal in structure. Trivial mitral valve  regurgitation. No evidence of mitral stenosis.   4. The aortic valve is tricuspid. Aortic valve regurgitation is mild. No  aortic stenosis is present.   5. Aortic dilatation noted. There is dilatation of the ascending aorta,  measuring 41 mm.   6. The inferior vena cava is  normal in size with greater than 50%  respiratory variability, suggesting right atrial pressure of 3 mmHg.   Cardiac monitor 11/2022: No sustained ventricular tachyarrhythmias, or bradyarrhythmias were detected.   Short duration atrial fibrillation (<1%) was noted.   Patient-triggered events corresponded with sinus rhythm, SVT, PACs, and PVCs.  ASSESSMENT & PLAN CHA2DS2-VASc Score = 2  The patient's score is based upon: CHF History: 0 HTN History: 0 Diabetes History: 0 Stroke History: 0 Vascular Disease History: 0 Age Score: 2 Gender Score: 0       ASSESSMENT AND PLAN: Paroxysmal Atrial Fibrillation (ICD10:  I48.0) The patient's CHA2DS2-VASc score is 2, indicating a 2.2% annual risk of stroke.    Education provided about Afib and treatments going forward. Discussion about medication treatments and ablation in detail. After discussion, we  will proceed with conservative observation at this time. James [name] is going to monitor his burden via Kardiamobile device and decide whether James [name]'d like to establish with an EP to consider ablation if has increased Afib. If James [name] declines EP appt, would then bring him back in 1 month for CBC for Eliquis and burden reassessment.   We will defer sleep study because James [name] does not appears to have symptoms for sleep apnea.   Secondary Hypercoagulable State (ICD10:  D68.69) The patient is at significant risk for stroke/thromboembolism based upon his CHA2DS2-VASc Score of 2.  Continue Apixaban (Eliquis).  No missed doses.     James [name] will call to determine whether wants to establish with EP.    Lake Bells, PA-C  Afib Clinic [facility] [address] [place], [place] [address] [phone]

## 2023-01-18 DIAGNOSIS — D485 Neoplasm of uncertain behavior of skin: Secondary | ICD-10-CM | POA: Diagnosis not present

## 2023-01-18 DIAGNOSIS — L814 Other melanin hyperpigmentation: Secondary | ICD-10-CM | POA: Diagnosis not present

## 2023-01-18 DIAGNOSIS — Z129 Encounter for screening for malignant neoplasm, site unspecified: Secondary | ICD-10-CM | POA: Diagnosis not present

## 2023-01-18 DIAGNOSIS — L218 Other seborrheic dermatitis: Secondary | ICD-10-CM | POA: Diagnosis not present

## 2023-01-18 DIAGNOSIS — L570 Actinic keratosis: Secondary | ICD-10-CM | POA: Diagnosis not present

## 2023-01-18 DIAGNOSIS — Z808 Family history of malignant neoplasm of other organs or systems: Secondary | ICD-10-CM | POA: Diagnosis not present

## 2023-01-18 DIAGNOSIS — Z85828 Personal history of other malignant neoplasm of skin: Secondary | ICD-10-CM | POA: Diagnosis not present

## 2023-01-18 DIAGNOSIS — L820 Inflamed seborrheic keratosis: Secondary | ICD-10-CM | POA: Diagnosis not present

## 2023-01-20 ENCOUNTER — Other Ambulatory Visit: Payer: Self-pay | Admitting: Family Medicine

## 2023-01-20 NOTE — Telephone Encounter (Signed)
Requesting: Ambien 10mg   Contract: 10/20/22 UDS: 01/11/21 Last Visit: 10/13/22 w/ Ladona Ridgel Next Visit: 04/18/23 Last Refill: 10/25/22 #30 and 2RF   Please Advise

## 2023-01-28 ENCOUNTER — Other Ambulatory Visit: Payer: Self-pay | Admitting: Family Medicine

## 2023-02-08 ENCOUNTER — Encounter (HOSPITAL_COMMUNITY): Payer: Self-pay

## 2023-02-09 ENCOUNTER — Ambulatory Visit
Admission: RE | Admit: 2023-02-09 | Discharge: 2023-02-09 | Disposition: A | Discharge status: Home / Self Care | Payer: Medicare Other | Source: Ambulatory Visit | Attending: Internal Medicine | Admitting: Internal Medicine

## 2023-02-09 DIAGNOSIS — R42 Dizziness and giddiness: Secondary | ICD-10-CM

## 2023-02-09 DIAGNOSIS — I2510 Atherosclerotic heart disease of native coronary artery without angina pectoris: Secondary | ICD-10-CM

## 2023-02-09 DIAGNOSIS — R072 Precordial pain: Secondary | ICD-10-CM

## 2023-02-15 ENCOUNTER — Telehealth (HOSPITAL_COMMUNITY): Payer: Self-pay | Admitting: *Deleted

## 2023-02-15 NOTE — Telephone Encounter (Signed)
Reaching out to patient to offer assistance regarding upcoming cardiac imaging study; pt verbalizes understanding of appt date/time, parking situation and where to check in, pre-test NPO status, and verified current allergies; name and call back number provided for further questions should they arise  Larey Brick RN Navigator Cardiac Imaging [facility] [phone] office [phone] cell  Patient aware to avoid Imdur and caffeine prior to his cardiac PET scan.

## 2023-02-16 ENCOUNTER — Ambulatory Visit
Admission: RE | Admit: 2023-02-16 | Discharge: 2023-02-16 | Disposition: A | Discharge status: Home / Self Care | Payer: Medicare Other | Source: Ambulatory Visit | Attending: Internal Medicine | Admitting: Internal Medicine

## 2023-02-16 DIAGNOSIS — D7389 Other diseases of spleen: Secondary | ICD-10-CM | POA: Insufficient documentation

## 2023-02-16 DIAGNOSIS — K753 Granulomatous hepatitis, not elsewhere classified: Secondary | ICD-10-CM | POA: Diagnosis not present

## 2023-02-16 DIAGNOSIS — R42 Dizziness and giddiness: Secondary | ICD-10-CM | POA: Insufficient documentation

## 2023-02-16 DIAGNOSIS — R072 Precordial pain: Secondary | ICD-10-CM | POA: Diagnosis not present

## 2023-02-16 DIAGNOSIS — I2510 Atherosclerotic heart disease of native coronary artery without angina pectoris: Secondary | ICD-10-CM | POA: Insufficient documentation

## 2023-02-16 DIAGNOSIS — R935 Abnormal findings on diagnostic imaging of other abdominal regions, including retroperitoneum: Secondary | ICD-10-CM | POA: Diagnosis not present

## 2023-02-16 DIAGNOSIS — D71 Functional disorders of polymorphonuclear neutrophils: Secondary | ICD-10-CM | POA: Diagnosis not present

## 2023-02-16 MED ORDER — REGADENOSON 0.4 MG/5ML IV SOLN
INTRAVENOUS | Status: AC
  Filled 2023-02-16: qty 5

## 2023-02-16 MED ORDER — REGADENOSON 0.4 MG/5ML IV SOLN
0.4000 mg | Freq: Once | INTRAVENOUS | Status: AC
  Administered 2023-02-16: 0.4 mg via INTRAVENOUS
  Filled 2023-02-16: qty 5

## 2023-02-16 MED ORDER — RUBIDIUM RB82 GENERATOR (RUBYFILL)
25.0000 | PACK | Freq: Once | INTRAVENOUS | Status: AC
  Administered 2023-02-16: 18.79 via INTRAVENOUS

## 2023-02-16 MED ORDER — RUBIDIUM RB82 GENERATOR (RUBYFILL)
25.0000 | PACK | Freq: Once | INTRAVENOUS | Status: AC
  Administered 2023-02-16: 18.82 via INTRAVENOUS

## 2023-02-16 NOTE — Progress Notes (Signed)
Patient presents for a cardiac PET stress test and tolerated procedure without incident. Patient reported some flushing that resolved by the conclusion of the test. Patient maintained acceptable vital signs throughout the test and was offered caffeine after test.  Patient ambulated out of department with a steady gait.

## 2023-02-17 ENCOUNTER — Other Ambulatory Visit: Payer: Self-pay | Admitting: Family Medicine

## 2023-02-17 LAB — NM PET CT CARDIAC PERFUSION MULTI W/ABSOLUTE BLOODFLOW
MBFR: 2.52
Nuc Rest EF: 50 %
Nuc Stress EF: 59 %
Peak HR: 68 {beats}/min
Rest HR: 49 {beats}/min
Rest MBF: 0.73 ml/g/min
Rest Nuclear Isotope Dose: 18.8 mCi
Stress MBF: 1.84 ml/g/min
Stress Nuclear Isotope Dose: 18.8 mCi
TID: 0.99

## 2023-02-21 ENCOUNTER — Telehealth: Payer: Self-pay

## 2023-02-21 NOTE — Telephone Encounter (Signed)
Advised patient that Dr. Lynnette Caffey is okay with him staying on the Imdur. He will let us know if symptoms come back.

## 2023-02-21 NOTE — Telephone Encounter (Signed)
-----   Message from Orbie Pyo sent at 02/21/2023  1:35 PM EDT ----- Let him know his stress test did not demonstrate abnormalities consistent with ischemia or microvascular dysfunction (the latter was what we were really interested in).  It does not rule out coronary vasospasm.  I would like him to start verapamil extended release 180mg  qday.  The verapamil and imdur (he is already on) can help with vasospasm.  If not helpful, he needs to speak to PCP about noncardiac chest pain.

## 2023-02-21 NOTE — Telephone Encounter (Signed)
The patient has been notified of the result and verbalized understanding.  All questions (if any) were answered. Frutoso Schatz, RN 02/21/2023 3:06 PM  Patient reports that since going back on Imdur his symptoms have improved. He rarely has chest pain anymore. He would like to know if Dr. Lynnette Caffey still thinks he needs to start on verapamil or if he is okay to just keep taking the Imdur.

## 2023-02-23 ENCOUNTER — Other Ambulatory Visit: Payer: Medicare Other

## 2023-03-14 ENCOUNTER — Ambulatory Visit: Payer: Medicare Other | Admitting: Physician Assistant

## 2023-03-20 ENCOUNTER — Other Ambulatory Visit: Payer: Self-pay | Admitting: Family Medicine

## 2023-03-21 ENCOUNTER — Ambulatory Visit: Payer: Medicare Other | Attending: Physician Assistant | Admitting: Physician Assistant

## 2023-03-21 ENCOUNTER — Encounter: Payer: Self-pay | Admitting: Physician Assistant

## 2023-03-21 VITALS — BP 130/80 | HR 90 | Ht 70.0 in | Wt 162.0 lb

## 2023-03-21 DIAGNOSIS — E785 Hyperlipidemia, unspecified: Secondary | ICD-10-CM | POA: Diagnosis not present

## 2023-03-21 DIAGNOSIS — R072 Precordial pain: Secondary | ICD-10-CM | POA: Diagnosis not present

## 2023-03-21 DIAGNOSIS — I77819 Aortic ectasia, unspecified site: Secondary | ICD-10-CM

## 2023-03-21 DIAGNOSIS — I714 Abdominal aortic aneurysm, without rupture, unspecified: Secondary | ICD-10-CM

## 2023-03-21 DIAGNOSIS — I480 Paroxysmal atrial fibrillation: Secondary | ICD-10-CM | POA: Diagnosis not present

## 2023-03-21 MED ORDER — AMLODIPINE BESYLATE 2.5 MG PO TABS: 2.5000 mg | ORAL_TABLET | Freq: Every day | ORAL | 3 refills | Status: DC

## 2023-03-21 NOTE — Progress Notes (Addendum)
Cardiology Office Note:    Date:  03/21/2023  ID:  [name], DOB 02/08/1941, MRN [id number] PCP: Bradd Canary, MD   HeartCare Providers Cardiologist:  Orbie Pyo, MD Electrophysiologist:  Maurice Small, MD       Patient Profile:      Coronary artery disease  Chest pain  LHC 2008, 2013: Nonobstructive CAD Cardiac catheterization 10/01/21: No CAD EF 55 Cardiac MRI 10/27/2021: EF 55, no LGE, RVEF 45, mild AI, ascending aorta 41 mm Myocardial PET 02/16/23: no ischemia, no infarction, EF 50, normal myocardial blood flow reserve, +Coronary Ca2+ [no signs of microvascular dysfunction] Atrial tachycardia Eval by EP in past - Dr. Nelly Laurence Paroxysmal atrial fibrillation  Monitor 12/2022: NSR, NSVT, SVT runs (13 beats); A-fib <1% MV Prolapse  TTE 10/26/22: EF 55-60, no RWMA, mild LVH, GR 1 DD, normal RVSF, RVSP 24.9, trivial MR, mild AI, ascending aorta 41 mm, RAP 3 Dilated ascending aorta  Hyperlipidemia  Hypertension  Carotid artery disease [place] 04/19/2019: Bilateral ICA 1-39 Abdominal aortic aneurysm [place] in 2016: 3 cm [place] in 03/2020: 2.6 cm (rec to repeat in 2-3 years) Lightheadedness            History of Present Illness:  Discussed the use of AI scribe software for clinical note transcription with the patient, who gave verbal consent to proceed.  James [name] is a 82 y.o. male who returns for follow up of chest pain, atrial fibrillation. He was last seen by Dr. Lynnette Caffey in 11/2022. The pt has a long hx of chest pain with extensive eval in the past. His cardiac catheterization in 09/2021 showed no CAD. The pt noted symptoms of lightheadedness. A Myocardial PET was done to screen for microvascular ischemia. The PET was normal. He was started on CCB to cover for vasospasm. His monitor showed <1% AFib. He was started on Eliquis and referred to the AFib Clinic. Conservative management was continued.  He is here alone.  Since resuming Imdur, he has not had recurrent chest  discomfort.  He also has not had any further lightheadedness.  He walks competitively.  He has not had syncope, orthopnea, leg edema.     Review of Systems  Gastrointestinal:  Negative for hematochezia and melena.  Genitourinary:  Negative for hematuria.  See HPI     Studies Reviewed:        Risk Assessment/Calculations:    CHA2DS2-VASc Score = 3   This indicates a 3.2% annual risk of stroke. The patient's score is based upon: CHF History: 0 HTN History: 0 Diabetes History: 0 Stroke History: 0 Vascular Disease History: 1 Age Score: 2 Gender Score: 0            Physical Exam:   VS:  BP 130/80   Pulse 90   Ht 5\' 10"  (1.778 m)   Wt 162 lb (73.5 kg)   SpO2 97%   BMI 23.24 kg/m    Wt Readings from Last 3 Encounters:  03/21/23 162 lb (73.5 kg)  01/05/23 160 lb 14.4 oz (73 kg)  12/12/22 161 lb 3.2 oz (73.1 kg)    Constitutional:      Appearance: Healthy appearance. Not in distress.  Neck:     Vascular: No JVR. JVD normal.  Pulmonary:     Breath sounds: Normal breath sounds. No wheezing. No rales.  Cardiovascular:     Normal rate. Regular rhythm.     Murmurs: There is no murmur.  Edema:    Peripheral edema  absent.  Abdominal:     Palpations: Abdomen is soft.        Assessment and Plan:   Assessment & Plan Precordial chest pain Symptoms likely due to vasospasm. Cath in 2023 showed no CAD. Recent myocardial PET with no ischemia and normal myocardial blood flow reserve. He did have coronary Ca2+ noted on CT. He has had improved symptoms since resuming Imdur 15mg  daily.  However, he does take sildenafil as needed.  He has been holding Imdur for 24 hours prior to using sildenafil. -Discontinue isosorbide -Start amlodipine 2.5mg  daily -Notify office if symptoms do not remain stable. -Follow-up 6 months Paroxysmal atrial fibrillation (HCC) He seems to be maintaining sinus rhythm on exam.  He is tolerating anticoagulation well. -Continue Eliquis 5mg  twice daily,  Toprol-XL 25 mg daily. -Check BMP and CBC today. -Follow-up 6 months Hyperlipidemia LDL goal <70 Optimal LDL in April 2024 at 50 on Lipitor 20mg  daily. -Continue Lipitor 20mg  daily. 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  Consider repeat echocardiogram in 1 year. Abdominal aortic aneurysm (AAA) without rupture, unspecified part (HCC) Ultrasound the past demonstrated measurement of 3 cm.  Last ultrasound in 2021 measured at 2.6 cm (no AAA).  Recommendation at that time was to follow-up with another echocardiogram in 2 to 3 years.  Consider arranging at follow-up visit.       Dispo:  Return in about 6 months (around 09/19/2023) for Routine Follow Up, w/ Dr. Lynnette Caffey.  Signed, Tereso Newcomer, PA-C

## 2023-03-21 NOTE — Assessment & Plan Note (Signed)
He seems to be maintaining sinus rhythm on exam.  He is tolerating anticoagulation well. -Continue Eliquis 5mg  twice daily, Toprol-XL 25 mg daily. -Check BMP and CBC today. -Follow-up 6 months

## 2023-03-21 NOTE — Patient Instructions (Signed)
Medication Instructions:  Your physician has recommended you make the following change in your medication:   STOP Imdur  START Amlodipine 2.5 mg taking 1 daily   *If you need a refill on your cardiac medications before your next appointment, please call your pharmacy*   Lab Work: TODAY:  BMET & CBC  If you have labs (blood work) drawn today and your tests are completely normal, you will receive your results only by: MyChart Message (if you have MyChart) OR A paper copy in the mail If you have any lab test that is abnormal or we need to change your treatment, we will call you to review the results.   Testing/Procedures: None ordered   Follow-Up: At [facility], you and your health needs are our priority.  As part of our continuing mission to provide you with exceptional heart care, we have created designated Provider Care Teams.  These Care Teams include your primary Cardiologist (physician) and Advanced Practice Providers (APPs -  Physician Assistants and Nurse Practitioners) who all work together to provide you with the care you need, when you need it.  We recommend signing up for the patient portal called "MyChart".  Sign up information is provided on this After Visit Summary.  MyChart is used to connect with patients for Virtual Visits (Telemedicine).  Patients are able to view lab/test results, encounter notes, upcoming appointments, etc.  Non-urgent messages can be sent to your provider as well.   To learn more about what you can do with MyChart, go to [email or web address].    Your next appointment:   6 month(s)  Provider:   Orbie Pyo, MD     Other Instructions

## 2023-03-21 NOTE — Assessment & Plan Note (Signed)
Ultrasound the past demonstrated measurement of 3 cm.  Last ultrasound in 2021 measured at 2.6 cm (no AAA).  Recommendation at that time was to follow-up with another echocardiogram in 2 to 3 years.  Consider arranging at follow-up visit.

## 2023-03-22 LAB — CBC
Hematocrit: 41.7 % (ref 37.5–51.0)
Hemoglobin: 14.3 g/dL (ref 13.0–17.7)
MCH: 32.9 pg (ref 26.6–33.0)
MCHC: 34.3 g/dL (ref 31.5–35.7)
MCV: 96 fL (ref 79–97)
Platelets: 206 10*3/uL (ref 150–450)
RBC: 4.34 x10E6/uL (ref 4.14–5.80)
RDW: 12.2 % (ref 11.6–15.4)
WBC: 6.5 10*3/uL (ref 3.4–10.8)

## 2023-03-22 LAB — BASIC METABOLIC PANEL
BUN/Creatinine Ratio: 15 (ref 10–24)
BUN: 13 mg/dL (ref 8–27)
CO2: 25 mmol/L (ref 20–29)
Calcium: 9.2 mg/dL (ref 8.6–10.2)
Chloride: 95 mmol/L — ABNORMAL LOW (ref 96–106)
Creatinine, Ser: 0.84 mg/dL (ref 0.76–1.27)
Glucose: 83 mg/dL (ref 70–99)
Potassium: 4.5 mmol/L (ref 3.5–5.2)
Sodium: 134 mmol/L (ref 134–144)
eGFR: 87 mL/min/{1.73_m2} (ref >59)

## 2023-03-25 ENCOUNTER — Other Ambulatory Visit: Payer: Self-pay | Admitting: Family Medicine

## 2023-03-27 NOTE — Telephone Encounter (Signed)
Requesting: lorazepam 0.5mg   Contract: 10/20/22 UDS: 01/11/21 Last Visit: 10/13/22 w/ Ladona Ridgel Next Visit: 04/18/23 Last Refill: 10/28/22 #30 and 1RF   Please Advise

## 2023-03-27 NOTE — Progress Notes (Signed)
Pt has been made aware of normal result and verbalized understanding.  jw

## 2023-03-28 ENCOUNTER — Ambulatory Visit: Payer: Medicare Other | Admitting: Family Medicine

## 2023-03-29 DIAGNOSIS — L570 Actinic keratosis: Secondary | ICD-10-CM | POA: Diagnosis not present

## 2023-04-07 ENCOUNTER — Other Ambulatory Visit: Payer: Self-pay | Admitting: Family Medicine

## 2023-04-07 DIAGNOSIS — I10 Essential (primary) hypertension: Secondary | ICD-10-CM

## 2023-04-16 NOTE — Assessment & Plan Note (Signed)
Encourage heart healthy diet such as MIND or DASH diet, increase exercise, avoid trans fats, simple carbohydrates and processed foods, consider a krill or fish or flaxseed oil cap daily. Tolerating Atorvastatin 

## 2023-04-16 NOTE — Assessment & Plan Note (Signed)
Well controlled, no changes to meds. Encouraged heart healthy diet such as the DASH diet and exercise as tolerated.  RSV (respiratory syncitial virus) vaccine at pharmacy, Arexvy Covid booster new version at pharmacy High dose flu shot  Tetanus is due

## 2023-04-16 NOTE — Assessment & Plan Note (Signed)
hgba1c acceptable, minimize simple carbs. Increase exercise as tolerated.  

## 2023-04-16 NOTE — Assessment & Plan Note (Signed)
Supplement and monitor 

## 2023-04-16 NOTE — Assessment & Plan Note (Signed)
On Levothyroxine, continue to monitor 

## 2023-04-16 NOTE — Assessment & Plan Note (Signed)
Rate controlled, tolerating meds 

## 2023-04-18 ENCOUNTER — Ambulatory Visit (INDEPENDENT_AMBULATORY_CARE_PROVIDER_SITE_OTHER): Payer: Medicare Other | Admitting: Family Medicine

## 2023-04-18 ENCOUNTER — Other Ambulatory Visit (HOSPITAL_BASED_OUTPATIENT_CLINIC_OR_DEPARTMENT_OTHER): Payer: Self-pay

## 2023-04-18 VITALS — BP 110/68 | HR 53 | Temp 97.8°F | Resp 16 | Ht 70.0 in | Wt 163.0 lb

## 2023-04-18 DIAGNOSIS — R7989 Other specified abnormal findings of blood chemistry: Secondary | ICD-10-CM | POA: Diagnosis not present

## 2023-04-18 DIAGNOSIS — Z23 Encounter for immunization: Secondary | ICD-10-CM

## 2023-04-18 DIAGNOSIS — I10 Essential (primary) hypertension: Secondary | ICD-10-CM | POA: Diagnosis not present

## 2023-04-18 DIAGNOSIS — E785 Hyperlipidemia, unspecified: Secondary | ICD-10-CM

## 2023-04-18 DIAGNOSIS — N289 Disorder of kidney and ureter, unspecified: Secondary | ICD-10-CM | POA: Diagnosis not present

## 2023-04-18 DIAGNOSIS — Z79899 Other long term (current) drug therapy: Secondary | ICD-10-CM | POA: Diagnosis not present

## 2023-04-18 DIAGNOSIS — I4891 Unspecified atrial fibrillation: Secondary | ICD-10-CM

## 2023-04-18 DIAGNOSIS — E039 Hypothyroidism, unspecified: Secondary | ICD-10-CM

## 2023-04-18 DIAGNOSIS — R739 Hyperglycemia, unspecified: Secondary | ICD-10-CM

## 2023-04-18 LAB — CBC WITH DIFFERENTIAL/PLATELET
Basophils Absolute: 0 K/uL (ref 0.0–0.1)
Basophils Relative: 0.5 % (ref 0.0–3.0)
Eosinophils Absolute: 0.1 K/uL (ref 0.0–0.7)
Eosinophils Relative: 1.8 % (ref 0.0–5.0)
HCT: 43.9 % (ref 39.0–52.0)
Hemoglobin: 14.7 g/dL (ref 13.0–17.0)
Lymphocytes Relative: 34.7 % (ref 12.0–46.0)
Lymphs Abs: 2.4 K/uL (ref 0.7–4.0)
MCHC: 33.6 g/dL (ref 30.0–36.0)
MCV: 96.4 fl (ref 78.0–100.0)
Monocytes Absolute: 0.9 K/uL (ref 0.1–1.0)
Monocytes Relative: 12.9 % — ABNORMAL HIGH (ref 3.0–12.0)
Neutro Abs: 3.4 K/uL (ref 1.4–7.7)
Neutrophils Relative %: 50.1 % (ref 43.0–77.0)
Platelets: 226 K/uL (ref 150.0–400.0)
RBC: 4.55 Mil/uL (ref 4.22–5.81)
RDW: 13.1 % (ref 11.5–15.5)
WBC: 6.8 K/uL (ref 4.0–10.5)

## 2023-04-18 LAB — LIPID PANEL
Cholesterol: 130 mg/dL (ref 0–200)
HDL: 65.3 mg/dL (ref >39.00)
LDL Cholesterol: 49 mg/dL (ref 0–99)
NonHDL: 65
Total CHOL/HDL Ratio: 2
Triglycerides: 80 mg/dL (ref 0.0–149.0)
VLDL: 16 mg/dL (ref 0.0–40.0)

## 2023-04-18 LAB — COMPREHENSIVE METABOLIC PANEL
ALT: 18 U/L (ref 0–53)
AST: 24 U/L (ref 0–37)
Albumin: 4.2 g/dL (ref 3.5–5.2)
Alkaline Phosphatase: 55 U/L (ref 39–117)
BUN: 13 mg/dL (ref 6–23)
CO2: 29 meq/L (ref 19–32)
Calcium: 9.5 mg/dL (ref 8.4–10.5)
Chloride: 99 meq/L (ref 96–112)
Creatinine, Ser: 0.89 mg/dL (ref 0.40–1.50)
GFR: 79.33 mL/min (ref >60.00)
Glucose, Bld: 86 mg/dL (ref 70–99)
Potassium: 4.2 meq/L (ref 3.5–5.1)
Sodium: 134 meq/L — ABNORMAL LOW (ref 135–145)
Total Bilirubin: 1 mg/dL (ref 0.2–1.2)
Total Protein: 7 g/dL (ref 6.0–8.3)

## 2023-04-18 LAB — TESTOSTERONE: Testosterone: 515.51 ng/dL (ref 300.00–890.00)

## 2023-04-18 LAB — TSH: TSH: 4.08 u[IU]/mL (ref 0.35–5.50)

## 2023-04-18 LAB — HEMOGLOBIN A1C: Hgb A1c MFr Bld: 5.5 % (ref 4.6–6.5)

## 2023-04-18 MED ORDER — LORAZEPAM 0.5 MG PO TABS: 0.5000 mg | ORAL_TABLET | Freq: Two times a day (BID) | ORAL | 2 refills | Status: DC | PRN

## 2023-04-18 MED ORDER — PANTOPRAZOLE SODIUM 40 MG PO TBEC: 40.0000 mg | DELAYED_RELEASE_TABLET | Freq: Every day | ORAL | 1 refills | Status: DC

## 2023-04-18 MED ORDER — ATORVASTATIN CALCIUM 20 MG PO TABS: 20.0000 mg | ORAL_TABLET | Freq: Every day | ORAL | 1 refills | Status: DC

## 2023-04-18 MED ORDER — TESTOSTERONE 30 MG/ACT TD SOLN
TRANSDERMAL | 1 refills | Status: DC
Start: 2023-04-18 — End: 2023-06-08

## 2023-04-18 MED ORDER — ZOLPIDEM TARTRATE 10 MG PO TABS: 10.0000 mg | ORAL_TABLET | Freq: Every evening | ORAL | 5 refills | Status: DC | PRN

## 2023-04-18 MED ORDER — DUTASTERIDE-TAMSULOSIN HCL 0.5-0.4 MG PO CAPS: 1.0000 | ORAL_CAPSULE | Freq: Every day | ORAL | 1 refills | Status: DC

## 2023-04-18 MED ORDER — AREXVY 120 MCG/0.5ML IM SUSR
0.5000 mL | Freq: Once | INTRAMUSCULAR | 0 refills | Status: AC
  Filled 2023-04-18: qty 0.5, 1d supply, fill #0

## 2023-04-18 MED ORDER — LEVOTHYROXINE SODIUM 100 MCG PO TABS: 100.0000 ug | ORAL_TABLET | Freq: Every day | ORAL | 1 refills | Status: DC

## 2023-04-18 NOTE — Patient Instructions (Addendum)
RSV, Respiratory Syncitial Virus Vaccine, Arexvy vaccine at pharmacy  Annual Covid and flu booster  Tetanus is due at pharmacy

## 2023-04-19 ENCOUNTER — Other Ambulatory Visit: Payer: Self-pay | Admitting: Family Medicine

## 2023-04-19 ENCOUNTER — Encounter: Payer: Self-pay | Admitting: Family Medicine

## 2023-04-19 NOTE — Progress Notes (Signed)
Subjective:    Patient ID: James [name], male    DOB: January 16, 1941, 82 y.o.   MRN: [id number]  Chief Complaint  Patient presents with  . Follow-up    HPI Discussed the use of AI scribe software for clinical note transcription with the patient, who gave verbal consent to proceed.  History of Present Illness   The patient, an 82 year old individual with a history of heart conditions, presents for a routine check-up. He reports occasional lightheadedness, particularly when standing up quickly. The patient attributes this to possible dehydration and admits to not drinking as much water as recommended. He also mentions waking up during the night and using a small dose of Ambien to help him return to sleep. The patient is currently on several medications, including Eliquis, metoprolol, pantoprazole, and testosterone, and reports taking these medications as prescribed. He expresses satisfaction with his current state of health and ability to maintain an active lifestyle.        Past Medical History:  Diagnosis Date  . AAA (abdominal aortic aneurysm) without rupture (HCC) 09/08/2013  . Abdominal bloating 05/19/2016  . Anxiety state 05/18/2014  . Atrial fibrillation (HCC)   . 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 Benign paroxysmal positional vertigo 04/06/2013  . CAD (coronary artery disease)    nonobstructive by cath 2008; nonobstructive by Cardiac CT 11/2011  . Cervical spine fracture (HCC) 1980   C6-7  . Diverticulosis   . Erectile dysfunction 09/08/2013  . GERD (gastroesophageal reflux disease)   . Hiatal hernia   . History of atrial fibrillation   . History of squamous cell carcinoma 10-05-10   Dr Cristopher Estimable, [place]  . HLD (hyperlipidemia)   . Hypothyroidism   . Medicare annual wellness visit, subsequent 03/16/2014   Sees [place] [facility] Follows with Dr Eden Emms of cardiology [facility] Sees GI for colonoscopy, last colonoscopy in 2013   . Mitral valve prolapse    . Neck pain 05/03/2015  . Preventative health care 03/16/2014  . 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 Syncope 02/03/2013  . 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    Past Surgical History:  Procedure Laterality Date  . APPENDECTOMY  05/2011  . CARDIAC CATHETERIZATION  01/03/12   30% ostial LAD stenosis, ostial 30-40% D1 stenosis, smooth and normal left main and RCA; LVEF 65%  . CHOLECYSTECTOMY N/A 08/25/2014   Procedure: LAPAROSCOPIC CHOLECYSTECTOMY WITH INTRAOPERATIVE CHOLANGIOGRAM;  Surgeon: Avel Peace, MD;  [facility];  Service: General;  Laterality: N/A;  . INGUINAL HERNIA REPAIR  07/2010   right  . KNEE ARTHROPLASTY     bilat  . LEFT HEART CATH AND CORONARY ANGIOGRAPHY N/A 10/01/2021   Procedure: LEFT HEART CATH AND CORONARY ANGIOGRAPHY;  Surgeon: Lyn Records, MD;  Location: [facility] INVASIVE CV LAB;  Service: Cardiovascular;  Laterality: N/A;  . LEFT HEART CATHETERIZATION WITH CORONARY ANGIOGRAM N/A 01/03/2012   Procedure: LEFT HEART CATHETERIZATION WITH CORONARY ANGIOGRAM;  Surgeon: Vesta Mixer, MD;  Location: [facility] CATH LAB;  Service: Cardiovascular;  Laterality: N/A;  . MRI  10/31/2016  . TONSILLECTOMY  1947  . tumor removal  1958   "fatty tumor"    Family History  Problem Relation Age of Onset  . Heart disease Mother        MI 27s  . Stroke Mother   . Hypertension Mother   . Heart disease Maternal Grandfather        MI 70-80s  . Heart disease  Maternal Grandmother        MI 39-80s  . Lung disease Father        penumonitis  . Cancer Sister        colon  . Cancer Paternal Uncle        GI: colon he thinks    Social History   Socioeconomic History  . Marital status: Single    Spouse name: Not on file  . Number of children: Not on file  . Years of education: Not on file  . Highest education level: Not on file  Occupational History  . Occupation: Art gallery manager    Comment: Retired  Tobacco Use  . Smoking status: Never  . Smokeless tobacco:  Never  Vaping Use  . Vaping status: Never Used  Substance and Sexual Activity  . Alcohol use: Yes    Alcohol/week: 14.0 standard drinks of alcohol    Types: 14 Glasses of wine per week    Comment: 2 glasses red wine/day  . Drug use: No  . Sexual activity: Yes  Other Topics Concern  . Not on file  Social History Narrative  . Not on file   Social Determinants of Health   Financial Resource Strain: Low Risk  (09/23/2021)   Overall Financial Resource Strain (CARDIA)   . Difficulty of Paying Living Expenses: Not hard at all  Food Insecurity: No Food Insecurity (09/26/2022)   Hunger Vital Sign   . Worried About Programme researcher, broadcasting/film/video in the Last Year: Never true   . Ran Out of Food in the Last Year: Never true  Transportation Needs: No Transportation Needs (09/26/2022)   PRAPARE - Transportation   . Lack of Transportation (Medical): No   . Lack of Transportation (Non-Medical): No  Physical Activity: Sufficiently Active (01/26/2022)   Exercise Vital Sign   . Days of Exercise per Week: 4 days   . Minutes of Exercise per Session: 60 min  Stress: No Stress Concern Present (09/23/2021)   [employer] of Occupational Health - Occupational Stress Questionnaire   . Feeling of Stress : Not at all  Social Connections: Moderately Integrated (01/26/2022)   Social Connection and Isolation Panel [NHANES]   . Frequency of Communication with Friends and Family: Three times a week   . Frequency of Social Gatherings with Friends and Family: Three times a week   . Attends Religious Services: More than 4 times per year   . Active Member of Clubs or Organizations: Yes   . Attends Banker Meetings: 1 to 4 times per year   . Marital Status: Never married  Intimate Partner Violence: Not At Risk (09/26/2022)   Humiliation, Afraid, Rape, and Kick questionnaire   . Fear of Current or Ex-Partner: No   . Emotionally Abused: No   . Physically Abused: No   . Sexually Abused: No    Outpatient  Medications Prior to Visit  Medication Sig Dispense Refill  . amLODipine (NORVASC) 2.5 MG tablet Take 1 tablet (2.5 mg total) by mouth daily. 90 tablet 3  . apixaban (ELIQUIS) 5 MG TABS tablet Take 1 tablet (5 mg total) by mouth 2 (two) times daily. 60 tablet 5  . ASHWAGANDHA PO Take 1 tablet by mouth daily.    . Calcium Carb-Cholecalciferol (CALCIUM 1000 + D PO) Take 1 tablet by mouth 3 (three) times a week.     . COLLAGEN PO Take 1 capsule by mouth every other day. (Chewable - has hyaluronic acid, collagen and biotin)    .  Cyanocobalamin (VITAMIN B 12 PO) Take 3,000 mcg by mouth daily.    . diclofenac Sodium (VOLTAREN) 1 % GEL Apply 4 g topically 4 (four) times daily. To affected joint. (Patient taking differently: Apply 4 g topically as needed. To affected joint.) 100 g 11  . ELDERBERRY PO Take 50 mg by mouth. 4 or 5 times per week    . fluticasone (FLONASE) 50 MCG/ACT nasal spray Place 2 sprays into both nostrils daily. 16 g 6  . guaiFENesin (MUCINEX) 600 MG 12 hr tablet Take 2 tablets (1,200 mg total) by mouth 2 (two) times daily as needed. 30 tablet 0  . Melatonin 10 MG TABS Take 10 mg by mouth daily.    . metoprolol succinate (TOPROL XL) 25 MG 24 hr tablet Take 1 tablet (25 mg total) by mouth daily. 90 tablet 3  . Milk Thistle 1000 MG CAPS Take 1,000 mg by mouth in the morning.    . Multiple Vitamins-Minerals (MULTIVITAMINS THER. W/MINERALS) TABS Take 1 tablet by mouth daily.    . Probiotic Product (PROBIOTIC DAILY PO) Take 1 tablet by mouth daily.    . sildenafil (REVATIO) 20 MG tablet TAKE 1 TO 5 TABLETS BY MOUTH EVERY DAY AS NEEDED 35 tablet 1  . Turmeric (QC TUMERIC COMPLEX) 500 MG CAPS Take 1 tablet by mouth daily.    Marland Kitchen atorvastatin (LIPITOR) 20 MG tablet TAKE 1 TABLET BY MOUTH EVERY DAY FOR CHOLESTEROL 90 tablet 3  . Dutasteride-Tamsulosin HCl 0.5-0.4 MG CAPS TAKE 1 CAPSULE BY MOUTH AT BEDTIME 90 capsule 1  . levothyroxine (SYNTHROID) 100 MCG tablet Take 1 tablet (100 mcg total) by  mouth daily before breakfast. 90 tablet 1  . LORazepam (ATIVAN) 0.5 MG tablet TAKE 1 TABLET BY MOUTH TWICE DAILY AS NEEDED FOR ANXIETY 30 tablet 1  . mirtazapine (REMERON) 15 MG tablet TAKE 1 TABLET BY MOUTH AT BEDTIME 90 tablet 0  . pantoprazole (PROTONIX) 40 MG tablet TAKE 1 TABLET BY MOUTH 2 TIMES A DAY BEFORE MEALS FOR ACID REFLUX 180 tablet 1  . Testosterone 30 MG/ACT SOLN APPLY 1 PUMP TOPICALLY DAILY. FIVE DAYS A WEEK 180 mL 0  . zolpidem (AMBIEN) 10 MG tablet TAKE 1 TABLET BY MOUTH AT BEDTIME AS NEEDED FOR SLEEP 30 tablet 3   No facility-administered medications prior to visit.    Allergies  Allergen Reactions  . Sulfa Antibiotics Other (See Comments)    Childhood allergy    Review of Systems  Constitutional:  Negative for fever and malaise/fatigue.  HENT:  Negative for congestion.   Eyes:  Negative for blurred vision.  Respiratory:  Negative for shortness of breath.   Cardiovascular:  Negative for chest pain, palpitations and leg swelling.  Gastrointestinal:  Negative for abdominal pain, blood in stool and nausea.  Genitourinary:  Negative for dysuria and frequency.  Musculoskeletal:  Negative for falls.  Skin:  Negative for rash.  Neurological:  Positive for dizziness. Negative for loss of consciousness and headaches.  Endo/Heme/Allergies:  Negative for environmental allergies.  Psychiatric/Behavioral:  Negative for depression. The patient is not nervous/anxious.       Objective:    Physical Exam Vitals reviewed.  Constitutional:      Appearance: Normal appearance. He is not ill-appearing.  HENT:     Head: Normocephalic and atraumatic.     Nose: Nose normal.  Eyes:     Conjunctiva/sclera: Conjunctivae normal.  Cardiovascular:     Rate and Rhythm: Normal rate.     Pulses: Normal pulses.  Heart sounds: Normal heart sounds. No murmur heard. Pulmonary:     Effort: Pulmonary effort is normal.     Breath sounds: Normal breath sounds. No wheezing.  Abdominal:      Palpations: Abdomen is soft. There is no mass.     Tenderness: There is no abdominal tenderness.  Musculoskeletal:     Cervical back: Normal range of motion.     Right lower leg: No edema.     Left lower leg: No edema.  Skin:    General: Skin is warm and dry.  Neurological:     General: No focal deficit present.     Mental Status: He is alert and oriented to person, place, and time.  Psychiatric:        Mood and Affect: Mood normal.   BP 110/68 (BP Location: Left Arm, Patient Position: Sitting, Cuff Size: Normal)   Pulse (!) 53   Temp 97.8 F (36.6 C) (Oral)   Resp 16   Ht 5\' 10"  (1.778 m)   Wt 163 lb (73.9 kg)   SpO2 98%   BMI 23.39 kg/m  Wt Readings from Last 3 Encounters:  04/18/23 163 lb (73.9 kg)  03/21/23 162 lb (73.5 kg)  01/05/23 160 lb 14.4 oz (73 kg)    Diabetic Foot Exam - Simple   No data filed    Lab Results  Component Value Date   WBC 6.8 04/18/2023   HGB 14.7 04/18/2023   HCT 43.9 04/18/2023   PLT 226.0 04/18/2023   GLUCOSE 86 04/18/2023   CHOL 130 04/18/2023   TRIG 80.0 04/18/2023   HDL 65.30 04/18/2023   LDLCALC 49 04/18/2023   ALT 18 04/18/2023   AST 24 04/18/2023   NA 134 (L) 04/18/2023   K 4.2 04/18/2023   CL 99 04/18/2023   CREATININE 0.89 04/18/2023   BUN 13 04/18/2023   CO2 29 04/18/2023   TSH 4.08 04/18/2023   PSA 0.40 09/06/2021   INR 1.09 08/21/2014   HGBA1C 5.5 04/18/2023    Lab Results  Component Value Date   TSH 4.08 04/18/2023   Lab Results  Component Value Date   WBC 6.8 04/18/2023   HGB 14.7 04/18/2023   HCT 43.9 04/18/2023   MCV 96.4 04/18/2023   PLT 226.0 04/18/2023   Lab Results  Component Value Date   NA 134 (L) 04/18/2023   K 4.2 04/18/2023   CO2 29 04/18/2023   GLUCOSE 86 04/18/2023   BUN 13 04/18/2023   CREATININE 0.89 04/18/2023   BILITOT 1.0 04/18/2023   ALKPHOS 55 04/18/2023   AST 24 04/18/2023   ALT 18 04/18/2023   PROT 7.0 04/18/2023   ALBUMIN 4.2 04/18/2023   CALCIUM 9.5 04/18/2023    ANIONGAP 11 03/07/2020   EGFR 87 03/21/2023   GFR 79.33 04/18/2023   Lab Results  Component Value Date   CHOL 130 04/18/2023   Lab Results  Component Value Date   HDL 65.30 04/18/2023   Lab Results  Component Value Date   LDLCALC 49 04/18/2023   Lab Results  Component Value Date   TRIG 80.0 04/18/2023   Lab Results  Component Value Date   CHOLHDL 2 04/18/2023   Lab Results  Component Value Date   HGBA1C 5.5 04/18/2023       Assessment & Plan:  Atrial fibrillation, unspecified type (HCC) Assessment & Plan: Rate controlled, tolerating meds   Essential hypertension Assessment & Plan: Well controlled, no changes to meds. Encouraged heart healthy diet such  as the DASH diet and exercise as tolerated.  RSV (respiratory syncitial virus) vaccine at pharmacy, Arexvy Covid booster new version at pharmacy High dose flu shot  Tetanus is due  Orders: -     Testosterone; 1 pump topically to each axilla x 5 days per week  Dispense: 180 mL; Refill: 1 -     Comprehensive metabolic panel -     CBC with Differential/Platelet -     TSH  Hyperglycemia Assessment & Plan: hgba1c acceptable, minimize simple carbs. Increase exercise as tolerated.   Orders: -     Hemoglobin A1c  Hyperlipidemia, unspecified hyperlipidemia type Assessment & Plan: Encourage heart healthy diet such as MIND or DASH diet, increase exercise, avoid trans fats, simple carbohydrates and processed foods, consider a krill or fish or flaxseed oil cap daily. Tolerating Atorvastatin  Orders: -     Lipid panel  Hypothyroidism, unspecified type Assessment & Plan: On Levothyroxine, continue to monitor   Renal insufficiency Assessment & Plan: Supplement and monitor   Low testosterone Assessment & Plan: Supplement and monitor   Orders: -     Testosterone  High risk medication use -     Drug Monitoring Panel [phone] , Urine  Need for influenza vaccination -     Flu Vaccine Trivalent High Dose  (Fluad)  Other orders -     Atorvastatin Calcium; Take 1 tablet (20 mg total) by mouth daily.  Dispense: 90 tablet; Refill: 1 -     Levothyroxine Sodium; Take 1 tablet (100 mcg total) by mouth daily before breakfast.  Dispense: 90 tablet; Refill: 1 -     Dutasteride-Tamsulosin HCl; Take 1 capsule by mouth at bedtime.  Dispense: 90 capsule; Refill: 1 -     Zolpidem Tartrate; Take 1 tablet (10 mg total) by mouth at bedtime as needed. for sleep  Dispense: 30 tablet; Refill: 5 -     Pantoprazole Sodium; Take 1 tablet (40 mg total) by mouth daily.  Dispense: 90 tablet; Refill: 1 -     LORazepam; Take 1 tablet (0.5 mg total) by mouth 2 (two) times daily as needed. for anxiety  Dispense: 30 tablet; Refill: 2    Assessment and Plan    Dehydration Discussed the importance of hydration, especially in the elderly, and its impact on cognitive function and overall health. Patient experiences occasional lightheadedness, likely due to dehydration. -Increase water intake to 10 ounces every 1-2 hours while awake.  Insomnia Patient uses Ambien (Zolpidem) for sleep and reports it helps significantly. Patient also has Mirtazapine but prefers Ambien. -Continue Ambien as needed for sleep. Discontinue Mirtazapine.  Hypertension Managed with Eliquis, Metoprolol, and Amlodipine. -Continue current medications. Refill prescriptions.  Hyperlipidemia Managed with Atorvastatin. -Continue Atorvastatin 20mg  daily. Refill prescription.  Hypothyroidism Managed with Levothyroxine. -Continue Levothyroxine. Refill prescription. Check thyroid levels today.  Gastroesophageal Reflux Disease (GERD) Managed with Pantoprazole. -Continue Pantoprazole once daily. Refill prescription.  Anxiety Occasional use of Lorazepam for stressful moments. -Continue Lorazepam as needed for anxiety. Refill prescription.  Testosterone Replacement Therapy Patient applies one pump of testosterone to each axilla five days a  week. -Continue current regimen. Refill prescription. Check testosterone levels today.  General Health Maintenance / Followup Plans: -Administer high-dose flu vaccine today. -Consider additional vaccines RSV (Orexvy) and annual COVID-19 booster. Patient can receive one of these vaccines today and the other in 2-3 weeks. -Administer Tetanus vaccine in 2-3 weeks. -Schedule follow-up visit in approximately six months.         Danise Edge, MD

## 2023-04-20 ENCOUNTER — Other Ambulatory Visit (HOSPITAL_BASED_OUTPATIENT_CLINIC_OR_DEPARTMENT_OTHER): Payer: Self-pay

## 2023-04-21 LAB — DRUG MONITORING PANEL 376104, URINE
Amphetamines: NEGATIVE ng/mL (ref <500)
Barbiturates: NEGATIVE ng/mL (ref <300)
Benzodiazepines: NEGATIVE ng/mL (ref <100)
Cocaine Metabolite: NEGATIVE ng/mL (ref <150)
Desmethyltramadol: NEGATIVE ng/mL (ref <100)
Opiates: NEGATIVE ng/mL (ref <100)
Oxycodone: NEGATIVE ng/mL (ref <100)
Tramadol: NEGATIVE ng/mL (ref <100)

## 2023-04-21 LAB — DM TEMPLATE

## 2023-05-05 DIAGNOSIS — I952 Hypotension due to drugs: Secondary | ICD-10-CM | POA: Diagnosis not present

## 2023-05-05 DIAGNOSIS — R531 Weakness: Secondary | ICD-10-CM | POA: Diagnosis not present

## 2023-05-05 DIAGNOSIS — R001 Bradycardia, unspecified: Secondary | ICD-10-CM | POA: Diagnosis not present

## 2023-05-05 DIAGNOSIS — T461X5A Adverse effect of calcium-channel blockers, initial encounter: Secondary | ICD-10-CM | POA: Diagnosis not present

## 2023-05-05 DIAGNOSIS — I4891 Unspecified atrial fibrillation: Secondary | ICD-10-CM | POA: Diagnosis not present

## 2023-05-05 DIAGNOSIS — R61 Generalized hyperhidrosis: Secondary | ICD-10-CM | POA: Diagnosis not present

## 2023-05-05 DIAGNOSIS — I959 Hypotension, unspecified: Secondary | ICD-10-CM | POA: Diagnosis not present

## 2023-05-05 DIAGNOSIS — E871 Hypo-osmolality and hyponatremia: Secondary | ICD-10-CM | POA: Diagnosis not present

## 2023-05-06 DIAGNOSIS — R001 Bradycardia, unspecified: Secondary | ICD-10-CM | POA: Diagnosis not present

## 2023-05-06 LAB — LAB REPORT - SCANNED: EGFR: 90

## 2023-06-02 ENCOUNTER — Other Ambulatory Visit: Payer: Self-pay | Admitting: Internal Medicine

## 2023-06-02 ENCOUNTER — Other Ambulatory Visit: Payer: Self-pay | Admitting: Family Medicine

## 2023-06-02 NOTE — Telephone Encounter (Signed)
Prescription refill request for Eliquis received. Indication:AFIB Last office visit:10/24 Scr:0.72  11/24 Age:82  Weight:73.9  kg  Prescription refilled

## 2023-06-08 ENCOUNTER — Other Ambulatory Visit: Payer: Self-pay | Admitting: Family Medicine

## 2023-06-08 DIAGNOSIS — I10 Essential (primary) hypertension: Secondary | ICD-10-CM

## 2023-06-08 NOTE — Telephone Encounter (Signed)
Requesting: testosterone 30mg /act soln Contract: n/a UDS: n/a Last Visit: 04/18/23 Next Visit: 10/16/23 Last Refill: 04/18/23 #180 mL and 1RF   Please Advise

## 2023-06-19 ENCOUNTER — Other Ambulatory Visit: Payer: Self-pay | Admitting: Family Medicine

## 2023-06-23 ENCOUNTER — Ambulatory Visit: Payer: Self-pay | Admitting: Family Medicine

## 2023-06-23 NOTE — Telephone Encounter (Signed)
 Copied from CRM [id number]. Topic: Clinical - Medication Refill >> Jun 23, 2023 11:02 AM Isabell A wrote: Most Recent Primary Care Visit:  Provider: DOMENICA BLACKBIRD A  Department: [facility]  Visit Type: OFFICE VISIT  Date: 04/18/2023  Medication: Mirtazapine   Has the patient contacted their pharmacy? Yes (Agent: If no, request that the patient contact the pharmacy for the refill. If patient does not wish to contact the pharmacy document the reason why and proceed with request.) (Agent: If yes, when and what did the pharmacy advise?) Sent a fax to provide that was denied.  Is this the correct pharmacy for this prescription? Yes If no, delete pharmacy and type the correct one.  This is the patient's preferred pharmacy:  [facility] - [place], [place] - [address] [place] [address] Phone: [phone] Fax: [phone]  Has the prescription been filled recently? Yes  Is the patient out of the medication? Yes  Has the patient been seen for an appointment in the last year OR does the patient have an upcoming appointment? Yes  Can we respond through MyChart? No  Agent: Please be advised that Rx refills may take up to 3 business days. We ask that you follow-up with your pharmacy.

## 2023-07-03 ENCOUNTER — Other Ambulatory Visit: Payer: Self-pay | Admitting: Family Medicine

## 2023-07-13 ENCOUNTER — Telehealth: Payer: Self-pay

## 2023-07-13 NOTE — Telephone Encounter (Signed)
Copied from CRM [id number]. Topic: Clinical - Prescription Issue >> Jul 13, 2023 11:16 AM Tiffany H wrote: Reason for CRM: Patient called to request refill for Mirtazapine. Per 04/18/23 Dr. Vena Rua office visit notes indicated that Mirtazapine was to be discontinued. Patient advised that he's been taking this medication for a few decades and has some questions about discontinuing. Patient advised that he has a few pills left. Patient would like to know how to ween off of the medication. Will there be a phase-out period or is he supposed to be abruptly stop taking this medication. Please assist.

## 2023-07-18 ENCOUNTER — Other Ambulatory Visit: Payer: Self-pay | Admitting: Family Medicine

## 2023-07-18 MED ORDER — MIRTAZAPINE 15 MG PO TABS: 15.0000 mg | ORAL_TABLET | Freq: Every day | ORAL | 1 refills | Status: DC

## 2023-07-19 NOTE — Telephone Encounter (Signed)
Called patient and he said he would like to wait and discuss it at his April appointment.

## 2023-08-01 ENCOUNTER — Other Ambulatory Visit: Payer: Self-pay | Admitting: Family Medicine

## 2023-08-29 ENCOUNTER — Other Ambulatory Visit: Payer: Self-pay | Admitting: Family Medicine

## 2023-09-05 NOTE — Progress Notes (Deleted)
 Cardiology Office Note:   Date:  09/05/2023  ID:  James [name], DOB [birth year], MRN [id number] PCP:  Bradd Canary, MD  [facility] Providers Cardiologist:  Alverda Skeans, MD Referring MD: Bradd Canary, MD  Chief Complaint/Reason for Referral: Chest pain, paroxysmal atrial fibrillation, hyperlipidemia follow-up ASSESSMENT:    1. Precordial chest pain   2. Mild CAD   3. Hyperlipidemia LDL goal <70   4. Primary hypertension   5. Paroxysmal atrial fibrillation (HCC)   6. 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 Dilation of aorta (HCC)   8. Bilateral carotid artery stenosis     PLAN:   In order of problems listed above: Precordial chest pain: Seems more consistent with vasospasm.  Continue amlodipine 2.5 mg daily. Mild coronary artery disease:  Continue Eliquis 5 mg twice daily, atorvastatin 20 mg, and strict blood pressure control. Hyperlipidemia: Check lipid panel, LFTs today; defer LP(a) testing in this 83 year old gentleman. Hypertension: Continue Toprol-XL 25 mg daily and amlodipine 2.5 mg daily.*** Paroxysmal atrial fibrillation: Continue Eliquis 5 mg twice daily and Toprol XL 25 mg daily Secondary hypercoagulable state: Continue Eliquis 5 mg twice daily. Aortic dilatation: Will obtain CTA of chest to evaluate further.*** Bilateral carotid artery stenoses: Continue Eliquis 5 mg twice daily, atorvastatin 20 mg daily, and strict blood pressure control.       {Are you ordering a CV Procedure (e.g. stress test, cath, DCCV, TEE, etc)?   Press F2        :[id number]}   Dispo:  No follow-ups on file.      Medication Adjustments/Labs and Tests Ordered: Current medicines are reviewed at length with the patient today.  Concerns regarding medicines are outlined above.  The following changes have been made:  {PLAN; NO CHANGE:13088:s}   Labs/tests ordered: No orders of the defined types were placed in this encounter.   Medication Changes: No  orders of the defined types were placed in this encounter.   Current medicines are reviewed at length with the patient today.  The patient {ACTIONS; HAS/DOES NOT HAVE:19233} concerns regarding medicines.  I spent *** minutes reviewing all clinical data during and prior to this visit including all relevant imaging studies, laboratories, clinical information from other health systems and prior notes from both Cardiology and other specialties, interviewing the patient, conducting a complete physical examination, and coordinating care in order to formulate a comprehensive and personalized evaluation and treatment plan.   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 Nelly Laurence Paroxysmal atrial fibrillation  Monitor 12/2022: NSR, NSVT, SVT runs (13 beats); A-fib <1% CV 2 score of 2 MV Prolapse  TTE 10/26/22: EF 55-60, no RWMA, mild LVH, GR 1 DD, normal RVSF, RVSP 24.9, trivial MR, mild AI, ascending aorta 41 mm, RAP 3 Hyperlipidemia  Hypertension  Carotid artery disease [place] 04/19/2019: Bilateral ICA 1-39 Abdominal aortic dilatation [place] in 2016: 3 cm [place] in 03/2020: 2.6 cm (rec to repeat in 2-3 years) Lightheadedness  June 2024: The patient is a 83 y.o. male with the indicated medical history here for that I did doctor of the day appointment.  The patient is normally seen by Dr. Shari Prows.  The patient has a long history of lightheadedness with intermittent chest discomfort.   He has undergone a number of cardiac procedures most notably coronary angiography last year which demonstrated mild disease, cardiac MRI which showed normal function with borderline RV systolic function, and exercise treadmill stress test which showed atrial tachycardia, cardiac monitor which demonstrated occasional PACs and PVCs supraventricular tachycardia, and echocardiography which confirmed normal LV function with only mild aortic insufficiency.  He was seen by Dr. Shari Prows in October 2023 and was doing fairly well.  His metoprolol 12.5 mg twice daily was continued.  His Imdur was stopped.  He had seen EP for recommendations and they suggested continue medical therapy.   The patient is a competitive speed walker.  He tells me that on occasion he will develop some chest pain when he starts walking and will have to slow down a bit.  Chest pain will improve.  He will complete his walk.  After he stops he will get chest pain and lightheaded at times.  He occasionally gets chest pain at rest on occasion as well.  He tells me that sometimes when he gets lightheaded he has not eaten anything prior to doing his 2 mile walks.  He fortunately has not had any frank syncope.  Plan: Obtain monitor due to presyncope, refer for PET stress test to evaluate for microvascular dysfunction.  March 2025: Patient consents to use of AI scribe.*** In the interim the patient's PET stress test demonstrated no evidence of coronary microvascular dysfunction.  His monitor demonstrated atrial fibrillation and he was started on Eliquis and metoprolol.  He is referred to atrial fibrillation clinic.  He was seen in October of this year and was doing well.  His chest pain symptoms had improved with Imdur.  However due to as needed sildenafil this was changed to amlodipine 2.5 mg.          Current Medications: No outpatient medications have been marked as taking for the 09/15/23 encounter (Appointment) with Orbie Pyo, MD.      Review of Systems:   Please see the history of present illness.    All other systems reviewed and are negative.     EKGs/Labs/Other Test Reviewed:   EKG: EKG from 2024 demonstrates sinus bradycardia  EKG Interpretation Date/Time:    Ventricular Rate:    PR Interval:    QRS Duration:    QT Interval:    QTC Calculation:   R Axis:      Text Interpretation:           Risk Assessment/Calculations:   {Does this patient have ATRIAL FIBRILLATION?:[phone]}      Physical Exam:   VS:  There were no vitals taken for this visit.   No BP recorded.  {Refresh Note OR Click here to enter BP  :1}***   Wt Readings from Last 3 Encounters:  04/18/23 163 lb (73.9 kg)  03/21/23 162 lb (73.5 kg)  01/05/23 160 lb 14.4 oz (73 kg)      GENERAL:  No apparent distress, AOx3 HEENT:  No carotid bruits, +2 carotid impulses, no scleral icterus CAR: RRR Irregular RR*** no murmurs***, gallops, rubs, or thrills RES:  Clear to auscultation bilaterally ABD:  Soft, nontender, nondistended, positive bowel sounds x 4 VASC:  +2 radial pulses, +2  carotid pulses NEURO:  CN 2-12 grossly intact; motor and sensory grossly intact PSYCH:  No active depression or anxiety EXT:  No edema, ecchymosis, or cyanosis  Signed, Orbie Pyo, MD  09/05/2023 2:43 PM    [facility] [address] [place], [place], [place]  [address] Phone: [phone]; Fax: [phone]   Note:  This document was prepared using Dragon voice recognition software and may include unintentional dictation errors.

## 2023-09-15 ENCOUNTER — Ambulatory Visit: Payer: Medicare Other | Attending: Internal Medicine | Admitting: Internal Medicine

## 2023-09-15 DIAGNOSIS — R072 Precordial pain: Secondary | ICD-10-CM

## 2023-09-15 DIAGNOSIS — I77819 Aortic ectasia, unspecified site: Secondary | ICD-10-CM

## 2023-09-15 DIAGNOSIS — E785 Hyperlipidemia, unspecified: Secondary | ICD-10-CM

## 2023-09-15 DIAGNOSIS — D6869 Other thrombophilia: Secondary | ICD-10-CM

## 2023-09-15 DIAGNOSIS — I2510 Atherosclerotic heart disease of native coronary artery without angina pectoris: Secondary | ICD-10-CM

## 2023-09-15 DIAGNOSIS — I6523 Occlusion and stenosis of bilateral carotid arteries: Secondary | ICD-10-CM

## 2023-09-15 DIAGNOSIS — I10 Essential (primary) hypertension: Secondary | ICD-10-CM

## 2023-09-15 DIAGNOSIS — I480 Paroxysmal atrial fibrillation: Secondary | ICD-10-CM

## 2023-09-18 ENCOUNTER — Encounter: Payer: Self-pay | Admitting: Internal Medicine

## 2023-09-19 DIAGNOSIS — L820 Inflamed seborrheic keratosis: Secondary | ICD-10-CM | POA: Diagnosis not present

## 2023-09-19 DIAGNOSIS — D1801 Hemangioma of skin and subcutaneous tissue: Secondary | ICD-10-CM | POA: Diagnosis not present

## 2023-09-19 DIAGNOSIS — L821 Other seborrheic keratosis: Secondary | ICD-10-CM | POA: Diagnosis not present

## 2023-09-19 DIAGNOSIS — L853 Xerosis cutis: Secondary | ICD-10-CM | POA: Diagnosis not present

## 2023-09-28 DIAGNOSIS — R110 Nausea: Secondary | ICD-10-CM | POA: Diagnosis not present

## 2023-09-28 DIAGNOSIS — E871 Hypo-osmolality and hyponatremia: Secondary | ICD-10-CM | POA: Diagnosis not present

## 2023-09-28 DIAGNOSIS — Z79899 Other long term (current) drug therapy: Secondary | ICD-10-CM | POA: Diagnosis not present

## 2023-09-28 DIAGNOSIS — G319 Degenerative disease of nervous system, unspecified: Secondary | ICD-10-CM | POA: Diagnosis not present

## 2023-09-28 DIAGNOSIS — R112 Nausea with vomiting, unspecified: Secondary | ICD-10-CM | POA: Diagnosis not present

## 2023-09-28 DIAGNOSIS — I672 Cerebral atherosclerosis: Secondary | ICD-10-CM | POA: Diagnosis not present

## 2023-09-28 DIAGNOSIS — E2740 Unspecified adrenocortical insufficiency: Secondary | ICD-10-CM | POA: Diagnosis not present

## 2023-09-28 DIAGNOSIS — R531 Weakness: Secondary | ICD-10-CM | POA: Diagnosis not present

## 2023-09-28 DIAGNOSIS — R001 Bradycardia, unspecified: Secondary | ICD-10-CM | POA: Diagnosis not present

## 2023-09-28 DIAGNOSIS — R9089 Other abnormal findings on diagnostic imaging of central nervous system: Secondary | ICD-10-CM | POA: Diagnosis not present

## 2023-09-28 DIAGNOSIS — Z20822 Contact with and (suspected) exposure to covid-19: Secondary | ICD-10-CM | POA: Diagnosis not present

## 2023-09-28 DIAGNOSIS — T381X5A Adverse effect of thyroid hormones and substitutes, initial encounter: Secondary | ICD-10-CM | POA: Diagnosis not present

## 2023-09-28 DIAGNOSIS — R55 Syncope and collapse: Secondary | ICD-10-CM | POA: Diagnosis not present

## 2023-09-28 DIAGNOSIS — R1111 Vomiting without nausea: Secondary | ICD-10-CM | POA: Diagnosis not present

## 2023-09-28 DIAGNOSIS — E875 Hyperkalemia: Secondary | ICD-10-CM | POA: Diagnosis not present

## 2023-09-28 DIAGNOSIS — I6782 Cerebral ischemia: Secondary | ICD-10-CM | POA: Diagnosis not present

## 2023-09-28 DIAGNOSIS — E042 Nontoxic multinodular goiter: Secondary | ICD-10-CM | POA: Diagnosis not present

## 2023-09-28 DIAGNOSIS — R4182 Altered mental status, unspecified: Secondary | ICD-10-CM | POA: Diagnosis not present

## 2023-09-28 DIAGNOSIS — E039 Hypothyroidism, unspecified: Secondary | ICD-10-CM | POA: Diagnosis not present

## 2023-09-28 DIAGNOSIS — R42 Dizziness and giddiness: Secondary | ICD-10-CM | POA: Diagnosis not present

## 2023-09-28 DIAGNOSIS — Z0389 Encounter for observation for other suspected diseases and conditions ruled out: Secondary | ICD-10-CM | POA: Diagnosis not present

## 2023-09-28 DIAGNOSIS — Z79890 Hormone replacement therapy (postmenopausal): Secondary | ICD-10-CM | POA: Diagnosis not present

## 2023-10-02 ENCOUNTER — Telehealth: Payer: Self-pay | Admitting: Family Medicine

## 2023-10-02 ENCOUNTER — Telehealth: Payer: Self-pay

## 2023-10-02 NOTE — Telephone Encounter (Unsigned)
 Copied from CRM [id number]. Topic: Clinical - Medical Advice >> Oct 02, 2023 11:28 AM Danae Duncans wrote: Reason for CRM: Patient Sister called and advise on 04/10 - Ruari feeling weak, dizzy , foggy headed, nausea, vomitting with blood , transported [facility]- completed lung xray, CT Scan, Mri, Numerous Blood work, blood work included proponin and cortisol test , IV magnesium, stopped amlodipine , started him on neclanize, No diagnosis - released on 04/11, Appt on 04/28 - Still feel weak, dizzy , declined nurse triage, sister notice on bloodwork tsh- .171 - Wanted to know if thyroid medication needed to adjust before appt or any additional treatment need to be done before appt

## 2023-10-02 NOTE — Transitions of Care (Post Inpatient/ED Visit) (Signed)
 10/02/2023  Name: James [name] MRN: [id number] DOB: 04-29-1941  Today's TOC FU Call Status: Today's TOC FU Call Status:: Successful TOC FU Call Completed TOC FU Call Complete Date: 10/02/23 Patient's Name and Date of Birth confirmed.  Transition Care Management Follow-up Telephone Call Date of Discharge: 09/29/23 Discharge Facility: [facility] Type of Discharge: Inpatient Admission Primary Inpatient Discharge Diagnosis:: dizziness How have you been since you were released from the hospital?: Better Any questions or concerns?: No  Items Reviewed: Did you receive and understand the discharge instructions provided?: Yes Medications obtained,verified, and reconciled?: Yes (Medications Reviewed) Any new allergies since your discharge?: No Dietary orders reviewed?: Yes Do you have support at home?: No  Medications Reviewed Today: Medications Reviewed Today     Reviewed by Karena Addison, LPN (Licensed Practical Nurse) on 10/02/23 at 1404  Med List Status: <None>   Medication Order Taking? Sig Documenting Provider Last Dose Status Informant  amLODipine (NORVASC) 2.5 MG tablet [id number] No Take 1 tablet (2.5 mg total) by mouth daily.  Patient not taking: Reported on 10/02/2023   James Lecher, PA-C Not Taking Expired 06/19/23 2359   ASHWAGANDHA PO [id number] Yes Take 1 tablet by mouth daily. [provider] Taking Active Self  atorvastatin (LIPITOR) 20 MG tablet [id number] Yes Take 1 tablet (20 mg total) by mouth daily. Bradd Canary, MD Taking Active   Calcium Carb-Cholecalciferol (CALCIUM 1000 + D PO) [id number] Yes Take 1 tablet by mouth 3 (three) times a week.  [provider] Taking Active Self  COLLAGEN PO [id number] Yes Take 1 capsule by mouth every other day. (Chewable - has hyaluronic acid, collagen and biotin) [provider] Taking Active   Cyanocobalamin (VITAMIN B 12 PO) [id number] Yes Take 3,000 mcg by mouth daily. [provider] Taking Active Self  diclofenac Sodium (VOLTAREN) 1 % GEL [id number] Yes Apply 4 g topically 4 (four) times daily. To affected joint.  Patient taking differently: Apply 4 g topically as needed. To affected joint.   James Rude, MD Taking Active Self  Dutasteride-Tamsulosin HCl 0.5-0.4 MG CAPS [id number] Yes Take 1 capsule by mouth at bedtime. Bradd Canary, MD Taking Active   ELDERBERRY PO [id number] Yes Take 50 mg by mouth. 4 or 5 times per week [provider] Taking Active Self  ELIQUIS 5 MG TABS tablet [id number] Yes TAKE 1 TABLET BY MOUTH 2 TIMES A DAY James Pyo, MD Taking Active   fluticasone (FLONASE) 50 MCG/ACT nasal spray [id number] Yes Place 2 sprays into both nostrils daily. [name], James A, NP Taking Active   guaiFENesin (MUCINEX) 600 MG 12 hr tablet [id number] Yes Take 2 tablets (1,200 mg total) by mouth 2 (two) times daily as needed. James Dana, NP Taking Active   levothyroxine (SYNTHROID) 100 MCG tablet [id number] Yes TAKE 1 TABLET BY MOUTH EVERY MORNING BEFORE BREAKFAST Bradd Canary, MD Taking Active   LORazepam (ATIVAN) 0.5 MG tablet [id number] Yes Take 1 tablet (0.5 mg total) by mouth 2 (two) times daily as needed. for anxiety Bradd Canary, MD Taking Active   Melatonin 10 MG TABS [id number] Yes Take 10 mg by mouth daily. [provider] Taking Active Self  metoprolol succinate (TOPROL XL) 25 MG 24 hr tablet [id number] Yes Take 1 tablet (25 mg total) by mouth daily. James Pyo, MD Taking Active   Milk Thistle 1000 MG CAPS [id number] Yes Take 1,000 mg by mouth in the morning. [provider] Taking Active  Self  mirtazapine (REMERON) 15 MG tablet [id number] Yes Take 1 tablet (15 mg total) by mouth at bedtime. James Balk, MD Taking Active   Multiple Vitamins-Minerals (MULTIVITAMINS THER. W/MINERALS) James [name] [id number] Yes Take 1 tablet by mouth daily. [provider] Taking Active Self  pantoprazole (PROTONIX) 40 MG  tablet [id number] Yes Take 1 tablet (40 mg total) by mouth 2 (two) times daily before a meal. James Balk, MD Taking Active   Probiotic Product (PROBIOTIC DAILY PO) [id number] Yes Take 1 tablet by mouth daily. [provider] Taking Active Self  sildenafil (REVATIO) 20 MG tablet [id number] Yes TAKE 1 TO 5 TABLETS BY MOUTH EVERY DAY AS NEEDED James Balk, MD Taking Active   Testosterone 30 MG/ACT SOLN [id number] Yes APPLY 2 PUMPS TOPICALLY EVERY DAY AS DIRECTED James Balk, MD Taking Active   Turmeric (QC TUMERIC COMPLEX) 500 MG CAPS [id number] Yes Take 1 tablet by mouth daily. [provider] Taking Active   zolpidem (AMBIEN) 10 MG tablet [id number] Yes Take 1 tablet (10 mg total) by mouth at bedtime as needed. for sleep James Balk, MD Taking Active             Home Care and Equipment/Supplies: Were Home Health Services Ordered?: NA Any new equipment or medical supplies ordered?: NA  Functional Questionnaire: Do you need assistance with bathing/showering or dressing?: No Do you need assistance with meal preparation?: No Do you need assistance with eating?: No Do you have difficulty maintaining continence: No Do you need assistance with getting out of bed/getting out of a chair/moving?: No Do you have difficulty managing or taking your medications?: No  Follow up appointments reviewed: PCP Follow-up appointment confirmed?: Yes Date of PCP follow-up appointment?: 10/16/23 Follow-up Provider: [facility] Follow-up appointment confirmed?: No Reason Specialist Follow-Up Not Confirmed: Patient has Specialist Provider Number and will Call for Appointment Do you need transportation to your follow-up appointment?: No Do you understand care options if your condition(s) worsen?: Yes-patient verbalized understanding    SIGNATURE Darrall Ellison, LPN [facility] Nurse Health Advisor Direct Dial [phone]

## 2023-10-03 ENCOUNTER — Ambulatory Visit (INDEPENDENT_AMBULATORY_CARE_PROVIDER_SITE_OTHER): Payer: Medicare Other

## 2023-10-03 ENCOUNTER — Encounter: Payer: Self-pay | Admitting: Family Medicine

## 2023-10-03 VITALS — Ht 70.0 in | Wt 160.0 lb

## 2023-10-03 DIAGNOSIS — Z0000 Encounter for general adult medical examination without abnormal findings: Secondary | ICD-10-CM

## 2023-10-03 NOTE — Patient Instructions (Addendum)
 James [name] , Thank you for taking time to come for your Medicare Wellness Visit. I appreciate your ongoing commitment to your health goals. Please review the following plan we discussed and let me know if I can assist you in the future.   Referrals/Orders/Follow-Ups/Clinician Recommendations: You have been referred to [facility] for a complete eye exam. If you haven't heard from them in a few days, please call them to schedule your appointment.  [facility] [address] [place] [address] [place] [address] Phone: [phone]   This is a list of the screening recommended for you and due dates:  Health Maintenance  Topic Date Due   COVID-19 Vaccine (4 - 2024-25 season) 02/19/2023   Flu Shot  01/19/2024   Medicare Annual Wellness Visit  10/02/2024   Pneumonia Vaccine  Completed   Zoster (Shingles) Vaccine  Completed   HPV Vaccine  Aged Out   Meningitis B Vaccine  Aged Out   DTaP/Tdap/Td vaccine  Discontinued    Advanced directives: (Declined) Advance directive discussed with you today. Even though you declined this today, please call our office should you change your mind, and we can give you the proper paperwork for you to fill out.  Next Medicare Annual Wellness Visit scheduled for next year: Yes

## 2023-10-03 NOTE — Progress Notes (Signed)
 Subjective:   James [name] is a 83 y.o. who presents for a Medicare Wellness preventive visit.  Visit Complete: Virtual I connected with  James [name] on 10/03/23 by a audio enabled telemedicine application and verified that I am speaking with the correct person using two identifiers.  Patient Location: Home  Provider Location: Home Office  I discussed the limitations of evaluation and management by telemedicine. The patient expressed understanding and agreed to proceed.  Vital Signs: Because this visit was a virtual/telehealth visit, some criteria may be missing or patient reported. Any vitals not documented were not able to be obtained and vitals that have been documented are patient reported.    Persons Participating in Visit: Patient.  AWV Questionnaire: No: Patient Medicare AWV questionnaire was not completed prior to this visit.  Cardiac Risk Factors include: advanced age (>49men, >57 women);male gender;hypertension     Objective:    Today's Vitals   10/03/23 1553  Weight: 160 lb (72.6 kg)  Height: 5\' 10"  (1.778 m)   Body mass index is 22.96 kg/m.     10/03/2023    4:00 PM 10/01/2021    7:20 AM 09/23/2021   11:04 AM 09/21/2021    1:27 PM 07/09/2020    3:55 PM 06/29/2020    2:11 PM 03/07/2020   11:52 AM  Advanced Directives  Does Patient Have a Medical Advance Directive? No No No No No No No  Would patient like information on creating a medical advance directive? No - Patient declined No - Patient declined  Yes (MAU/Ambulatory/Procedural Areas - Information given) No - Patient declined No - Patient declined     Current Medications (verified) Outpatient Encounter Medications as of 10/03/2023  Medication Sig   amLODipine (NORVASC) 2.5 MG tablet Take 1 tablet (2.5 mg total) by mouth daily. (Patient not taking: Reported on 10/02/2023)   ASHWAGANDHA PO Take 1 tablet by mouth daily.   atorvastatin (LIPITOR) 20 MG tablet Take 1 tablet (20 mg total) by mouth daily.    Calcium Carb-Cholecalciferol (CALCIUM 1000 + D PO) Take 1 tablet by mouth 3 (three) times a week.    COLLAGEN PO Take 1 capsule by mouth every other day. (Chewable - has hyaluronic acid, collagen and biotin)   Cyanocobalamin (VITAMIN B 12 PO) Take 3,000 mcg by mouth daily.   diclofenac Sodium (VOLTAREN) 1 % GEL Apply 4 g topically 4 (four) times daily. To affected joint. (Patient taking differently: Apply 4 g topically as needed. To affected joint.)   Dutasteride-Tamsulosin HCl 0.5-0.4 MG CAPS Take 1 capsule by mouth at bedtime.   ELDERBERRY PO Take 50 mg by mouth. 4 or 5 times per week   ELIQUIS 5 MG TABS tablet TAKE 1 TABLET BY MOUTH 2 TIMES A DAY   fluticasone (FLONASE) 50 MCG/ACT nasal spray Place 2 sprays into both nostrils daily.   guaiFENesin (MUCINEX) 600 MG 12 hr tablet Take 2 tablets (1,200 mg total) by mouth 2 (two) times daily as needed.   levothyroxine (SYNTHROID) 100 MCG tablet TAKE 1 TABLET BY MOUTH EVERY MORNING BEFORE BREAKFAST   LORazepam (ATIVAN) 0.5 MG tablet Take 1 tablet (0.5 mg total) by mouth 2 (two) times daily as needed. for anxiety   Melatonin 10 MG TABS Take 10 mg by mouth daily.   metoprolol succinate (TOPROL XL) 25 MG 24 hr tablet Take 1 tablet (25 mg total) by mouth daily.   Milk Thistle 1000 MG CAPS Take 1,000 mg by mouth in the morning.  mirtazapine (REMERON) 15 MG tablet Take 1 tablet (15 mg total) by mouth at bedtime.   Multiple Vitamins-Minerals (MULTIVITAMINS THER. W/MINERALS) TABS Take 1 tablet by mouth daily.   pantoprazole (PROTONIX) 40 MG tablet Take 1 tablet (40 mg total) by mouth 2 (two) times daily before a meal.   Probiotic Product (PROBIOTIC DAILY PO) Take 1 tablet by mouth daily.   sildenafil (REVATIO) 20 MG tablet TAKE 1 TO 5 TABLETS BY MOUTH EVERY DAY AS NEEDED   Testosterone 30 MG/ACT SOLN APPLY 2 PUMPS TOPICALLY EVERY DAY AS DIRECTED   Turmeric (QC TUMERIC COMPLEX) 500 MG CAPS Take 1 tablet by mouth daily.   zolpidem (AMBIEN) 10 MG tablet Take  1 tablet (10 mg total) by mouth at bedtime as needed. for sleep   No facility-administered encounter medications on file as of 10/03/2023.    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   Benign paroxysmal positional vertigo 04/06/2013   CAD (coronary artery disease)    nonobstructive by cath 2008; nonobstructive by Cardiac CT 11/2011   Cervical spine fracture (HCC) 1980   C6-7   Diverticulosis    Erectile dysfunction 09/08/2013   GERD (gastroesophageal reflux disease)    Hiatal hernia    History of atrial fibrillation    History of squamous cell carcinoma 10-05-10   Dr Tyler Gallant, [place]    HLD (hyperlipidemia)    Hypothyroidism    Medicare annual wellness visit, subsequent 03/16/2014   Sees [facility] Follows with Dr Stann Earnest of cardiology Sees [facility] Sees GI for colonoscopy, last colonoscopy in 2013    Mitral valve prolapse    Neck pain 05/03/2015   Preventative health care 03/16/2014   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   s/p excision   Syncope 02/03/2013   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   Past Surgical History:  Procedure Laterality Date   APPENDECTOMY  05/2011   CARDIAC CATHETERIZATION  01/03/12   30% ostial LAD stenosis, ostial 30-40% D1 stenosis, smooth and normal left main and RCA; LVEF 65%   CHOLECYSTECTOMY N/A 08/25/2014   Procedure: LAPAROSCOPIC CHOLECYSTECTOMY WITH INTRAOPERATIVE CHOLANGIOGRAM;  Surgeon: Adalberto Hollow, MD;  Location: [facility] OR;  Service: General;  Laterality: N/A;   INGUINAL HERNIA REPAIR  07/2010   right   KNEE ARTHROPLASTY     bilat   LEFT HEART CATH AND CORONARY ANGIOGRAPHY N/A 10/01/2021   Procedure: LEFT HEART CATH AND CORONARY ANGIOGRAPHY;  Surgeon: Arty Binning, MD;  Location: [facility]  INVASIVE CV LAB;  Service: Cardiovascular;  Laterality: N/A;   LEFT HEART CATHETERIZATION WITH CORONARY ANGIOGRAM N/A 01/03/2012   Procedure: LEFT HEART CATHETERIZATION WITH CORONARY ANGIOGRAM;  Surgeon: Lake Pilgrim, MD;  Location: [facility] CATH LAB;  Service: Cardiovascular;  Laterality: N/A;   MRI  10/31/2016   TONSILLECTOMY  1947   tumor removal  1958   "fatty tumor"   Family History  Problem Relation Age of Onset   Heart disease Mother        MI 42s   Stroke Mother    Hypertension Mother    Heart disease Maternal Grandfather        MI 13-80s   Heart disease Maternal Grandmother        MI 9-80s   Lung disease Father  penumonitis   Cancer Sister        colon   Cancer Paternal Uncle        GI: colon he thinks   Social History   Socioeconomic History   Marital status: Single    Spouse name: Not on file   Number of children: Not on file   Years of education: Not on file   Highest education level: Not on file  Occupational History   Occupation: Art gallery manager    Comment: Retired  Tobacco Use   Smoking status: Never   Smokeless tobacco: Never  Vaping Use   Vaping status: Never Used  Substance and Sexual Activity   Alcohol use: Yes    Alcohol/week: 14.0 standard drinks of alcohol    Types: 14 Glasses of wine per week    Comment: 2 glasses red wine/day   Drug use: No   Sexual activity: Yes  Other Topics Concern   Not on file  Social History Narrative   Not on file   Social Drivers of Health   Financial Resource Strain: Low Risk  (10/03/2023)   Overall Financial Resource Strain (CARDIA)    Difficulty of Paying Living Expenses: Not hard at all  Food Insecurity: No Food Insecurity (10/03/2023)   Hunger Vital Sign    Worried About Running Out of Food in the Last Year: Never true    Ran Out of Food in the Last Year: Never true  Transportation Needs: No Transportation Needs (10/03/2023)   PRAPARE - Administrator, Civil Service (Medical): No    Lack of  Transportation (Non-Medical): No  Physical Activity: Sufficiently Active (10/03/2023)   Exercise Vital Sign    Days of Exercise per Week: 4 days    Minutes of Exercise per Session: 60 min  Stress: No Stress Concern Present (10/03/2023)   [employer] of Occupational Health - Occupational Stress Questionnaire    Feeling of Stress : Not at all  Social Connections: Moderately Integrated (10/03/2023)   Social Connection and Isolation Panel [NHANES]    Frequency of Communication with Friends and Family: More than three times a week    Frequency of Social Gatherings with Friends and Family: More than three times a week    Attends Religious Services: More than 4 times per year    Active Member of [employer] or Organizations: Yes    Attends Engineer, structural: More than 4 times per year    Marital Status: Divorced    Tobacco Counseling Counseling given: Not Answered    Clinical Intake:  Pre-visit preparation completed: Yes  Pain : No/denies pain     BMI - recorded: 22.96 Nutritional Status: BMI of 19-24  Normal Nutritional Risks: None Diabetes: No  Lab Results  Component Value Date   HGBA1C 5.5 04/18/2023   HGBA1C 5.5 04/18/2022     How often do you need to have someone help you when you read instructions, pamphlets, or other written materials from your doctor or pharmacy?: 1 - Never  Interpreter Needed?: No  Information entered by :: Farris Hong LPN   Activities of Daily Living     10/03/2023    3:59 PM  In your present state of health, do you have any difficulty performing the following activities:  Hearing? 0  Vision? 0  Difficulty concentrating or making decisions? 0  Walking or climbing stairs? 0  Dressing or bathing? 0  Doing errands, shopping? 0  Preparing Food and eating ? N  Using the Toilet?  N  In the past six months, have you accidently leaked urine? N  Do you have problems with loss of bowel control? N  Managing your Medications? N   Managing your Finances? N  Housekeeping or managing your Housekeeping? N    Patient Care Team: Bradd Canary, MD as PCP - General (Family Medicine) Mealor, Roberts Gaudy, MD as PCP - Electrophysiology (Cardiology) Orbie Pyo, MD as PCP - Cardiology (Cardiology) Hollar, Ronal Fear, MD as Referring Physician (Dermatology) Jeani Hawking, MD as Consulting Physician (Gastroenterology) Henrene Pastor, RPH-CPP (Pharmacist) Meriam Sprague, MD (Inactive) as Consulting Physician (Cardiology)  Indicate any recent Medical Services you may have received from other than [facility] providers in the past year (date may be approximate).     Assessment:   This is a routine wellness examination for [employer].  Hearing/Vision screen Hearing Screening - Comments:: Denies hearing difficulties   Vision Screening - Comments:: Wears reading glasses - Not up to date with routine eye exams.  You have been referred to [facility] for a complete eye exam. If you haven't heard from them in a few days, please call them to schedule your appointment.  [facility] [address] [place] [address] [place] [address] Phone: [phone]     Goals Addressed               This Visit's Progress     Increase physical activity (pt-stated)        Remain active.       Depression Screen     10/03/2023    3:57 PM 04/18/2023   10:37 AM 10/13/2022    9:57 AM 09/26/2022    2:23 PM 06/24/2022    8:58 AM 05/20/2022   11:10 AM 04/18/2022    1:55 PM  PHQ 2/9 Scores  PHQ - 2 Score 0 0 0 0 0 0 0  PHQ- 9 Score   0    0    Fall Risk     10/03/2023    3:59 PM 04/18/2023   10:37 AM 10/13/2022    9:22 AM 09/26/2022    2:22 PM 06/24/2022    8:58 AM  Fall Risk   Falls in the past year? 0 0 0 0 0  Number falls in past yr: 0 0 0 0 0  Injury with Fall? 0 0 0 0 0  Risk for fall due to : No Fall Risks No Fall Risks No Fall Risks No Fall Risks No Fall Risks  Follow up Falls prevention discussed;Falls evaluation completed  Falls evaluation completed Falls evaluation completed Falls evaluation completed Falls evaluation completed    MEDICARE RISK AT HOME:  Medicare Risk at Home Any stairs in or around the home?: No If so, are there any without handrails?: Yes Home free of loose throw rugs in walkways, pet beds, electrical cords, etc?: Yes Adequate lighting in your home to reduce risk of falls?: Yes Life alert?: No Use of a cane, walker or w/c?: No Grab bars in the bathroom?: No Shower chair or bench in shower?: No Elevated toilet seat or a handicapped toilet?: No  TIMED UP AND GO:  Was the test performed?  No  Cognitive Function: 6CIT completed    06/05/2017    1:27 PM 05/19/2016    3:06 PM  MMSE - Mini Mental State Exam  Orientation to time 5 5  Orientation to Place 5 5  Registration 3 3  Attention/ Calculation 5 5  Recall 3 3  Language- name 2 objects 2 2  Language- repeat 1 1  Language- follow 3 step command 3 3  Language- read & follow direction 1 1  Write a sentence 1 1  Copy design 1 1  Total score 30 30        10/03/2023    4:00 PM 09/26/2022    2:28 PM 09/23/2021   11:07 AM  6CIT Screen  What Year? 0 points 0 points 0 points  What month? 0 points 0 points 0 points  What time? 0 points 0 points 0 points  Count back from 20 0 points 0 points 0 points  Months in reverse 0 points 0 points 0 points  Repeat phrase 0 points 0 points 0 points  Total Score 0 points 0 points 0 points    Immunizations Immunization History  Administered Date(s) Administered   Fluad Quad(high Dose 65+) 07/09/2020, 05/05/2021, 04/18/2022   Fluad Trivalent(High Dose 65+) 04/18/2023   Influenza Whole 03/20/2012   Influenza, High Dose Seasonal PF 04/02/2013, 04/23/2015   Influenza,inj,Quad PF,6+ Mos 03/11/2014, 04/08/2019   Influenza-Unspecified 03/03/2016, 03/20/2018   PFIZER(Purple Top)SARS-COV-2 Vaccination 07/06/2019, 07/27/2019, 04/24/2020   Pneumococcal Conjugate-13 03/11/2014   Pneumococcal  Polysaccharide-23 04/18/2009   Respiratory Syncytial Virus Vaccine,Recomb Aduvanted(Arexvy) 04/18/2023   Tdap 08/05/2009   Zoster Recombinant(Shingrix) 08/24/2020, 11/17/2020   Zoster, Live 09/10/2008    Screening Tests Health Maintenance  Topic Date Due   COVID-19 Vaccine (4 - 2024-25 season) 02/19/2023   INFLUENZA VACCINE  01/19/2024   Medicare Annual Wellness (AWV)  10/02/2024   Pneumonia Vaccine 20+ Years old  Completed   Zoster Vaccines- Shingrix  Completed   HPV VACCINES  Aged Out   Meningococcal B Vaccine  Aged Out   DTaP/Tdap/Td  Discontinued    Health Maintenance  Health Maintenance Due  Topic Date Due   COVID-19 Vaccine (4 - 2024-25 season) 02/19/2023   Health Maintenance Items Addressed:    Additional Screening:  Vision Screening: Recommended annual ophthalmology exams for early detection of glaucoma and other disorders of the eye.  Dental Screening: Recommended annual dental exams for proper oral hygiene  Community Resource Referral / Chronic Care Management: CRR required this visit?  No   CCM required this visit?  No     Plan:     I have personally reviewed and noted the following in the patient's chart:   Medical and social history Use of alcohol, tobacco or illicit drugs  Current medications and supplements including opioid prescriptions. Patient is not currently taking opioid prescriptions. Functional ability and status Nutritional status Physical activity Advanced directives List of other physicians Hospitalizations, surgeries, and ER visits in previous 12 months Vitals Screenings to include cognitive, depression, and falls Referrals and appointments  In addition, I have reviewed and discussed with patient certain preventive protocols, quality metrics, and best practice recommendations. A written personalized care plan for preventive services as well as general preventive health recommendations were provided to patient.     Dewayne Ford, LPN   09/26/8117   After Visit Summary: (MyChart) Due to this being a telephonic visit, the after visit summary with patients personalized plan was offered to patient via MyChart   Notes: Nothing significant to report at this time.

## 2023-10-05 ENCOUNTER — Other Ambulatory Visit: Payer: Self-pay | Admitting: Family Medicine

## 2023-10-05 ENCOUNTER — Other Ambulatory Visit: Payer: Self-pay | Admitting: Emergency Medicine

## 2023-10-05 DIAGNOSIS — I10 Essential (primary) hypertension: Secondary | ICD-10-CM

## 2023-10-05 NOTE — Telephone Encounter (Signed)
 Requesting: zolpidem 10mg  Contract: No  will get at next visit UDS: [id number] Last Visit: 04/18/2023 Next Visit: 10/16/2023 Last Refill: 04/18/2023   Please Advise

## 2023-10-05 NOTE — Telephone Encounter (Signed)
 Called and spoke with patient. Instructions given per providers order. Lab appointment has been made.

## 2023-10-15 NOTE — Assessment & Plan Note (Signed)
Asymptomatic, no changes 

## 2023-10-15 NOTE — Assessment & Plan Note (Signed)
 hgba1c acceptable, minimize simple carbs. Increase exercise as tolerated.

## 2023-10-15 NOTE — Assessment & Plan Note (Signed)
 Well controlled, no changes to meds. Encouraged heart healthy diet such as the DASH diet and exercise as tolerated.

## 2023-10-15 NOTE — Assessment & Plan Note (Signed)
 On Levothyroxine, continue to monitor

## 2023-10-15 NOTE — Assessment & Plan Note (Signed)
Tolerating Eliquis 

## 2023-10-15 NOTE — Assessment & Plan Note (Signed)
 Supplement and monitor

## 2023-10-15 NOTE — Assessment & Plan Note (Signed)
Encouraged good sleep hygiene such as dark, quiet room. No blue/green glowing lights such as computer screens in bedroom. No alcohol or stimulants in evening. Cut down on caffeine as able. Regular exercise is helpful but not just prior to bed time. Ambien prn

## 2023-10-15 NOTE — Assessment & Plan Note (Signed)
Rate controlled, tolerating meds 

## 2023-10-15 NOTE — Assessment & Plan Note (Signed)
 Encourage heart healthy diet such as MIND or DASH diet, increase exercise, avoid trans fats, simple carbohydrates and processed foods, consider a krill or fish or flaxseed oil cap daily. Tolerating Atorvastatin

## 2023-10-16 ENCOUNTER — Ambulatory Visit (INDEPENDENT_AMBULATORY_CARE_PROVIDER_SITE_OTHER): Payer: Medicare Other | Admitting: Family Medicine

## 2023-10-16 ENCOUNTER — Encounter: Payer: Self-pay | Admitting: Family Medicine

## 2023-10-16 VITALS — BP 124/72 | HR 68 | Resp 16 | Ht 70.0 in | Wt 158.2 lb

## 2023-10-16 DIAGNOSIS — R7989 Other specified abnormal findings of blood chemistry: Secondary | ICD-10-CM

## 2023-10-16 DIAGNOSIS — R790 Abnormal level of blood mineral: Secondary | ICD-10-CM

## 2023-10-16 DIAGNOSIS — I4891 Unspecified atrial fibrillation: Secondary | ICD-10-CM

## 2023-10-16 DIAGNOSIS — I2583 Coronary atherosclerosis due to lipid rich plaque: Secondary | ICD-10-CM | POA: Diagnosis not present

## 2023-10-16 DIAGNOSIS — D6869 Other thrombophilia: Secondary | ICD-10-CM | POA: Diagnosis not present

## 2023-10-16 DIAGNOSIS — E039 Hypothyroidism, unspecified: Secondary | ICD-10-CM

## 2023-10-16 DIAGNOSIS — I480 Paroxysmal atrial fibrillation: Secondary | ICD-10-CM

## 2023-10-16 DIAGNOSIS — E785 Hyperlipidemia, unspecified: Secondary | ICD-10-CM

## 2023-10-16 DIAGNOSIS — I2510 Atherosclerotic heart disease of native coronary artery without angina pectoris: Secondary | ICD-10-CM

## 2023-10-16 DIAGNOSIS — G4700 Insomnia, unspecified: Secondary | ICD-10-CM | POA: Diagnosis not present

## 2023-10-16 DIAGNOSIS — R739 Hyperglycemia, unspecified: Secondary | ICD-10-CM | POA: Diagnosis not present

## 2023-10-16 DIAGNOSIS — I10 Essential (primary) hypertension: Secondary | ICD-10-CM | POA: Diagnosis not present

## 2023-10-16 DIAGNOSIS — N289 Disorder of kidney and ureter, unspecified: Secondary | ICD-10-CM | POA: Diagnosis not present

## 2023-10-16 MED ORDER — LEVOTHYROXINE SODIUM 100 MCG PO TABS: ORAL_TABLET | ORAL | Status: DC

## 2023-10-16 NOTE — Progress Notes (Signed)
 Subjective:    Patient ID: James [name], male    DOB: 25-Feb-1941, 83 y.o.   MRN: [id number]  Chief Complaint  Patient presents with   Medical Management of Chronic Issues    Patient presents today for a 6 month follow-up    HPI Discussed the use of AI scribe software for clinical note transcription with the patient, who gave verbal consent to proceed.  History of Present Illness James [name] is an 83 year old male who presents with episodes of lightheadedness and dizziness.  He experiences episodes of lightheadedness, particularly after breakfast and before lunch. An incident occurred where he felt lightheaded while crossing a parking lot after church, necessitating a pause to recover. These episodes occur occasionally, and he is unsure if they are related to blood sugar levels.  Approximately two weeks ago, he was hospitalized for similar symptoms and was informed that his sodium and magnesium levels were low. Additionally, his TSH was low, but his free T4 was normal. He continues to take amlodipine  but has not been monitoring his blood pressure at home.  He drinks approximately three to four 16-ounce bottles of water throughout the day but acknowledges that his morning water intake is limited to about four ounces.  He has a history of a cervical spine fracture around 1980, which may have contributed to some spinal arthritis noted in recent imaging.  No recent vision or hearing changes, trouble swallowing, cough, chest pain, or new trouble breathing. He denies chest pain or new trouble breathing.  He is currently taking a magnesium supplement, although he is unsure if it is included in his natural supplements. He has been advised to take magnesium glycinate, 200 mg daily in the evening.  He has a cardiac appointment scheduled in the next week or two.    Past Medical History:  Diagnosis Date   AAA (abdominal aortic aneurysm) without rupture (HCC) 09/08/2013   Abdominal bloating  05/19/2016   Anxiety state 05/18/2014   Atrial fibrillation (HCC)    BCC (basal cell carcinoma of skin) 06/09/2014   Benign paroxysmal positional vertigo 04/06/2013   CAD (coronary artery disease)    nonobstructive by cath 2008; nonobstructive by Cardiac CT 11/2011   Cervical spine fracture (HCC) 1980   C6-7   Diverticulosis    Erectile dysfunction 09/08/2013   GERD (gastroesophageal reflux disease)    Hiatal hernia    History of atrial fibrillation    History of squamous cell carcinoma 10-05-10   Dr Tyler Gallant, [place]    HLD (hyperlipidemia)    Hypothyroidism    Medicare annual wellness visit, subsequent 03/16/2014   Sees [place] [facility] Follows with Dr Stann Earnest of cardiology Sees [facility] Sees GI for colonoscopy, last colonoscopy in 2013    Mitral valve prolapse    Neck pain 05/03/2015   Preventative health care 03/16/2014   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   s/p excision   Syncope 02/03/2013   Testosterone  deficiency 08/05/2012    Past Surgical History:  Procedure Laterality Date   APPENDECTOMY  05/2011   CARDIAC CATHETERIZATION  01/03/12   30% ostial LAD stenosis, ostial 30-40% D1 stenosis, smooth and normal left main and RCA; LVEF 65%   CHOLECYSTECTOMY N/A 08/25/2014   Procedure: LAPAROSCOPIC CHOLECYSTECTOMY WITH INTRAOPERATIVE CHOLANGIOGRAM;  Surgeon: Adalberto Hollow, MD;  Location: [facility] OR;  Service: General;  Laterality: N/A;   INGUINAL HERNIA REPAIR  07/2010   right   KNEE ARTHROPLASTY     bilat   LEFT  HEART CATH AND CORONARY ANGIOGRAPHY N/A 10/01/2021   Procedure: LEFT HEART CATH AND CORONARY ANGIOGRAPHY;  Surgeon: Arty Binning, MD;  Location: [facility] INVASIVE CV LAB;  Service: Cardiovascular;  Laterality: N/A;   LEFT HEART CATHETERIZATION WITH CORONARY ANGIOGRAM N/A 01/03/2012   Procedure: LEFT HEART CATHETERIZATION WITH CORONARY ANGIOGRAM;  Surgeon: Lake Pilgrim, MD;  Location: [facility] CATH LAB;  Service: Cardiovascular;  Laterality: N/A;   MRI  10/31/2016    TONSILLECTOMY  1947   tumor removal  1958   "fatty tumor"    Family History  Problem Relation Age of Onset   Heart disease Mother        MI 25s   Stroke Mother    Hypertension Mother    Heart disease Maternal Grandfather        MI 44-80s   Heart disease Maternal Grandmother        MI 33-80s   Lung disease Father        penumonitis   Cancer Sister        colon   Cancer Paternal Uncle        GI: colon he thinks    Social History   Socioeconomic History   Marital status: Single    Spouse name: Not on file   Number of children: Not on file   Years of education: Not on file   Highest education level: Not on file  Occupational History   Occupation: Art gallery manager    Comment: Retired  Tobacco Use   Smoking status: Never   Smokeless tobacco: Never  Vaping Use   Vaping status: Never Used  Substance and Sexual Activity   Alcohol use: Yes    Alcohol/week: 14.0 standard drinks of alcohol    Types: 14 Glasses of wine per week    Comment: 2 glasses red wine/day   Drug use: No   Sexual activity: Yes  Other Topics Concern   Not on file  Social History Narrative   Not on file   Social Drivers of Health   Financial Resource Strain: Low Risk  (10/03/2023)   Overall Financial Resource Strain (CARDIA)    Difficulty of Paying Living Expenses: Not hard at all  Food Insecurity: No Food Insecurity (10/03/2023)   Hunger Vital Sign    Worried About Running Out of Food in the Last Year: Never true    Ran Out of Food in the Last Year: Never true  Transportation Needs: No Transportation Needs (10/03/2023)   PRAPARE - Administrator, Civil Service (Medical): No    Lack of Transportation (Non-Medical): No  Physical Activity: Sufficiently Active (10/03/2023)   Exercise Vital Sign    Days of Exercise per Week: 4 days    Minutes of Exercise per Session: 60 min  Stress: No Stress Concern Present (10/03/2023)   [employer] of Occupational Health - Occupational Stress  Questionnaire    Feeling of Stress : Not at all  Social Connections: Moderately Integrated (10/03/2023)   Social Connection and Isolation Panel [NHANES]    Frequency of Communication with Friends and Family: More than three times a week    Frequency of Social Gatherings with Friends and Family: More than three times a week    Attends Religious Services: More than 4 times per year    Active Member of [employer] or Organizations: Yes    Attends Engineer, structural: More than 4 times per year    Marital Status: Divorced  Catering manager Violence: Not At Risk (  10/03/2023)   Humiliation, Afraid, Rape, and Kick questionnaire    Fear of Current or Ex-Partner: No    Emotionally Abused: No    Physically Abused: No    Sexually Abused: No    Outpatient Medications Prior to Visit  Medication Sig Dispense Refill   ASHWAGANDHA PO Take 1 tablet by mouth daily.     atorvastatin  (LIPITOR) 20 MG tablet Take 1 tablet (20 mg total) by mouth daily. 90 tablet 1   Calcium  Carb-Cholecalciferol (CALCIUM  1000 + D PO) Take 1 tablet by mouth 3 (three) times a week.      COLLAGEN PO Take 1 capsule by mouth every other day. (Chewable - has hyaluronic acid, collagen and biotin)     Cyanocobalamin  (VITAMIN B 12 PO) Take 3,000 mcg by mouth daily.     diclofenac  Sodium (VOLTAREN ) 1 % GEL Apply 4 g topically 4 (four) times daily. To affected joint. (Patient taking differently: Apply 4 g topically as needed. To affected joint.) 100 g 11   Dutasteride -Tamsulosin  HCl 0.5-0.4 MG CAPS Take 1 capsule by mouth at bedtime. 90 capsule 1   ELDERBERRY PO Take 50 mg by mouth. 4 or 5 times per week     ELIQUIS  5 MG TABS tablet TAKE 1 TABLET BY MOUTH 2 TIMES A DAY 60 tablet 5   fluticasone  (FLONASE ) 50 MCG/ACT nasal spray Place 2 sprays into both nostrils daily. 16 g 6   guaiFENesin  (MUCINEX ) 600 MG 12 hr tablet Take 2 tablets (1,200 mg total) by mouth 2 (two) times daily as needed. 30 tablet 0   LORazepam  (ATIVAN ) 0.5 MG tablet  Take 1 tablet (0.5 mg total) by mouth 2 (two) times daily as needed. for anxiety 30 tablet 2   Melatonin 10 MG TABS Take 10 mg by mouth daily.     metoprolol  succinate (TOPROL  XL) 25 MG 24 hr tablet Take 1 tablet (25 mg total) by mouth daily. 90 tablet 3   Milk Thistle 1000 MG CAPS Take 1,000 mg by mouth in the morning.     mirtazapine  (REMERON ) 15 MG tablet Take 1 tablet (15 mg total) by mouth at bedtime. 90 tablet 1   Multiple Vitamins-Minerals (MULTIVITAMINS THER. W/MINERALS) TABS Take 1 tablet by mouth daily.     pantoprazole  (PROTONIX ) 40 MG tablet Take 1 tablet (40 mg total) by mouth 2 (two) times daily before a meal. 180 tablet 1   Probiotic Product (PROBIOTIC DAILY PO) Take 1 tablet by mouth daily.     sildenafil  (REVATIO ) 20 MG tablet TAKE 1 TO 5 TABLETS BY MOUTH EVERY DAY AS NEEDED 35 tablet 1   Testosterone  30 MG/ACT SOLN APPLY 2 PUMPS TOPICALLY EVERY DAY AS DIRECTED 180 mL 1   Turmeric (QC TUMERIC COMPLEX) 500 MG CAPS Take 1 tablet by mouth daily.     zolpidem  (AMBIEN ) 10 MG tablet TAKE 1 TABLET BY MOUTH NIGHTLY AT BEDTIME AS NEEDED FOR SLEEP 30 tablet 1   levothyroxine  (SYNTHROID ) 100 MCG tablet TAKE 1 TABLET BY MOUTH EVERY MORNING BEFORE BREAKFAST 90 tablet 1   amLODipine  (NORVASC ) 2.5 MG tablet Take 1 tablet (2.5 mg total) by mouth daily. (Patient not taking: Reported on 10/02/2023) 90 tablet 3   No facility-administered medications prior to visit.    Allergies  Allergen Reactions   Sulfa Antibiotics Other (See Comments)    Childhood allergy    Review of Systems  Constitutional:  Positive for malaise/fatigue. Negative for fever.  HENT:  Negative for congestion.   Eyes:  Negative for blurred vision.  Respiratory:  Negative for shortness of breath.   Cardiovascular:  Negative for chest pain, palpitations and leg swelling.  Gastrointestinal:  Negative for abdominal pain, blood in stool and nausea.  Genitourinary:  Negative for dysuria and frequency.  Musculoskeletal:   Negative for falls.  Skin:  Negative for rash.  Neurological:  Positive for dizziness. Negative for loss of consciousness and headaches.  Endo/Heme/Allergies:  Negative for environmental allergies.  Psychiatric/Behavioral:  Negative for depression. The patient is not nervous/anxious.        Objective:    Physical Exam Vitals reviewed.  Constitutional:      Appearance: Normal appearance. He is not ill-appearing.  HENT:     Head: Normocephalic and atraumatic.     Nose: Nose normal.  Eyes:     Conjunctiva/sclera: Conjunctivae normal.  Cardiovascular:     Rate and Rhythm: Bradycardia present.     Pulses: Normal pulses.     Heart sounds: Normal heart sounds. No murmur heard. Pulmonary:     Effort: Pulmonary effort is normal.     Breath sounds: Normal breath sounds. No wheezing.  Abdominal:     Palpations: Abdomen is soft. There is no mass.     Tenderness: There is no abdominal tenderness.  Musculoskeletal:     Cervical back: Normal range of motion.     Right lower leg: No edema.     Left lower leg: No edema.  Skin:    General: Skin is warm and dry.  Neurological:     General: No focal deficit present.     Mental Status: He is alert and oriented to person, place, and time.  Psychiatric:        Mood and Affect: Mood normal.     BP 124/72   Pulse 68   Resp 16   Ht 5\' 10"  (1.778 m)   Wt 158 lb 3.2 oz (71.8 kg)   SpO2 96%   BMI 22.70 kg/m  Wt Readings from Last 3 Encounters:  10/16/23 158 lb 3.2 oz (71.8 kg)  10/03/23 160 lb (72.6 kg)  04/18/23 163 lb (73.9 kg)    Diabetic Foot Exam - Simple   No data filed    Lab Results  Component Value Date   WBC 6.8 04/18/2023   HGB 14.7 04/18/2023   HCT 43.9 04/18/2023   PLT 226.0 04/18/2023   GLUCOSE 86 04/18/2023   CHOL 130 04/18/2023   TRIG 80.0 04/18/2023   HDL 65.30 04/18/2023   LDLCALC 49 04/18/2023   ALT 18 04/18/2023   AST 24 04/18/2023   NA 134 (L) 04/18/2023   K 4.2 04/18/2023   CL 99 04/18/2023    CREATININE 0.89 04/18/2023   BUN 13 04/18/2023   CO2 29 04/18/2023   TSH 4.08 04/18/2023   PSA 0.40 09/06/2021   INR 1.09 08/21/2014   HGBA1C 5.5 04/18/2023    Lab Results  Component Value Date   TSH 4.08 04/18/2023   Lab Results  Component Value Date   WBC 6.8 04/18/2023   HGB 14.7 04/18/2023   HCT 43.9 04/18/2023   MCV 96.4 04/18/2023   PLT 226.0 04/18/2023   Lab Results  Component Value Date   NA 134 (L) 04/18/2023   K 4.2 04/18/2023   CO2 29 04/18/2023   GLUCOSE 86 04/18/2023   BUN 13 04/18/2023   CREATININE 0.89 04/18/2023   BILITOT 1.0 04/18/2023   ALKPHOS 55 04/18/2023   AST 24 04/18/2023   ALT 18  04/18/2023   PROT 7.0 04/18/2023   ALBUMIN 4.2 04/18/2023   CALCIUM  9.5 04/18/2023   ANIONGAP 11 03/07/2020   EGFR 90.0 05/06/2023   GFR 79.33 04/18/2023   Lab Results  Component Value Date   CHOL 130 04/18/2023   Lab Results  Component Value Date   HDL 65.30 04/18/2023   Lab Results  Component Value Date   LDLCALC 49 04/18/2023   Lab Results  Component Value Date   TRIG 80.0 04/18/2023   Lab Results  Component Value Date   CHOLHDL 2 04/18/2023   Lab Results  Component Value Date   HGBA1C 5.5 04/18/2023       Assessment & Plan:  Atrial fibrillation, unspecified type (HCC) Assessment & Plan: Rate controlled, tolerating meds   Coronary artery disease due to lipid rich plaque Assessment & Plan: Asymptomatic, no changes   Essential hypertension Assessment & Plan: Well controlled, no changes to meds. Encouraged heart healthy diet such as the DASH diet and exercise as tolerated.   Orders: -     CBC with Differential/Platelet -     Comprehensive metabolic panel with GFR  Hyperglycemia Assessment & Plan: hgba1c acceptable, minimize simple carbs. Increase exercise as tolerated.    Hypercoagulable state due to paroxysmal atrial fibrillation [facility]) Assessment & Plan: Tolerating Eliquis    Hyperlipidemia, unspecified hyperlipidemia  type Assessment & Plan: Encourage heart healthy diet such as MIND or DASH diet, increase exercise, avoid trans fats, simple carbohydrates and processed foods, consider a krill or fish or flaxseed oil cap daily. Tolerating Atorvastatin    Hypothyroidism, unspecified type Assessment & Plan: On Levothyroxine , continue to monitor  Orders: -     US  THYROID ; Future -     TSH; Future -     T4, free; Future  Insomnia, unspecified type Assessment & Plan: Encouraged good sleep hygiene such as dark, quiet room. No blue/green glowing lights such as computer screens in bedroom. No alcohol or stimulants in evening. Cut down on caffeine as able. Regular exercise is helpful but not just prior to bed time. Ambien  prn   Low testosterone  Assessment & Plan: Supplement and monitor    Renal insufficiency Assessment & Plan: Supplement and monitor   Low magnesium level -     Magnesium  Other orders -     Levothyroxine  Sodium; 1 tab daily x 6 days a week, skip saturdays    Assessment and Plan Assessment & Plan Thyroid  nodule Multiple nodules, one >1 cm. Further evaluation needed to rule out malignancy. - Order thyroid  ultrasound.  Hypothyroidism TSH low, free T4 high normal. Levothyroxine  adjusted to manage levels. - Recheck TSH and free T4 in 8-10 weeks. - Adjust levothyroxine  to 100 mcg daily, skip Saturdays.  Anemia, unspecified Mild anemia, no GI bleeding. Further evaluation needed. - Order CBC.  Hyponatremia Slight hyponatremia, likely related to fluid intake. Advised on fluid management. - Recheck sodium levels today. - Advise 5 oz water hourly, max 80 oz/24 hrs.  Magnesium deficiency Slight deficiency. Supplementation recommended. - Recheck magnesium levels today. - Start magnesium glycinate 200 mg daily in the evening.     Randie Bustle, MD

## 2023-10-16 NOTE — Patient Instructions (Addendum)
 Magnesium glycinate 200 mcg daily in evening 5 ounces of water roughly every hour to max of 80 ounces in 24 hours Protein source every 3-4 hours

## 2023-10-17 ENCOUNTER — Other Ambulatory Visit: Payer: Self-pay | Admitting: Family Medicine

## 2023-10-17 ENCOUNTER — Ambulatory Visit: Payer: Medicare Other | Admitting: Family Medicine

## 2023-10-17 LAB — CBC WITH DIFFERENTIAL/PLATELET
Basophils Absolute: 0 10*3/uL (ref 0.0–0.1)
Basophils Relative: 0.8 % (ref 0.0–3.0)
Eosinophils Absolute: 0.1 10*3/uL (ref 0.0–0.7)
Eosinophils Relative: 0.9 % (ref 0.0–5.0)
HCT: 41 % (ref 39.0–52.0)
Hemoglobin: 14.2 g/dL (ref 13.0–17.0)
Lymphocytes Relative: 27.6 % (ref 12.0–46.0)
Lymphs Abs: 1.6 10*3/uL (ref 0.7–4.0)
MCHC: 34.6 g/dL (ref 30.0–36.0)
MCV: 95.1 fl (ref 78.0–100.0)
Monocytes Absolute: 0.7 10*3/uL (ref 0.1–1.0)
Monocytes Relative: 11.8 % (ref 3.0–12.0)
Neutro Abs: 3.5 10*3/uL (ref 1.4–7.7)
Neutrophils Relative %: 58.9 % (ref 43.0–77.0)
Platelets: 224 10*3/uL (ref 150.0–400.0)
RBC: 4.31 Mil/uL (ref 4.22–5.81)
RDW: 12.8 % (ref 11.5–15.5)
WBC: 6 10*3/uL (ref 4.0–10.5)

## 2023-10-17 LAB — TSH: TSH: 1.94 u[IU]/mL (ref 0.35–5.50)

## 2023-10-17 LAB — COMPREHENSIVE METABOLIC PANEL WITH GFR
ALT: 14 U/L (ref 0–53)
AST: 20 U/L (ref 0–37)
Albumin: 4.1 g/dL (ref 3.5–5.2)
Alkaline Phosphatase: 51 U/L (ref 39–117)
BUN: 12 mg/dL (ref 6–23)
CO2: 29 meq/L (ref 19–32)
Calcium: 9.3 mg/dL (ref 8.4–10.5)
Chloride: 97 meq/L (ref 96–112)
Creatinine, Ser: 0.87 mg/dL (ref 0.40–1.50)
GFR: 79.6 mL/min (ref >60.00)
Glucose, Bld: 93 mg/dL (ref 70–99)
Potassium: 4.3 meq/L (ref 3.5–5.1)
Sodium: 133 meq/L — ABNORMAL LOW (ref 135–145)
Total Bilirubin: 0.9 mg/dL (ref 0.2–1.2)
Total Protein: 6.9 g/dL (ref 6.0–8.3)

## 2023-10-17 LAB — MAGNESIUM: Magnesium: 1.8 mg/dL (ref 1.5–2.5)

## 2023-10-17 LAB — T4, FREE: Free T4: 0.97 ng/dL (ref 0.60–1.60)

## 2023-10-18 ENCOUNTER — Encounter: Payer: Self-pay | Admitting: Family Medicine

## 2023-10-19 ENCOUNTER — Ambulatory Visit (HOSPITAL_BASED_OUTPATIENT_CLINIC_OR_DEPARTMENT_OTHER)
Admission: RE | Admit: 2023-10-19 | Discharge: 2023-10-19 | Disposition: A | Discharge status: Home / Self Care | Source: Ambulatory Visit | Attending: Family Medicine | Admitting: Family Medicine

## 2023-10-19 DIAGNOSIS — E039 Hypothyroidism, unspecified: Secondary | ICD-10-CM | POA: Insufficient documentation

## 2023-10-19 DIAGNOSIS — E042 Nontoxic multinodular goiter: Secondary | ICD-10-CM | POA: Diagnosis not present

## 2023-10-20 ENCOUNTER — Other Ambulatory Visit: Payer: Self-pay

## 2023-10-20 DIAGNOSIS — E871 Hypo-osmolality and hyponatremia: Secondary | ICD-10-CM

## 2023-10-20 NOTE — Progress Notes (Unsigned)
 Cardiology Office Note:    Date:  10/23/2023   ID:  James [name], DOB 1940-11-21, MRN [id number]  PCP:  [name], MD   [facility] Providers Cardiologist:  Kyra Phy, MD Electrophysiologist:  Efraim Grange, MD     Referring MD: [name], MD   Chief Complaint  Patient presents with   Follow-up    History of Present Illness:    James [name] is a 83 y.o. male with a hx of nonobstructive CAD with CP suspected due to vasospasm, last heart cath 10/01/21, cardiac MRI 10/27/21 with normal LVEF and no LGE, RVEF 45%, atrial tachycardia seen by EP, PAF with < 1% burden on heart monitor, MV prolapse, dilated ascending aorta, HTN, HLD, carotid artery disease, dilated aorta 41 mm on echo 10/2022 - needs repeat study 10/2023.  Vasospasm/chest pain improved on imdur , but this was switched to amlodipine  due to sildenafil  use. PAF on 25 mg toprol  and 5 mg eliquis  BID.   He was hospitalized 09/28/2023 with altered mental status, dizziness, and vertigo.  Workup largely negative including MRI brain and CTA head and neck.  Hyponatremia of 129 improved to 132.  Other electrolytes supplemented.  PCP has followed labs since discharge.  He presents today for cardiology follow-up. He has not had another episode. He takes ambien  every night. I advised to not mix this with ativan  as ambien  can act as an antagonist at alpha5 receptors. Amlodipine  was stopped at discharge and I agree with this as his BP is marginal today. Fortunately he has been off amlodipine  and has walked 1500 meters this past weekend without chest pain. We will monitor him off amlodipine .   Unclear if his episode of confusion was due to low Mg and Na. Now on Mg supplement.    Past Medical History:  Diagnosis Date   AAA (abdominal aortic aneurysm) without rupture (HCC) 09/08/2013   Abdominal bloating 05/19/2016   Anxiety state 05/18/2014   Atrial fibrillation (HCC)    BCC (basal cell carcinoma of skin)  06/09/2014   Benign paroxysmal positional vertigo 04/06/2013   CAD (coronary artery disease)    nonobstructive by cath 2008; nonobstructive by Cardiac CT 11/2011   Cervical spine fracture (HCC) 1980   C6-7   Diverticulosis    Erectile dysfunction 09/08/2013   GERD (gastroesophageal reflux disease)    Hiatal hernia    History of atrial fibrillation    History of squamous cell carcinoma 10-05-10   Dr Tyler Gallant, [place]    HLD (hyperlipidemia)    Hypothyroidism    Medicare annual wellness visit, subsequent 03/16/2014   Sees [facility] Follows with Dr Stann Earnest of cardiology Sees [facility] Sees GI for colonoscopy, last colonoscopy in 2013    Mitral valve prolapse    Neck pain 05/03/2015   Preventative health care 03/16/2014   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   s/p excision   Syncope 02/03/2013   Testosterone  deficiency 08/05/2012    Past Surgical History:  Procedure Laterality Date   APPENDECTOMY  05/2011   CARDIAC CATHETERIZATION  01/03/12   30% ostial LAD stenosis, ostial 30-40% D1 stenosis, smooth and normal left main and RCA; LVEF 65%   CHOLECYSTECTOMY N/A 08/25/2014   Procedure: LAPAROSCOPIC CHOLECYSTECTOMY WITH INTRAOPERATIVE CHOLANGIOGRAM;  Surgeon: Adalberto Hollow, MD;  Location: [facility] OR;  Service: General;  Laterality: N/A;   INGUINAL HERNIA REPAIR  07/2010   right   KNEE ARTHROPLASTY     bilat   LEFT HEART  CATH AND CORONARY ANGIOGRAPHY N/A 10/01/2021   Procedure: LEFT HEART CATH AND CORONARY ANGIOGRAPHY;  Surgeon: Arty Binning, MD;  Location: [facility] INVASIVE CV LAB;  Service: Cardiovascular;  Laterality: N/A;   LEFT HEART CATHETERIZATION WITH CORONARY ANGIOGRAM N/A 01/03/2012   Procedure: LEFT HEART CATHETERIZATION WITH CORONARY ANGIOGRAM;  Surgeon: Lake Pilgrim, MD;  Location: [facility] CATH LAB;  Service: Cardiovascular;  Laterality: N/A;   MRI  10/31/2016   TONSILLECTOMY  1947   tumor removal  1958   "fatty tumor"    Current Medications: Current Meds  Medication Sig    ASHWAGANDHA PO Take 1 tablet by mouth daily.   atorvastatin  (LIPITOR) 20 MG tablet Take 1 tablet (20 mg total) by mouth daily.   Calcium  Carb-Cholecalciferol (CALCIUM  1000 + D PO) Take 1 tablet by mouth 3 (three) times a week.    COLLAGEN PO Take 1 capsule by mouth every other day. (Chewable - has hyaluronic acid, collagen and biotin)   Cyanocobalamin  (VITAMIN B 12 PO) Take 3,000 mcg by mouth daily.   diclofenac  Sodium (VOLTAREN ) 1 % GEL Apply 4 g topically 4 (four) times daily. To affected joint. (Patient taking differently: Apply 4 g topically as needed. To affected joint.)   Dutasteride -Tamsulosin  HCl 0.5-0.4 MG CAPS Take 1 capsule by mouth at bedtime.   ELDERBERRY PO Take 50 mg by mouth. 4 or 5 times per week   fluticasone  (FLONASE ) 50 MCG/ACT nasal spray Place 2 sprays into both nostrils daily.   guaiFENesin  (MUCINEX ) 600 MG 12 hr tablet Take 2 tablets (1,200 mg total) by mouth 2 (two) times daily as needed.   levothyroxine  (SYNTHROID ) 100 MCG tablet 1 tab daily x 6 days a week, skip saturdays   LORazepam  (ATIVAN ) 0.5 MG tablet Take 1 tablet (0.5 mg total) by mouth 2 (two) times daily as needed. for anxiety   Melatonin 10 MG TABS Take 10 mg by mouth daily.   Milk Thistle 1000 MG CAPS Take 1,000 mg by mouth in the morning.   mirtazapine  (REMERON ) 15 MG tablet Take 1 tablet (15 mg total) by mouth at bedtime.   Multiple Vitamins-Minerals (MULTIVITAMINS THER. W/MINERALS) TABS Take 1 tablet by mouth daily.   pantoprazole  (PROTONIX ) 40 MG tablet Take 1 tablet (40 mg total) by mouth 2 (two) times daily before a meal.   Probiotic Product (PROBIOTIC DAILY PO) Take 1 tablet by mouth daily.   sildenafil  (REVATIO ) 20 MG tablet TAKE 1 TO 5 TABLETS BY MOUTH EVERY DAY AS NEEDED   Testosterone  30 MG/ACT SOLN APPLY 2 PUMPS TOPICALLY EVERY DAY AS DIRECTED   Turmeric (QC TUMERIC COMPLEX) 500 MG CAPS Take 1 tablet by mouth daily.   zolpidem  (AMBIEN ) 10 MG tablet TAKE 1 TABLET BY MOUTH NIGHTLY AT BEDTIME AS  NEEDED FOR SLEEP   [DISCONTINUED] ELIQUIS  5 MG TABS tablet TAKE 1 TABLET BY MOUTH 2 TIMES A DAY   [DISCONTINUED] metoprolol  succinate (TOPROL  XL) 25 MG 24 hr tablet Take 1 tablet (25 mg total) by mouth daily.     Allergies:   Sulfa antibiotics   Social History   Socioeconomic History   Marital status: Single    Spouse name: Not on file   Number of children: Not on file   Years of education: Not on file   Highest education level: Not on file  Occupational History   Occupation: Art gallery manager    Comment: Retired  Tobacco Use   Smoking status: Never   Smokeless tobacco: Never  Vaping Use   Vaping  status: Never Used  Substance and Sexual Activity   Alcohol use: Yes    Alcohol/week: 14.0 standard drinks of alcohol    Types: 14 Glasses of wine per week    Comment: 2 glasses red wine/day   Drug use: No   Sexual activity: Yes  Other Topics Concern   Not on file  Social History Narrative   Not on file   Social Drivers of Health   Financial Resource Strain: Low Risk  (10/03/2023)   Overall Financial Resource Strain (CARDIA)    Difficulty of Paying Living Expenses: Not hard at all  Food Insecurity: No Food Insecurity (10/03/2023)   Hunger Vital Sign    Worried About Running Out of Food in the Last Year: Never true    Ran Out of Food in the Last Year: Never true  Transportation Needs: No Transportation Needs (10/03/2023)   PRAPARE - Administrator, Civil Service (Medical): No    Lack of Transportation (Non-Medical): No  Physical Activity: Sufficiently Active (10/03/2023)   Exercise Vital Sign    Days of Exercise per Week: 4 days    Minutes of Exercise per Session: 60 min  Stress: No Stress Concern Present (10/03/2023)   [employer] of Occupational Health - Occupational Stress Questionnaire    Feeling of Stress : Not at all  Social Connections: Moderately Integrated (10/03/2023)   Social Connection and Isolation Panel [NHANES]    Frequency of Communication with  Friends and Family: More than three times a week    Frequency of Social Gatherings with Friends and Family: More than three times a week    Attends Religious Services: More than 4 times per year    Active Member of [employer] or Organizations: Yes    Attends Engineer, structural: More than 4 times per year    Marital Status: Divorced     Family History: The patient's family history includes Cancer in his paternal uncle and sister; Heart disease in his maternal grandfather, maternal grandmother, and mother; Hypertension in his mother; Lung disease in his father; Stroke in his mother.  ROS:   Please see the history of present illness.     All other systems reviewed and are negative.  EKGs/Labs/Other Studies Reviewed:    The following studies were reviewed today:       Recent Labs: 10/16/2023: ALT 14; BUN 12; Creatinine, Ser 0.87; Hemoglobin 14.2; Magnesium 1.8; Platelets 224.0; Potassium 4.3; Sodium 133; TSH 1.94  Recent Lipid Panel    Component Value Date/Time   CHOL 130 04/18/2023 1147   TRIG 80.0 04/18/2023 1147   HDL 65.30 04/18/2023 1147   CHOLHDL 2 04/18/2023 1147   VLDL 16.0 04/18/2023 1147   LDLCALC 49 04/18/2023 1147   LDLCALC 46 07/09/2020 1526     Risk Assessment/Calculations:    CHA2DS2-VASc Score = 3   This indicates a 3.2% annual risk of stroke. The patient's score is based upon: CHF History: 0 HTN History: 0 Diabetes History: 0 Stroke History: 0 Vascular Disease History: 1 Age Score: 2 Gender Score: 0             Physical Exam:    VS:  BP 102/60   Pulse (!) 55   Ht 5\' 10"  (1.778 m)   Wt 160 lb 9.6 oz (72.8 kg)   SpO2 98%   BMI 23.04 kg/m     Wt Readings from Last 3 Encounters:  10/23/23 160 lb 9.6 oz (72.8 kg)  10/16/23 158 lb 3.2  oz (71.8 kg)  10/03/23 160 lb (72.6 kg)     GEN:  Well nourished, well developed in no acute distress HEENT: Normal NECK: No JVD; No carotid bruits LYMPHATICS: No lymphadenopathy CARDIAC: RRR, no  murmurs, rubs, gallops RESPIRATORY:  Clear to auscultation without rales, wheezing or rhonchi  ABDOMEN: Soft, non-tender, non-distended MUSCULOSKELETAL:  No edema; No deformity  SKIN: Warm and dry NEUROLOGIC:  Alert and oriented x 3 PSYCHIATRIC:  Normal affect   ASSESSMENT:    1. Paroxysmal atrial fibrillation (HCC)   2. Chronic anticoagulation   3. Essential hypertension   4. Hyperlipidemia with target LDL less than 70   5. Abdominal aortic aneurysm (AAA) without rupture, unspecified part (HCC)   6. Aneurysm of ascending aorta without rupture (HCC)    PLAN:    In order of problems listed above:  Paroxysmal atrial fibrillation Chronic anticoagulation - Doing well on Eliquis  5 mg twice daily -- no palpitations   Ascending aorta dilation - Measured 41 mm on echocardiogram 10/2022 - Due for repeat echocardiogram 10/2023   Nonobstructive CAD Chest pain felt due to vasospasm - Has done well on amlodipine , now discontinued for hypotension - Continue 25 mg Toprol , no aspirin  given Eliquis    Hyperlipidemia with LDL goal less than 70 - On 20 mg Lipitor 04/18/2023: Cholesterol 130; HDL 65.30; LDL Cholesterol 49; Triglycerides 80.0; VLDL 16.0 -Well-controlled, no medication changes      Follow up in 6 months - Angie Brixon Zhen, Marlyse Single, or Dr. Lorie Rook     Medication Adjustments/Labs and Tests Ordered: Current medicines are reviewed at length with the patient today.  Concerns regarding medicines are outlined above.  No orders of the defined types were placed in this encounter.  Meds ordered this encounter  Medications   apixaban  (ELIQUIS ) 5 MG TABS tablet    Sig: Take 1 tablet (5 mg total) by mouth 2 (two) times daily.    Dispense:  60 tablet    Refill:  5   metoprolol  succinate (TOPROL  XL) 25 MG 24 hr tablet    Sig: Take 1 tablet (25 mg total) by mouth daily.    Dispense:  90 tablet    Refill:  3    Please keep on file for next refill.  Pt has current supply.  Dose  increase.  Thanks.    There are no Patient Instructions on file for this visit.   Signed, Lamond Pilot, PA  10/23/2023 2:56 PM    [facility]

## 2023-10-22 ENCOUNTER — Encounter: Payer: Self-pay | Admitting: Family Medicine

## 2023-10-23 ENCOUNTER — Ambulatory Visit: Attending: Physician Assistant | Admitting: Physician Assistant

## 2023-10-23 ENCOUNTER — Encounter: Payer: Self-pay | Admitting: Physician Assistant

## 2023-10-23 VITALS — BP 102/60 | HR 55 | Ht 70.0 in | Wt 160.6 lb

## 2023-10-23 DIAGNOSIS — Z7901 Long term (current) use of anticoagulants: Secondary | ICD-10-CM | POA: Diagnosis not present

## 2023-10-23 DIAGNOSIS — I7121 Aneurysm of the ascending aorta, without rupture: Secondary | ICD-10-CM

## 2023-10-23 DIAGNOSIS — E785 Hyperlipidemia, unspecified: Secondary | ICD-10-CM

## 2023-10-23 DIAGNOSIS — I480 Paroxysmal atrial fibrillation: Secondary | ICD-10-CM

## 2023-10-23 DIAGNOSIS — I10 Essential (primary) hypertension: Secondary | ICD-10-CM | POA: Diagnosis not present

## 2023-10-23 DIAGNOSIS — I714 Abdominal aortic aneurysm, without rupture, unspecified: Secondary | ICD-10-CM | POA: Diagnosis not present

## 2023-10-23 DIAGNOSIS — I77819 Aortic ectasia, unspecified site: Secondary | ICD-10-CM

## 2023-10-23 DIAGNOSIS — I351 Nonrheumatic aortic (valve) insufficiency: Secondary | ICD-10-CM | POA: Diagnosis not present

## 2023-10-23 MED ORDER — APIXABAN 5 MG PO TABS: 5.0000 mg | ORAL_TABLET | Freq: Two times a day (BID) | ORAL | 5 refills | Status: DC

## 2023-10-23 MED ORDER — METOPROLOL SUCCINATE ER 25 MG PO TB24: 25.0000 mg | ORAL_TABLET | Freq: Every day | ORAL | 3 refills | Status: DC

## 2023-10-23 NOTE — Patient Instructions (Signed)
 Medication Instructions:  NO CHANGES *If you need a refill on your cardiac medications before your next appointment, please call your pharmacy*  Lab Work: NO LABS If you have labs (blood work) drawn today and your tests are completely normal, you will receive your results only by: MyChart Message (if you have MyChart) OR A paper copy in the mail If you have any lab test that is abnormal or we need to change your treatment, we will call you to review the results.  Testing/Procedures: NO TESTING  Follow-Up: At [facility], you and your health needs are our priority.  As part of our continuing mission to provide you with exceptional heart care, our providers are all part of one team.  This team includes your primary Cardiologist (physician) and Advanced Practice Providers or APPs (Physician Assistants and Nurse Practitioners) who all work together to provide you with the care you need, when you need it.  Your next appointment:   6 month(s)  Provider:   Arun K Thukkani, MD or Marcie Sever, [place]

## 2023-10-26 ENCOUNTER — Ambulatory Visit (HOSPITAL_COMMUNITY): Payer: Medicare Other | Attending: Cardiology

## 2023-10-26 DIAGNOSIS — I77819 Aortic ectasia, unspecified site: Secondary | ICD-10-CM | POA: Diagnosis not present

## 2023-10-26 DIAGNOSIS — I351 Nonrheumatic aortic (valve) insufficiency: Secondary | ICD-10-CM | POA: Diagnosis not present

## 2023-10-26 LAB — ECHOCARDIOGRAM COMPLETE
Area-P 1/2: 2.87 cm2
S' Lateral: 3.3 cm

## 2023-10-27 ENCOUNTER — Encounter: Payer: Self-pay | Admitting: Internal Medicine

## 2023-10-31 ENCOUNTER — Ambulatory Visit: Payer: Self-pay

## 2023-11-03 ENCOUNTER — Other Ambulatory Visit (HOSPITAL_COMMUNITY): Payer: Self-pay

## 2023-11-03 ENCOUNTER — Other Ambulatory Visit (INDEPENDENT_AMBULATORY_CARE_PROVIDER_SITE_OTHER)

## 2023-11-03 ENCOUNTER — Telehealth: Payer: Self-pay

## 2023-11-03 DIAGNOSIS — E871 Hypo-osmolality and hyponatremia: Secondary | ICD-10-CM

## 2023-11-03 LAB — COMPREHENSIVE METABOLIC PANEL WITH GFR
ALT: 15 U/L (ref 0–53)
AST: 19 U/L (ref 0–37)
Albumin: 4 g/dL (ref 3.5–5.2)
Alkaline Phosphatase: 50 U/L (ref 39–117)
BUN: 14 mg/dL (ref 6–23)
CO2: 31 meq/L (ref 19–32)
Calcium: 9.1 mg/dL (ref 8.4–10.5)
Chloride: 98 meq/L (ref 96–112)
Creatinine, Ser: 0.83 mg/dL (ref 0.40–1.50)
GFR: 80.71 mL/min (ref >60.00)
Glucose, Bld: 99 mg/dL (ref 70–99)
Potassium: 4.4 meq/L (ref 3.5–5.1)
Sodium: 134 meq/L — ABNORMAL LOW (ref 135–145)
Total Bilirubin: 0.9 mg/dL (ref 0.2–1.2)
Total Protein: 6.6 g/dL (ref 6.0–8.3)

## 2023-11-03 NOTE — Telephone Encounter (Signed)
 Pharmacy Patient Advocate Encounter   Received notification from CoverMyMeds that prior authorization for Zolpidem  Tartrate 10MG  tablets is required/requested.   Insurance verification completed.   The patient is insured through [facility] MEDICARE.   Per test claim: PA required; PA submitted to above mentioned insurance via CoverMyMeds Key/confirmation #/EOC  (Key: [id number])   Status is pending

## 2023-11-05 ENCOUNTER — Ambulatory Visit: Payer: Self-pay | Admitting: Family Medicine

## 2023-11-06 ENCOUNTER — Other Ambulatory Visit (HOSPITAL_COMMUNITY): Payer: Self-pay

## 2023-11-06 NOTE — Telephone Encounter (Signed)
 Pharmacy Patient Advocate Encounter  Received notification from [facility] that Prior Authorization for Zolpidem  Tartrate 10MG  tablets  has been APPROVED from 2.15.25 to 12.31.25. Ran test claim, Copay is $0.73. This test claim was processed through [facility]- copay amounts may vary at other pharmacies due to pharmacy/plan contracts, or as the patient moves through the different stages of their insurance plan.   PA #/Case ID/Reference #: (Key: [id number])

## 2023-11-14 ENCOUNTER — Other Ambulatory Visit: Payer: Self-pay | Admitting: Family Medicine

## 2023-11-14 DIAGNOSIS — I10 Essential (primary) hypertension: Secondary | ICD-10-CM

## 2023-11-14 NOTE — Telephone Encounter (Signed)
 Requesting: Testosterone  30 MG Contract:  UDS: 04/18/2023 Last Visit: 10/16/2023 Next Visit: 04/18/2024 Last Refill: 06/08/2023, 180 mL rf: 1  Please Advise

## 2023-11-16 ENCOUNTER — Telehealth: Payer: Self-pay

## 2023-11-16 ENCOUNTER — Other Ambulatory Visit (HOSPITAL_COMMUNITY): Payer: Self-pay

## 2023-11-16 NOTE — Telephone Encounter (Signed)
 Pharmacy Patient Advocate Encounter   Received notification from Onbase that prior authorization for Testosterone  30MG /ACT solution  is required/requested.   Insurance verification completed.   The patient is insured through [employer] .   Per test claim: PA required; PA submitted to above mentioned insurance via CoverMyMeds Key/confirmation #/EOC [id number] Status is pending

## 2023-11-20 NOTE — Telephone Encounter (Signed)
 Insurance required additional info. Filled out and faxed back.

## 2023-11-21 NOTE — Telephone Encounter (Signed)
 Pharmacy Patient Advocate Encounter  Received notification from [employer] that Prior Authorization for Testosterone  30MG /ACT solution  has been DENIED.  Full denial letter will be uploaded to the media tab. See denial reason below.   PA #/Case ID/Reference #: [id number]

## 2023-11-21 NOTE — Telephone Encounter (Signed)
 Does hypogonadism mean low testosterone  and patient been on this medication since 2014. Should an appeal be done?

## 2023-11-22 ENCOUNTER — Encounter: Payer: Self-pay | Admitting: *Deleted

## 2023-11-22 DIAGNOSIS — E291 Testicular hypofunction: Secondary | ICD-10-CM | POA: Insufficient documentation

## 2023-11-24 ENCOUNTER — Telehealth: Payer: Self-pay | Admitting: Pharmacist

## 2023-11-24 NOTE — Telephone Encounter (Signed)
 Appeal has been submitted for Testosterone  Soln. Will advise when response is received, please be advised that most companies may take 30 days to make a decision. Appeal letter and supporting documentation have been faxed to [phone] on 11/24/2023 @8 :53 am.  Thank you, Dene Fines, PharmD Clinical Pharmacist  [facility]  Direct Dial: [phone]

## 2023-11-27 ENCOUNTER — Other Ambulatory Visit (HOSPITAL_COMMUNITY): Payer: Self-pay

## 2023-11-27 NOTE — Telephone Encounter (Signed)
 Testosterone  has been approved by the insurance company:

## 2023-12-11 ENCOUNTER — Other Ambulatory Visit: Payer: Self-pay | Admitting: Family Medicine

## 2023-12-14 ENCOUNTER — Other Ambulatory Visit: Payer: Self-pay | Admitting: Family Medicine

## 2023-12-14 DIAGNOSIS — I10 Essential (primary) hypertension: Secondary | ICD-10-CM

## 2023-12-14 MED ORDER — TESTOSTERONE 30 MG/ACT TD SOLN: TRANSDERMAL | 1 refills | Status: DC

## 2023-12-14 NOTE — Telephone Encounter (Signed)
 Copied from CRM [id number]. Topic: Clinical - Medication Refill >> Dec 14, 2023 12:06 PM Robinson H wrote: Medication: Testosterone  30 MG/ACT SOLN  Has the patient contacted their pharmacy? Yes, states no refills (Agent: If no, request that the patient contact the pharmacy for the refill. If patient does not wish to contact the pharmacy document the reason why and proceed with request.) (Agent: If yes, when and what did the pharmacy advise?)  This is the patient's preferred pharmacy:  [facility] - [place], [place] - [address] [place] [address] Phone: [phone] Fax: [phone]    Is this the correct pharmacy for this prescription? Yes If no, delete pharmacy and type the correct one.   Has the prescription been filled recently? No  Is the patient out of the medication? No  Has the patient been seen for an appointment in the last year OR does the patient have an upcoming appointment? Yes  Can we respond through MyChart? No  Agent: Please be advised that Rx refills may take up to 3 business days. We ask that you follow-up with your pharmacy.

## 2023-12-14 NOTE — Telephone Encounter (Signed)
 Requesting: testosterone   Contract: n/a UDS: n/a  Last Visit: 10/16/23 Next Visit: 04/18/24 Last Refill: 11/14/23 #1102mL and 1RF  Please Advise

## 2023-12-16 ENCOUNTER — Other Ambulatory Visit: Payer: Self-pay | Admitting: Family

## 2023-12-16 ENCOUNTER — Other Ambulatory Visit: Payer: Self-pay | Admitting: Family Medicine

## 2023-12-26 ENCOUNTER — Other Ambulatory Visit

## 2023-12-28 ENCOUNTER — Other Ambulatory Visit: Payer: Self-pay | Admitting: Family

## 2023-12-29 NOTE — Telephone Encounter (Signed)
 Copied from CRM [id number]. Topic: Clinical - Medication Refill >> Dec 29, 2023 11:18 AM Harlene ORN wrote: Medication:  zolpidem  (AMBIEN ) 10 MG tablet   Has the patient contacted their pharmacy? Yes (Agent: If no, request that the patient contact the pharmacy for the refill. If patient does not wish to contact the pharmacy document the reason why and proceed with request.) (Agent: If yes, when and what did the pharmacy advise?)  This is the patient's preferred pharmacy:  [facility] - [place], [place] - [address] [place] [address] Phone: [phone] Fax: [phone]  [id number] [place] [place] - [address] [place] [address] Phone: [phone] Fax: [phone]  Is this the correct pharmacy for this prescription? Yes If no, delete pharmacy and type the correct one.   Has the prescription been filled recently? No  Is the patient out of the medication? Yes  Has the patient been seen for an appointment in the last year OR does the patient have an upcoming appointment? Yes  Can we respond through MyChart? No  Agent: Please be advised that Rx refills may take up to 3 business days. We ask that you follow-up with your pharmacy.  Prior Authorization is needed.

## 2023-12-29 NOTE — Telephone Encounter (Signed)
 Requesting: Ambien  10mg   Contract: 10/20/22 UDS: 04/18/23 Last Visit: 10/16/23 Next Visit: 04/18/24 Last Refill: 10/05/23 #30 and 1rf   Please Advise

## 2024-01-01 ENCOUNTER — Other Ambulatory Visit: Payer: Self-pay | Admitting: Family Medicine

## 2024-01-01 NOTE — Telephone Encounter (Signed)
 Requesting: lorazepam  0.5mg   Contract: 10/20/22 UDS: 04/18/23 Last Visit: 10/16/23 Next Visit: 04/18/24 Last Refill: 04/18/23 #30 and 2RF   Please Advise

## 2024-01-04 ENCOUNTER — Other Ambulatory Visit

## 2024-01-08 ENCOUNTER — Telehealth: Payer: Self-pay | Admitting: Family Medicine

## 2024-01-08 ENCOUNTER — Telehealth: Payer: Self-pay

## 2024-01-08 DIAGNOSIS — E039 Hypothyroidism, unspecified: Secondary | ICD-10-CM

## 2024-01-08 NOTE — Telephone Encounter (Signed)
 Copied from CRM [id number]. Topic: General - Other >> Jan 08, 2024 12:43 PM Lavanda D wrote: Reason for CRM: Patient is calling because he had thought that Dr. Domenica wanted him to get his TSH level checked, he does not currently have an active order but would like to confirm whether or not labs are needed at the moment. Please call back if needed.

## 2024-01-08 NOTE — Telephone Encounter (Signed)
 Appointment cancelled, someone scheduled it in error

## 2024-01-08 NOTE — Telephone Encounter (Signed)
 Good morning I need some labs for this pt

## 2024-01-09 ENCOUNTER — Other Ambulatory Visit

## 2024-01-09 ENCOUNTER — Other Ambulatory Visit (INDEPENDENT_AMBULATORY_CARE_PROVIDER_SITE_OTHER)

## 2024-01-09 ENCOUNTER — Ambulatory Visit: Payer: Self-pay | Admitting: Family Medicine

## 2024-01-09 DIAGNOSIS — E039 Hypothyroidism, unspecified: Secondary | ICD-10-CM | POA: Diagnosis not present

## 2024-01-09 LAB — TSH: TSH: 2.4 u[IU]/mL (ref 0.35–5.50)

## 2024-01-09 LAB — T4, FREE: Free T4: 1.17 ng/dL (ref 0.60–1.60)

## 2024-01-09 NOTE — Telephone Encounter (Signed)
 Patient was rescheduled for today,01/09/24 for his TSH and T4.

## 2024-01-19 ENCOUNTER — Other Ambulatory Visit: Payer: Self-pay | Admitting: Family Medicine

## 2024-01-19 MED ORDER — SILDENAFIL CITRATE 20 MG PO TABS: ORAL_TABLET | ORAL | 1 refills | Status: DC

## 2024-01-19 NOTE — Telephone Encounter (Signed)
 Copied from CRM [id number]. Topic: Clinical - Medication Refill >> Jan 19, 2024  9:19 AM Carlatta H wrote: Medication: sildenafil  (REVATIO ) 20 MG tablet [Pharmacy Med Name: SILDENAFIL  CITRATE 20 MG TAB] [[id number]]  Has the patient contacted their pharmacy? No (Agent: If no, request that the patient contact the pharmacy for the refill. If patient does not wish to contact the pharmacy document the reason why and proceed with request.) (Agent: If yes, when and what did the pharmacy advise?)  This is the patient's preferred pharmacy:  [facility] - [place] - [address] [place] [address] Phone: [phone] Fax: [phone]    Is this the correct pharmacy for this prescription? Yes If no, delete pharmacy and type the correct one.   Has the prescription been filled recently? No  Is the patient out of the medication? Yes  Has the patient been seen for an appointment in the last year OR does the patient have an upcoming appointment? Yes  Can we respond through MyChart? No  Agent: Please be advised that Rx refills may take up to 3 business days. We ask that you follow-up with your pharmacy.

## 2024-02-06 DIAGNOSIS — L570 Actinic keratosis: Secondary | ICD-10-CM | POA: Diagnosis not present

## 2024-02-06 DIAGNOSIS — D2261 Melanocytic nevi of right upper limb, including shoulder: Secondary | ICD-10-CM | POA: Diagnosis not present

## 2024-02-06 DIAGNOSIS — L821 Other seborrheic keratosis: Secondary | ICD-10-CM | POA: Diagnosis not present

## 2024-02-06 DIAGNOSIS — Z129 Encounter for screening for malignant neoplasm, site unspecified: Secondary | ICD-10-CM | POA: Diagnosis not present

## 2024-02-06 DIAGNOSIS — Z85828 Personal history of other malignant neoplasm of skin: Secondary | ICD-10-CM | POA: Diagnosis not present

## 2024-02-06 DIAGNOSIS — L218 Other seborrheic dermatitis: Secondary | ICD-10-CM | POA: Diagnosis not present

## 2024-02-06 DIAGNOSIS — Z808 Family history of malignant neoplasm of other organs or systems: Secondary | ICD-10-CM | POA: Diagnosis not present

## 2024-02-16 ENCOUNTER — Other Ambulatory Visit: Payer: Self-pay | Admitting: Family Medicine

## 2024-04-14 NOTE — Assessment & Plan Note (Signed)
 Supplement and monitor

## 2024-04-14 NOTE — Assessment & Plan Note (Signed)
 Well controlled, no changes to meds. Encouraged heart healthy diet such as the DASH diet and exercise as tolerated.

## 2024-04-14 NOTE — Assessment & Plan Note (Signed)
 On Levothyroxine, continue to monitor

## 2024-04-14 NOTE — Assessment & Plan Note (Signed)
 Encourage heart healthy diet such as MIND or DASH diet, increase exercise, avoid trans fats, simple carbohydrates and processed foods, consider a krill or fish or flaxseed oil cap daily. Tolerating Atorvastatin

## 2024-04-14 NOTE — Assessment & Plan Note (Signed)
Rate controlled, tolerating meds 

## 2024-04-14 NOTE — Assessment & Plan Note (Signed)
 hgba1c acceptable, minimize simple carbs. Increase exercise as tolerated.

## 2024-04-14 NOTE — Assessment & Plan Note (Signed)
Tolerating Eliquis 

## 2024-04-18 ENCOUNTER — Encounter: Payer: Self-pay | Admitting: Family Medicine

## 2024-04-18 ENCOUNTER — Ambulatory Visit: Admitting: Family Medicine

## 2024-04-18 ENCOUNTER — Other Ambulatory Visit (HOSPITAL_BASED_OUTPATIENT_CLINIC_OR_DEPARTMENT_OTHER): Payer: Self-pay

## 2024-04-18 VITALS — BP 110/64 | Ht 70.0 in | Wt 163.6 lb

## 2024-04-18 DIAGNOSIS — E785 Hyperlipidemia, unspecified: Secondary | ICD-10-CM

## 2024-04-18 DIAGNOSIS — I480 Paroxysmal atrial fibrillation: Secondary | ICD-10-CM | POA: Diagnosis not present

## 2024-04-18 DIAGNOSIS — N289 Disorder of kidney and ureter, unspecified: Secondary | ICD-10-CM

## 2024-04-18 DIAGNOSIS — I4891 Unspecified atrial fibrillation: Secondary | ICD-10-CM

## 2024-04-18 DIAGNOSIS — Z23 Encounter for immunization: Secondary | ICD-10-CM | POA: Diagnosis not present

## 2024-04-18 DIAGNOSIS — D6869 Other thrombophilia: Secondary | ICD-10-CM

## 2024-04-18 DIAGNOSIS — I10 Essential (primary) hypertension: Secondary | ICD-10-CM

## 2024-04-18 DIAGNOSIS — R739 Hyperglycemia, unspecified: Secondary | ICD-10-CM

## 2024-04-18 DIAGNOSIS — E039 Hypothyroidism, unspecified: Secondary | ICD-10-CM

## 2024-04-18 DIAGNOSIS — E291 Testicular hypofunction: Secondary | ICD-10-CM

## 2024-04-18 MED ORDER — PANTOPRAZOLE SODIUM 40 MG PO TBEC: 40.0000 mg | DELAYED_RELEASE_TABLET | Freq: Two times a day (BID) | ORAL | 1 refills | Status: AC

## 2024-04-18 MED ORDER — SILDENAFIL CITRATE 20 MG PO TABS: ORAL_TABLET | ORAL | 1 refills | Status: AC

## 2024-04-18 MED ORDER — ATORVASTATIN CALCIUM 20 MG PO TABS: 20.0000 mg | ORAL_TABLET | Freq: Every day | ORAL | 1 refills | Status: DC

## 2024-04-18 MED ORDER — COMIRNATY 30 MCG/0.3ML IM SUSY
0.3000 mL | PREFILLED_SYRINGE | Freq: Once | INTRAMUSCULAR | 0 refills | Status: AC
  Filled 2024-04-18: qty 0.3, 1d supply, fill #0

## 2024-04-18 MED ORDER — DUTASTERIDE-TAMSULOSIN HCL 0.5-0.4 MG PO CAPS: 1.0000 | ORAL_CAPSULE | Freq: Every day | ORAL | 1 refills | Status: DC

## 2024-04-18 MED ORDER — METOPROLOL SUCCINATE ER 25 MG PO TB24: 25.0000 mg | ORAL_TABLET | Freq: Every day | ORAL | 1 refills | Status: AC

## 2024-04-18 MED ORDER — TESTOSTERONE 30 MG/ACT TD SOLN: TRANSDERMAL | 1 refills | Status: DC

## 2024-04-18 MED ORDER — MIRTAZAPINE 15 MG PO TABS
15.0000 mg | ORAL_TABLET | Freq: Every day | ORAL | 1 refills | Status: AC
Start: 2024-04-18 — End: ?

## 2024-04-18 MED ORDER — ZOLPIDEM TARTRATE 10 MG PO TABS: 10.0000 mg | ORAL_TABLET | Freq: Every evening | ORAL | 5 refills | Status: AC | PRN

## 2024-04-18 MED ORDER — APIXABAN 5 MG PO TABS
5.0000 mg | ORAL_TABLET | Freq: Two times a day (BID) | ORAL | 1 refills | Status: AC
Start: 2024-04-18 — End: ?

## 2024-04-18 NOTE — Patient Instructions (Addendum)
 Tetanus is due  Prevnar 20  Annual covid and flu vaccine.      All [facility] pharmacies are now walk in vaccine clinics M-F 9-4

## 2024-04-19 LAB — COMPREHENSIVE METABOLIC PANEL WITH GFR
ALT: 15 U/L (ref 0–53)
AST: 20 U/L (ref 0–37)
Albumin: 4.2 g/dL (ref 3.5–5.2)
Alkaline Phosphatase: 54 U/L (ref 39–117)
BUN: 13 mg/dL (ref 6–23)
CO2: 30 meq/L (ref 19–32)
Calcium: 9 mg/dL (ref 8.4–10.5)
Chloride: 95 meq/L — ABNORMAL LOW (ref 96–112)
Creatinine, Ser: 1.21 mg/dL (ref 0.40–1.50)
GFR: 55.04 mL/min — ABNORMAL LOW (ref >60.00)
Glucose, Bld: 92 mg/dL (ref 70–99)
Potassium: 4.7 meq/L (ref 3.5–5.1)
Sodium: 131 meq/L — ABNORMAL LOW (ref 135–145)
Total Bilirubin: 0.9 mg/dL (ref 0.2–1.2)
Total Protein: 6.8 g/dL (ref 6.0–8.3)

## 2024-04-19 LAB — LIPID PANEL
Cholesterol: 105 mg/dL (ref 0–200)
HDL: 52.1 mg/dL (ref >39.00)
LDL Cholesterol: 40 mg/dL (ref 0–99)
NonHDL: 53.08
Total CHOL/HDL Ratio: 2
Triglycerides: 63 mg/dL (ref 0.0–149.0)
VLDL: 12.6 mg/dL (ref 0.0–40.0)

## 2024-04-19 LAB — CBC WITH DIFFERENTIAL/PLATELET
Basophils Absolute: 0 K/uL (ref 0.0–0.1)
Basophils Relative: 0.7 % (ref 0.0–3.0)
Eosinophils Absolute: 0.1 K/uL (ref 0.0–0.7)
Eosinophils Relative: 2 % (ref 0.0–5.0)
HCT: 40.6 % (ref 39.0–52.0)
Hemoglobin: 14.1 g/dL (ref 13.0–17.0)
Lymphocytes Relative: 35 % (ref 12.0–46.0)
Lymphs Abs: 2.4 K/uL (ref 0.7–4.0)
MCHC: 34.7 g/dL (ref 30.0–36.0)
MCV: 91.9 fl (ref 78.0–100.0)
Monocytes Absolute: 0.9 K/uL (ref 0.1–1.0)
Monocytes Relative: 12.4 % — ABNORMAL HIGH (ref 3.0–12.0)
Neutro Abs: 3.4 K/uL (ref 1.4–7.7)
Neutrophils Relative %: 49.9 % (ref 43.0–77.0)
Platelets: 199 K/uL (ref 150.0–400.0)
RBC: 4.41 Mil/uL (ref 4.22–5.81)
RDW: 12.8 % (ref 11.5–15.5)
WBC: 6.9 K/uL (ref 4.0–10.5)

## 2024-04-19 LAB — HEMOGLOBIN A1C: Hgb A1c MFr Bld: 5.5 % (ref 4.6–6.5)

## 2024-04-19 LAB — TSH: TSH: 1.69 u[IU]/mL (ref 0.35–5.50)

## 2024-04-21 ENCOUNTER — Ambulatory Visit: Payer: Self-pay | Admitting: Family Medicine

## 2024-04-21 ENCOUNTER — Encounter: Payer: Self-pay | Admitting: Family Medicine

## 2024-04-21 DIAGNOSIS — E871 Hypo-osmolality and hyponatremia: Secondary | ICD-10-CM

## 2024-04-21 NOTE — Progress Notes (Signed)
 Subjective:    Patient ID: James [name], male    DOB: 1941/03/24, 83 y.o.   MRN: [id number]  Chief Complaint  Patient presents with   Follow-up    No concerns     HPI Discussed the use of AI scribe software for clinical note transcription with the patient, who gave verbal consent to proceed.  History of Present Illness James [name] is an 83 year old male who presents for a routine follow-up and vaccination update.  He has no recent emergency room visits, abdominal issues, or chest pain. He has previously received RSV, pneumonia, and shingles vaccines. He received the two pneumonia vaccines, Prevnar 13 and Pneumovax, in his late sixties to early 28. He is ten years out from his last pneumonia vaccines.  He has received his annual COVID and flu vaccines, with the COVID booster taken two weeks ago and the flu shot a few weeks prior.  He inquires about the necessity of a colonoscopy, given his age.  His current medications include metoprolol , levothyroxine , tamsulosin -dutasteride  combo, mirtazapine , atorvastatin , pantoprazole , Eliquis , zolpidem , lorazepam , and sildenafil . He needs refills on most of these medications, except for metoprolol  and levothyroxine , which have some refills remaining.  He recently returned from an [place]  cruise and is working on a book about barns and houses, indicating an active lifestyle and engagement in personal projects.    Past Medical History:  Diagnosis Date   AAA (abdominal aortic aneurysm) without rupture 09/08/2013   Abdominal bloating 05/19/2016   Anxiety state 05/18/2014   Atrial fibrillation (HCC)    BCC (basal cell carcinoma of skin) 06/09/2014   Benign paroxysmal positional vertigo 04/06/2013   CAD (coronary artery disease)    nonobstructive by cath 2008; nonobstructive by Cardiac CT 11/2011   Cervical spine fracture (HCC) 1980   C6-7   Diverticulosis    Erectile dysfunction 09/08/2013   GERD (gastroesophageal reflux disease)     Hiatal hernia    History of atrial fibrillation    History of squamous cell carcinoma 10-05-10   Dr Dagoberto Carnes, [place]    HLD (hyperlipidemia)    Hypothyroidism    Medicare annual wellness visit, subsequent 03/16/2014   Sees [facility] Follows with Dr Delford of cardiology Sees [facility]es [facility] for colonoscopy, last colonoscopy in 2013    Mitral valve prolapse    Neck pain 05/03/2015   Preventative health care 03/16/2014   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   s/p excision   Syncope 02/03/2013   Testosterone  deficiency 08/05/2012    Past Surgical History:  Procedure Laterality Date   APPENDECTOMY  05/2011   CARDIAC CATHETERIZATION  01/03/12   30% ostial LAD stenosis, ostial 30-40% D1 stenosis, smooth and normal left main and RCA; LVEF 65%   CHOLECYSTECTOMY N/A 08/25/2014   Procedure: LAPAROSCOPIC CHOLECYSTECTOMY WITH INTRAOPERATIVE CHOLANGIOGRAM;  Surgeon: Krystal Russell, MD;  Location: [facility] OR;  Service: General;  Laterality: N/A;   INGUINAL HERNIA REPAIR  07/2010   right   KNEE ARTHROPLASTY     bilat   LEFT HEART CATH AND CORONARY ANGIOGRAPHY N/A 10/01/2021   Procedure: LEFT HEART CATH AND CORONARY ANGIOGRAPHY;  Surgeon: Claudene Victory ORN, MD;  Location: [facility] INVASIVE CV LAB;  Service: Cardiovascular;  Laterality: N/A;   LEFT HEART CATHETERIZATION WITH CORONARY ANGIOGRAM N/A 01/03/2012   Procedure: LEFT HEART CATHETERIZATION WITH CORONARY ANGIOGRAM;  Surgeon: Aleene JINNY Passe, MD;  Location: [facility] CATH LAB;  Service: Cardiovascular;  Laterality: N/A;   MRI  10/31/2016   TONSILLECTOMY  1947   tumor removal  1958   fatty tumor    Family History  Problem Relation Age of Onset   Heart disease Mother        MI 71s   Stroke Mother    Hypertension Mother    Heart disease Maternal Grandfather        MI 25-80s   Heart disease Maternal Grandmother        MI 65-80s   Lung disease Father        penumonitis   Cancer Sister        colon   Cancer Paternal Uncle        GI: colon he  thinks    Social History   Socioeconomic History   Marital status: Single    Spouse name: Not on file   Number of children: Not on file   Years of education: Not on file   Highest education level: Not on file  Occupational History   Occupation: art gallery manager    Comment: Retired  Tobacco Use   Smoking status: Never   Smokeless tobacco: Never  Vaping Use   Vaping status: Never Used  Substance and Sexual Activity   Alcohol use: Yes    Alcohol/week: 14.0 standard drinks of alcohol    Types: 14 Glasses of wine per week    Comment: 2 glasses red wine/day   Drug use: No   Sexual activity: Yes  Other Topics Concern   Not on file  Social History Narrative   Not on file   Social Drivers of Health   Financial Resource Strain: Low Risk  (10/03/2023)   Overall Financial Resource Strain (CARDIA)    Difficulty of Paying Living Expenses: Not hard at all  Food Insecurity: No Food Insecurity (10/03/2023)   Hunger Vital Sign    Worried About Running Out of Food in the Last Year: Never true    Ran Out of Food in the Last Year: Never true  Transportation Needs: No Transportation Needs (10/03/2023)   PRAPARE - Administrator, Civil Service (Medical): No    Lack of Transportation (Non-Medical): No  Physical Activity: Sufficiently Active (10/03/2023)   Exercise Vital Sign    Days of Exercise per Week: 4 days    Minutes of Exercise per Session: 60 min  Stress: No Stress Concern Present (10/03/2023)   [employer] of Occupational Health - Occupational Stress Questionnaire    Feeling of Stress : Not at all  Social Connections: Moderately Integrated (10/03/2023)   Social Connection and Isolation Panel    Frequency of Communication with Friends and Family: More than three times a week    Frequency of Social Gatherings with Friends and Family: More than three times a week    Attends Religious Services: More than 4 times per year    Active Member of [employer] or Organizations: Yes     Attends Engineer, Structural: More than 4 times per year    Marital Status: Divorced  Intimate Partner Violence: Not At Risk (10/03/2023)   Humiliation, Afraid, Rape, and Kick questionnaire    Fear of Current or Ex-Partner: No    Emotionally Abused: No    Physically Abused: No    Sexually Abused: No    Outpatient Medications Prior to Visit  Medication Sig Dispense Refill   ASHWAGANDHA PO Take 1 tablet by mouth daily.     Calcium  Carb-Cholecalciferol (CALCIUM  1000 + D PO) Take 1 tablet by mouth 3 (three) times a week.  COLLAGEN PO Take 1 capsule by mouth every other day. (Chewable - has hyaluronic acid, collagen and biotin)     Cyanocobalamin  (VITAMIN B 12 PO) Take 3,000 mcg by mouth daily.     diclofenac  Sodium (VOLTAREN ) 1 % GEL Apply 4 g topically 4 (four) times daily. To affected joint. (Patient taking differently: Apply 4 g topically as needed. To affected joint.) 100 g 11   ELDERBERRY PO Take 50 mg by mouth. 4 or 5 times per week     fluticasone  (FLONASE ) 50 MCG/ACT nasal spray Place 2 sprays into both nostrils daily. 16 g 6   guaiFENesin  (MUCINEX ) 600 MG 12 hr tablet Take 2 tablets (1,200 mg total) by mouth 2 (two) times daily as needed. 30 tablet 0   levothyroxine  (SYNTHROID ) 100 MCG tablet 1 tab daily x 6 days a week, skip saturdays     LORazepam  (ATIVAN ) 0.5 MG tablet TAKE 1 TABLET BY MOUTH TWICE DAILY AS NEEDED FOR ANXIETY 30 tablet 1   Melatonin 10 MG TABS Take 10 mg by mouth daily.     Milk Thistle 1000 MG CAPS Take 1,000 mg by mouth in the morning.     Multiple Vitamins-Minerals (MULTIVITAMINS THER. W/MINERALS) TABS Take 1 tablet by mouth daily.     Probiotic Product (PROBIOTIC DAILY PO) Take 1 tablet by mouth daily.     Turmeric (QC TUMERIC COMPLEX) 500 MG CAPS Take 1 tablet by mouth daily.     apixaban  (ELIQUIS ) 5 MG TABS tablet Take 1 tablet (5 mg total) by mouth 2 (two) times daily. 60 tablet 5   atorvastatin  (LIPITOR) 20 MG tablet Take 1 tablet (20 mg total)  by mouth daily. 90 tablet 1   Dutasteride -Tamsulosin  HCl 0.5-0.4 MG CAPS TAKE 1 CAPSULE BY MOUTH AT BEDTIME 90 capsule 1   metoprolol  succinate (TOPROL  XL) 25 MG 24 hr tablet Take 1 tablet (25 mg total) by mouth daily. 90 tablet 3   mirtazapine  (REMERON ) 15 MG tablet Take 1 tablet (15 mg total) by mouth at bedtime. 90 tablet 1   pantoprazole  (PROTONIX ) 40 MG tablet Take 1 tablet (40 mg total) by mouth 2 (two) times daily before a meal. 180 tablet 1   sildenafil  (REVATIO ) 20 MG tablet TAKE 1 TO 5 TABLETS BY MOUTH DAILY AS NEEDED 70 tablet 1   Testosterone  30 MG/ACT SOLN APPLY 2 PUMPS TOPICALLY EVERY DAY AS DIRECTED 180 mL 1   zolpidem  (AMBIEN ) 10 MG tablet TAKE 1 TABLET BY MOUTH AT BEDTIME AS NEEDED FOR SLEEP 30 tablet 3   No facility-administered medications prior to visit.    Allergies  Allergen Reactions   Sulfa Antibiotics Other (See Comments)    Childhood allergy    Review of Systems  Constitutional:  Negative for fever and malaise/fatigue.  HENT:  Negative for congestion.   Eyes:  Negative for blurred vision.  Respiratory:  Negative for shortness of breath.   Cardiovascular:  Negative for chest pain, palpitations and leg swelling.  Gastrointestinal:  Negative for abdominal pain, blood in stool and nausea.  Genitourinary:  Negative for dysuria and frequency.  Musculoskeletal:  Negative for falls.  Skin:  Negative for rash.  Neurological:  Negative for dizziness, loss of consciousness and headaches.  Endo/Heme/Allergies:  Negative for environmental allergies.  Psychiatric/Behavioral:  Negative for depression. The patient is not nervous/anxious.        Objective:    Physical Exam Vitals reviewed.  Constitutional:      Appearance: Normal appearance. He is not ill-appearing.  HENT:     Head: Normocephalic and atraumatic.     Nose: Nose normal.  Eyes:     Conjunctiva/sclera: Conjunctivae normal.  Cardiovascular:     Rate and Rhythm: Normal rate.     Pulses: Normal  pulses.     Heart sounds: Normal heart sounds. No murmur heard. Pulmonary:     Effort: Pulmonary effort is normal.     Breath sounds: Normal breath sounds. No wheezing.  Abdominal:     Palpations: Abdomen is soft. There is no mass.     Tenderness: There is no abdominal tenderness.  Musculoskeletal:     Cervical back: Normal range of motion.     Right lower leg: No edema.     Left lower leg: No edema.  Skin:    General: Skin is warm and dry.  Neurological:     General: No focal deficit present.     Mental Status: He is alert and oriented to person, place, and time.  Psychiatric:        Mood and Affect: Mood normal.     BP 110/64 (BP Location: Left Arm, Patient Position: Sitting, Cuff Size: Normal)   Ht 5' 10 (1.778 m)   Wt 163 lb 9.6 oz (74.2 kg)   BMI 23.47 kg/m  Wt Readings from Last 3 Encounters:  04/18/24 163 lb 9.6 oz (74.2 kg)  10/23/23 160 lb 9.6 oz (72.8 kg)  10/16/23 158 lb 3.2 oz (71.8 kg)    Diabetic Foot Exam - Simple   No data filed    Lab Results  Component Value Date   WBC 6.9 04/18/2024   HGB 14.1 04/18/2024   HCT 40.6 04/18/2024   PLT 199.0 04/18/2024   GLUCOSE 92 04/18/2024   CHOL 105 04/18/2024   TRIG 63.0 04/18/2024   HDL 52.10 04/18/2024   LDLCALC 40 04/18/2024   ALT 15 04/18/2024   AST 20 04/18/2024   NA 131 (L) 04/18/2024   K 4.7 04/18/2024   CL 95 (L) 04/18/2024   CREATININE 1.21 04/18/2024   BUN 13 04/18/2024   CO2 30 04/18/2024   TSH 1.69 04/18/2024   PSA 0.40 09/06/2021   INR 1.09 08/21/2014   HGBA1C 5.5 04/18/2024    Lab Results  Component Value Date   TSH 1.69 04/18/2024   Lab Results  Component Value Date   WBC 6.9 04/18/2024   HGB 14.1 04/18/2024   HCT 40.6 04/18/2024   MCV 91.9 04/18/2024   PLT 199.0 04/18/2024   Lab Results  Component Value Date   NA 131 (L) 04/18/2024   K 4.7 04/18/2024   CO2 30 04/18/2024   GLUCOSE 92 04/18/2024   BUN 13 04/18/2024   CREATININE 1.21 04/18/2024   BILITOT 0.9 04/18/2024    ALKPHOS 54 04/18/2024   AST 20 04/18/2024   ALT 15 04/18/2024   PROT 6.8 04/18/2024   ALBUMIN 4.2 04/18/2024   CALCIUM  9.0 04/18/2024   ANIONGAP 11 03/07/2020   EGFR 90.0 05/06/2023   GFR 55.04 (L) 04/18/2024   Lab Results  Component Value Date   CHOL 105 04/18/2024   Lab Results  Component Value Date   HDL 52.10 04/18/2024   Lab Results  Component Value Date   LDLCALC 40 04/18/2024   Lab Results  Component Value Date   TRIG 63.0 04/18/2024   Lab Results  Component Value Date   CHOLHDL 2 04/18/2024   Lab Results  Component Value Date   HGBA1C 5.5 04/18/2024  Assessment & Plan:  Atrial fibrillation, unspecified type [facility]) Assessment & Plan: Rate controlled, tolerating meds   Essential hypertension Assessment & Plan: Well controlled, no changes to meds. Encouraged heart healthy diet such as the DASH diet and exercise as tolerated.   Orders: -     Comprehensive metabolic panel with GFR -     CBC with Differential/Platelet -     TSH -     Testosterone ; APPLY 2 PUMPS TOPICALLY EVERY DAY AS DIRECTED  Dispense: 180 mL; Refill: 1  Hypercoagulable state due to paroxysmal atrial fibrillation [facility]) Assessment & Plan: Tolerating Eliquis    Hyperglycemia Assessment & Plan: hgba1c acceptable, minimize simple carbs. Increase exercise as tolerated.   Orders: -     Hemoglobin A1c -     Dutasteride -Tamsulosin  HCl; Take 1 capsule by mouth at bedtime.  Dispense: 90 capsule; Refill: 1  Hyperlipidemia, unspecified hyperlipidemia type Assessment & Plan: Encourage heart healthy diet such as MIND or DASH diet, increase exercise, avoid trans fats, simple carbohydrates and processed foods, consider a krill or fish or flaxseed oil cap daily. Tolerating Atorvastatin   Orders: -     Lipid panel  Hypothyroidism, unspecified type Assessment & Plan: On Levothyroxine , continue to monitor  Orders: -     TSH  Renal insufficiency Assessment & Plan: Supplement and  monitor  Orders: -     Comprehensive metabolic panel with GFR  Hypogonadism in male -     Testosterone ; APPLY 2 PUMPS TOPICALLY EVERY DAY AS DIRECTED  Dispense: 180 mL; Refill: 1  Need for influenza vaccination -     Flu vaccine HIGH DOSE PF(Fluzone Trivalent)  Other orders -     Mirtazapine ; Take 1 tablet (15 mg total) by mouth at bedtime.  Dispense: 90 tablet; Refill: 1 -     Pantoprazole  Sodium; Take 1 tablet (40 mg total) by mouth 2 (two) times daily before a meal.  Dispense: 180 tablet; Refill: 1 -     Apixaban ; Take 1 tablet (5 mg total) by mouth 2 (two) times daily.  Dispense: 180 tablet; Refill: 1 -     Zolpidem  Tartrate; Take 1 tablet (10 mg total) by mouth at bedtime as needed. for sleep  Dispense: 30 tablet; Refill: 5 -     Sildenafil  Citrate; TAKE 1 TO 5 TABLETS BY MOUTH DAILY AS NEEDED  Dispense: 70 tablet; Refill: 1 -     Atorvastatin  Calcium ; Take 1 tablet (20 mg total) by mouth daily.  Dispense: 90 tablet; Refill: 1 -     Metoprolol  Succinate ER; Take 1 tablet (25 mg total) by mouth daily.  Dispense: 90 tablet; Refill: 1    Assessment and Plan Assessment & Plan Atrial fibrillation and hypercoagulable state Continues on anticoagulation therapy with Eliquis  for hypercoagulable state due to atrial fibrillation. - Continue Eliquis  5 mg orally twice daily. Following with cardiology  Coronary artery disease Well-managed with no recent chest pain or related symptoms. - Continue current medications including atorvastatin  and metoprolol .  Essential hypertension Blood pressure is well-controlled. - Continue metoprolol  succinate 25 mg orally daily.  Hyperlipidemia Managed with atorvastatin . - Continue atorvastatin  20 mg orally daily.  Hypothyroidism and thyroid  nodule Thyroid  function tests to be checked with upcoming lab work. - Order thyroid  function tests. - Hold levothyroxine  refill until lab results are reviewed.  Renal insufficiency Renal function to be  monitored with upcoming lab work. No acute issues reported.  Anemia Anemia status to be evaluated with upcoming lab work. No acute symptoms reported. - Order  complete blood count.  Insomnia Managed with mirtazapine  and zolpidem  as needed. No new concerns reported. - Continue mirtazapine  15 mg orally at bedtime. - Continue zolpidem  10 mg orally at bedtime as needed.  Testosterone  deficiency Managed with topical testosterone . No new symptoms reported. - Continue testosterone  30 mg/act topical daily.  Recording duration: 32 minutes     Harlene Horton, MD

## 2024-04-23 NOTE — Telephone Encounter (Signed)
 Returned pt's call and he was scheduled for lab repeat on 05/21/24 @ 1:45 PM and CMP lab was ordered.

## 2024-04-23 NOTE — Telephone Encounter (Signed)
 Copied from CRM #[id number]. Topic: Clinical - Request for Lab/Test Order >> Apr 23, 2024  3:04 PM Berneda FALCON wrote: Reason for CRM: Patient calling to go over lab results and wanting to schedule for the repeat labs but there is no order in the chart for this yet. Can we place the order so we can get him scheduled, please?  [phone] (home)

## 2024-04-23 NOTE — Progress Notes (Signed)
 Called pt and no answer left vm to return call.

## 2024-05-21 ENCOUNTER — Other Ambulatory Visit (INDEPENDENT_AMBULATORY_CARE_PROVIDER_SITE_OTHER)

## 2024-05-21 ENCOUNTER — Other Ambulatory Visit: Payer: Self-pay | Admitting: Family

## 2024-05-21 DIAGNOSIS — E871 Hypo-osmolality and hyponatremia: Secondary | ICD-10-CM | POA: Diagnosis not present

## 2024-05-22 ENCOUNTER — Ambulatory Visit: Payer: Self-pay | Admitting: Family Medicine

## 2024-05-22 LAB — COMPREHENSIVE METABOLIC PANEL WITH GFR
ALT: 15 U/L (ref 0–53)
AST: 19 U/L (ref 0–37)
Albumin: 4.1 g/dL (ref 3.5–5.2)
Alkaline Phosphatase: 54 U/L (ref 39–117)
BUN: 17 mg/dL (ref 6–23)
CO2: 29 meq/L (ref 19–32)
Calcium: 9.1 mg/dL (ref 8.4–10.5)
Chloride: 98 meq/L (ref 96–112)
Creatinine, Ser: 0.91 mg/dL (ref 0.40–1.50)
GFR: 77.42 mL/min (ref >60.00)
Glucose, Bld: 104 mg/dL — ABNORMAL HIGH (ref 70–99)
Potassium: 4.7 meq/L (ref 3.5–5.1)
Sodium: 135 meq/L (ref 135–145)
Total Bilirubin: 0.8 mg/dL (ref 0.2–1.2)
Total Protein: 6.7 g/dL (ref 6.0–8.3)

## 2024-05-24 NOTE — Telephone Encounter (Signed)
 Requesting: lorazepam  0.5mg   Contract: 10/20/22 UDS: 04/18/23 Last Visit: 04/18/24 Next Visit:  10/21/24 Last Refill: 01/02/24 #30 and 1RF   Please Advise

## 2024-05-30 NOTE — Progress Notes (Unsigned)
 Cardiology Office Note:   Date:  05/31/2024  ID:  James [name], DOB 1940/10/01, MRN [id number] PCP:  Domenica Harlene LABOR, MD  [facility] Providers Cardiologist:  Wendel Haws, MD Referring MD: Domenica Harlene LABOR, MD  Chief Complaint/Reason for Referral: Follow-up paroxysmal atrial fibrillation ASSESSMENT:    1. Paroxysmal atrial fibrillation (HCC)   2. Hypercoagulable state due to paroxysmal atrial fibrillation (HCC)   3. Coronary vasospasm   4. Dilation of aorta   5. Hyperlipidemia LDL goal <70   6. Abdominal aortic aneurysm (AAA) without rupture, unspecified part     PLAN:   In order of problems listed above: Paroxysmal atrial fibrillation: Continue Eliquis  5 mg twice daily, Toprol  25 mg daily Hypercoagulable state: Continue Eliquis  5 mg twice daily Coronary vasospasm: Nonobstructive coronary artery disease no evidence of coronary microvascular dysfunction.  However patient's chest pain symptoms improved on Imdur .  This was changed to amlodipine  due to concomitant use of sildenafil .  He has not been taking amlodipine  and he has not had any chest pain.  If he develops any recurrent chest pain would restart amlodipine . Aortic dilatation: Echocardiogram in May demonstrated stable aortic dimensions of 41 mmHg.  Obtain echocardiogram in 1 year Hyperlipidemia: Continue atorvastatin  20 mg.  LDL checked recently by PCP was 40.  Defer LP(a) testing in this advanced age individual. Abdominal aortic aneurysm: Due for abdominal ultrasound; will order now            Dispo:  Return in about 1 year (around 05/31/2025).       I spent 35 minutes reviewing all clinical data during and prior to this visit including all relevant imaging studies, laboratories, clinical information from other health systems and prior notes from both Cardiology and other specialties, interviewing the patient, conducting a complete physical examination, and coordinating care in order to formulate a comprehensive and  personalized evaluation and treatment plan.   History of Present Illness:    FOCUSED PROBLEM LIST:   Coronary artery disease  Chest pain  LHC 2008, 2013: Nonobstructive CAD Cardiac catheterization 10/01/21: No CAD EF 55 Cardiac MRI 10/27/2021: EF 55, no LGE, RVEF 45, mild AI, ascending aorta 41 mm Myocardial PET 02/16/23: no ischemia, no infarction, EF 50, normal myocardial blood flow reserve, +Coronary Ca2+ [no signs of microvascular dysfunction] Vasospasm? >>  Symptoms improved with amlodipine  Atrial tachycardia Eval by EP in past - Dr. Nancey Paroxysmal atrial fibrillation  Monitor 12/2022: NSR, NSVT, SVT runs (13 beats); A-fib <1% MV Prolapse  TTE 10/26/22: EF 55-60, no RWMA, mild LVH, GR 1 DD, normal RVSF, RVSP 24.9, trivial MR, mild AI, ascending aorta 41 mm, RAP 3 Dilated ascending aorta  Hyperlipidemia  Hypertension  Carotid artery disease US  04/19/2019: Bilateral ICA 1-39 Abdominal aortic aneurysm US  in 2016: 3 cm US  in 03/2020: 2.6 cm (rec to repeat in 2-3 years)  June 2024: The patient is a 83 y.o. male with the indicated medical history here for a doctor of the day appointment.  The patient is normally seen by Dr. Hobart.  The patient has a long history of lightheadedness with intermittent chest discomfort.  He has undergone a number of cardiac procedures most notably coronary angiography last year which demonstrated mild disease, cardiac MRI which showed normal function with borderline RV systolic function, and exercise treadmill stress test which showed atrial tachycardia, cardiac monitor which demonstrated occasional PACs and PVCs supraventricular tachycardia, and echocardiography which confirmed normal LV function with only mild aortic insufficiency.  He was seen by Dr.  Pemberton in October 2023 and was doing fairly well.  His metoprolol  12.5 mg twice daily was continued.  His Imdur  was stopped.  He had seen EP for recommendations and they suggested continue medical  therapy.   The patient is a competitive speed walker.  He tells me that on occasion he will develop some chest pain when he starts walking and will have to slow down a bit.  Chest pain will improve.  He will complete his walk.  After he stops he will get chest pain and lightheaded at times.  He occasionally gets chest pain at rest on occasion as well.  He tells me that sometimes when he gets lightheaded he has not eaten anything prior to doing his 2 mile walks.  He fortunately has not had any frank syncope.  Plan: Will obtain monitor and PET stress test.  December 2025: In the interim for PET stress test demonstrated reassuring findings.  His monitor demonstrated atrial fibrillation.  He was started on Eliquis , Toprol , and referred to atrial fibrillation clinic.  Does not look like he saw atrial fibrillation clinic.  He has been seen in cardiology follow-up multiple times.  He was started on Imdur  which improved chest pain.  This was started for empiric therapy of vasospasm.  He was switched to amlodipine  due to concomitant sildenafil  use.  He was last seen in May of this year.  He was doing well at that time.  Initially managed with Imdur , his medication was switched to amlodipine  due to concurrent use of sildenafil , which he is no longer taking. No current chest pain is reported.  He experiences occasional lightheadedness upon standing. He manages this by standing up slowly and sometimes sitting back down if needed. This is not a frequent occurrence.  No issues with current medications are reported, and no refills are needed at this time. He is eating well, exercising, and sleeping well, and describes his life as happy.        Current Medications: Active Medications[1]   Review of Systems:   Please see the history of present illness.    All other systems reviewed and are negative.     EKGs/Labs/Other Test Reviewed:   EKG: 2024 sinus bradycardia  EKG Interpretation Date/Time:  Friday  May 31 2024 09:02:15 EST Ventricular Rate:  56 PR Interval:  160 QRS Duration:  112 QT Interval:  464 QTC Calculation: 447 R Axis:   44  Text Interpretation: Sinus bradycardia with Premature supraventricular complexes When compared with ECG of 05-Jan-2023 14:45, Premature supraventricular complexes are now Present Confirmed by Wendel Haws ([id number]) on 05/31/2024 9:05:59 AM        CARDIAC STUDIES: Refer to CV Procedures and Imaging Tabs   Risk Assessment/Calculations:    CHA2DS2-VASc Score = 3   This indicates a 3.2% annual risk of stroke. The patient's score is based upon: CHF History: 0 HTN History: 1 Diabetes History: 0 Stroke History: 0 Vascular Disease History: 0 Age Score: 2 Gender Score: 0         Physical Exam:   VS:  BP 100/70 (BP Location: Right Arm, Patient Position: Sitting, Cuff Size: Normal)   Pulse (!) 56   Ht 5' 10 (1.778 m)   Wt 153 lb 2 oz (69.5 kg)   BMI 21.97 kg/m        Wt Readings from Last 3 Encounters:  05/31/24 153 lb 2 oz (69.5 kg)  04/18/24 163 lb 9.6 oz (74.2 kg)  10/23/23 160 lb 9.6 oz (  72.8 kg)      GENERAL:  No apparent distress, AOx3 HEENT:  No carotid bruits, +2 carotid impulses, no scleral icterus CAR: RRR no murmurs, gallops, rubs, or thrills RES:  Clear to auscultation bilaterally ABD:  Soft, nontender, nondistended, positive bowel sounds x 4 VASC:  +2 radial pulses, +2 carotid pulses NEURO:  CN 2-12 grossly intact; motor and sensory grossly intact PSYCH:  No active depression or anxiety EXT:  No edema, ecchymosis, or cyanosis  Signed, Lurena MARLA Red, MD  05/31/2024 9:21 AM    [facility] [address] [place], [place], [place]  [address] Phone: [phone]; Fax: [phone]   Note:  This document was prepared using Dragon voice recognition software and may include unintentional dictation errors.     [1]  Current Meds  Medication Sig   apixaban  (ELIQUIS ) 5 MG TABS tablet Take 1 tablet  (5 mg total) by mouth 2 (two) times daily.   ASHWAGANDHA PO Take 1 tablet by mouth daily.   atorvastatin  (LIPITOR) 20 MG tablet Take 1 tablet (20 mg total) by mouth daily.   Calcium  Carb-Cholecalciferol (CALCIUM  1000 + D PO) Take 1 tablet by mouth 3 (three) times a week.    COLLAGEN PO Take 1 capsule by mouth every other day. (Chewable - has hyaluronic acid, collagen and biotin)   Cyanocobalamin  (VITAMIN B 12 PO) Take 3,000 mcg by mouth daily.   diclofenac  Sodium (VOLTAREN ) 1 % GEL Apply 4 g topically 4 (four) times daily. To affected joint.   Dutasteride -Tamsulosin  HCl 0.5-0.4 MG CAPS Take 1 capsule by mouth at bedtime.   ELDERBERRY PO Take 50 mg by mouth. 4 or 5 times per week   fluticasone  (FLONASE ) 50 MCG/ACT nasal spray Place 2 sprays into both nostrils daily.   guaiFENesin  (MUCINEX ) 600 MG 12 hr tablet Take 2 tablets (1,200 mg total) by mouth 2 (two) times daily as needed.   levothyroxine  (SYNTHROID ) 100 MCG tablet 1 tab daily x 6 days a week, skip saturdays   LORazepam  (ATIVAN ) 0.5 MG tablet TAKE 1 TABLET BY MOUTH 2 TIMES A DAY AS NEEDED FOR ANXIETY   Melatonin 10 MG TABS Take 10 mg by mouth daily.   metoprolol  succinate (TOPROL  XL) 25 MG 24 hr tablet Take 1 tablet (25 mg total) by mouth daily.   Milk Thistle 1000 MG CAPS Take 1,000 mg by mouth in the morning.   mirtazapine  (REMERON ) 15 MG tablet Take 1 tablet (15 mg total) by mouth at bedtime.   Multiple Vitamins-Minerals (MULTIVITAMINS THER. W/MINERALS) TABS Take 1 tablet by mouth daily.   pantoprazole  (PROTONIX ) 40 MG tablet Take 1 tablet (40 mg total) by mouth 2 (two) times daily before a meal.   Probiotic Product (PROBIOTIC DAILY PO) Take 1 tablet by mouth daily.   sildenafil  (REVATIO ) 20 MG tablet TAKE 1 TO 5 TABLETS BY MOUTH DAILY AS NEEDED   Testosterone  30 MG/ACT SOLN APPLY 2 PUMPS TOPICALLY EVERY DAY AS DIRECTED   zolpidem  (AMBIEN ) 10 MG tablet Take 1 tablet (10 mg total) by mouth at bedtime as needed. for sleep

## 2024-05-31 ENCOUNTER — Encounter: Payer: Self-pay | Admitting: Internal Medicine

## 2024-05-31 ENCOUNTER — Ambulatory Visit: Attending: Internal Medicine | Admitting: Internal Medicine

## 2024-05-31 VITALS — BP 100/70 | HR 56 | Ht 70.0 in | Wt 153.1 lb

## 2024-05-31 DIAGNOSIS — D6869 Other thrombophilia: Secondary | ICD-10-CM | POA: Diagnosis not present

## 2024-05-31 DIAGNOSIS — I480 Paroxysmal atrial fibrillation: Secondary | ICD-10-CM | POA: Diagnosis not present

## 2024-05-31 DIAGNOSIS — E785 Hyperlipidemia, unspecified: Secondary | ICD-10-CM | POA: Diagnosis not present

## 2024-05-31 DIAGNOSIS — I714 Abdominal aortic aneurysm, without rupture, unspecified: Secondary | ICD-10-CM | POA: Diagnosis not present

## 2024-05-31 DIAGNOSIS — I201 Angina pectoris with documented spasm: Secondary | ICD-10-CM

## 2024-05-31 DIAGNOSIS — I77819 Aortic ectasia, unspecified site: Secondary | ICD-10-CM

## 2024-05-31 NOTE — Patient Instructions (Signed)
 Medication Instructions:  Continue same medications *If you need a refill on your cardiac medications before your next appointment, please call your pharmacy*  Lab Work: None ordered  Testing/Procedures: Abdominal U/S  first available  Schedule Echo  05/2025   Follow-Up: At [facility], you and your health needs are our priority.  As part of our continuing mission to provide you with exceptional heart care, our providers are all part of one team.  This team includes your primary Cardiologist (physician) and Advanced Practice Providers or APPs (Physician Assistants and Nurse Practitioners) who all work together to provide you with the care you need, when you need it.  Your next appointment:  1 year    Call in August to schedule Dec appointment     Provider:  Glendia Ferrier PA    We recommend signing up for the patient portal called MyChart.  Sign up information is provided on this After Visit Summary.  MyChart is used to connect with patients for Virtual Visits (Telemedicine).  Patients are able to view lab/test results, encounter notes, upcoming appointments, etc.  Non-urgent messages can be sent to your provider as well.   To learn more about what you can do with MyChart, go to [email or web address].

## 2024-06-04 ENCOUNTER — Ambulatory Visit (HOSPITAL_COMMUNITY): Admission: RE | Admit: 2024-06-04 | Discharge: 2024-06-04 | Attending: Internal Medicine

## 2024-06-04 ENCOUNTER — Ambulatory Visit: Payer: Self-pay | Admitting: Internal Medicine

## 2024-06-04 DIAGNOSIS — I714 Abdominal aortic aneurysm, without rupture, unspecified: Secondary | ICD-10-CM | POA: Insufficient documentation

## 2024-06-12 ENCOUNTER — Other Ambulatory Visit: Payer: Self-pay | Admitting: Family Medicine

## 2024-06-12 DIAGNOSIS — N400 Enlarged prostate without lower urinary tract symptoms: Secondary | ICD-10-CM

## 2024-06-12 DIAGNOSIS — E785 Hyperlipidemia, unspecified: Secondary | ICD-10-CM

## 2024-06-12 DIAGNOSIS — E039 Hypothyroidism, unspecified: Secondary | ICD-10-CM

## 2024-06-19 ENCOUNTER — Other Ambulatory Visit: Payer: Self-pay | Admitting: Family Medicine

## 2024-06-19 DIAGNOSIS — E291 Testicular hypofunction: Secondary | ICD-10-CM

## 2024-06-19 DIAGNOSIS — I10 Essential (primary) hypertension: Secondary | ICD-10-CM

## 2024-06-19 NOTE — Telephone Encounter (Signed)
 Requesting: Testosterone  30 MG Contract: None UDS: 04/18/2023 Last Visit: 04/18/2024 Next Visit: 10/21/2024 Last Refill: 04/18/2024  Please Advise

## 2024-10-08 ENCOUNTER — Ambulatory Visit

## 2024-10-21 ENCOUNTER — Ambulatory Visit: Admitting: Family Medicine

## 2025-01-06 ENCOUNTER — Encounter: Admitting: Family Medicine

## 2025-05-28 ENCOUNTER — Ambulatory Visit (HOSPITAL_COMMUNITY)
